# Patient Record
Sex: Female | Born: 1940 | Race: White | Hispanic: No | Marital: Married | Smoking: Never smoker
Health system: Southern US, Community
[De-identification: ages and names within clinical notes are randomized; demographics above are authoritative.]

---

## 2012-05-06 ENCOUNTER — Other Ambulatory Visit: Payer: Self-pay | Admitting: Family Medicine

## 2012-05-06 DIAGNOSIS — Z1231 Encounter for screening mammogram for malignant neoplasm of breast: Secondary | ICD-10-CM

## 2012-05-27 ENCOUNTER — Ambulatory Visit
Admission: RE | Admit: 2012-05-27 | Discharge: 2012-05-27 | Disposition: A | Discharge status: Home / Self Care | Payer: Medicare Other | Source: Ambulatory Visit | Attending: Family Medicine | Admitting: Family Medicine

## 2012-05-27 DIAGNOSIS — Z1231 Encounter for screening mammogram for malignant neoplasm of breast: Secondary | ICD-10-CM

## 2013-05-29 ENCOUNTER — Other Ambulatory Visit: Payer: Self-pay

## 2013-05-29 DIAGNOSIS — Z1231 Encounter for screening mammogram for malignant neoplasm of breast: Secondary | ICD-10-CM

## 2013-06-04 ENCOUNTER — Ambulatory Visit
Admission: RE | Admit: 2013-06-04 | Discharge: 2013-06-04 | Disposition: A | Discharge status: Home / Self Care | Payer: Medicare Other | Source: Ambulatory Visit

## 2013-06-04 DIAGNOSIS — Z1231 Encounter for screening mammogram for malignant neoplasm of breast: Secondary | ICD-10-CM

## 2015-12-19 ENCOUNTER — Other Ambulatory Visit: Payer: Self-pay | Admitting: Family Medicine

## 2015-12-19 ENCOUNTER — Other Ambulatory Visit: Payer: Self-pay

## 2015-12-19 DIAGNOSIS — Z1231 Encounter for screening mammogram for malignant neoplasm of breast: Secondary | ICD-10-CM

## 2015-12-28 ENCOUNTER — Ambulatory Visit
Admission: RE | Admit: 2015-12-28 | Discharge: 2015-12-28 | Disposition: A | Discharge status: Home / Self Care | Payer: Medicare Other | Source: Ambulatory Visit | Attending: Family Medicine | Admitting: Family Medicine

## 2015-12-28 DIAGNOSIS — Z1231 Encounter for screening mammogram for malignant neoplasm of breast: Secondary | ICD-10-CM

## 2016-11-12 ENCOUNTER — Other Ambulatory Visit: Payer: Self-pay | Admitting: Family Medicine

## 2016-11-12 DIAGNOSIS — M818 Other osteoporosis without current pathological fracture: Secondary | ICD-10-CM

## 2016-11-22 ENCOUNTER — Ambulatory Visit
Admission: RE | Admit: 2016-11-22 | Discharge: 2016-11-22 | Disposition: A | Discharge status: Home / Self Care | Payer: Medicare Other | Source: Ambulatory Visit | Attending: Family Medicine | Admitting: Family Medicine

## 2016-11-22 DIAGNOSIS — M818 Other osteoporosis without current pathological fracture: Secondary | ICD-10-CM

## 2019-07-10 ENCOUNTER — Other Ambulatory Visit: Payer: Self-pay | Admitting: Family Medicine

## 2019-07-10 DIAGNOSIS — Z1231 Encounter for screening mammogram for malignant neoplasm of breast: Secondary | ICD-10-CM

## 2019-07-21 ENCOUNTER — Other Ambulatory Visit: Payer: Self-pay | Admitting: Family Medicine

## 2019-07-21 DIAGNOSIS — M8580 Other specified disorders of bone density and structure, unspecified site: Secondary | ICD-10-CM

## 2019-08-24 ENCOUNTER — Other Ambulatory Visit: Payer: Self-pay | Admitting: Family Medicine

## 2019-08-24 DIAGNOSIS — M8000XA Age-related osteoporosis with current pathological fracture, unspecified site, initial encounter for fracture: Secondary | ICD-10-CM

## 2019-08-26 ENCOUNTER — Ambulatory Visit
Admission: RE | Admit: 2019-08-26 | Discharge: 2019-08-26 | Disposition: A | Discharge status: Home / Self Care | Payer: Medicare Other | Source: Ambulatory Visit | Attending: Family Medicine | Admitting: Family Medicine

## 2019-08-26 ENCOUNTER — Other Ambulatory Visit: Payer: Self-pay

## 2019-08-26 DIAGNOSIS — Z1231 Encounter for screening mammogram for malignant neoplasm of breast: Secondary | ICD-10-CM

## 2019-08-26 DIAGNOSIS — M8000XA Age-related osteoporosis with current pathological fracture, unspecified site, initial encounter for fracture: Secondary | ICD-10-CM

## 2020-01-22 ENCOUNTER — Ambulatory Visit: Payer: Self-pay | Attending: Internal Medicine

## 2020-01-22 ENCOUNTER — Other Ambulatory Visit: Payer: Self-pay

## 2020-01-22 DIAGNOSIS — Z23 Encounter for immunization: Secondary | ICD-10-CM | POA: Insufficient documentation

## 2020-01-22 NOTE — Progress Notes (Signed)
   Covid-19 Vaccination Clinic  Name:  Pamela Johnston    MRN: [id number] DOB: 02-09-1941  01/22/2020  [name] was observed post Covid-19 immunization for 15 minutes without incidence. She was provided with Vaccine Information Sheet and instruction to access the V-Safe system.   [name] was instructed to call 911 with any severe reactions post vaccine: Marland Kitchen Difficulty breathing  . Swelling of your face and throat  . A fast heartbeat  . A bad rash all over your body  . Dizziness and weakness    Immunizations Administered    Name Date Dose VIS Date Route   Pfizer COVID-19 Vaccine 01/22/2020  2:34 PM 0.3 mL 11/20/2019 Intramuscular   Manufacturer: [employer], [employer]   Lot: [id number]   NDC: [address]-0

## 2020-02-13 ENCOUNTER — Ambulatory Visit: Payer: Medicare PPO | Attending: Internal Medicine

## 2020-02-13 DIAGNOSIS — Z23 Encounter for immunization: Secondary | ICD-10-CM | POA: Insufficient documentation

## 2020-02-13 NOTE — Progress Notes (Signed)
   Covid-19 Vaccination Clinic  Name:  Pamela Johnston    MRN: [id number] DOB: 1941/05/16  02/13/2020  Pamela Johnston was observed post Covid-19 immunization for 15 minutes without incident. She was provided with Vaccine Information Sheet and instruction to access the V-Safe system.   Pamela Johnston was instructed to call 911 with any severe reactions post vaccine: Marland Kitchen Difficulty breathing  . Swelling of face and throat  . A fast heartbeat  . A bad rash all over body  . Dizziness and weakness   Immunizations Administered    Name Date Dose VIS Date Route   Pfizer COVID-19 Vaccine 02/13/2020  3:19 PM 0.3 mL 11/20/2019 Intramuscular   Manufacturer: [employer], [employer]   Lot: [id number]   NDC: [address]-3

## 2020-02-14 ENCOUNTER — Ambulatory Visit: Payer: Self-pay

## 2020-07-08 IMAGING — MG MM DIGITAL SCREENING BILAT W/ TOMO W/ CAD
8 series · 8 of 24 positions shown · non-contrast
Comparison: Previous exam(s).

CLINICAL DATA: Screening.

EXAM:
DIGITAL SCREENING BILATERAL MAMMOGRAM WITH TOMO AND CAD

[R MLO synth-2D]
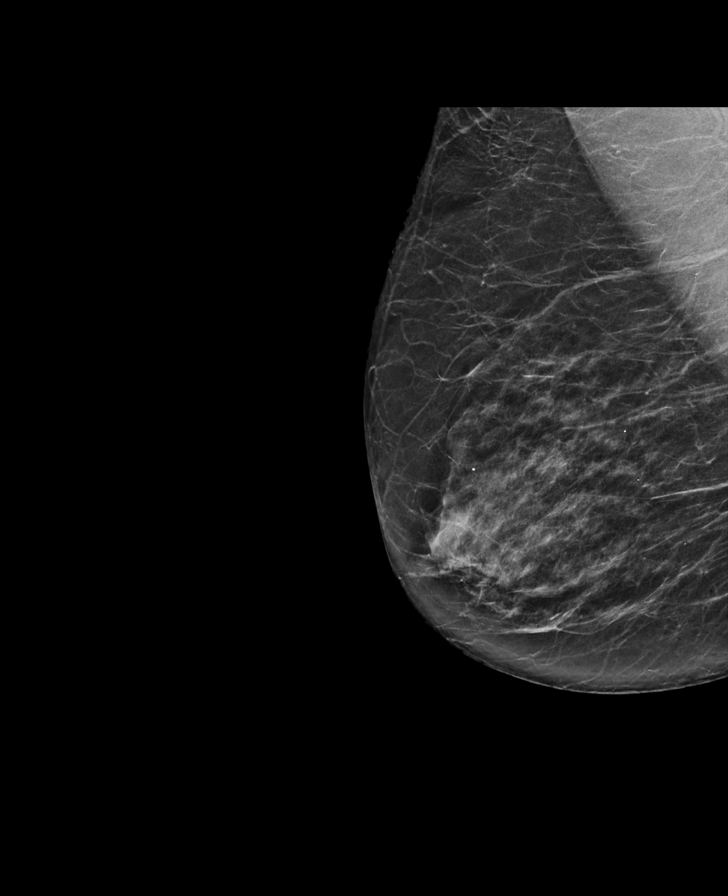

[L MLO synth-2D]
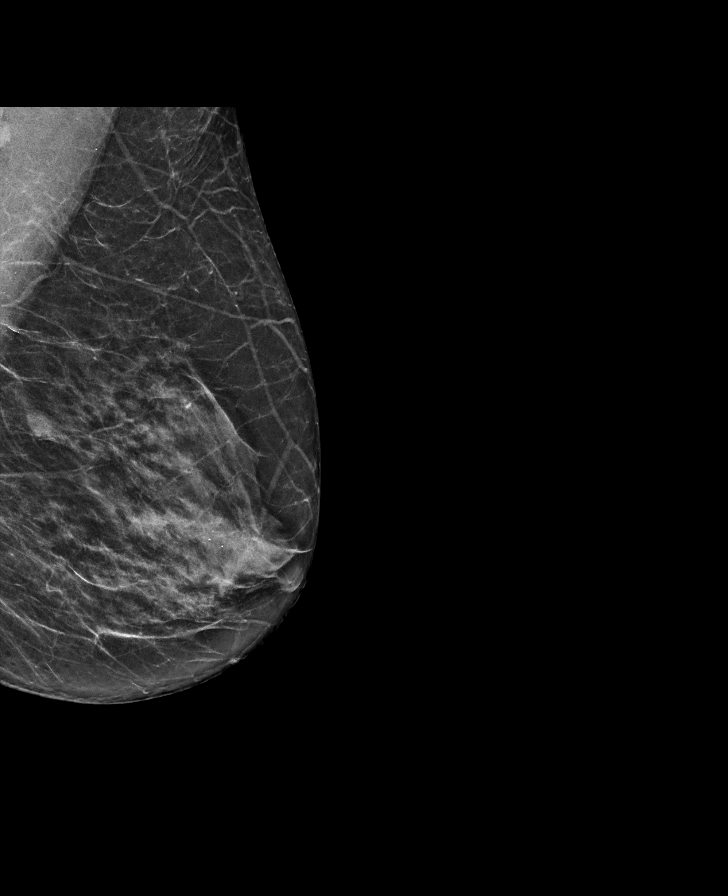

[L CC synth-2D]
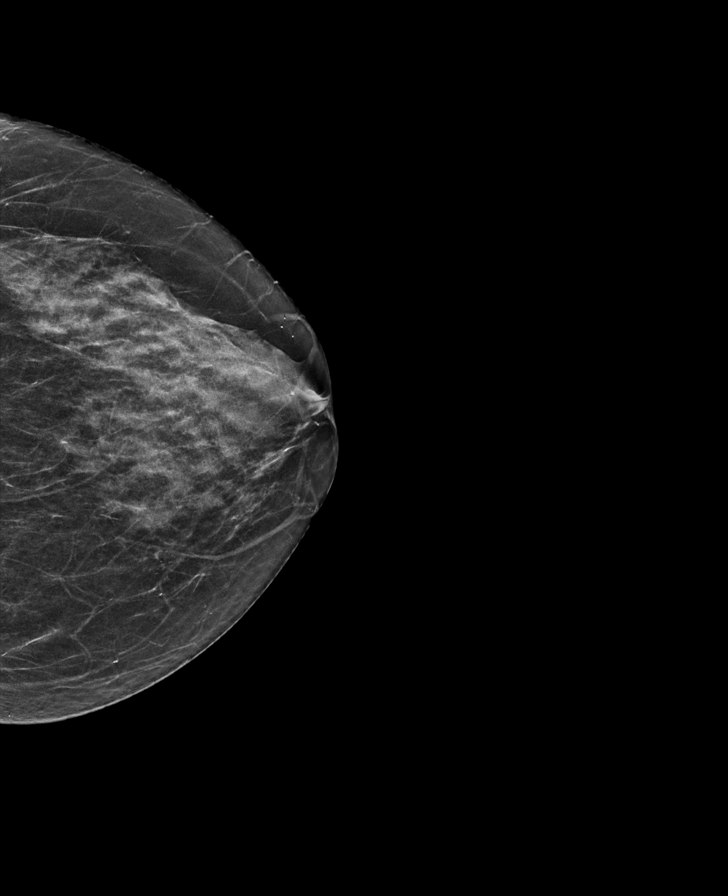

[R CC synth-2D]
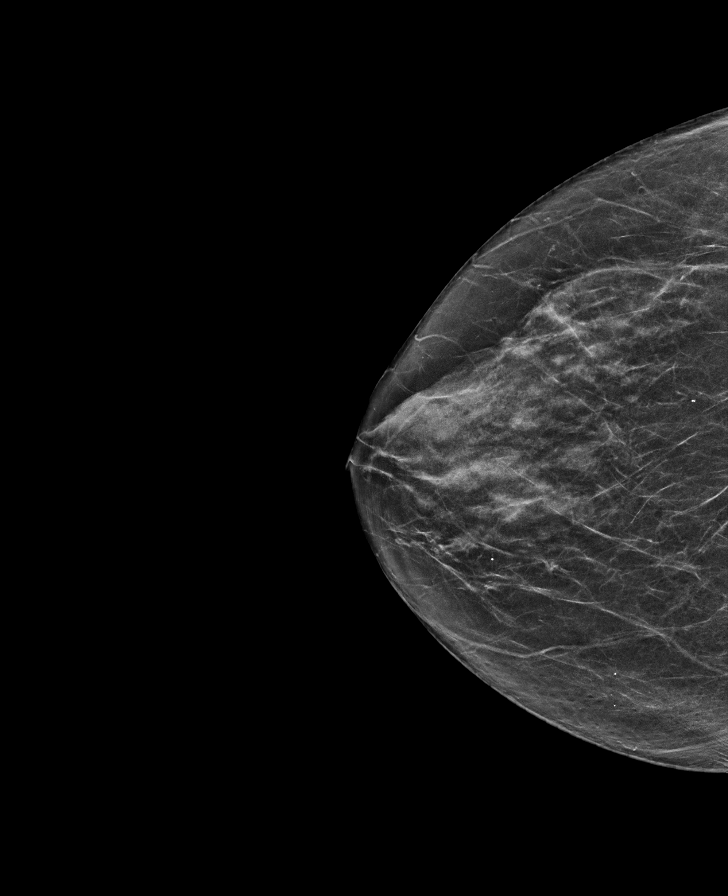

[L MLO tomo · tomo slice 30/59.0]
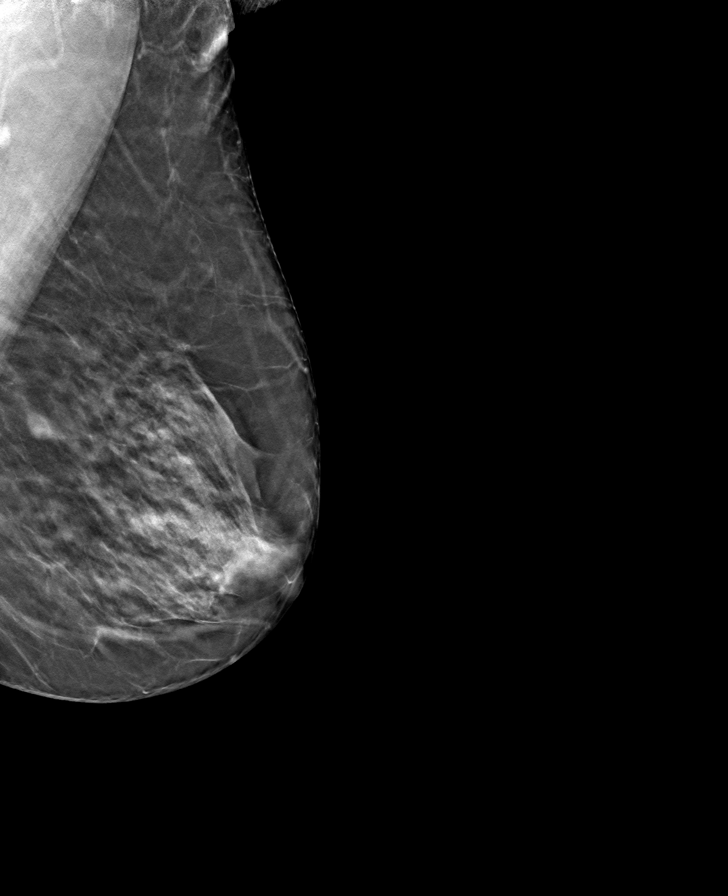

[L CC tomo · tomo slice 25/50.0]
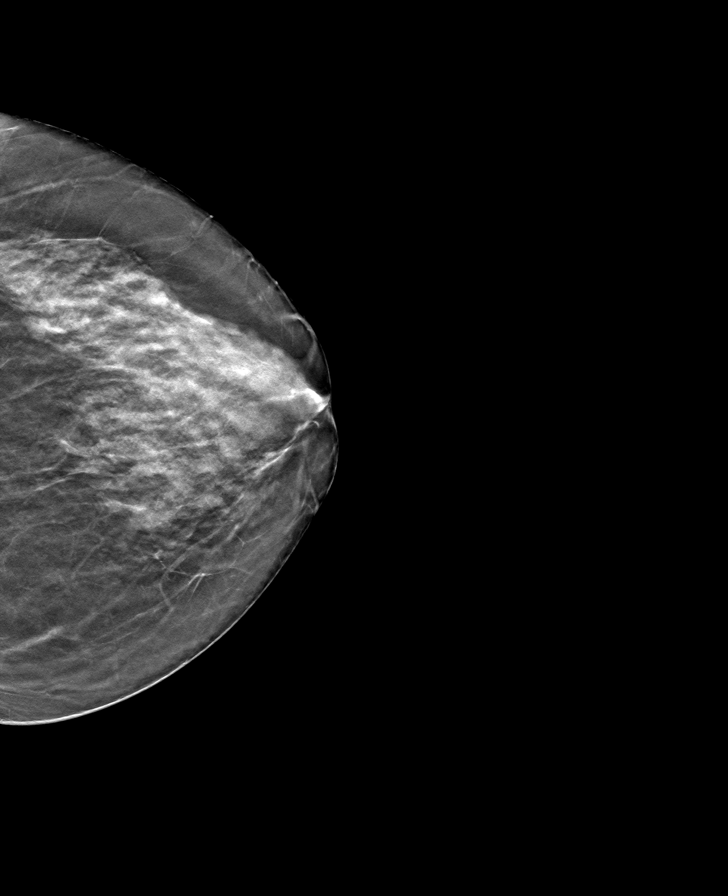

[R CC tomo · tomo slice 31/61.0]
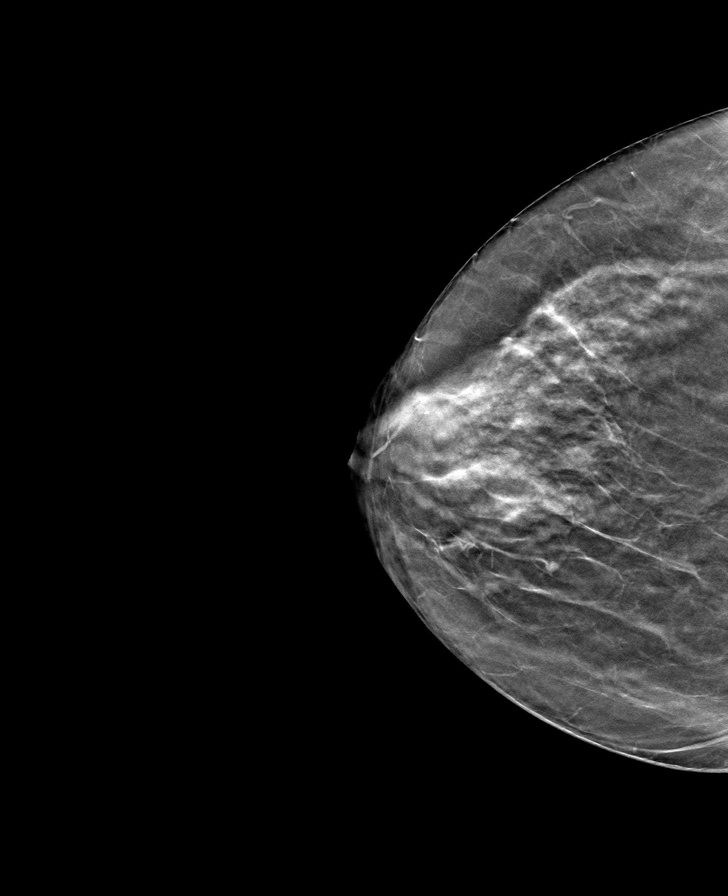

[R MLO tomo · tomo slice 31/62.0]
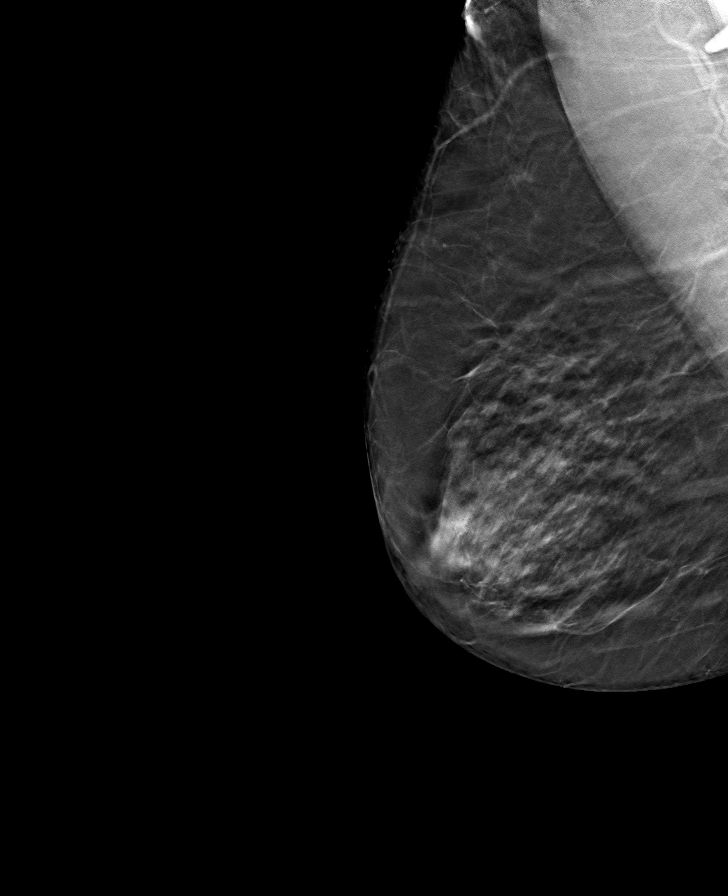

[8 of 24 positions shown; findings below may reference images not displayed]

ACR Breast Density Category c: The breast tissue is heterogeneously
dense, which may obscure small masses.
FINDINGS: There are no findings suspicious for malignancy. Images were
processed with CAD.
IMPRESSION: No mammographic evidence of malignancy. A result letter of this
screening mammogram will be mailed directly to the patient.

RECOMMENDATION:
Screening mammogram in one year. (Code:[id number])

BI-RADS CATEGORY  1: Negative.

## 2022-10-24 ENCOUNTER — Other Ambulatory Visit: Payer: Self-pay | Admitting: Family Medicine

## 2022-10-24 DIAGNOSIS — Z1231 Encounter for screening mammogram for malignant neoplasm of breast: Secondary | ICD-10-CM

## 2022-10-24 DIAGNOSIS — M8580 Other specified disorders of bone density and structure, unspecified site: Secondary | ICD-10-CM

## 2022-11-29 ENCOUNTER — Inpatient Hospital Stay (HOSPITAL_COMMUNITY)
Admission: EM | Admit: 2022-11-29 | Discharge: 2023-02-27 | DRG: 004 | Disposition: A | Discharge status: Skilled Nursing Facility | Payer: Medicare PPO | Attending: Internal Medicine | Admitting: Internal Medicine

## 2022-11-29 ENCOUNTER — Emergency Department (HOSPITAL_COMMUNITY): Payer: Medicare PPO

## 2022-11-29 ENCOUNTER — Encounter (HOSPITAL_COMMUNITY): Payer: Self-pay | Admitting: Neurology

## 2022-11-29 ENCOUNTER — Inpatient Hospital Stay (HOSPITAL_COMMUNITY): Payer: Medicare PPO

## 2022-11-29 DIAGNOSIS — Y848 Other medical procedures as the cause of abnormal reaction of the patient, or of later complication, without mention of misadventure at the time of the procedure: Secondary | ICD-10-CM | POA: Diagnosis not present

## 2022-11-29 DIAGNOSIS — R54 Age-related physical debility: Secondary | ICD-10-CM | POA: Diagnosis present

## 2022-11-29 DIAGNOSIS — Z1613 Resistance to carbapenem: Secondary | ICD-10-CM | POA: Diagnosis not present

## 2022-11-29 DIAGNOSIS — R509 Fever, unspecified: Secondary | ICD-10-CM | POA: Diagnosis not present

## 2022-11-29 DIAGNOSIS — I615 Nontraumatic intracerebral hemorrhage, intraventricular: Secondary | ICD-10-CM | POA: Diagnosis present

## 2022-11-29 DIAGNOSIS — I611 Nontraumatic intracerebral hemorrhage in hemisphere, cortical: Secondary | ICD-10-CM | POA: Diagnosis not present

## 2022-11-29 DIAGNOSIS — R29722 NIHSS score 22: Secondary | ICD-10-CM | POA: Diagnosis present

## 2022-11-29 DIAGNOSIS — M6250 Muscle wasting and atrophy, not elsewhere classified, unspecified site: Secondary | ICD-10-CM | POA: Diagnosis not present

## 2022-11-29 DIAGNOSIS — J150 Pneumonia due to Klebsiella pneumoniae: Secondary | ICD-10-CM | POA: Diagnosis not present

## 2022-11-29 DIAGNOSIS — Y95 Nosocomial condition: Secondary | ICD-10-CM | POA: Diagnosis not present

## 2022-11-29 DIAGNOSIS — I618 Other nontraumatic intracerebral hemorrhage: Secondary | ICD-10-CM | POA: Diagnosis not present

## 2022-11-29 DIAGNOSIS — E8770 Fluid overload, unspecified: Secondary | ICD-10-CM | POA: Diagnosis not present

## 2022-11-29 DIAGNOSIS — B9561 Methicillin susceptible Staphylococcus aureus infection as the cause of diseases classified elsewhere: Secondary | ICD-10-CM | POA: Diagnosis present

## 2022-11-29 DIAGNOSIS — I612 Nontraumatic intracerebral hemorrhage in hemisphere, unspecified: Secondary | ICD-10-CM | POA: Diagnosis not present

## 2022-11-29 DIAGNOSIS — R739 Hyperglycemia, unspecified: Secondary | ICD-10-CM | POA: Diagnosis not present

## 2022-11-29 DIAGNOSIS — G935 Compression of brain: Secondary | ICD-10-CM | POA: Diagnosis present

## 2022-11-29 DIAGNOSIS — J9611 Chronic respiratory failure with hypoxia: Secondary | ICD-10-CM | POA: Diagnosis not present

## 2022-11-29 DIAGNOSIS — G936 Cerebral edema: Secondary | ICD-10-CM | POA: Diagnosis present

## 2022-11-29 DIAGNOSIS — D6489 Other specified anemias: Secondary | ICD-10-CM | POA: Diagnosis not present

## 2022-11-29 DIAGNOSIS — Z9911 Dependence on respirator [ventilator] status: Secondary | ICD-10-CM

## 2022-11-29 DIAGNOSIS — I6389 Other cerebral infarction: Secondary | ICD-10-CM | POA: Diagnosis not present

## 2022-11-29 DIAGNOSIS — M8580 Other specified disorders of bone density and structure, unspecified site: Secondary | ICD-10-CM | POA: Insufficient documentation

## 2022-11-29 DIAGNOSIS — M461 Sacroiliitis, not elsewhere classified: Secondary | ICD-10-CM

## 2022-11-29 DIAGNOSIS — L89512 Pressure ulcer of right ankle, stage 2: Secondary | ICD-10-CM | POA: Diagnosis not present

## 2022-11-29 DIAGNOSIS — Z751 Person awaiting admission to adequate facility elsewhere: Secondary | ICD-10-CM

## 2022-11-29 DIAGNOSIS — J9811 Atelectasis: Secondary | ICD-10-CM | POA: Diagnosis not present

## 2022-11-29 DIAGNOSIS — Z931 Gastrostomy status: Secondary | ICD-10-CM

## 2022-11-29 DIAGNOSIS — J90 Pleural effusion, not elsewhere classified: Secondary | ICD-10-CM | POA: Diagnosis not present

## 2022-11-29 DIAGNOSIS — T17908A Unspecified foreign body in respiratory tract, part unspecified causing other injury, initial encounter: Secondary | ICD-10-CM

## 2022-11-29 DIAGNOSIS — I619 Nontraumatic intracerebral hemorrhage, unspecified: Principal | ICD-10-CM

## 2022-11-29 DIAGNOSIS — R0902 Hypoxemia: Secondary | ICD-10-CM

## 2022-11-29 DIAGNOSIS — R579 Shock, unspecified: Secondary | ICD-10-CM | POA: Diagnosis not present

## 2022-11-29 DIAGNOSIS — R1310 Dysphagia, unspecified: Secondary | ICD-10-CM | POA: Diagnosis not present

## 2022-11-29 DIAGNOSIS — G9349 Other encephalopathy: Secondary | ICD-10-CM | POA: Diagnosis not present

## 2022-11-29 DIAGNOSIS — R403 Persistent vegetative state: Secondary | ICD-10-CM | POA: Diagnosis not present

## 2022-11-29 DIAGNOSIS — Z888 Allergy status to other drugs, medicaments and biological substances status: Secondary | ICD-10-CM

## 2022-11-29 DIAGNOSIS — I10 Essential (primary) hypertension: Secondary | ICD-10-CM | POA: Insufficient documentation

## 2022-11-29 DIAGNOSIS — Z930 Tracheostomy status: Secondary | ICD-10-CM

## 2022-11-29 DIAGNOSIS — Z515 Encounter for palliative care: Secondary | ICD-10-CM | POA: Diagnosis not present

## 2022-11-29 DIAGNOSIS — R251 Tremor, unspecified: Secondary | ICD-10-CM | POA: Diagnosis not present

## 2022-11-29 DIAGNOSIS — G2581 Restless legs syndrome: Secondary | ICD-10-CM | POA: Diagnosis present

## 2022-11-29 DIAGNOSIS — I471 Supraventricular tachycardia, unspecified: Secondary | ICD-10-CM | POA: Diagnosis not present

## 2022-11-29 DIAGNOSIS — Z2239 Carrier of other specified bacterial diseases: Secondary | ICD-10-CM

## 2022-11-29 DIAGNOSIS — L8990 Pressure ulcer of unspecified site, unspecified stage: Secondary | ICD-10-CM | POA: Insufficient documentation

## 2022-11-29 DIAGNOSIS — Z1624 Resistance to multiple antibiotics: Secondary | ICD-10-CM | POA: Diagnosis not present

## 2022-11-29 DIAGNOSIS — E871 Hypo-osmolality and hyponatremia: Secondary | ICD-10-CM | POA: Diagnosis not present

## 2022-11-29 DIAGNOSIS — I616 Nontraumatic intracerebral hemorrhage, multiple localized: Secondary | ICD-10-CM | POA: Diagnosis not present

## 2022-11-29 DIAGNOSIS — L89891 Pressure ulcer of other site, stage 1: Secondary | ICD-10-CM | POA: Diagnosis not present

## 2022-11-29 DIAGNOSIS — J189 Pneumonia, unspecified organism: Secondary | ICD-10-CM | POA: Diagnosis not present

## 2022-11-29 DIAGNOSIS — Z880 Allergy status to penicillin: Secondary | ICD-10-CM

## 2022-11-29 DIAGNOSIS — L89150 Pressure ulcer of sacral region, unstageable: Secondary | ICD-10-CM | POA: Diagnosis not present

## 2022-11-29 DIAGNOSIS — J95851 Ventilator associated pneumonia: Secondary | ICD-10-CM | POA: Diagnosis not present

## 2022-11-29 DIAGNOSIS — R4701 Aphasia: Secondary | ICD-10-CM | POA: Diagnosis present

## 2022-11-29 DIAGNOSIS — G919 Hydrocephalus, unspecified: Secondary | ICD-10-CM | POA: Diagnosis present

## 2022-11-29 DIAGNOSIS — G40909 Epilepsy, unspecified, not intractable, without status epilepticus: Secondary | ICD-10-CM | POA: Diagnosis not present

## 2022-11-29 DIAGNOSIS — E785 Hyperlipidemia, unspecified: Secondary | ICD-10-CM | POA: Diagnosis present

## 2022-11-29 DIAGNOSIS — E876 Hypokalemia: Secondary | ICD-10-CM | POA: Diagnosis not present

## 2022-11-29 DIAGNOSIS — G8191 Hemiplegia, unspecified affecting right dominant side: Secondary | ICD-10-CM | POA: Diagnosis present

## 2022-11-29 DIAGNOSIS — J690 Pneumonitis due to inhalation of food and vomit: Secondary | ICD-10-CM | POA: Diagnosis present

## 2022-11-29 DIAGNOSIS — Z7189 Other specified counseling: Secondary | ICD-10-CM | POA: Diagnosis not present

## 2022-11-29 DIAGNOSIS — N390 Urinary tract infection, site not specified: Secondary | ICD-10-CM | POA: Diagnosis not present

## 2022-11-29 DIAGNOSIS — R609 Edema, unspecified: Secondary | ICD-10-CM | POA: Diagnosis not present

## 2022-11-29 DIAGNOSIS — J1569 Pneumonia due to other gram-negative bacteria: Secondary | ICD-10-CM | POA: Diagnosis not present

## 2022-11-29 DIAGNOSIS — L89812 Pressure ulcer of head, stage 2: Secondary | ICD-10-CM | POA: Diagnosis not present

## 2022-11-29 DIAGNOSIS — J9601 Acute respiratory failure with hypoxia: Secondary | ICD-10-CM | POA: Diagnosis not present

## 2022-11-29 DIAGNOSIS — R258 Other abnormal involuntary movements: Secondary | ICD-10-CM | POA: Diagnosis not present

## 2022-11-29 DIAGNOSIS — Z79899 Other long term (current) drug therapy: Secondary | ICD-10-CM

## 2022-11-29 DIAGNOSIS — R569 Unspecified convulsions: Secondary | ICD-10-CM | POA: Diagnosis not present

## 2022-11-29 HISTORY — DX: Sacroiliitis, not elsewhere classified: M46.1

## 2022-11-29 HISTORY — DX: Other specified disorders of bone density and structure, unspecified site: M85.80

## 2022-11-29 HISTORY — DX: Essential (primary) hypertension: I10

## 2022-11-29 HISTORY — DX: Restless legs syndrome: G25.81

## 2022-11-29 LAB — CBC
HCT: 40.4 % (ref 36.0–46.0)
Hemoglobin: 13.9 g/dL (ref 12.0–15.0)
MCH: 32 pg (ref 26.0–34.0)
MCHC: 34.4 g/dL (ref 30.0–36.0)
MCV: 92.9 fL (ref 80.0–100.0)
Platelets: 216 10*3/uL (ref 150–400)
RBC: 4.35 MIL/uL (ref 3.87–5.11)
RDW: 12.1 % (ref 11.5–15.5)
WBC: 12 10*3/uL — ABNORMAL HIGH (ref 4.0–10.5)
nRBC: 0 % (ref 0.0–0.2)

## 2022-11-29 LAB — COMPREHENSIVE METABOLIC PANEL
ALT: 16 U/L (ref 0–44)
AST: 40 U/L (ref 15–41)
Albumin: 4.3 g/dL (ref 3.5–5.0)
Alkaline Phosphatase: 59 U/L (ref 38–126)
Anion gap: 14 (ref 5–15)
BUN: 12 mg/dL (ref 8–23)
CO2: 21 mmol/L — ABNORMAL LOW (ref 22–32)
Calcium: 9.8 mg/dL (ref 8.9–10.3)
Chloride: 100 mmol/L (ref 98–111)
Creatinine, Ser: 0.96 mg/dL (ref 0.44–1.00)
GFR, Estimated: >60 mL/min (ref >60)
Glucose, Bld: 154 mg/dL — ABNORMAL HIGH (ref 70–99)
Potassium: 3.5 mmol/L (ref 3.5–5.1)
Sodium: 135 mmol/L (ref 135–145)
Total Bilirubin: 0.3 mg/dL (ref 0.3–1.2)
Total Protein: 7.4 g/dL (ref 6.5–8.1)

## 2022-11-29 LAB — I-STAT CHEM 8, ED
BUN: 13 mg/dL (ref 8–23)
Calcium, Ion: 1.12 mmol/L — ABNORMAL LOW (ref 1.15–1.40)
Chloride: 98 mmol/L (ref 98–111)
Creatinine, Ser: 0.8 mg/dL (ref 0.44–1.00)
Glucose, Bld: 153 mg/dL — ABNORMAL HIGH (ref 70–99)
HCT: 43 % (ref 36.0–46.0)
Hemoglobin: 14.6 g/dL (ref 12.0–15.0)
Potassium: 3.7 mmol/L (ref 3.5–5.1)
Sodium: 134 mmol/L — ABNORMAL LOW (ref 135–145)
TCO2: 22 mmol/L (ref 22–32)

## 2022-11-29 LAB — I-STAT ARTERIAL BLOOD GAS, ED
Acid-Base Excess: 1 mmol/L (ref 0.0–2.0)
Bicarbonate: 25.8 mmol/L (ref 20.0–28.0)
Calcium, Ion: 1.19 mmol/L (ref 1.15–1.40)
HCT: 41 % (ref 36.0–46.0)
Hemoglobin: 13.9 g/dL (ref 12.0–15.0)
O2 Saturation: 99 %
Patient temperature: 97.7
Potassium: 3.4 mmol/L — ABNORMAL LOW (ref 3.5–5.1)
Sodium: 134 mmol/L — ABNORMAL LOW (ref 135–145)
TCO2: 27 mmol/L (ref 22–32)
pCO2 arterial: 41.6 mmHg (ref 32–48)
pH, Arterial: 7.398 (ref 7.35–7.45)
pO2, Arterial: 152 mmHg — ABNORMAL HIGH (ref 83–108)

## 2022-11-29 LAB — DIFFERENTIAL
Abs Immature Granulocytes: 0.04 10*3/uL (ref 0.00–0.07)
Basophils Absolute: 0.1 10*3/uL (ref 0.0–0.1)
Basophils Relative: 1 %
Eosinophils Absolute: 0.3 10*3/uL (ref 0.0–0.5)
Eosinophils Relative: 2 %
Immature Granulocytes: 0 %
Lymphocytes Relative: 49 %
Lymphs Abs: 5.9 10*3/uL — ABNORMAL HIGH (ref 0.7–4.0)
Monocytes Absolute: 1 10*3/uL (ref 0.1–1.0)
Monocytes Relative: 8 %
Neutro Abs: 4.8 10*3/uL (ref 1.7–7.7)
Neutrophils Relative %: 40 %

## 2022-11-29 LAB — CBG MONITORING, ED: Glucose-Capillary: 169 mg/dL — ABNORMAL HIGH (ref 70–99)

## 2022-11-29 LAB — APTT: aPTT: 23 seconds — ABNORMAL LOW (ref 24–36)

## 2022-11-29 LAB — PROTIME-INR
INR: 1 (ref 0.8–1.2)
Prothrombin Time: 12.7 seconds (ref 11.4–15.2)

## 2022-11-29 LAB — ETHANOL: Alcohol, Ethyl (B): <10 mg/dL (ref <10)

## 2022-11-29 MED ORDER — ETOMIDATE 2 MG/ML IV SOLN
INTRAVENOUS | Status: AC
  Filled 2022-11-29: qty 20

## 2022-11-29 MED ORDER — STROKE: EARLY STAGES OF RECOVERY BOOK: Freq: Once | Status: AC

## 2022-11-29 MED ORDER — ROCURONIUM BROMIDE 50 MG/5ML IV SOLN
INTRAVENOUS | Status: DC | PRN
  Administered 2022-11-29: 100 mg via INTRAVENOUS

## 2022-11-29 MED ORDER — SENNOSIDES-DOCUSATE SODIUM 8.6-50 MG PO TABS: 1.0000 | ORAL_TABLET | Freq: Two times a day (BID) | ORAL | Status: DC

## 2022-11-29 MED ORDER — ETOMIDATE 2 MG/ML IV SOLN
INTRAVENOUS | Status: DC | PRN
  Administered 2022-11-29: 20 mg via INTRAVENOUS

## 2022-11-29 MED ORDER — ACETAMINOPHEN 325 MG PO TABS
650.0000 mg | ORAL_TABLET | ORAL | Status: DC | PRN
  Administered 2023-01-22 – 2023-02-18 (×7): 650 mg via ORAL
  Filled 2022-11-29 (×12): qty 2

## 2022-11-29 MED ORDER — ROCURONIUM BROMIDE 10 MG/ML (PF) SYRINGE
PREFILLED_SYRINGE | INTRAVENOUS | Status: AC
  Filled 2022-11-29: qty 10

## 2022-11-29 MED ORDER — SODIUM CHLORIDE 0.9 % IV SOLN
3.0000 g | Freq: Four times a day (QID) | INTRAVENOUS | Status: DC
  Administered 2022-11-30 – 2022-12-04 (×18): 3 g via INTRAVENOUS
  Filled 2022-11-29 (×18): qty 8

## 2022-11-29 MED ORDER — FENTANYL 2500MCG IN NS 250ML (10MCG/ML) PREMIX INFUSION
25.0000 ug/h | INTRAVENOUS | Status: DC
  Administered 2022-11-29 – 2022-11-30 (×2): 50 ug/h via INTRAVENOUS
  Administered 2022-12-04 – 2022-12-08 (×4): 25 ug/h via INTRAVENOUS
  Administered 2022-12-10 – 2022-12-12 (×2): 50 ug/h via INTRAVENOUS
  Filled 2022-11-29 (×6): qty 250

## 2022-11-29 MED ORDER — ACETAMINOPHEN 650 MG RE SUPP: 650.0000 mg | RECTAL | Status: DC | PRN

## 2022-11-29 MED ORDER — SODIUM CHLORIDE 0.9% FLUSH
3.0000 mL | Freq: Once | INTRAVENOUS | Status: AC
  Administered 2022-11-29: 3 mL via INTRAVENOUS

## 2022-11-29 MED ORDER — SODIUM CHLORIDE 0.9 % IV BOLUS
500.0000 mL | Freq: Once | INTRAVENOUS | Status: AC
  Administered 2022-11-29: 500 mL via INTRAVENOUS

## 2022-11-29 MED ORDER — CLEVIDIPINE BUTYRATE 0.5 MG/ML IV EMUL
0.0000 mg/h | INTRAVENOUS | Status: DC
  Administered 2022-11-29: 2 mg/h via INTRAVENOUS
  Filled 2022-11-29: qty 100

## 2022-11-29 MED ORDER — ONDANSETRON HCL 4 MG/2ML IJ SOLN
4.0000 mg | Freq: Once | INTRAMUSCULAR | Status: AC
  Administered 2022-11-29: 4 mg via INTRAVENOUS

## 2022-11-29 MED ORDER — PROPOFOL 1000 MG/100ML IV EMUL
0.0000 ug/kg/min | INTRAVENOUS | Status: DC
  Administered 2022-11-29: 15 ug/kg/min via INTRAVENOUS
  Filled 2022-11-29: qty 100

## 2022-11-29 MED ORDER — SODIUM CHLORIDE 3 % IV SOLN
INTRAVENOUS | Status: DC
  Filled 2022-11-29 (×8): qty 500

## 2022-11-29 MED ORDER — FENTANYL BOLUS VIA INFUSION
25.0000 ug | INTRAVENOUS | Status: DC | PRN
  Administered 2022-11-30: 100 ug via INTRAVENOUS
  Administered 2022-11-30: 50 ug via INTRAVENOUS
  Administered 2022-12-01 – 2022-12-06 (×2): 100 ug via INTRAVENOUS

## 2022-11-29 MED ORDER — IOHEXOL 350 MG/ML SOLN
75.0000 mL | Freq: Once | INTRAVENOUS | Status: AC | PRN
  Administered 2022-11-29: 75 mL via INTRAVENOUS

## 2022-11-29 MED ORDER — PANTOPRAZOLE SODIUM 40 MG IV SOLR
40.0000 mg | Freq: Every day | INTRAVENOUS | Status: DC
  Administered 2022-11-29 – 2022-12-03 (×5): 40 mg via INTRAVENOUS
  Filled 2022-11-29 (×5): qty 10

## 2022-11-29 MED ORDER — ACETAMINOPHEN 160 MG/5ML PO SOLN
650.0000 mg | ORAL | Status: DC | PRN
  Administered 2022-11-30 – 2023-02-13 (×72): 650 mg
  Filled 2022-11-29 (×71): qty 20.3

## 2022-11-29 MED ORDER — FENTANYL CITRATE PF 50 MCG/ML IJ SOSY
25.0000 ug | PREFILLED_SYRINGE | Freq: Once | INTRAMUSCULAR | Status: AC
  Administered 2022-11-29: 25 ug via INTRAVENOUS

## 2022-11-29 NOTE — H&P (Signed)
Neurology H&P  CC: Aphasia  History is obtained from:  HPI: Pamela Johnston is a 81 y.o. female who presents with acute onset right sided weakness and aphasia.  She was last seen well at 8 PM by her husband, and then subsequently found in bed with aphasia.  EMS was called who activated code stroke en route.  She was taken emergently to head CT which demonstrates a large intraparenchymal hematoma centered in the left parietal region. While in the ED, she had several episodes of emesis and began having labored breathing and therefore was intubated.    LKW: 8 PM tpa given?: No, ICH IR Thrombectomy? No, ICH Modified Rankin Scale: 0-Completely asymptomatic and back to baseline post- stroke NIHSS: 22 ICH score: 3   PMH:  RLS   FHX: Unable to assess secondary to patient's altered mental status.     Social History: Lives at home   Prior to Admission medications   Not on File     Exam: Current vital signs: BP (!) 170/110   Pulse (!) 108   Resp 14   Wt 57.8 kg   SpO2 100%    Physical Exam   Physical Exam  Constitutional: Appears well-developed and well-nourished.  Psych: Patient appears anxious prior to intubation Eyes: No scleral injection HENT: No OP obstrucion MSK: no joint deformities.  Cardiovascular: Mildly tachycardic with regular rhythm.  Respiratory: Effort normal, non-labored breathing GI: Soft.  No distension. There is no tenderness.  Skin: WDI  GCS: 10 E(4) V(1) M(5) Neuro: Mental Status: Patient is drowsy but easily arousable, she is densely aphasic does not follow commands or answer questions Cranial Nerves: II: No blink to threat from the right. Pupils are equal, round, and reactive to light.   III,IV, VI: Left gaze deviation VII: Facial movement with left facial weakness Motor: She has extensor posturing in the right upper extremity, flexor posturing of the right lower extremity, moves both the left arm and leg well spontaneously Sensory: She  seems to respond less on the right than left Cerebellar: Does not perform  I have reviewed labs in epic and the pertinent results are: CMP - mildly elevated glucose,    I have reviewed the images obtained:CT head - large Left IPH   Primary Diagnosis:  Nontraumatic intracerebral hemorrhage in hemisphere, cortical  Secondary Diagnosis: Brain compression   Impression: 81 yo F with massive ICH in the left paarietal region(180 mL).  This is a devastating stroke, and if she for arrives would likely be debilitated.  This was discussed with the family, at the current time she is full code and neurosurgery has been consulted.  Unfortunately, given the extent of her bleeding, neurosurgery does not feel that she would benefit from any type of intervention at this time.  Plan: 1) Admit to ICU 2) no antiplatelets or anticoagulants 3) blood pressure control with goal systolic 130 - 150 4) Frequent neuro checks 5) If symptoms worsen or there is decreased mental status, repeat stat head CT 6) PT,OT,ST 7) appreciate neurosurgery consultation 8) CCM consult for ventilator management 9) repeat CT in a.m.    This patient is critically ill and at significant risk of neurological worsening, death and care requires constant monitoring of vital signs, hemodynamics,respiratory and cardiac monitoring, neurological assessment, discussion with family, other specialists and medical decision making of high complexity. I spent 65 minutes of neurocritical care time  in the care of  this patient. This was time spent independent of any time provided by  nurse practitioner or PA.  Ritta Slot, MD [facility] [phone]  If 7pm- 7am, please page neurology on call as listed in AMION.

## 2022-11-29 NOTE — ED Notes (Signed)
Spoke to Dr. Amada Jupiter concerning pt not being within BP range despite not having any cleviprex or propofol for past 30 min. Orders given for 500cc bolus.

## 2022-11-29 NOTE — Consult Note (Signed)
NAME:  Pamela Johnston, MRN:  [id number], DOB:  Dec 11, 1940, LOS: 1 ADMISSION DATE:  11/29/2022 CONSULTATION DATE:  11/29/2022 REFERRING MD:  Amada Jupiter - Neuro CHIEF COMPLAINT:  IPH, vent management   History of Present Illness:  81-yer-old woman who presented to [facility] ED 12/21 as a Code Stroke. LKW 2000 when patient reportedly was in bed and became less responsive with +aphasia and R-sided weakness. One episode of nausea/vomiting en route with EMS. PMHx significant for HTN, chronic sacroiliitis (managed with Percocet), RLS, osteopenia.  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 Labs were grossly unremarkable with WBC mildly elevated to 12 and mild hyperglycemia to 154. Decision was made to intubate patient in ED after several episodes of emesis with concern for aspiration. HTS 3% was initiated.  Stroke service to admit. NSGY consulted with no plan for intervention at this time.  PCCM consulted for ventilator/hemodynamic management.   Pertinent Medical History:   Past Medical History:  Diagnosis Date   Benign essential HTN 11/29/2022   Osteopenia 11/29/2022   RLS (restless legs syndrome) 11/29/2022   Sacroiliitis (HCC) 11/29/2022   Significant Hospital Events: Including procedures, antibiotic start and stop dates in addition to other pertinent events   12/21 - Presented to [facility] ED via EMS as Code Stroke. LKW 2000. Abrupt onset of decreased responsiveness, aphasia and R hemiplegia. CT Head with large L hemispheric parietal/temporal lobe IPH with extension into basal ganglia and IVH. HTS 3% started. Stroke admitting, NSGY consulted. PCCM consult for vent management.  Interim History / Subjective:  PCCM consulted for ventilator management  Objective:  Blood pressure 126/67, pulse  97, temperature 98.3 F (36.8 C), resp. rate 17, height 5\' 1"  (1.549 m), weight 57.8 kg, SpO2 94 %.    Vent Mode: PRVC FiO2 (%):  [70 %-100 %] 70 % Set Rate:  [16 bmp] 16 bmp Vt Set:  [330 mL] 330 mL PEEP:  [5 cmH20] 5 cmH20 Plateau Pressure:  [12 cmH20] 12 cmH20   Intake/Output Summary (Last 24 hours) at 11/30/2022 0208 Last data filed at 11/30/2022 0037 Gross per 24 hour  Intake 500 ml  Output 150 ml  Net 350 ml   Filed Weights   11/29/22 2100  Weight: 57.8 kg   Physical Examination: General: Acutely ill-appearing elderly woman in NAD. Intubated, sedated. HEENT: [place]/AT, anicteric sclera, PERRL 21mm, moist mucous membranes. ETT/OGT in place. Neuro: Sedated. Does not respond to verbal, tactile or noxious stimuli. Does not withdraw to pain. Not following commands. No spontaneous movement of extremities noted. No cough/gag. CV: RRR, no m/g/r. PULM: Breathing even and unlabored on vent (PEEP 5, FiO2 70%). Lung fields diminished at bilateral bases. GI: Soft, nontender, nondistended. Normoactive bowel sounds. Extremities: No LE edema noted. Skin: Warm/dry, no rashes.  Resolved Hospital Problem List:    Assessment & Plan:  Large left hemispheric parietal/temporal IPH with vasogenic edema/mass effect Brain compression CT Head 12/21 with large IPH of L parietal/L temporal regions with extension into basal ganglia, secondary dissection with IVH and surrounding vasogenic edema with regional mass effect.  - Management per Stroke team (primary) - Goal SBP 130-150 - Cleviprex titrated to goal SBP - HTS 3% titrated to Na 150-155 - Additional brain imaging per Neuro - Neuroprotective measures: HOB > 30 degrees, normoglycemia, normothermia, electrolytes WNL   Acute hypoxemic  respiratory failure in the setting of IPH/IVH Concern for aspiration PNA - Continue full vent support (4-8cc/kg IBW) - Wean FiO2 for O2 sat > 90% - Daily WUA/SBT when appropriate from a mental status standpoint -  VAP bundle - Pulmonary hygiene - PAD protocol for sedation: Propofol and Fentanyl for goal RASS 0 to -1 - Unasyn for ?aspiration coverage, given emesis peri-intubation - Follow CXR - F/u post-vent ABG  RLS Chronic sacroiliitis Osteopenia - Hold home Mirapex, Percocet  Best Practice: (right click and "Reselect all SmartList Selections" daily)   Diet/type: NPO w/ meds via tube DVT prophylaxis: SCDs, pharmacologic ppx contraindicated in the setting of IPH GI prophylaxis: PPI Lines: N/A, may need central access for ongoing HTS Foley:  Yes, and it is still needed Code Status:  full code Last date of multidisciplinary goals of care discussion [facility] DNR per family 12/22 early AM]  Labs:  CBC: Recent Labs  Lab 11/29/22 2147 11/29/22 2150 11/29/22 2324  WBC 12.0*  --   --   NEUTROABS 4.8  --   --   HGB 13.9 14.6 13.9  HCT 40.4 43.0 41.0  MCV 92.9  --   --   PLT 216  --   --    Basic Metabolic Panel: Recent Labs  Lab 11/29/22 2147 11/29/22 2150 11/29/22 2320 11/29/22 2324  NA 135 134* 134* 134*  K 3.5 3.7  --  3.4*  CL 100 98  --   --   CO2 21*  --   --   --   GLUCOSE 154* 153*  --   --   BUN 12 13  --   --   CREATININE 0.96 0.80  --   --   CALCIUM 9.8  --   --   --    GFR: Estimated Creatinine Clearance: 46.6 mL/min (by C-G formula based on SCr of 0.8 mg/dL). Recent Labs  Lab 11/29/22 2147  WBC 12.0*   Liver Function Tests: Recent Labs  Lab 11/29/22 2147  AST 40  ALT 16  ALKPHOS 59  BILITOT 0.3  PROT 7.4  ALBUMIN 4.3   No results for input(s): "LIPASE", "AMYLASE" in the last 168 hours. No results for input(s): "AMMONIA" in the last 168 hours.  ABG:    Component Value Date/Time   PHART 7.398 11/29/2022 2324   PCO2ART 41.6 11/29/2022 2324   PO2ART 152 (H) 11/29/2022 2324   HCO3 25.8 11/29/2022 2324   TCO2 27 11/29/2022 2324   O2SAT 99 11/29/2022 2324    Coagulation Profile: Recent Labs  Lab 11/29/22 2147  INR 1.0   Cardiac Enzymes: No  results for input(s): "CKTOTAL", "CKMB", "CKMBINDEX", "TROPONINI" in the last 168 hours.  HbA1C: No results found for: "HGBA1C"  CBG: Recent Labs  Lab 11/29/22 2147  GLUCAP 169*   Review of Systems:   Patient is encephalopathic and/or intubated. Therefore history has been obtained from chart review.   Past Medical History:  She,  has a past medical history of Benign essential HTN (11/29/2022), Osteopenia (11/29/2022), RLS (restless legs syndrome) (11/29/2022), and Sacroiliitis (HCC) (11/29/2022).   Surgical History:  D&C 1975, L5 discectomy, laser surgery 1988, polyp removal 1985, urethral dilation  Social History:      Family History:  Her family history is not on file.   Allergies: No Known Allergies   Home Medications: Prior to Admission medications   Not on File    Critical care time: 41 minutes   The patient is critically ill with multiple  organ system failure and requires high complexity decision making for assessment and support, frequent evaluation and titration of therapies, advanced monitoring, review of radiographic studies and interpretation of complex data.   Critical Care Time devoted to patient care services, exclusive of separately billable procedures, described in this note is 41 minutes.  Tim Lair, PA-C [facility] 11/30/22 2:08 AM  Please see [email or web address] for pager details.  From 7A-7P if no response, please call [phone] After hours, please call ELink [phone]

## 2022-11-29 NOTE — Code Documentation (Signed)
Responded to Code Stroke called at 2132 for aphasia, LSN-2000. Pt arrived at 2144, CBG-169, NIH-22, CT head-Large intraparenchymal hemorrhage, surrounding vasogenic edema with regional mass effect, associated intraventricular extension with blood throughout the ventricular system. TNK not given-bleed. Pt taken back to ED room where she had an episode of projectile vomiting with probable aspiration. After MD spoke to family, decision made to intubate pt. Pt given 20mg  etomidate and 100mg  rocuronium. Pt intubated at 2220 by EDP with 7.5 ETT, 21 at the lip. Pt started on propofol, fentanyl, and cleviprex gtts and OGT inserted. Plan to keep SBP 130-150, neurosurgery consult, and ICU admission.

## 2022-11-29 NOTE — Consult Note (Signed)
Reason for Consult: Intracerebral hemorrhage Referring Physician: Emergency department  Pamela Johnston is an 81 y.o. female.  HPI: 81 year old female in good general health who is independent at home presents with sudden onset aphasia and right hemiplegia.  During stroke team evaluation the patient declined further and lost consciousness with episodes of emesis.  No history of trauma.  No history of anticoagulation.  Head CT scan obtained in the emergency department demonstrates a large left hemispheric hemorrhage involving most of her left temporal lobe and left parietal lobe with extension into her internal capsule and basal ganglia.  There is been secondary dissection into her ventricular system with intraventricular hemorrhage.  No current evidence of obstructive hydrocephalus.  There is some subfalcine and transtentorial herniation.  No past medical history on file.    No family history on file.  Social History:  has no history on file for tobacco use, alcohol use, and drug use.  Allergies: No Known Allergies  Medications: Not currently available for review.  Results for orders placed or performed during the hospital encounter of 11/29/22 (from the past 48 hour(s))  Protime-INR     Status: None   Collection Time: 11/29/22  9:47 PM  Result Value Ref Range   Prothrombin Time 12.7 11.4 - 15.2 seconds   INR 1.0 0.8 - 1.2    Comment: (NOTE) INR goal varies based on device and disease states. Performed at [facility], [address], [place], [place] [address]   APTT     Status: Abnormal   Collection Time: 11/29/22  9:47 PM  Result Value Ref Range   aPTT 23 (L) 24 - 36 seconds    Comment: Performed at [facility], [address] [place], [place] [address]  CBC     Status: Abnormal   Collection Time: 11/29/22  9:47 PM  Result Value Ref Range   WBC 12.0 (H) 4.0 - 10.5 K/uL   RBC 4.35 3.87 - 5.11 MIL/uL   Hemoglobin 13.9 12.0 - 15.0 g/dL   HCT 56.3 14.9 - 70.2 %    MCV 92.9 80.0 - 100.0 fL   MCH 32.0 26.0 - 34.0 pg   MCHC 34.4 30.0 - 36.0 g/dL   RDW 63.7 85.8 - 85.0 %   Platelets 216 150 - 400 K/uL   nRBC 0.0 0.0 - 0.2 %    Comment: Performed at [facility], [address], [place], [place] [address]  Differential     Status: Abnormal   Collection Time: 11/29/22  9:47 PM  Result Value Ref Range   Neutrophils Relative % 40 %   Neutro Abs 4.8 1.7 - 7.7 K/uL   Lymphocytes Relative 49 %   Lymphs Abs 5.9 (H) 0.7 - 4.0 K/uL   Monocytes Relative 8 %   Monocytes Absolute 1.0 0.1 - 1.0 K/uL   Eosinophils Relative 2 %   Eosinophils Absolute 0.3 0.0 - 0.5 K/uL   Basophils Relative 1 %   Basophils Absolute 0.1 0.0 - 0.1 K/uL   WBC Morphology WHITE COUNT CONFIRMED ON SMEAR    RBC Morphology MORPHOLOGY UNREMARKABLE    Smear Review PLATELET COUNT CONFIRMED BY SMEAR     Comment: FEW PLATELET CLUMPS NOTED   Immature Granulocytes 0 %   Abs Immature Granulocytes 0.04 0.00 - 0.07 K/uL    Comment: Performed at [facility], [address], [place], [place] [address]  Comprehensive metabolic panel     Status: Abnormal  Collection Time: 11/29/22  9:47 PM  Result Value Ref Range   Sodium 135 135 - 145 mmol/L   Potassium 3.5 3.5 - 5.1 mmol/L   Chloride 100 98 - 111 mmol/L   CO2 21 (L) 22 - 32 mmol/L   Glucose, Bld 154 (H) 70 - 99 mg/dL    Comment: Glucose reference range applies only to samples taken after fasting for at least 8 hours.   BUN 12 8 - 23 mg/dL   Creatinine, Ser 5.46 0.44 - 1.00 mg/dL   Calcium 9.8 8.9 - 27.0 mg/dL   Total Protein 7.4 6.5 - 8.1 g/dL   Albumin 4.3 3.5 - 5.0 g/dL   AST 40 15 - 41 U/L   ALT 16 0 - 44 U/L   Alkaline Phosphatase 59 38 - 126 U/L   Total Bilirubin 0.3 0.3 - 1.2 mg/dL   GFR, Estimated >35 >00 mL/min    Comment: (NOTE) Calculated using the CKD-EPI Creatinine Equation (2021)    Anion gap 14 5 - 15    Comment: Performed at [facility], [address] [place], [place] [address]  Ethanol      Status: None   Collection Time: 11/29/22  9:47 PM  Result Value Ref Range   Alcohol, Ethyl (B) <10 <10 mg/dL    Comment: (NOTE) Lowest detectable limit for serum alcohol is 10 mg/dL.  For medical purposes only. Performed at [facility], [address] [place], [place] [address]   CBG monitoring, ED     Status: Abnormal   Collection Time: 11/29/22  9:47 PM  Result Value Ref Range   Glucose-Capillary 169 (H) 70 - 99 mg/dL    Comment: Glucose reference range applies only to samples taken after fasting for at least 8 hours.  I-stat chem 8, ED     Status: Abnormal   Collection Time: 11/29/22  9:50 PM  Result Value Ref Range   Sodium 134 (L) 135 - 145 mmol/L   Potassium 3.7 3.5 - 5.1 mmol/L   Chloride 98 98 - 111 mmol/L   BUN 13 8 - 23 mg/dL   Creatinine, Ser 1.69 0.44 - 1.00 mg/dL   Glucose, Bld 678 (H) 70 - 99 mg/dL    Comment: Glucose reference range applies only to samples taken after fasting for at least 8 hours.   Calcium, Ion 1.12 (L) 1.15 - 1.40 mmol/L   TCO2 22 22 - 32 mmol/L   Hemoglobin 14.6 12.0 - 15.0 g/dL   HCT 93.8 10.1 - 75.1 %    No results found.  Review of systems not obtained due to patient factors. Blood pressure 110/72, pulse (!) 109, resp. rate 19, weight 57.8 kg, SpO2 100 %. Patient is intubated.  She still has pharmacologic paralytics on board.  Her pupils are small and symmetric.  They are sluggish bilaterally.  No further cranial nerve function can be tested.  Motor response cannot be tested by me independently at this time however prior to intubation she had a dense right hemiplegia.  Her head shows no signs of external trauma.  Oropharynx, nasopharynx and external auditory canals are clear.  Chest and abdomen are atraumatic and benign otherwise.  Extremities are free from injury or deformity.  Assessment/Plan: Very large acute dominant hemisphere hemorrhage.  Given the size and location of this hemorrhage surgical evacuation would not improve her  overall prognosis and I could not recommend to proceed.  Recommend admission to ICU with continue efforts at medical  management although I would expect her situation to further decline.  I have discussed situation with her family.  Kathaleen Maser Mia Milan 11/29/2022, 10:52 PM

## 2022-11-29 NOTE — ED Provider Notes (Addendum)
Spoke with son-in-law Dr Les Pou who is in [place] after the patient's husband deferred to his judgment on CODE STATUS.  He can be reached at [phone].  Discussed the situation with him and he verified full code.  Is also with his daughter who can be reached at [phone].    Rondel Baton, MD 11/29/22 [id number]

## 2022-11-29 NOTE — ED Provider Notes (Addendum)
[facility] EMERGENCY DEPARTMENT Provider Note   CSN: [id number] Arrival date & time: 11/29/22  2144  An emergency department physician performed an initial assessment on this suspected stroke patient at 2144.  History  No chief complaint on file.   Pamela Johnston is a 82 y.o. female.  81 year old female with a history of hypertension and restless leg syndrome presents to the emergency department with concern for stroke.  Patient was last known well at 8 PM when she started becoming less responsive and had difficulty speaking.  Was brought into the emergency department for evaluation.  Not on blood thinners.  Fingerstick was 116.  Had an episode of nausea and vomiting in route.        Home Medications Prior to Admission medications   Not on File      Allergies    Patient has no known allergies.    Review of Systems   Review of Systems  Physical Exam Updated Vital Signs BP 114/71   Pulse 96   Temp (!) 97.5 F (36.4 C)   Resp 16   Ht _0  (1.549 m)   Wt 57.8 kg   SpO2 100%   BMI 24.08 kg/m  Physical Exam Vitals and nursing note reviewed.  Constitutional:      General: She is in acute distress.     Appearance: She is well-developed. She is ill-appearing.     Comments: Not following commands.  Left gaze preference.  Shirt covered in emesis.  HENT:     Head: Normocephalic and atraumatic.     Right Ear: External ear normal.     Left Ear: External ear normal.     Nose: Nose normal.  Eyes:     Extraocular Movements: Extraocular movements intact.     Conjunctiva/sclera: Conjunctivae normal.     Pupils: Pupils are equal, round, and reactive to light.  Cardiovascular:     Rate and Rhythm: Normal rate and regular rhythm.     Heart sounds: No murmur heard. Pulmonary:     Effort: Pulmonary effort is normal. No respiratory distress.     Breath sounds: Rhonchi (Bilaterally) present.     Comments: On 4 L nasal cannula Musculoskeletal:     Cervical  back: Normal range of motion and neck supple.  Skin:    General: Skin is warm and dry.  Neurological:     Comments: Patient opening eyes spontaneously but not tracking with left gaze preference.  Groaning occasionally but unable to speak.  Withdraws left upper extremity to pain.  Right upper extremity held in extension.  Pupils 3 mm and reactive bilaterally.  Withdraws bilateral lower extremities to pain.  Psychiatric:        Mood and Affect: Mood normal.     ED Results / Procedures / Treatments   Labs (all labs ordered are listed, but only abnormal results are displayed) Labs Reviewed  APTT - Abnormal; Notable for the following components:      Result Value   aPTT 23 (*)    All other components within normal limits  CBC - Abnormal; Notable for the following components:   WBC 12.0 (*)    All other components within normal limits  DIFFERENTIAL - Abnormal; Notable for the following components:   Lymphs Abs 5.9 (*)    All other components within normal limits  COMPREHENSIVE METABOLIC PANEL - Abnormal; Notable for the following components:   CO2 21 (*)    Glucose, Bld 154 (*)  All other components within normal limits  I-STAT CHEM 8, ED - Abnormal; Notable for the following components:   Sodium 134 (*)    Glucose, Bld 153 (*)    Calcium, Ion 1.12 (*)    All other components within normal limits  CBG MONITORING, ED - Abnormal; Notable for the following components:   Glucose-Capillary 169 (*)    All other components within normal limits  I-STAT ARTERIAL BLOOD GAS, ED - Abnormal; Notable for the following components:   pO2, Arterial 152 (*)    Sodium 134 (*)    Potassium 3.4 (*)    All other components within normal limits  PROTIME-INR  ETHANOL  TRIGLYCERIDES  SODIUM  SODIUM  BASIC METABOLIC PANEL  CBC  MAGNESIUM  PHOSPHORUS  PROTIME-INR    EKG EKG Interpretation  Date/Time:  Thursday November 29 2022 22:08:02 EST Ventricular Rate:  107 PR Interval:  134 QRS  Duration: 93 QT Interval:  361 QTC Calculation: 482 R Axis:   65 Text Interpretation: Sinus tachycardia Nonspecific T abnormalities, lateral leads Confirmed by Margaretmary Eddy [phone]) on 11/29/2022 10:33:59 PM  Radiology No results found.  Procedures Procedure Name: Intubation Date/Time: 11/29/2022 11:24 PM  Performed by: Fransico Meadow, MDPre-anesthesia Checklist: Patient identified, Emergency Drugs available, Suction available and Patient being monitored Oxygen Delivery Method: Non-rebreather mask Preoxygenation: Pre-oxygenation with 100% oxygen Induction Type: IV induction and Rapid sequence Laryngoscope Size: Mac and 3 Grade View: Grade II Tube size: 7.5 mm Number of attempts: 1 Airway Equipment and Method: Rigid stylet and Video-laryngoscopy Placement Confirmation: ETT inserted through vocal cords under direct vision, CO2 detector and Breath sounds checked- equal and bilateral Secured at: 21 cm Tube secured with: ETT holder Dental Injury: Teeth and Oropharynx as per pre-operative assessment        Medications Ordered in ED Medications  clevidipine (CLEVIPREX) infusion 0.5 mg/mL (0 mg/hr Intravenous Stopped 11/29/22 2240)  fentaNYL 2536mg in NS 2566m(1055mml) infusion-PREMIX (150 mcg/hr Intravenous Rate/Dose Change 11/29/22 2302)  fentaNYL (SUBLIMAZE) bolus via infusion 25-100 mcg (has no administration in time range)  propofol (DIPRIVAN) 1000 MG/100ML infusion (0 mcg/kg/min  57.8 kg Intravenous Rate/Dose Change 11/29/22 2302)   stroke: early stages of recovery book (has no administration in time range)  acetaminophen (TYLENOL) tablet 650 mg (has no administration in time range)    Or  acetaminophen (TYLENOL) 160 MG/5ML solution 650 mg (has no administration in time range)    Or  acetaminophen (TYLENOL) suppository 650 mg (has no administration in time range)  senna-docusate (Senokot-S) tablet 1 tablet (1 tablet Oral Not Given 11/29/22 2302)  pantoprazole  (PROTONIX) injection 40 mg (40 mg Intravenous Given 11/29/22 2315)  etomidate (AMIDATE) injection ( Intravenous Canceled Entry 11/29/22 2230)  rocuronium (ZEMURON) injection ( Intravenous Canceled Entry 11/29/22 2230)  sodium chloride (hypertonic) 3 % solution ( Intravenous New Bag/Given 11/29/22 2259)  Ampicillin-Sulbactam (UNASYN) 3 g in sodium chloride 0.9 % 100 mL IVPB (has no administration in time range)  sodium chloride flush (NS) 0.9 % injection 3 mL (3 mLs Intravenous Given 11/29/22 2238)  iohexol (OMNIPAQUE) 350 MG/ML injection 75 mL (75 mLs Intravenous Contrast Given 11/29/22 2200)  fentaNYL (SUBLIMAZE) injection 25 mcg (25 mcg Intravenous Given 11/29/22 2237)  ondansetron (ZOFRAN) injection 4 mg (4 mg Intravenous Given 11/29/22 2148)    ED Course/ Medical Decision Making/ A&P Clinical Course as of 11/29/22 2327  Thu Nov 29, 2022  2205 Dr KirLeonel Ramsay bedside. Called husband to verify code status. Is deferring to his  son in law Tyson Dense MD. He is looking for his contact information.  [RP]  2206 Spoke with Dr Royal Piedra who is in [place]. [phone] Verified full code. Daughter [phone].  [RP]  2234 Discussed with Dr Trenton Gammon from [facility] who will evaluate the patient shortly.  [RP]    Clinical Course User Index [RP] Fransico Meadow, MD                           Medical Decision Making Amount and/or Complexity of Data Reviewed Labs: ordered. Radiology: ordered.  Risk Prescription drug management. Decision regarding hospitalization.   Pamela Johnston is a 81 y.o. female with comorbidities that complicate the patient evaluation including hypertension and restless leg syndrome who presents with altered mental status and difficulty speaking  Initial Ddx:  ICH, ischemic stroke, hypoglycemia  MDM:  Feel the patient likely has ICH given her altered mental status, vomiting, and current exam.  Could potentially have LVO so code stroke was activated.  Fingerstick  within normal limits so feel this is less likely.  Plan:  CT head Labs Neurology consult  ED Summary/Re-evaluation:  Patient underwent CT head as well as CTA which showed large left-sided intraparenchymal hemorrhage with midline shift and intraventricular extension.  Due to poor mental status did confirm full CODE STATUS with the patient's son-in-law (has been deferred to her son since he is a physician).  Patient vomited again aspirated and required nonrebreather.  She was intubated for airway protection.  Neurosurgery was consulted who came to the bedside to evaluate the patient and feels that she is a poor surgical candidate so we will discuss with family and will likely be comfort measures.  Patient was started on fentanyl and propofol after intubation and did require clevidipine as well for blood pressure control.  This patient presents to the ED for concern of complaints listed in HPI, this involves an extensive number of treatment options, and is a complaint that carries with it a high risk of complications and morbidity. Disposition including potential need for admission considered.   Dispo: ICU  Additional history obtained from spouse Records reviewed Outpatient Clinic Notes The following labs were independently interpreted: Chemistry and show no acute abnormality I independently reviewed the following imaging with scope of interpretation limited to determining acute life threatening conditions related to emergency care: CT Head, which revealed  [place]   I personally reviewed and interpreted cardiac monitoring: normal sinus rhythm  I personally reviewed and interpreted the pt's EKG: see above for interpretation  I have reviewed the patients home medications and made adjustments as needed Consults: Neurology and Neurosurgery Social Determinants of health:  Elderly  Final Clinical Impression(s) / ED Diagnoses Final diagnoses:  Intraparenchymal hemorrhage of brain ([place])  Hypoxia   Aspiration into airway, initial encounter    Rx / DC Orders ED Discharge Orders     None       CRITICAL CARE Performed by: Fransico Meadow   Total critical care time: 50 minutes  Critical care time was exclusive of separately billable procedures and treating other patients.  Critical care was necessary to treat or prevent imminent or life-threatening deterioration.  Critical care was time spent personally by me on the following activities: development of treatment plan with patient and/or surrogate as well as nursing, discussions with consultants, evaluation of patient's response to treatment, examination of patient, obtaining history from patient or surrogate, ordering and performing treatments and interventions, ordering and  review of laboratory studies, ordering and review of radiographic studies, pulse oximetry and re-evaluation of patient's condition.     Fransico Meadow, MD 11/29/22 [phone]

## 2022-11-29 NOTE — Progress Notes (Signed)
Pharmacy Antibiotic Note  Pamela Johnston is a 81 y.o. female admitted on 11/29/2022 with ICH, concern for  aspiration pneumonia .  Pharmacy has been consulted for Unasyn dosing.  Plan: Unasyn 3G Q6H.  Follow culture data for de-escalation.  Monitor renal function for dose adjustments as indicated.    Height: 5\' 1"  (154.9 cm) Weight: 57.8 kg (127 lb 6.8 oz) IBW/kg (Calculated) : 47.8  Temp (24hrs), Avg:96.9 F (36.1 C), Min:96.4 F (35.8 C), Max:97.5 F (36.4 C)  Recent Labs  Lab 11/29/22 2147 11/29/22 2150  WBC 12.0*  --   CREATININE 0.96 0.80    Estimated Creatinine Clearance: 46.6 mL/min (by C-G formula based on SCr of 0.8 mg/dL).    No Known Allergies  Thank you for allowing pharmacy to be a part of this patient's care.  12/01/22, PharmD, BCCCP  11/29/2022 11:16 PM

## 2022-11-30 ENCOUNTER — Inpatient Hospital Stay (HOSPITAL_COMMUNITY): Payer: Medicare PPO

## 2022-11-30 DIAGNOSIS — I6389 Other cerebral infarction: Secondary | ICD-10-CM

## 2022-11-30 DIAGNOSIS — T17908A Unspecified foreign body in respiratory tract, part unspecified causing other injury, initial encounter: Secondary | ICD-10-CM

## 2022-11-30 DIAGNOSIS — J9601 Acute respiratory failure with hypoxia: Secondary | ICD-10-CM | POA: Diagnosis not present

## 2022-11-30 DIAGNOSIS — I611 Nontraumatic intracerebral hemorrhage in hemisphere, cortical: Secondary | ICD-10-CM | POA: Diagnosis not present

## 2022-11-30 DIAGNOSIS — I619 Nontraumatic intracerebral hemorrhage, unspecified: Principal | ICD-10-CM

## 2022-11-30 DIAGNOSIS — R0902 Hypoxemia: Secondary | ICD-10-CM

## 2022-11-30 LAB — CBC
HCT: 39.4 % (ref 36.0–46.0)
Hemoglobin: 13.2 g/dL (ref 12.0–15.0)
MCH: 32.4 pg (ref 26.0–34.0)
MCHC: 33.5 g/dL (ref 30.0–36.0)
MCV: 96.6 fL (ref 80.0–100.0)
Platelets: 159 10*3/uL (ref 150–400)
RBC: 4.08 MIL/uL (ref 3.87–5.11)
RDW: 12.5 % (ref 11.5–15.5)
WBC: 9.6 10*3/uL (ref 4.0–10.5)
nRBC: 0 % (ref 0.0–0.2)

## 2022-11-30 LAB — ECHOCARDIOGRAM COMPLETE
AR max vel: 2.85 cm2
AV Area VTI: 3.24 cm2
AV Area mean vel: 2.7 cm2
AV Mean grad: 2 mmHg
AV Peak grad: 2.9 mmHg
Ao pk vel: 0.85 m/s
Area-P 1/2: 2.46 cm2
Height: 61 in
S' Lateral: 2.8 cm
Weight: 2038.81 oz

## 2022-11-30 LAB — MAGNESIUM: Magnesium: 1.8 mg/dL (ref 1.7–2.4)

## 2022-11-30 LAB — PROTIME-INR
INR: 1.1 (ref 0.8–1.2)
Prothrombin Time: 14 seconds (ref 11.4–15.2)

## 2022-11-30 LAB — BASIC METABOLIC PANEL
Anion gap: 13 (ref 5–15)
BUN: 11 mg/dL (ref 8–23)
CO2: 18 mmol/L — ABNORMAL LOW (ref 22–32)
Calcium: 8.1 mg/dL — ABNORMAL LOW (ref 8.9–10.3)
Chloride: 116 mmol/L — ABNORMAL HIGH (ref 98–111)
Creatinine, Ser: 0.86 mg/dL (ref 0.44–1.00)
GFR, Estimated: >60 mL/min (ref >60)
Glucose, Bld: 108 mg/dL — ABNORMAL HIGH (ref 70–99)
Potassium: 3.2 mmol/L — ABNORMAL LOW (ref 3.5–5.1)
Sodium: 147 mmol/L — ABNORMAL HIGH (ref 135–145)

## 2022-11-30 LAB — PHOSPHORUS: Phosphorus: 2.6 mg/dL (ref 2.5–4.6)

## 2022-11-30 LAB — SODIUM
Sodium: 134 mmol/L — ABNORMAL LOW (ref 135–145)
Sodium: 146 mmol/L — ABNORMAL HIGH (ref 135–145)
Sodium: 155 mmol/L — ABNORMAL HIGH (ref 135–145)
Sodium: 158 mmol/L — ABNORMAL HIGH (ref 135–145)

## 2022-11-30 LAB — MRSA NEXT GEN BY PCR, NASAL: MRSA by PCR Next Gen: NOT DETECTED

## 2022-11-30 LAB — TRIGLYCERIDES: Triglycerides: 74 mg/dL (ref <150)

## 2022-11-30 MED ORDER — SENNOSIDES-DOCUSATE SODIUM 8.6-50 MG PO TABS
1.0000 | ORAL_TABLET | Freq: Two times a day (BID) | ORAL | Status: DC
  Administered 2022-11-30 – 2022-12-02 (×4): 1
  Filled 2022-11-30 (×4): qty 1

## 2022-11-30 MED ORDER — SODIUM CHLORIDE 0.9 % IV BOLUS
1000.0000 mL | Freq: Once | INTRAVENOUS | Status: AC
  Administered 2022-11-30: 1000 mL via INTRAVENOUS

## 2022-11-30 MED ORDER — SODIUM CHLORIDE 0.9 % IV SOLN: 250.0000 mL | INTRAVENOUS | Status: DC

## 2022-11-30 MED ORDER — ORAL CARE MOUTH RINSE: 15.0000 mL | OROMUCOSAL | Status: DC | PRN

## 2022-11-30 MED ORDER — ORAL CARE MOUTH RINSE
15.0000 mL | OROMUCOSAL | Status: DC
  Administered 2022-11-30 – 2023-02-27 (×1049): 15 mL via OROMUCOSAL

## 2022-11-30 MED ORDER — PHENYLEPHRINE HCL-NACL 20-0.9 MG/250ML-% IV SOLN
25.0000 ug/min | INTRAVENOUS | Status: DC
  Administered 2022-11-30: 45 ug/min via INTRAVENOUS
  Administered 2022-11-30: 25 ug/min via INTRAVENOUS
  Administered 2022-12-01: 45 ug/min via INTRAVENOUS
  Filled 2022-11-30 (×3): qty 250

## 2022-11-30 MED ORDER — CHLORHEXIDINE GLUCONATE CLOTH 2 % EX PADS
6.0000 | MEDICATED_PAD | Freq: Every day | CUTANEOUS | Status: DC
  Administered 2022-11-30 – 2023-02-26 (×95): 6 via TOPICAL

## 2022-11-30 NOTE — Progress Notes (Signed)
81yF with sudden onset aphasia, right hemiplegia found to have large left hemispheric IPH, IVH, mass effect. Intubated for airway protection.  She is passive on vent, triggering some breaths, low vent settings Mech breath sounds bl, equal chest rise  ABG post intubation 7.39/42  CXR clear lungs  A/P: # large left hemispheric IPH, IVH, brain compression # acute hypoxic respiratory failure, intubated for airway protection - full vent support for now - fentanyl and propofol for rass -1  - SBP goal per neuro - Neuroprotective measures: HOB > 30 degrees, normoglycemia, normothermia, eucapnia, correct electrolytes - husband awaiting arrival of daughter and son-in-law coming from [place] with possible transition to comfort measures once family arrives. Code status updated to DNR after discussing with husband.     Cc time 35 minutes  Lar[facility] Pulmonary/Critical Care

## 2022-11-30 NOTE — Progress Notes (Signed)
NAME:  Pamela Johnston, MRN:  [id number], DOB:  08-10-1941, LOS: 1 ADMISSION DATE:  11/29/2022 CONSULTATION DATE:  11/29/2022 REFERRING MD:  Amada Jupiter - Neuro CHIEF COMPLAINT:  IPH, vent management   History of Present Illness:  81-yer-old woman who presented to [facility] ED 12/21 as a Code Stroke. LKW 2000 when patient reportedly was in bed and became less responsive with +aphasia and R-sided weakness. One episode of nausea/vomiting en route with EMS. PMHx significant for HTN, chronic sacroiliitis (managed with Percocet), RLS, osteopenia.  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 Labs were grossly unremarkable with WBC mildly elevated to 12 and mild hyperglycemia to 154. Decision was made to intubate patient in ED after several episodes of emesis with concern for aspiration. HTS 3% was initiated.  Stroke service to admit. NSGY consulted with no plan for intervention at this time.  PCCM consulted for ventilator/hemodynamic management.   Pertinent Medical History:   Past Medical History:  Diagnosis Date   Benign essential HTN 11/29/2022   Osteopenia 11/29/2022   RLS (restless legs syndrome) 11/29/2022   Sacroiliitis (HCC) 11/29/2022   Significant Hospital Events: Including procedures, antibiotic start and stop dates in addition to other pertinent events   12/21 - Presented to [facility] ED via EMS as Code Stroke. LKW 2000. Abrupt onset of decreased responsiveness, aphasia and R hemiplegia. CT Head with large L hemispheric parietal/temporal lobe IPH with extension into basal ganglia and IVH. HTS 3% started. Stroke admitting, NSGY consulted. PCCM consult for vent management.  Interim History / Subjective:  No acute events overnight Remains intubated   Objective:  Blood pressure (!)  98/55, pulse 96, temperature 100.2 F (37.9 C), resp. rate 13, height 5\' 1"  (1.549 m), weight 57.8 kg, SpO2 98 %.    Vent Mode: PRVC FiO2 (%):  [40 %-100 %] 40 % Set Rate:  [16 bmp] 16 bmp Vt Set:  [330 mL] 330 mL PEEP:  [5 cmH20] 5 cmH20 Plateau Pressure:  [12 cmH20] 12 cmH20   Intake/Output Summary (Last 24 hours) at 11/30/2022 0740 Last data filed at 11/30/2022 0700 Gross per 24 hour  Intake 2444.61 ml  Output 1800 ml  Net 644.61 ml   Filed Weights   11/29/22 2100  Weight: 57.8 kg   Physical Examination: General: Acutely ill-appearing elderly woman in NAD. Intubated, sedated. HEENT: [place]/AT, anicteric sclera, PERRL 74mm, moist mucous membranes. ETT/OGT in place. Neuro: Sedated. Does not respond to verbal, tactile or noxious stimuli. Does not withdraw to pain. Not following commands. No spontaneous movement of extremities noted. No cough/gag. CV: RRR, no m/g/r. PULM: Breathing even and unlabored on vent. Lung fields. No wheezing. GI: Soft, nontender, nondistended. Normoactive bowel sounds. Extremities: No LE edema noted. Skin: Warm/dry, no rashes.  Resolved Hospital Problem List:    Assessment & Plan:  Large left hemispheric parietal/temporal IPH with vasogenic edema/mass effect Brain compression CT Head 12/21 with large IPH of L parietal/L temporal regions with extension into basal ganglia, secondary dissection with IVH and surrounding vasogenic edema with regional mass effect.  - Management per Stroke team (primary), Goal SBP 130-150 - HTS 3% titrated to Na 150-155 - Additional brain imaging per Neuro - Neuroprotective measures: HOB > 30 degrees, normoglycemia, normothermia, electrolytes WNL   Acute hypoxemic respiratory failure in the setting of IPH/IVH Concern for aspiration  PNA - Continue full vent support (4-8cc/kg IBW) - Wean FiO2 for O2 sat > 90% - Daily WUA/SBT when appropriate from a mental status standpoint - VAP bundle - Pulmonary hygiene - PAD protocol  for sedation: Propofol and Fentanyl for goal RASS 0 to -1 - Unasyn for ?aspiration coverage, given emesis peri-intubation  Hypotension - neosynephrine for MAP goal 65 or greater  RLS Chronic sacroiliitis Osteopenia - Hold home Mirapex, Percocet  Best Practice: (right click and "Reselect all SmartList Selections" daily)   Diet/type: NPO w/ meds via tube DVT prophylaxis: SCDs, pharmacologic ppx contraindicated in the setting of IPH GI prophylaxis: PPI Lines: N/A, may need central access for ongoing HTS Foley:  Yes, and it is still needed Code Status:  full code Last date of multidisciplinary goals of care discussion [facility] DNR per family 12/22. Awaiting family arrival to transition to comfort.]  Labs:  CBC: Recent Labs  Lab 11/29/22 2147 11/29/22 2150 11/29/22 2324 11/30/22 0606  WBC 12.0*  --   --  9.6  NEUTROABS 4.8  --   --   --   HGB 13.9 14.6 13.9 13.2  HCT 40.4 43.0 41.0 39.4  MCV 92.9  --   --  96.6  PLT 216  --   --  159   Basic Metabolic Panel: Recent Labs  Lab 11/29/22 2147 11/29/22 2150 11/29/22 2320 11/29/22 2324  NA 135 134* 134* 134*  K 3.5 3.7  --  3.4*  CL 100 98  --   --   CO2 21*  --   --   --   GLUCOSE 154* 153*  --   --   BUN 12 13  --   --   CREATININE 0.96 0.80  --   --   CALCIUM 9.8  --   --   --    GFR: Estimated Creatinine Clearance: 46.6 mL/min (by C-G formula based on SCr of 0.8 mg/dL). Recent Labs  Lab 11/29/22 2147 11/30/22 0606  WBC 12.0* 9.6   Liver Function Tests: Recent Labs  Lab 11/29/22 2147  AST 40  ALT 16  ALKPHOS 59  BILITOT 0.3  PROT 7.4  ALBUMIN 4.3   No results for input(s): "LIPASE", "AMYLASE" in the last 168 hours. No results for input(s): "AMMONIA" in the last 168 hours.  ABG:    Component Value Date/Time   PHART 7.398 11/29/2022 2324   PCO2ART 41.6 11/29/2022 2324   PO2ART 152 (H) 11/29/2022 2324   HCO3 25.8 11/29/2022 2324   TCO2 27 11/29/2022 2324   O2SAT 99 11/29/2022 2324    Coagulation  Profile: Recent Labs  Lab 11/29/22 2147 11/30/22 0606  INR 1.0 1.1   Cardiac Enzymes: No results for input(s): "CKTOTAL", "CKMB", "CKMBINDEX", "TROPONINI" in the last 168 hours.  HbA1C: No results found for: "HGBA1C"  CBG: Recent Labs  Lab 11/29/22 2147  GLUCAP 169*     Critical care time: 35 minutes   Melody Comas, MD [place] Office: [phone]   See Amion for personal pager PCCM on call pager [phone] until 7pm. Please call Elink 7p-7a. [phone]

## 2022-11-30 NOTE — Progress Notes (Signed)
eLink Physician-Brief Progress Note Patient Name: Pamela Johnston DOB: 06/06/1941 MRN: [id number]   Date of Service  11/30/2022  HPI/Events of Note  81/F with hx of HTN, brought in due to aphasia, R sided weakness, with CT showing large intraparenchymal hemorrhage with extension into basal ganglia, secondary dissection with IVH and surrounding vasogenic edema with regional mass effect.   BP 102/60, HR 107, RR 18, O2 sats 98% Pt is intubated and sedated.    eICU Interventions  Keep SBP 130-150 on cleviprex.  Pt is sedated on fentanyl and propofol. Hypertonic saline to keep Na >150.  Empiric antibiotics.  SCDs for DVT prophylaxis.        Intervention Category Evaluation Type: New Patient Evaluation  Larinda Buttery 11/30/2022, 5:46 AM

## 2022-11-30 NOTE — Progress Notes (Signed)
Initial Nutrition Assessment  DOCUMENTATION CODES:   Not applicable  INTERVENTION: Plan is to hold off on initiation of enteral nutrition at this time.  If plan is to start tube feeds via OGT recommend: -Initiate VAF 1.2 at 15 mL/hour and advance by 10 mL/hour every 8 hours to goal rate of 55 mL/hour (1320 mL goal daily volume) -Provides: 1584 kcal, 99 grams of protein, 1069 mL H2O daily  NUTRITION DIAGNOSIS:   Inadequate oral intake related to inability to eat as evidenced by NPO status.  GOAL:   Provide needs based on ASPEN/SCCM guidelines  MONITOR:   Vent status, Labs, Weight trends, I & O's  REASON FOR ASSESSMENT:   Ventilator    ASSESSMENT:   81 year old female with PMHx of HTN, osteopenia, RLS, sacroiliitis admitted with large left hemispheric parietal/temporal IPH with vasogenic edema/mass effect, also with acute respiratory failure and concern for aspiration PNA given emesis per-intubation.  Per review of chart Neurosurgery was consulted, but due to size of IPH, prognosis is poor and neurosurgical intervention would not improve prognosis.  12/22: family initially made pt DNR, later decided to change to full code at this time as they discuss GOC  No family members present at time of RD assessment. Pt documented to be 57.8 kg on admission. Reviewed notes in Care Everywhere for >1 year and weights were not measured at office visits available in chart, so no weight history available to trend.  Patient is currently intubated on ventilator support MV: 5.2 L/min Temp (24hrs), Avg:98.9 F (37.2 C), Min:96.4 F (35.8 C), Max:100.6 F (38.1 C)  Medications reviewed and include: pantoprazole, senna-docusate, Unasyn, fentanyl gtt, phenylephrine gtt at 65 mcg/min, 3% NaCl at 75 mL/hr  Labs reviewed: Sodium 146, Potassium 3.2, Chloride 116, CO2 18  Enteral Access: 16 Fr. OGT placed 12/21; terminates in body of stomach per abdominal x-ray 12/21  UOP: 1800 mL UOP  yesterday (adm <24 hrs)  I/O: +2333.7 mL since admission  Discussed with RN. Discussed with MD via secure chat. Plan is to hold off on initiation of tube feeds at this time pending further GOC discussions.  NUTRITION - FOCUSED PHYSICAL EXAM:  Flowsheet Row Most Recent Value  Orbital Region Mild depletion  Upper Arm Region No depletion  Thoracic and Lumbar Region No depletion  Buccal Region Unable to assess  Temple Region Moderate depletion  Clavicle Bone Region Mild depletion  Clavicle and Acromion Bone Region Mild depletion  Scapular Bone Region Unable to assess  Dorsal Hand No depletion  Patellar Region Moderate depletion  Anterior Thigh Region Moderate depletion  Posterior Calf Region Severe depletion  Edema (RD Assessment) None  Hair Reviewed  Eyes Unable to assess  Mouth Unable to assess  Skin Reviewed  Nails Reviewed       Diet Order:   Diet Order             Diet NPO time specified  Diet effective now                  EDUCATION NEEDS:   Not appropriate for education at this time  Skin:  Skin Assessment: Reviewed RN Assessment  Last BM:  PTA  Height:   Ht Readings from Last 1 Encounters:  11/29/22 5\' 1"  (1.549 m)   Weight:   Wt Readings from Last 1 Encounters:  11/29/22 57.8 kg   Ideal Body Weight:  47.7 kg  BMI:  Body mass index is 24.08 kg/m.  Estimated Nutritional Needs:  Kcal:  1500-1700  Protein:  80-100 grams  Fluid:  1.5-1.7 L/day  Letta Median, MS, RD, LDN, CNSC Pager number available on Amion

## 2022-11-30 NOTE — IPAL (Signed)
  Interdisciplinary Goals of Care Family Meeting   Date carried out: 11/30/2022  Location of the meeting: Bedside  Member's involved: Physician and Family Member or next of kin  Durable Power of Attorney or acting medical decision maker: Nyra Jabs    Discussion: We discussed goals of care for Pamela Johnston .  Discussion held with neurology stroke team and Critical Care team. After reviewing head imaging and discussion of poor prognosis, the family wishes for her to be full code at this time until they can talk further amongst the family.   Code status:   Code Status: Full Code   Disposition: Continue current acute care  Time spent for the meeting: 20 minutes    Martina Sinner, MD  11/30/2022, 2:50 PM

## 2022-11-30 NOTE — Progress Notes (Signed)
SLP Cancellation Note  Patient Details Name: Pamela Johnston MRN: [id number] DOB: Nov 12, 1941   Cancelled treatment:       Reason Eval/Treat Not Completed: Patient not medically ready (on vent). Will f/u as able.     Mahala Menghini., M.A. CCC-SLP Acute Rehabilitation Services Office [phone]  Secure chat preferred  11/30/2022, 7:41 AM

## 2022-11-30 NOTE — Progress Notes (Signed)
  Transition of Care [facility]) Screening Note   Patient Details  Name: Pamela Johnston Date of Birth: December 29, 1940   Transition of Care [facility]) CM/SW Contact:    Mearl Latin, LCSW Phone Number: 11/30/2022, 9:58 AM    Transition of Care Department [facility]) has reviewed patient. We will continue to monitor patient advancement through interdisciplinary progression rounds. If new patient transition needs arise, please place a TOC consult.

## 2022-11-30 NOTE — Progress Notes (Addendum)
STROKE TEAM PROGRESS NOTE   INTERVAL HISTORY Patient is seen in her room with no family at the bedside.  Yesterday, she presented with acute onset right-sided weakness and aphasia.  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  ICH score was 3.  Neurosurgery was consulted, but due to size of IPH, prognosis is poor and neurosurgical intervention would not improve prognosis. Neurological exam remains quite poor with sluggishly reactive pupils and doll's eye reflexes.  Trace withdrawal in both upper and lower extremities left more than right..  Blood pressure controlled on Cleviprex drip Vitals:   11/30/22 1250 11/30/22 1300 11/30/22 1330 11/30/22 1400  BP: (!) 87/46 (!) 95/49 (!) 107/57 114/60  Pulse:  75  76  Resp:  16  16  Temp:  99.9 F (37.7 C) 99.7 F (37.6 C) 99.7 F (37.6 C)  TempSrc:      SpO2:  96%  96%  Weight:      Height:       CBC:  Recent Labs  Lab 11/29/22 2147 11/29/22 2150 11/29/22 2324 11/30/22 0606  WBC 12.0*  --   --  9.6  NEUTROABS 4.8  --   --   --   HGB 13.9   < > 13.9 13.2  HCT 40.4   < > 41.0 39.4  MCV 92.9  --   --  96.6  PLT 216  --   --  159   < > = values in this interval not displayed.   Basic Metabolic Panel:  Recent Labs  Lab 11/29/22 2147 11/29/22 2150 11/29/22 2320 11/29/22 2324 11/30/22 0924 11/30/22 1039  NA 135 134*   < > 134* 147* 146*  K 3.5 3.7  --  3.4* 3.2*  --   CL 100 98  --   --  116*  --   CO2 21*  --   --   --  18*  --   GLUCOSE 154* 153*  --   --  108*  --   BUN 12 13  --   --  11  --   CREATININE 0.96 0.80  --   --  0.86  --   CALCIUM 9.8  --   --   --  8.1*  --   MG  --   --   --   --  1.8  --   PHOS  --   --   --   --  2.6  --    < > = values in this interval not displayed.   Lipid Panel:  Recent Labs  Lab 11/30/22 0606  TRIG 74   HgbA1c: No results for input(s): "HGBA1C" in the last 168 hours. Urine Drug Screen: No results for input(s): "LABOPIA", "COCAINSCRNUR", "LABBENZ", "AMPHETMU",  "THCU", "LABBARB" in the last 168 hours.  Alcohol Level  Recent Labs  Lab 11/29/22 2147  ETH <10    IMAGING past 24 hours ECHOCARDIOGRAM COMPLETE  Result Date: 11/30/2022    ECHOCARDIOGRAM REPORT   Patient Name:   Pamela Johnston Date of Exam: 11/30/2022 Medical Rec #:  [id number]     Height:       61.0 in Accession #:    [id number]    Weight:       127.4 lb Date of Birth:  02/01/1941      BSA:          1.559 m Patient Age:    81 years      BP:  107/57 mmHg Patient Gender: F             HR:           84 bpm. Exam Location:  Inpatient Procedure: 2D Echo, Cardiac Doppler and Color Doppler Indications:    Stroke  History:        Patient has no prior history of Echocardiogram examinations.                 Risk Factors:Hypertension.  Sonographer:    Ross Ludwig RDCS (AE) Referring Phys: [phone] MCNEILL P KIRKPATRICK  Sonographer Comments: Echo performed with patient supine and on artificial respirator. IMPRESSIONS  1. Left ventricular ejection fraction, by estimation, is 60 to 65%. The left ventricle has normal function. The left ventricle has no regional wall motion abnormalities. Left ventricular diastolic parameters are consistent with Grade I diastolic dysfunction (impaired relaxation).  2. Right ventricular systolic function is normal. The right ventricular size is normal. There is normal pulmonary artery systolic pressure.  3. No evidence of mitral valve regurgitation.  4. The aortic valve was not well visualized. Aortic valve regurgitation is not visualized.  5. The inferior vena cava is dilated in size with >50% respiratory variability, suggesting right atrial pressure of 8 mmHg. Comparison(s): No prior Echocardiogram. FINDINGS  Left Ventricle: Left ventricular ejection fraction, by estimation, is 60 to 65%. The left ventricle has normal function. The left ventricle has no regional wall motion abnormalities. The left ventricular internal cavity size was normal in size. There is  no left ventricular  hypertrophy. Left ventricular diastolic parameters are consistent with Grade I diastolic dysfunction (impaired relaxation). Right Ventricle: The right ventricular size is normal. Right ventricular systolic function is normal. There is normal pulmonary artery systolic pressure. The tricuspid regurgitant velocity is 2.39 m/s, and with an assumed right atrial pressure of 8 mmHg,  the estimated right ventricular systolic pressure is 30.8 mmHg. Left Atrium: Left atrial size was normal in size. Right Atrium: Right atrial size was normal in size. Pericardium: There is no evidence of pericardial effusion. Mitral Valve: No evidence of mitral valve regurgitation. Tricuspid Valve: Tricuspid valve regurgitation is mild. Aortic Valve: The aortic valve was not well visualized. Aortic valve regurgitation is not visualized. Aortic valve mean gradient measures 2.0 mmHg. Aortic valve peak gradient measures 2.9 mmHg. Aortic valve area, by VTI measures 3.24 cm. Pulmonic Valve: The pulmonic valve was not well visualized. Aorta: The aortic root is normal in size and structure. Venous: The inferior vena cava is dilated in size with greater than 50% respiratory variability, suggesting right atrial pressure of 8 mmHg. IAS/Shunts: No atrial level shunt detected by color flow Doppler.  LEFT VENTRICLE PLAX 2D LVIDd:         4.10 cm   Diastology LVIDs:         2.80 cm   LV e' medial:    5.33 cm/s LV PW:         0.90 cm   LV E/e' medial:  13.2 LV IVS:        1.00 cm   LV e' lateral:   7.83 cm/s LVOT diam:     2.00 cm   LV E/e' lateral: 9.0 LV SV:         52 LV SV Index:   33 LVOT Area:     3.14 cm  RIGHT VENTRICLE            IVC RV Basal diam:  2.30 cm    IVC diam:  2.10 cm RV S prime:     8.92 cm/s TAPSE (M-mode): 1.7 cm LEFT ATRIUM           Index        RIGHT ATRIUM          Index LA diam:      1.60 cm 1.03 cm/m   RA Area:     7.16 cm LA Vol (A2C): 14.2 ml 9.11 ml/m   RA Volume:   12.80 ml 8.21 ml/m LA Vol (A4C): 20.1 ml 12.89 ml/m   AORTIC VALVE AV Area (Vmax):    2.85 cm AV Area (Vmean):   2.70 cm AV Area (VTI):     3.24 cm AV Vmax:           84.50 cm/s AV Vmean:          56.700 cm/s AV VTI:            0.159 m AV Peak Grad:      2.9 mmHg AV Mean Grad:      2.0 mmHg LVOT Vmax:         76.70 cm/s LVOT Vmean:        48.700 cm/s LVOT VTI:          0.164 m LVOT/AV VTI ratio: 1.03  AORTA Ao Root diam: 3.20 cm MITRAL VALVE               TRICUSPID VALVE MV Area (PHT): 2.46 cm    TR Peak grad:   22.8 mmHg MV Decel Time: 308 msec    TR Vmax:        239.00 cm/s MV E velocity: 70.60 cm/s MV A velocity: 76.00 cm/s  SHUNTS MV E/A ratio:  0.93        Systemic VTI:  0.16 m                            Systemic Diam: 2.00 cm Carolan Clines Electronically signed by Carolan Clines Signature Date/Time: 11/30/2022/2:08:17 PM    Final    DG Chest Port 1 View  Result Date: 11/30/2022 CLINICAL DATA:  Aspiration pneumonia EXAM: PORTABLE CHEST 1 VIEW COMPARISON:  Yesterday FINDINGS: Endotracheal tube with tip at the clavicular heads. The enteric tube reaches the stomach. Hazy density at the bases, greater on the left. Normal heart size. No visible pneumothorax or significant effusion IMPRESSION: Stable hardware positioning and lower chest opacity. Electronically Signed   By: Tiburcio Pea M.D.   On: 11/30/2022 05:51   DG Abd Portable 1 View  Result Date: 11/29/2022 CLINICAL DATA:  Verify orogastric tube placement EXAM: PORTABLE ABDOMEN - 1 VIEW COMPARISON:  None Available. FINDINGS: Orogastric tube tip is in the body of the stomach. No dilated bowel loops are seen. IMPRESSION: Orogastric tube tip is in the body of the stomach. Electronically Signed   By: Darliss Cheney M.D.   On: 11/29/2022 23:25   DG Chest Portable 1 View  Result Date: 11/29/2022 CLINICAL DATA:  verify intubation EXAM: PORTABLE CHEST 1 VIEW COMPARISON:  None Available. FINDINGS: Endotracheal tube tip 2 cm above the carina. Enteric tube courses below the hemidiaphragm with tip and side port  collimated off view. The heart and mediastinal contours are within normal limits. No focal consolidation. No pulmonary edema. No pleural effusion. No pneumothorax. No acute osseous abnormality. IMPRESSION: 1. No active disease. 2. Endotracheal tube in good position. 3. Enteric tube courses below the hemidiaphragm with tip and  side port collimated off view. Electronically Signed   By: Tish Frederickson M.D.   On: 11/29/2022 23:22   CT HEAD CODE STROKE WO CONTRAST  Result Date: 11/29/2022 CLINICAL DATA:  Initial evaluation for acute aphasia, left gaze preference. EXAM: CT ANGIOGRAPHY HEAD AND NECK TECHNIQUE: Multidetector CT imaging of the head and neck was performed using the standard protocol during bolus administration of intravenous contrast. Multiplanar CT image reconstructions and MIPs were obtained to evaluate the vascular anatomy. Carotid stenosis measurements (when applicable) are obtained utilizing NASCET criteria, using the distal internal carotid diameter as the denominator. RADIATION DOSE REDUCTION: This exam was performed according to the departmental dose-optimization program which includes automated exposure control, adjustment of the mA and/or kV according to patient size and/or use of iterative reconstruction technique. CONTRAST:  27mL OMNIPAQUE IOHEXOL 350 MG/ML SOLN COMPARISON:  None Available. FINDINGS: CT HEAD FINDINGS Brain: Large intraparenchymal hemorrhage centered at the posterior left cerebral hemisphere measures approximately 7.0 x 7.6 x 6.6 cm in greatest dimensions (estimated volume 180 mL). Surrounding vasogenic edema with regional mass effect. Associated intraventricular extension with blood throughout the ventricular system. Up to 1.5 cm of left-to-right shift. Mild asymmetric dilatation of the right lateral ventricle without overt hydrocephalus or trapping. No other acute intracranial hemorrhage. No other acute large vessel territory infarct. No visible mass lesion or extra-axial  fluid collection. Vascular: No abnormal hyperdense vessel. Skull: Scalp soft tissues demonstrate no acute finding. Calvarium intact. Sinuses/Orbits: Left gaze noted. Paranasal sinuses are largely clear. Mastoid air cells largely clear as well. Other: None. ASPECTS [facility] Stroke Program Early CT Score) Does not apply, acute intracranial hemorrhage. CTA NECK FINDINGS Aortic arch: Examination degraded by motion artifact. Visualized aortic arch normal caliber with standard 3 vessel morphology. Mild atheromatous change about the arch itself. No stenosis about the origin the great vessels. Right carotid system: Right common and internal carotid arteries patent. No visible dissection or hemodynamically significant stenosis. Left carotid system: Left common and internal carotid arteries are patent. No visible dissection or hemodynamically significant stenosis. Vertebral arteries: Both vertebral arteries arise from the subclavian arteries. No proximal subclavian artery stenosis. Vertebral arteries patent without stenosis or dissection. Skeleton: No discrete or worrisome osseous lesions. Moderate spondylosis present at C5-6. Other neck: No other acute soft tissue abnormality within the neck. Upper chest: Visualized upper chest demonstrates no acute finding. Review of the MIP images confirms the above findings CTA HEAD FINDINGS Anterior circulation: Evaluation of the intracranial circulation limited by motion artifact. Both internal carotid arteries patent to the termini without stenosis or other abnormality. A1 segments patent. Grossly normal anterior communicating artery complex. Anterior cerebral arteries grossly patent to their distal aspects without visible stenosis. No visible M1 stenosis or occlusion. Normal MCA bifurcations. No proximal MCA branch occlusion or high-grade stenosis. Distal MCA branches grossly perfused and symmetric. Posterior circulation: Both vertebral arteries patent without stenosis. Right  vertebral artery slightly dominant. Left PICA patent. Right PICA not seen. Basilar patent without stenosis. Superior cerebellar arteries patent bilaterally. Both PCAs primarily supplied via the basilar well perfused or distal aspects. Venous sinuses: Not well assessed due to motion and timing the contrast bolus. Anatomic variants: No vascular abnormality seen underlying the left cerebral hemorrhage. No aneurysm. Review of the MIP images confirms the above findings IMPRESSION: 1. Large intraparenchymal hemorrhage centered at the posterior left cerebral hemisphere measuring approximately 7.0 x 7.6 x 6.6 cm in greatest dimensions (estimated volume 180 mL). Surrounding vasogenic edema with regional mass effect. Up to 1.5 cm  of left-to-right shift. 2. Associated intraventricular extension with blood throughout the ventricular system. Mild asymmetric dilatation of the right lateral ventricle without overt hydrocephalus or trapping. 3. Negative CTA of the head and neck. No large vessel occlusion or other emergent finding. No vascular abnormality seen underlying the left cerebral hemorrhage. These results were communicated to Dr. Amada Jupiter at 10:16 pm on 11/29/2022 by text page via the [facility] messaging system. Electronically Signed   By: Rise Mu M.D.   On: 11/29/2022 22:33   CT ANGIO HEAD NECK W WO CM  Result Date: 11/29/2022 CLINICAL DATA:  Initial evaluation for acute aphasia, left gaze preference. EXAM: CT ANGIOGRAPHY HEAD AND NECK TECHNIQUE: Multidetector CT imaging of the head and neck was performed using the standard protocol during bolus administration of intravenous contrast. Multiplanar CT image reconstructions and MIPs were obtained to evaluate the vascular anatomy. Carotid stenosis measurements (when applicable) are obtained utilizing NASCET criteria, using the distal internal carotid diameter as the denominator. RADIATION DOSE REDUCTION: This exam was performed according to the departmental  dose-optimization program which includes automated exposure control, adjustment of the mA and/or kV according to patient size and/or use of iterative reconstruction technique. CONTRAST:  75mL OMNIPAQUE IOHEXOL 350 MG/ML SOLN COMPARISON:  None Available. FINDINGS: CT HEAD FINDINGS Brain: Large intraparenchymal hemorrhage centered at the posterior left cerebral hemisphere measures approximately 7.0 x 7.6 x 6.6 cm in greatest dimensions (estimated volume 180 mL). Surrounding vasogenic edema with regional mass effect. Associated intraventricular extension with blood throughout the ventricular system. Up to 1.5 cm of left-to-right shift. Mild asymmetric dilatation of the right lateral ventricle without overt hydrocephalus or trapping. No other acute intracranial hemorrhage. No other acute large vessel territory infarct. No visible mass lesion or extra-axial fluid collection. Vascular: No abnormal hyperdense vessel. Skull: Scalp soft tissues demonstrate no acute finding. Calvarium intact. Sinuses/Orbits: Left gaze noted. Paranasal sinuses are largely clear. Mastoid air cells largely clear as well. Other: None. ASPECTS [facility] Stroke Program Early CT Score) Does not apply, acute intracranial hemorrhage. CTA NECK FINDINGS Aortic arch: Examination degraded by motion artifact. Visualized aortic arch normal caliber with standard 3 vessel morphology. Mild atheromatous change about the arch itself. No stenosis about the origin the great vessels. Right carotid system: Right common and internal carotid arteries patent. No visible dissection or hemodynamically significant stenosis. Left carotid system: Left common and internal carotid arteries are patent. No visible dissection or hemodynamically significant stenosis. Vertebral arteries: Both vertebral arteries arise from the subclavian arteries. No proximal subclavian artery stenosis. Vertebral arteries patent without stenosis or dissection. Skeleton: No discrete or worrisome  osseous lesions. Moderate spondylosis present at C5-6. Other neck: No other acute soft tissue abnormality within the neck. Upper chest: Visualized upper chest demonstrates no acute finding. Review of the MIP images confirms the above findings CTA HEAD FINDINGS Anterior circulation: Evaluation of the intracranial circulation limited by motion artifact. Both internal carotid arteries patent to the termini without stenosis or other abnormality. A1 segments patent. Grossly normal anterior communicating artery complex. Anterior cerebral arteries grossly patent to their distal aspects without visible stenosis. No visible M1 stenosis or occlusion. Normal MCA bifurcations. No proximal MCA branch occlusion or high-grade stenosis. Distal MCA branches grossly perfused and symmetric. Posterior circulation: Both vertebral arteries patent without stenosis. Right vertebral artery slightly dominant. Left PICA patent. Right PICA not seen. Basilar patent without stenosis. Superior cerebellar arteries patent bilaterally. Both PCAs primarily supplied via the basilar well perfused or distal aspects. Venous sinuses: Not well assessed due  to motion and timing the contrast bolus. Anatomic variants: No vascular abnormality seen underlying the left cerebral hemorrhage. No aneurysm. Review of the MIP images confirms the above findings IMPRESSION: 1. Large intraparenchymal hemorrhage centered at the posterior left cerebral hemisphere measuring approximately 7.0 x 7.6 x 6.6 cm in greatest dimensions (estimated volume 180 mL). Surrounding vasogenic edema with regional mass effect. Up to 1.5 cm of left-to-right shift. 2. Associated intraventricular extension with blood throughout the ventricular system. Mild asymmetric dilatation of the right lateral ventricle without overt hydrocephalus or trapping. 3. Negative CTA of the head and neck. No large vessel occlusion or other emergent finding. No vascular abnormality seen underlying the left cerebral  hemorrhage. These results were communicated to Dr. Amada JupiterKirkpatrick at 10:16 pm on 11/29/2022 by text page via the [facility] messaging system. Electronically Signed   By: Rise MuBenjamin  McClintock M.D.   On: 11/29/2022 22:33    PHYSICAL EXAM General: Intubated elderly patient in no acute distress Respiratory: Respirations synchronous with ventilator Neurological: Pupils equal round and sluggishly reactive to light, oculocephalic reflex present.  Will withdraw left lower extremity to noxious stimulation, trace movement of right lower extremity and bilateral upper extremities to noxious stimuli  ASSESSMENT/PLAN Pamela Johnston is a 81 y.o. female with history of hypertension and restless leg syndrome presenting with acute onset aphasia and right-sided weakness.  CT head on admission revealed very large left parietal ICH.  Neurosurgery was consulted, but it was not felt that neurosurgical intervention would be helpful to patient.  Prognosis is poor, and patient may transition to comfort care today.  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 HgbA1c No results found for requested labs within last 1095 days. VTE prophylaxis -SCDs    Diet   Diet NPO time specified   No antithrombotic prior to admission, now on No antithrombotic secondary to ICH Therapy recommendations: Pending Disposition: Pending  Hypertension Home meds: None Stable but on low end of normal Systolic  blood pressure goal 1 30-1 50 Long-term BP goal normotensive  Hyperlipidemia Home meds: None LDL No results found for requested labs within last 1095 days., goal < 70 High intensity statin not indicated secondary to ICH Continue statin at discharge  Respiratory failure Patient was intubated for airway protection Ventilator management per CCM  Other Stroke Risk Factors Advanced Age >/= 6465   Other Active Problems None  Hospital day # 1  Cortney E Ernestina Columbiae La Torre , MSN, AGACNP-BC [facility] See Amion for schedule and pager information 11/30/2022 2:44 PM   STROKE MD NOTE :  I have personally obtained history,examined this patient, reviewed notes, independently viewed imaging studies, participated in medical decision making and plan of care.ROS completed by me personally and pertinent positives fully documented  I have made any additions or clarifications directly to the above note. Agree with note above.  Patient unfortunately presented with a catastrophic left intracerebral hemorrhage with volume of 180 cubic cc with significant cytotoxic edema, transferred And  downward brain herniation with hydrocephalus the very poor neurological exam which is not compatible with survival we will.  She is likely going to herniate further and become brain-dead or if life support is continued she will end up in a vegetative state.  I had extensive discussion with patient's husband at the bedside as well as was woke to her son in [place] over the phone and daughter and son-in-law at the bedside when they arrived after rounds about her poor prognosis and recommended DNR and withdrawal of ventilatory support and compassionate extubation.  Family needs more time to process this and make a decision request for continue present care.  I explained to them that she may die over the next 24 hours due to progressive cerebral edema brainstem compression and they understand.  Patient is not a candidate of any  neurosurgical intervention at the present time and he understands this.  Discussed with Dr. Francine Graven critical care medicine. This patient is critically ill and at significant risk of neurological worsening, death and care requires constant monitoring of vital signs, hemodynamics,respiratory and cardiac monitoring, extensive review of multiple databases, frequent neurological assessment, discussion with family, other specialists and medical decision making of high complexity.I have made any additions or clarifications directly to the above note.This critical care time does not reflect procedure time, or teaching time or supervisory time of PA/NP/Med Resident etc but could involve care discussion time.  I spent 60 minutes of neurocritical care time  in the care of  this patient.     Delia Heady, MD Medical Director [facility] Pager: [phone] 11/30/2022 5:28 PM  To contact Stroke Continuity provider, please refer to [email or web address]. After hours, contact General Neurology

## 2022-11-30 NOTE — Progress Notes (Signed)
PT Cancellation Note  Patient Details Name: Pamela Johnston MRN: [id number] DOB: 05-Oct-1941   Cancelled Treatment:    Reason Eval/Treat Not Completed: Patient not medically ready.  Pt is vented and sedated.  Will try back 12/23. 11/30/2022  Jacinto Halim., PT Acute Rehabilitation Services [phone]  (office)   [name] 11/30/2022, 11:46 AM

## 2022-11-30 NOTE — ED Notes (Signed)
Pt's husband has arrived and is at bedside. Communicated this to Dr. Amada Jupiter

## 2022-11-30 NOTE — Progress Notes (Signed)
OT Cancellation Note  Patient Details Name: Pamela Johnston MRN: [id number] DOB: [birth year]   Cancelled Treatment:    Reason Eval/Treat Not Completed: Other (comment) Order received, but not to start until after 22:15 tonight. We will attempt eval tomorrow as appropriate.  Ignacia Palma, OTR/L [facility] [phone] Office [phone]    Evette Georges 11/30/2022, 5:50 AM

## 2022-11-30 NOTE — Progress Notes (Signed)
  Echocardiogram 2D Echocardiogram has been performed.  Gerda Diss 11/30/2022, 1:45 PM

## 2022-12-01 ENCOUNTER — Inpatient Hospital Stay (HOSPITAL_COMMUNITY): Payer: Medicare PPO

## 2022-12-01 DIAGNOSIS — T17908A Unspecified foreign body in respiratory tract, part unspecified causing other injury, initial encounter: Secondary | ICD-10-CM | POA: Diagnosis not present

## 2022-12-01 DIAGNOSIS — R0902 Hypoxemia: Secondary | ICD-10-CM | POA: Diagnosis not present

## 2022-12-01 DIAGNOSIS — G936 Cerebral edema: Secondary | ICD-10-CM | POA: Diagnosis not present

## 2022-12-01 DIAGNOSIS — I611 Nontraumatic intracerebral hemorrhage in hemisphere, cortical: Secondary | ICD-10-CM | POA: Diagnosis not present

## 2022-12-01 LAB — BASIC METABOLIC PANEL
BUN: 15 mg/dL (ref 8–23)
CO2: 13 mmol/L — ABNORMAL LOW (ref 22–32)
Calcium: 8.4 mg/dL — ABNORMAL LOW (ref 8.9–10.3)
Chloride: >130 mmol/L (ref 98–111)
Creatinine, Ser: 1.16 mg/dL — ABNORMAL HIGH (ref 0.44–1.00)
GFR, Estimated: 48 mL/min — ABNORMAL LOW (ref >60)
Glucose, Bld: 129 mg/dL — ABNORMAL HIGH (ref 70–99)
Potassium: 4.1 mmol/L (ref 3.5–5.1)
Sodium: 166 mmol/L (ref 135–145)

## 2022-12-01 LAB — LIPID PANEL
Cholesterol: 178 mg/dL (ref 0–200)
HDL: 58 mg/dL (ref >40)
LDL Cholesterol: 107 mg/dL — ABNORMAL HIGH (ref 0–99)
Total CHOL/HDL Ratio: 3.1 RATIO
Triglycerides: 64 mg/dL (ref <150)
VLDL: 13 mg/dL (ref 0–40)

## 2022-12-01 LAB — CBC
HCT: 41.9 % (ref 36.0–46.0)
Hemoglobin: 12.9 g/dL (ref 12.0–15.0)
MCH: 32.4 pg (ref 26.0–34.0)
MCHC: 30.8 g/dL (ref 30.0–36.0)
MCV: 105.3 fL — ABNORMAL HIGH (ref 80.0–100.0)
Platelets: 102 10*3/uL — ABNORMAL LOW (ref 150–400)
RBC: 3.98 MIL/uL (ref 3.87–5.11)
RDW: 13.5 % (ref 11.5–15.5)
WBC: 15.4 10*3/uL — ABNORMAL HIGH (ref 4.0–10.5)
nRBC: 0 % (ref 0.0–0.2)

## 2022-12-01 LAB — SODIUM
Sodium: 166 mmol/L (ref 135–145)
Sodium: 159 mmol/L — ABNORMAL HIGH (ref 135–145)

## 2022-12-01 LAB — GLUCOSE, CAPILLARY
Glucose-Capillary: 138 mg/dL — ABNORMAL HIGH (ref 70–99)
Glucose-Capillary: 131 mg/dL — ABNORMAL HIGH (ref 70–99)

## 2022-12-01 LAB — PHOSPHORUS: Phosphorus: 1.4 mg/dL — ABNORMAL LOW (ref 2.5–4.6)

## 2022-12-01 LAB — MAGNESIUM: Magnesium: 1.8 mg/dL (ref 1.7–2.4)

## 2022-12-01 MED ORDER — PROSOURCE TF20 ENFIT COMPATIBL EN LIQD
60.0000 mL | Freq: Every day | ENTERAL | Status: DC
  Administered 2022-12-01 – 2022-12-12 (×11): 60 mL
  Filled 2022-12-01 (×11): qty 60

## 2022-12-01 MED ORDER — VITAL HIGH PROTEIN PO LIQD
1000.0000 mL | ORAL | Status: AC
  Administered 2022-12-01: 1000 mL

## 2022-12-01 MED ORDER — FREE WATER
200.0000 mL | Status: DC
  Administered 2022-12-01 – 2022-12-02 (×7): 200 mL

## 2022-12-01 MED ORDER — SODIUM CHLORIDE 0.45 % IV SOLN: INTRAVENOUS | Status: DC

## 2022-12-01 NOTE — Progress Notes (Signed)
PT Cancellation Note  Patient Details Name: Pamela Johnston MRN: [id number] DOB: 1941/01/06   Cancelled Treatment:    Reason Eval/Treat Not Completed: Patient not medically ready. Pt with a left ICH with volume of 180 cubic cc and downward brain herniation. Coordinated with RN for PT to sign off at this time and will await re-consult if medically ready.   Raymond Gurney, PT, DPT Acute Rehabilitation Services  Office: [phone]    Jewel Baize 12/01/2022, 1:41 PM

## 2022-12-01 NOTE — Progress Notes (Addendum)
STROKE TEAM PROGRESS NOTE   INTERVAL HISTORY Patient is seen in her room with her husband at the bed side.  Large left-sided IPH appears stable on imaging.  Hypertonic saline was discontinued due to sodium of 166, will give free water flushes and infusion of half-normal saline and closely monitor.  Her vital signs remained stable, and her neurological exam is unchanged.  Discussions with family regarding goals of care are ongoing. Vitals:   12/01/22 0800 12/01/22 0900 12/01/22 1000 12/01/22 1100  BP: (!) 147/84 (!) 154/92 (!) 155/98 (!) 141/82  Pulse: 86 88 (!) 104 89  Resp: (!) 24 20 (!) 23 14  Temp: 98.6 F (37 C) 98.6 F (37 C) 98.6 F (37 C) 98.6 F (37 C)  TempSrc: Bladder     SpO2: 97% 97% 98% 98%  Weight:      Height:       CBC:  Recent Labs  Lab 11/29/22 2147 11/29/22 2150 11/30/22 0606 12/01/22 0645  WBC 12.0*  --  9.6 15.4*  NEUTROABS 4.8  --   --   --   HGB 13.9   < > 13.2 12.9  HCT 40.4   < > 39.4 41.9  MCV 92.9  --  96.6 105.3*  PLT 216  --  159 102*   < > = values in this interval not displayed.    Basic Metabolic Panel:  Recent Labs  Lab 11/30/22 0924 11/30/22 1039 12/01/22 0645 12/01/22 1034  NA 147*   < > 166* 166*  K 3.2*  --  4.1  --   CL 116*  --  >130*  --   CO2 18*  --  13*  --   GLUCOSE 108*  --  129*  --   BUN 11  --  15  --   CREATININE 0.86  --  1.16*  --   CALCIUM 8.1*  --  8.4*  --   MG 1.8  --   --   --   PHOS 2.6  --   --   --    < > = values in this interval not displayed.    Lipid Panel:  Recent Labs  Lab 12/01/22 0645  CHOL 178  TRIG 64  HDL 58  CHOLHDL 3.1  VLDL 13  LDLCALC 161107*    HgbA1c: No results for input(s): "HGBA1C" in the last 168 hours. Urine Drug Screen: No results for input(s): "LABOPIA", "COCAINSCRNUR", "LABBENZ", "AMPHETMU", "THCU", "LABBARB" in the last 168 hours.  Alcohol Level  Recent Labs  Lab 11/29/22 2147  ETH <10     IMAGING past 24 hours CT HEAD WO CONTRAST (5MM)  Result Date:  12/01/2022 CLINICAL DATA:  Hemorrhagic stroke EXAM: CT HEAD WITHOUT CONTRAST TECHNIQUE: Contiguous axial images were obtained from the base of the skull through the vertex without intravenous contrast. RADIATION DOSE REDUCTION: This exam was performed according to the departmental dose-optimization program which includes automated exposure control, adjustment of the mA and/or kV according to patient size and/or use of iterative reconstruction technique. COMPARISON:  Two days prior FINDINGS: Brain: Widespread hemorrhage in the left cerebral hemisphere, quantification of clot limited by dissecting pattern with intervening edematous parenchyma. Intraventricular extension is present to the level of the fourth ventricle. Midline shift measures up to 12 mm. Increasing ventricular volume although not over dilated. Subarachnoid blood scattered along the cerebellar and cerebral convexities, likely redistribution of the intraventricular clot. Vascular: No hyperdense vessel or unexpected calcification. Skull: Negative Sinuses/Orbits: No acute finding IMPRESSION:  No progression of the extensive left cerebral hemorrhage with intraventricular extension. Intraventricular blood clot has diffused into the subarachnoid spaces. Hematoma and swelling causes 12 mm of midline shift. Electronically Signed   By: Tiburcio Pea M.D.   On: 12/01/2022 11:19   ECHOCARDIOGRAM COMPLETE  Result Date: 11/30/2022    ECHOCARDIOGRAM REPORT   Patient Name:   Pamela Johnston Date of Exam: 11/30/2022 Medical Rec #:  [id number]     Height:       61.0 in Accession #:    [id number]    Weight:       127.4 lb Date of Birth:  [birth year]      BSA:          1.559 m Patient Age:    81 years      BP:           107/57 mmHg Patient Gender: F             HR:           84 bpm. Exam Location:  Inpatient Procedure: 2D Echo, Cardiac Doppler and Color Doppler Indications:    Stroke  History:        Patient has no prior history of Echocardiogram examinations.                  Risk Factors:Hypertension.  Sonographer:    Ross Ludwig RDCS (AE) Referring Phys: [phone] MCNEILL P KIRKPATRICK  Sonographer Comments: Echo performed with patient supine and on artificial respirator. IMPRESSIONS  1. Left ventricular ejection fraction, by estimation, is 60 to 65%. The left ventricle has normal function. The left ventricle has no regional wall motion abnormalities. Left ventricular diastolic parameters are consistent with Grade I diastolic dysfunction (impaired relaxation).  2. Right ventricular systolic function is normal. The right ventricular size is normal. There is normal pulmonary artery systolic pressure.  3. No evidence of mitral valve regurgitation.  4. The aortic valve was not well visualized. Aortic valve regurgitation is not visualized.  5. The inferior vena cava is dilated in size with >50% respiratory variability, suggesting right atrial pressure of 8 mmHg. Comparison(s): No prior Echocardiogram. FINDINGS  Left Ventricle: Left ventricular ejection fraction, by estimation, is 60 to 65%. The left ventricle has normal function. The left ventricle has no regional wall motion abnormalities. The left ventricular internal cavity size was normal in size. There is  no left ventricular hypertrophy. Left ventricular diastolic parameters are consistent with Grade I diastolic dysfunction (impaired relaxation). Right Ventricle: The right ventricular size is normal. Right ventricular systolic function is normal. There is normal pulmonary artery systolic pressure. The tricuspid regurgitant velocity is 2.39 m/s, and with an assumed right atrial pressure of 8 mmHg,  the estimated right ventricular systolic pressure is 30.8 mmHg. Left Atrium: Left atrial size was normal in size. Right Atrium: Right atrial size was normal in size. Pericardium: There is no evidence of pericardial effusion. Mitral Valve: No evidence of mitral valve regurgitation. Tricuspid Valve: Tricuspid valve regurgitation is mild.  Aortic Valve: The aortic valve was not well visualized. Aortic valve regurgitation is not visualized. Aortic valve mean gradient measures 2.0 mmHg. Aortic valve peak gradient measures 2.9 mmHg. Aortic valve area, by VTI measures 3.24 cm. Pulmonic Valve: The pulmonic valve was not well visualized. Aorta: The aortic root is normal in size and structure. Venous: The inferior vena cava is dilated in size with greater than 50% respiratory variability, suggesting right atrial pressure of 8 mmHg. IAS/Shunts: No atrial  level shunt detected by color flow Doppler.  LEFT VENTRICLE PLAX 2D LVIDd:         4.10 cm   Diastology LVIDs:         2.80 cm   LV e' medial:    5.33 cm/s LV PW:         0.90 cm   LV E/e' medial:  13.2 LV IVS:        1.00 cm   LV e' lateral:   7.83 cm/s LVOT diam:     2.00 cm   LV E/e' lateral: 9.0 LV SV:         52 LV SV Index:   33 LVOT Area:     3.14 cm  RIGHT VENTRICLE            IVC RV Basal diam:  2.30 cm    IVC diam: 2.10 cm RV S prime:     8.92 cm/s TAPSE (M-mode): 1.7 cm LEFT ATRIUM           Index        RIGHT ATRIUM          Index LA diam:      1.60 cm 1.03 cm/m   RA Area:     7.16 cm LA Vol (A2C): 14.2 ml 9.11 ml/m   RA Volume:   12.80 ml 8.21 ml/m LA Vol (A4C): 20.1 ml 12.89 ml/m  AORTIC VALVE AV Area (Vmax):    2.85 cm AV Area (Vmean):   2.70 cm AV Area (VTI):     3.24 cm AV Vmax:           84.50 cm/s AV Vmean:          56.700 cm/s AV VTI:            0.159 m AV Peak Grad:      2.9 mmHg AV Mean Grad:      2.0 mmHg LVOT Vmax:         76.70 cm/s LVOT Vmean:        48.700 cm/s LVOT VTI:          0.164 m LVOT/AV VTI ratio: 1.03  AORTA Ao Root diam: 3.20 cm MITRAL VALVE               TRICUSPID VALVE MV Area (PHT): 2.46 cm    TR Peak grad:   22.8 mmHg MV Decel Time: 308 msec    TR Vmax:        239.00 cm/s MV E velocity: 70.60 cm/s MV A velocity: 76.00 cm/s  SHUNTS MV E/A ratio:  0.93        Systemic VTI:  0.16 m                            Systemic Diam: 2.00 cm Carolan Clines Electronically  signed by Carolan Clines Signature Date/Time: 11/30/2022/2:08:17 PM    Final     PHYSICAL EXAM General: Intubated elderly patient in no acute distress Respiratory: Respirations synchronous with ventilator Neurological: Pupils equal round and sluggishly reactive to light, gaze deviation, will not blink to threat on the right, cough and gag present.  Will withdraw left lower extremity to noxious stimulation, trace movement of right lower extremity and bilateral upper extremities to noxious stimuli  ASSESSMENT/PLAN Pamela Johnston is a 81 y.o. female with history of hypertension and restless leg syndrome presenting with acute onset aphasia and right-sided weakness.  CT  head on admission revealed very large left parietal ICH.  Neurosurgery was consulted, but it was not felt that neurosurgical intervention would be helpful to patient.  Prognosis is poor, and goals of care discussions with family are ongoing.  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  Had long discussion with daughter, son-in-law and other families, family need more time regarding GOC decisions.  Will involve palliative care  Cerebral edema and brain compression Severe cytotoxic edema, trans falcine and downward brain herniation seen on CT Developing hydrocephalus seen on CT, neurosurgery declines to intervene as this will not change  her prognosis Patient was on hypertonic saline, but this is discontinued due to sodium of 166 Will continue to monitor sodiums every 6 hours Will cautiously use free water flushes and half-normal saline infusion to return sodium to goal of 150-155  Hypertension Home meds: None Stable but on low end of normal Systolic blood pressure goal 130-150 Long-term BP goal normotensive  Hyperlipidemia Home meds: None LDL 107., goal < 70 High intensity statin not indicated secondary to ICH Continue statin at discharge  Respiratory failure Patient was intubated for airway protection Ventilator management per CCM  Other Stroke Risk Factors Advanced Age >/= 8   Other Active Problems None  Hospital day # 2  Pamela Johnston , MSN, AGACNP-BC [facility] See Amion for schedule and pager information 12/01/2022 12:08 PM   ATTENDING NOTE: I reviewed above note and agree with the assessment and plan. Pt was seen and examined.   81 year old female with history of hypertension, RLS admitted for aphasia and right-sided weakness.  CT showed large left parietal ICH and IVH with midline shift 1.5 cm.  Patient was intubated for poor mental status.  CTA head and neck no AVM or aneurysm.  EF 60 to 65%.  Repeat CT showed stable hematoma with midline shift 1.2 cm.  Sodium 134-147-155-166.  Creatinine 0.86.  Tmax 100.6  On exam, patient intubated not on sedation, eyes closed but halfway open on voice, not following commands. With forced eye opening, eyes in left gaze position, not cross midline, not blinking to visual threat on the right but blinking on the left, not tracking to the right, PERRL. Corneal reflex present, gag and cough present. Breathing over the vent.  Facial symmetry not able to test due to ET tube.  Tongue protrusion not cooperative. On pain stimulation, no movement on the RUE, slight withdraw at RLE, spontaneous movement of LUE and LLE. DTR 1+ and bilateral positive  babinski. Sensation, coordination and gait not tested.  Etiology for patient large ICH and IVH not quite clear, may related to CAA versus hypertension.  However patient prognosis is quite poor, discussed with family in length, yet to make decision for GOC.  Will involve palliative care services.  Currently continue full code, discontinue 3% saline given sodium 166, on free water flush and half-normal saline.  BP under control, off neo-,  continue on fentanyl.  Vent management per CCM.  Tmax 100.6, on Unasyn.  Discussed with CCM Dr. Francine Graven.  For detailed assessment and plan, please refer to above/below as I have made changes wherever appropriate.   Marvel Plan, MD PhD Stroke Neurology 12/01/2022 7:19 PM  This patient is critically ill due to large ICH and IVH, cerebral edema, hyponatremia and at significant risk of neurological worsening, death form brain herniation, brain death, hydrocephalus, seizure. This patient's care requires constant monitoring of vital signs, hemodynamics, respiratory and cardiac monitoring, review of multiple databases, neurological assessment, discussion with family, other specialists and medical decision making of high complexity. I spent 55 minutes of neurocritical care time in the care of this patient. I had long discussion with son-in-law, daughter, sister and other family members at bedside, updated pt current condition, treatment plan and potential prognosis, and answered all the questions.  They expressed understanding and appreciation.      To contact Stroke Continuity provider, please refer to [email or web address]. After hours, contact General Neurology

## 2022-12-01 NOTE — Progress Notes (Signed)
OT Cancellation Note  Patient Details Name: Pamela Johnston MRN: [id number] DOB: [birth year]   Cancelled Treatment:    Reason Eval/Treat Not Completed: Patient not medically ready Bedrest orders for large L ICH. OT to hold at this time  [name] 12/01/2022, 6:50 AM

## 2022-12-01 NOTE — Progress Notes (Signed)
NAME:  Pamela Johnston, MRN:  [id number], DOB:  1941-02-24, LOS: 2 ADMISSION DATE:  11/29/2022 CONSULTATION DATE:  11/29/2022 REFERRING MD:  Leonel Ramsay - Neuro CHIEF COMPLAINT:  IPH, vent management   History of Present Illness:  72-yer-old woman who presented to [facility] ED 12/21 as a Code Stroke. LKW 2000 when patient reportedly was in bed and became less responsive with +aphasia and R-sided weakness. One episode of nausea/vomiting en route with EMS. PMHx significant for HTN, chronic sacroiliitis (managed with Percocet), RLS, osteopenia.  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 Labs were grossly unremarkable with WBC mildly elevated to 12 and mild hyperglycemia to 154. Decision was made to intubate patient in ED after several episodes of emesis with concern for aspiration. HTS 3% was initiated.  Stroke service to admit. NSGY consulted with no plan for intervention at this time.  PCCM consulted for ventilator/hemodynamic management.   Pertinent Medical History:   Past Medical History:  Diagnosis Date   Benign essential HTN 11/29/2022   Osteopenia 11/29/2022   RLS (restless legs syndrome) 11/29/2022   Sacroiliitis ([place]) 11/29/2022   Significant Hospital Events: Including procedures, antibiotic start and stop dates in addition to other pertinent events   12/21 - Presented to [facility] ED via EMS as Code Stroke. LKW 2000. Abrupt onset of decreased responsiveness, aphasia and R hemiplegia. CT Head with large L hemispheric parietal/temporal lobe IPH with extension into basal ganglia and IVH. HTS 3% started. Stroke admitting, NSGY consulted. PCCM consult for vent management.  Interim History / Subjective:  No acute events overnight Remains intubated   Objective:  Blood pressure (!)  153/86, pulse 79, temperature 98.6 F (37 C), resp. rate (!) 31, height 5\' 1"  (1.549 m), weight 57.8 kg, SpO2 100 %.    Vent Mode: PRVC FiO2 (%):  [30 %] 30 % Set Rate:  [16 bmp] 16 bmp Vt Set:  [330 mL] 330 mL PEEP:  [5 cmH20] 5 cmH20 Plateau Pressure:  [12 cmH20-14 cmH20] 13 cmH20   Intake/Output Summary (Last 24 hours) at 12/01/2022 0740 Last data filed at 12/01/2022 0630 Gross per 24 hour  Intake 3909.11 ml  Output 1685 ml  Net 2224.11 ml   Filed Weights   11/29/22 2100  Weight: 57.8 kg   Physical Examination: General: elderly woman, Intubated, sedated. HEENT: [place]/AT, anicteric sclera, PERRL 87mm, moist mucous membranes. ETT/OGT in place. Neuro: Sedated. Does not respond to verbal, tactile or noxious stimuli. Does not withdraw to pain. Not following commands. No spontaneous movement of extremities noted. No cough/gag. CV: RRR, no m/g/r. PULM: clear to auscultation. No wheezing. GI: Soft, nontender, nondistended. Normoactive bowel sounds. Extremities: No LE edema noted. Skin: Warm/dry, no rashes.  Resolved Hospital Problem List:    Assessment & Plan:  Large left hemispheric parietal/temporal IPH with vasogenic edema/mass effect Brain compression CT Head 12/21 with large IPH of L parietal/L temporal regions with extension into basal ganglia, secondary dissection with IVH and surrounding vasogenic edema with regional mass effect.  - Management per Stroke team (primary), Goal SBP 130-150 - HTS 3% titrated to Na 150-155 - Additional brain imaging per Neuro - Neuroprotective measures: HOB > 30 degrees, normoglycemia, normothermia, electrolytes WNL   Acute hypoxemic respiratory failure in the setting of IPH/IVH Concern for aspiration PNA - Continue full vent support (4-8cc/kg IBW) -  Wean FiO2 for O2 sat > 90% - Daily WUA/SBT when appropriate from a mental status standpoint - VAP bundle - Pulmonary hygiene - PAD protocol for sedation: Propofol and Fentanyl for goal RASS 0  to -1 - Unasyn for ?aspiration coverage, given emesis peri-intubation  Hypotension - neosynephrine for MAP goal 65 or greater  RLS Chronic sacroiliitis Osteopenia - Hold home Mirapex, Percocet  Best Practice: (right click and "Reselect all SmartList Selections" daily)   Diet/type: tubefeeds DVT prophylaxis: SCDs, pharmacologic ppx contraindicated in the setting of IPH GI prophylaxis: PPI Lines: N/A, may need central access for ongoing HTS Foley:  Yes, and it is still needed Code Status:  full code Last date of multidisciplinary goals of care discussion [12/22 Full code per daughter]  Labs:  CBC: Recent Labs  Lab 11/29/22 2147 11/29/22 2150 11/29/22 2324 11/30/22 0606  WBC 12.0*  --   --  9.6  NEUTROABS 4.8  --   --   --   HGB 13.9 14.6 13.9 13.2  HCT 40.4 43.0 41.0 39.4  MCV 92.9  --   --  96.6  PLT 216  --   --  159   Basic Metabolic Panel: Recent Labs  Lab 11/29/22 2147 11/29/22 2150 11/29/22 2320 11/29/22 2324 11/30/22 0924 11/30/22 1039 11/30/22 1651 11/30/22 2230  NA 135 134*   < > 134* 147* 146* 155* 158*  K 3.5 3.7  --  3.4* 3.2*  --   --   --   CL 100 98  --   --  116*  --   --   --   CO2 21*  --   --   --  18*  --   --   --   GLUCOSE 154* 153*  --   --  108*  --   --   --   BUN 12 13  --   --  11  --   --   --   CREATININE 0.96 0.80  --   --  0.86  --   --   --   CALCIUM 9.8  --   --   --  8.1*  --   --   --   MG  --   --   --   --  1.8  --   --   --   PHOS  --   --   --   --  2.6  --   --   --    < > = values in this interval not displayed.   GFR: Estimated Creatinine Clearance: 43.4 mL/min (by C-G formula based on SCr of 0.86 mg/dL). Recent Labs  Lab 11/29/22 2147 11/30/22 0606  WBC 12.0* 9.6   Liver Function Tests: Recent Labs  Lab 11/29/22 2147  AST 40  ALT 16  ALKPHOS 59  BILITOT 0.3  PROT 7.4  ALBUMIN 4.3   No results for input(s): "LIPASE", "AMYLASE" in the last 168 hours. No results for input(s): "AMMONIA" in the last 168  hours.  ABG:    Component Value Date/Time   PHART 7.398 11/29/2022 2324   PCO2ART 41.6 11/29/2022 2324   PO2ART 152 (H) 11/29/2022 2324   HCO3 25.8 11/29/2022 2324   TCO2 27 11/29/2022 2324   O2SAT 99 11/29/2022 2324    Coagulation Profile: Recent Labs  Lab 11/29/22 2147 11/30/22 0606  INR 1.0 1.1   Cardiac Enzymes: No results for input(s): "CKTOTAL", "CKMB", "CKMBINDEX", "TROPONINI" in the last 168 hours.  HbA1C: No results found for: "HGBA1C"  CBG: Recent Labs  Lab 11/29/22 2147  GLUCAP 169*     Critical care time: 35 minutes   Freda Jackson, MD [facility] Office: [phone]   See Amion for personal pager PCCM on call pager [phone] until 7pm. Please call Elink 7p-7a. [phone]

## 2022-12-02 DIAGNOSIS — I619 Nontraumatic intracerebral hemorrhage, unspecified: Secondary | ICD-10-CM

## 2022-12-02 DIAGNOSIS — R0902 Hypoxemia: Secondary | ICD-10-CM | POA: Diagnosis not present

## 2022-12-02 DIAGNOSIS — G936 Cerebral edema: Secondary | ICD-10-CM | POA: Diagnosis not present

## 2022-12-02 DIAGNOSIS — I611 Nontraumatic intracerebral hemorrhage in hemisphere, cortical: Secondary | ICD-10-CM | POA: Diagnosis not present

## 2022-12-02 LAB — BASIC METABOLIC PANEL
Anion gap: 10 (ref 5–15)
BUN: 21 mg/dL (ref 8–23)
CO2: 20 mmol/L — ABNORMAL LOW (ref 22–32)
Calcium: 8.6 mg/dL — ABNORMAL LOW (ref 8.9–10.3)
Chloride: 124 mmol/L — ABNORMAL HIGH (ref 98–111)
Creatinine, Ser: 1.06 mg/dL — ABNORMAL HIGH (ref 0.44–1.00)
GFR, Estimated: 53 mL/min — ABNORMAL LOW (ref >60)
Glucose, Bld: 145 mg/dL — ABNORMAL HIGH (ref 70–99)
Potassium: 2.6 mmol/L — CL (ref 3.5–5.1)
Sodium: 154 mmol/L — ABNORMAL HIGH (ref 135–145)

## 2022-12-02 LAB — GLUCOSE, CAPILLARY
Glucose-Capillary: 134 mg/dL — ABNORMAL HIGH (ref 70–99)
Glucose-Capillary: 126 mg/dL — ABNORMAL HIGH (ref 70–99)
Glucose-Capillary: 108 mg/dL — ABNORMAL HIGH (ref 70–99)
Glucose-Capillary: 141 mg/dL — ABNORMAL HIGH (ref 70–99)
Glucose-Capillary: 151 mg/dL — ABNORMAL HIGH (ref 70–99)
Glucose-Capillary: 140 mg/dL — ABNORMAL HIGH (ref 70–99)

## 2022-12-02 LAB — CBC
HCT: 33.5 % — ABNORMAL LOW (ref 36.0–46.0)
Hemoglobin: 11.1 g/dL — ABNORMAL LOW (ref 12.0–15.0)
MCH: 33 pg (ref 26.0–34.0)
MCHC: 33.1 g/dL (ref 30.0–36.0)
MCV: 99.7 fL (ref 80.0–100.0)
Platelets: 102 10*3/uL — ABNORMAL LOW (ref 150–400)
RBC: 3.36 MIL/uL — ABNORMAL LOW (ref 3.87–5.11)
RDW: 14 % (ref 11.5–15.5)
WBC: 13.5 10*3/uL — ABNORMAL HIGH (ref 4.0–10.5)
nRBC: 0 % (ref 0.0–0.2)

## 2022-12-02 LAB — SODIUM
Sodium: 158 mmol/L — ABNORMAL HIGH (ref 135–145)
Sodium: 157 mmol/L — ABNORMAL HIGH (ref 135–145)
Sodium: 155 mmol/L — ABNORMAL HIGH (ref 135–145)
Sodium: 155 mmol/L — ABNORMAL HIGH (ref 135–145)

## 2022-12-02 LAB — PHOSPHORUS
Phosphorus: 1.4 mg/dL — ABNORMAL LOW (ref 2.5–4.6)
Phosphorus: 2.7 mg/dL (ref 2.5–4.6)

## 2022-12-02 LAB — MAGNESIUM
Magnesium: 1.9 mg/dL (ref 1.7–2.4)
Magnesium: 1.8 mg/dL (ref 1.7–2.4)

## 2022-12-02 MED ORDER — SODIUM CHLORIDE 0.9 % IV SOLN: INTRAVENOUS | Status: DC

## 2022-12-02 MED ORDER — VITAL AF 1.2 CAL PO LIQD: 1000.0000 mL | ORAL | Status: DC

## 2022-12-02 MED ORDER — POTASSIUM CHLORIDE 20 MEQ PO PACK
40.0000 meq | PACK | ORAL | Status: AC
  Administered 2022-12-02 (×2): 40 meq
  Filled 2022-12-02 (×2): qty 2

## 2022-12-02 MED ORDER — VITAL AF 1.2 CAL PO LIQD
1000.0000 mL | ORAL | Status: AC
  Administered 2022-12-02 – 2022-12-27 (×15): 1000 mL
  Filled 2022-12-02: qty 1000

## 2022-12-02 MED ORDER — POTASSIUM PHOSPHATES 15 MMOLE/5ML IV SOLN
30.0000 mmol | Freq: Once | INTRAVENOUS | Status: AC
  Administered 2022-12-02: 30 mmol via INTRAVENOUS
  Filled 2022-12-02: qty 10

## 2022-12-02 NOTE — Progress Notes (Signed)
Occupational Therapy Discharge Patient Details Name: Pamela Johnston MRN: [id number] DOB: 12/28/1940 Today's Date: 12/02/2022 Time:  -     Patient discharged from OT services secondary to medical decline - will need to re-order OT to resume therapy services.  Please see latest therapy progress note for current level of functioning and progress toward goals.    Progress and discharge plan discussed with patient and/or caregiver:  Care providers aware and OT to dc at this time pending prognosis.   GO     [name] 12/02/2022, 6:17 AM

## 2022-12-02 NOTE — Progress Notes (Signed)
STROKE TEAM PROGRESS NOTE   INTERVAL HISTORY Patient is seen in her room with her son at the bedside.  Her vital signs have been stable, and her neurological exam is stable.  BM is at 154 this morning, discontinued half-normal half-normal saline and started normal saline infusion to maintain sodium at goal.  Palliative care consulted for goals of care conversation with family. Vitals:   12/02/22 0900 12/02/22 1000 12/02/22 1100 12/02/22 1200  BP: 118/62 125/62 120/62 (!) 111/57  Pulse: 67 95 71 71  Resp: 17 (!) 27 16 18   Temp: 99.3 F (37.4 C) 99 F (37.2 C) 98.8 F (37.1 C) 99.3 F (37.4 C)  TempSrc:      SpO2: 99% 97% 98% 99%  Weight:      Height:       CBC:  Recent Labs  Lab 11/29/22 2147 11/29/22 2150 12/01/22 0645 12/02/22 0444  WBC 12.0*   < > 15.4* 13.5*  NEUTROABS 4.8  --   --   --   HGB 13.9   < > 12.9 11.1*  HCT 40.4   < > 41.9 33.5*  MCV 92.9   < > 105.3* 99.7  PLT 216   < > 102* 102*   < > = values in this interval not displayed.    Basic Metabolic Panel:  Recent Labs  Lab 12/01/22 0645 12/01/22 1034 12/01/22 1627 12/01/22 2326 12/02/22 0444  NA 166*   < > 159* 158* 154*  K 4.1  --   --   --  2.6*  CL >130*  --   --   --  124*  CO2 13*  --   --   --  20*  GLUCOSE 129*  --   --   --  145*  BUN 15  --   --   --  21  CREATININE 1.16*  --   --   --  1.06*  CALCIUM 8.4*  --   --   --  8.6*  MG  --   --  1.8  --  1.9  PHOS  --   --  1.4*  --  1.4*   < > = values in this interval not displayed.    Lipid Panel:  Recent Labs  Lab 12/01/22 0645  CHOL 178  TRIG 64  HDL 58  CHOLHDL 3.1  VLDL 13  LDLCALC 12/03/22*    HgbA1c: No results for input(s): "HGBA1C" in the last 168 hours. Urine Drug Screen: No results for input(s): "LABOPIA", "COCAINSCRNUR", "LABBENZ", "AMPHETMU", "THCU", "LABBARB" in the last 168 hours.  Alcohol Level  Recent Labs  Lab 11/29/22 2147  ETH <10     IMAGING past 24 hours No results found.  PHYSICAL EXAM General:  Intubated elderly patient in no acute distress Respiratory: Respirations synchronous with ventilator Neurological: Pupils equal round and sluggishly reactive to light, left gaze deviation, will not blink to threat on the right, cough and gag present.  Will withdraw left lower extremity to noxious stimulation, trace movement of right lower extremity and bilateral upper extremities to noxious stimuli.  Will occasionally spontaneously move left upper extremity nonpurposefully.  ASSESSMENT/PLAN Pamela Johnston is a 81 y.o. female with history of hypertension and restless leg syndrome presenting with acute onset aphasia and right-sided weakness.  CT head on admission revealed very large left parietal ICH.  Neurosurgery was consulted, but it was not felt that neurosurgical intervention would be helpful to patient.  Prognosis is poor, and goals  of care discussions with family are ongoing.  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  Had long discussion with daughter, son-in-law and other families, family need more time regarding GOC decisions.  Will involve palliative care  Cerebral edema Severe cytotoxic edema, trans falcine and downward brain herniation seen on CT Developing hydrocephalus seen on CT, neurosurgery declines to intervene as this will not change her prognosis Patient was  on hypertonic saline, but this was discontinued due to sodium of 166, now Sodium returned to goal at 154, will maintain sodium at goal using normal saline infusion. Will continue to monitor sodiums every 6 hours   Hypertension Home meds: None Stable but on low end of normal Systolic blood pressure goal 130-150 Long-term BP goal normotensive  Hyperlipidemia Home meds: None LDL 107., goal < 70 High intensity statin not indicated secondary to ICH Continue statin at discharge  Respiratory failure Patient was intubated for airway protection Ventilator management per CCM  Other Stroke Risk Factors Advanced Age >/= 22   Other Active Problems None  Hospital day # 3   E Ernestina Columbia , MSN, AGACNP-BC [facility] See Amion for schedule and pager information 12/02/2022 12:27 PM   ATTENDING NOTE: I reviewed above note and agree with the assessment and plan. Pt was seen and examined.   Husband at bedside. Pt still intubated and neuro unchanged from yesterday, still has left gaze, not following commands, right hemiplegia. Na 154 now and put on NS and d/c FW. Pending family decision on GOC, palliative care consulted. CT repeat in am.   For detailed assessment and plan, please refer to above/below as I have made changes wherever appropriate.   Marvel Plan, MD PhD Stroke Neurology 12/03/2022 12:33 AM  This patient is critically ill due to large ICH and IVH, cerebral edema, hyponatremia and at significant risk of neurological worsening, death form brain herniation, brain death, hydrocephalus, seizure. This patient's care requires constant monitoring of vital signs, hemodynamics, respiratory and cardiac monitoring, review of multiple databases, neurological assessment, discussion with family, other specialists and medical decision making of high complexity. I spent 35 minutes of neurocritical care time in the care of this patient.    To contact Stroke Continuity provider,  please refer to [email or web address]. After hours, contact General Neurology

## 2022-12-02 NOTE — Progress Notes (Addendum)
Nutrition Follow-up RD working remotely.   DOCUMENTATION CODES:   Not applicable  INTERVENTION:  - ordered Vital AF 1.2 @ 15 ml/hr to advance by 10 ml every 12 hours to reach goal rate of 55 ml/hr.  - at goal rate, this regimen will provide 1584 kcal, 99 grams protein, and 1070 ml water.  - continue order for Prosource TF20 until Vital AF 1.2 reaches goal rate.   NUTRITION DIAGNOSIS:   Inadequate oral intake related to inability to eat as evidenced by NPO status. -ongoing  GOAL:   Provide needs based on ASPEN/SCCM guidelines -to be met with TF regimen  MONITOR:   Vent status, TF tolerance, Labs, Weight trends  REASON FOR ASSESSMENT:   Consult Enteral/tube feeding initiation and management  ASSESSMENT:   81 year old female with PMHx of HTN, osteopenia, RLS, sacroiliitis admitted with large left hemispheric parietal/temporal IPH with vasogenic edema/mass effect, also with acute respiratory failure and concern for aspiration PNA given emesis per-intubation.  Patient assessed in person by a RD on 12/22. Patient remains intubated with OGT in place; no abdominal x-ray since 12/21.   Order placed 12/23 evening for Vital High Protein @ 40 ml/hr with 60 ml Prosource TF20 once/day. This regimen is providing 1040 kcal, 104 grams protein, and 802 ml water. No current order for water flush; adjustments being made to IV fluid to aid with Na goal.   Weight on 12/21 was 127 lb and weight today Is 128.5 lb. Non-pitting edema to BUE documented in the edema section of flow sheet.   Neurology following. Palliative Care consulted 12/23 for ongoing Zeb discussions.    Patient is currently intubated on ventilator support MV: 6.7 L/min Temp (24hrs), Avg:98.9 F (37.2 C), Min:98.1 F (36.7 C), Max:100 F (37.8 C) Propofol: now off, Cleveprex: now off  Labs reviewed; CBGs: 134, 126, 108 mg/dl, Na: 154 mmol/l, K: 2.6 mmol/l, Cl: 124 mmol/l, creatinine: 1.06 mg/dl, Ca: 8.6 mg/dl, Phos:1.4  mg/dl, Mg: WDL, GFR: 53 ml/min.  Medications reviewed; 40 mg IV protonix/day, 40 mEq Klor-Con x2 doses 12/24, 30 mmol IV KPhos x1 run 12/24, 1 tablet senokot BID.  IVF; NS @ 75 ml/hr.   Diet Order:   Diet Order             Diet NPO time specified  Diet effective now                   EDUCATION NEEDS:   Not appropriate for education at this time  Skin:  Skin Assessment: Reviewed RN Assessment  Last BM:  PTA/unknown  Height:   Ht Readings from Last 1 Encounters:  11/29/22 _0  (1.549 m)    Weight:   Wt Readings from Last 1 Encounters:  12/02/22 58.3 kg     BMI:  Body mass index is 24.29 kg/m.  Estimated Nutritional Needs:  Kcal:  1500-1700 Protein:  80-100 grams Fluid:  1.5-1.7 L/day     Jarome Matin, MS, RD, LDN, CNSC Clinical Dietitian PRN/Relief staff On-call/weekend pager # available in [facility]

## 2022-12-02 NOTE — Progress Notes (Signed)
   12/02/22 1031  Provider Notification  Provider Name/Title Pola Corn RN and MD  Date Provider Notified 12/02/22  Time Provider Notified [phone]  Method of Notification Call  Notification Reason Critical Result  Test performed and critical result Potassium 2.6  Date Critical Result Received 12/02/22  Time Critical Result Received 0623  Provider response Other (Comment);See new orders (Awaiting new orders)  Date of Provider Response 12/02/22  Time of Provider Response [phone]

## 2022-12-02 NOTE — Progress Notes (Signed)
     Referral received for Pamela Johnston :goals of care discussion. Chart reviewed and updates received from RN. Patient assessed and is unable to engage appropriately in discussions. Currently intubated.    I was able to speak with patient's husband at the bedside and daughter/son-in-law via phone. Family would like to meet and further discuss goals of care in person. Daughter and son-in-law will call once they arrive to the hospital later today. They are aware if today does not work out with timing we will can plan to meet tomorrow prior to 1230pm. Contact information provided and they are aware RN may secure chat if needed. Family is aware we will meet at patient's bedside.   Detailed note and recommendations to follow once GOC has been completed.   Thank you for your referral and allowing PMT to assist in Mrs. Pamela Johnston's care.   Willette Alma, AGPCNP-BC Palliative Medicine Team  Phone: [phone] Pager: [phone] Amion: N. Cousar   NO CHARGE

## 2022-12-02 NOTE — Progress Notes (Signed)
Patient's daughter and son in law called asking if patient could be given nutrition. I informed them that patient has an OG tube and has been getting tube feed since 1700 12/23 and they verbalized understanding. All questions answered.  Sherral Hammers RN

## 2022-12-02 NOTE — Progress Notes (Signed)
eLink Physician-Brief Progress Note Patient Name: Pamela Johnston DOB: 1941/10/07 MRN: [id number]   Date of Service  12/02/2022  HPI/Events of Note  Hypokalemia - K+ = 2.6 and Creatinine = 1.06.  eICU Interventions  Will replace K+.     Intervention Category Major Interventions: Electrolyte abnormality - evaluation and management  Lenell Antu 12/02/2022, 6:35 AM

## 2022-12-02 NOTE — Progress Notes (Signed)
NAME:  Pamela Johnston, MRN:  [id number], DOB:  [birth year], LOS: 3 ADMISSION DATE:  11/29/2022 CONSULTATION DATE:  11/29/2022 REFERRING MD:  Amada Jupiter - Neuro CHIEF COMPLAINT:  IPH, vent management   History of Present Illness:  81-yer-old woman who presented to [facility] ED 12/21 as a Code Stroke. LKW 2000 when patient reportedly was in bed and became less responsive with +aphasia and R-sided weakness. One episode of nausea/vomiting en route with EMS. PMHx significant for HTN, chronic sacroiliitis (managed with Percocet), RLS, osteopenia.  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 Labs were grossly unremarkable with WBC mildly elevated to 12 and mild hyperglycemia to 154. Decision was made to intubate patient in ED after several episodes of emesis with concern for aspiration. HTS 3% was initiated.  Stroke service to admit. NSGY consulted with no plan for intervention at this time.  PCCM consulted for ventilator/hemodynamic management.   Pertinent Medical History:   Past Medical History:  Diagnosis Date   Benign essential HTN 11/29/2022   Osteopenia 11/29/2022   RLS (restless legs syndrome) 11/29/2022   Sacroiliitis (HCC) 11/29/2022   Significant Hospital Events: Including procedures, antibiotic start and stop dates in addition to other pertinent events   12/21 - Presented to [facility] ED via EMS as Code Stroke. LKW 2000. Abrupt onset of decreased responsiveness, aphasia and R hemiplegia. CT Head with large L hemispheric parietal/temporal lobe IPH with extension into basal ganglia and IVH. HTS 3% started. Stroke admitting, NSGY consulted. PCCM consult for vent management.  Interim History / Subjective:   No acute events overnight Remains intubated   Objective:  Blood pressure 131/77,  pulse 77, temperature 98.8 F (37.1 C), resp. rate 17, height 5\' 1"  (1.549 m), weight 58.3 kg, SpO2 98 %.    Vent Mode: PRVC FiO2 (%):  [30 %] 30 % Set Rate:  [16 bmp] 16 bmp Vt Set:  [330 mL] 330 mL PEEP:  [5 cmH20] 5 cmH20 Plateau Pressure:  [13 cmH20] 13 cmH20   Intake/Output Summary (Last 24 hours) at 12/02/2022 0744 Last data filed at 12/02/2022 0741 Gross per 24 hour  Intake 3267.55 ml  Output 1865 ml  Net 1402.55 ml   Filed Weights   11/29/22 2100 12/02/22 0458  Weight: 57.8 kg 58.3 kg   Physical Examination: General: elderly woman, Intubated HEENT: [place]/AT, anicteric sclera, PERRL 26mm, moist mucous membranes. ETT/OGT in place. Neuro:  Does not respond to verbal, tactile or noxious stimuli. Does not withdraw to pain. Not following commands. No spontaneous movement of extremities noted. No cough/gag. CV: RRR, no m/g/r. PULM: clear to auscultation. No wheezing. GI: Soft, nontender, nondistended. Normoactive bowel sounds. Extremities: No LE edema noted. Skin: Warm/dry, no rashes.  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  - Management per Stroke team (primary), Goal SBP 130-150 - HTS 3% titrated to Na 150-155 - Additional brain imaging per Neuro - Neuroprotective measures: HOB > 30 degrees, normoglycemia, normothermia, electrolytes WNL   Acute hypoxemic respiratory failure in the setting of IPH/IVH Concern for aspiration PNA - Continue full vent support (  4-8cc/kg IBW) - Wean FiO2 for O2 sat > 90% - Daily WUA/SBT when appropriate from a mental status standpoint - VAP bundle - Pulmonary hygiene - Unasyn for aspiration coverage (5 day course), given emesis  peri-intubation  RLS Chronic sacroiliitis Osteopenia - Hold home Mirapex, Percocet  Best Practice: (right click and "Reselect all SmartList Selections" daily)   Diet/type: tubefeeds DVT prophylaxis: SCDs, pharmacologic ppx contraindicated in the setting of IPH GI prophylaxis: PPI Lines: N/A, may need central access for ongoing HTS Foley:  Yes, and it is still needed Code Status:  full code Last date of multidisciplinary goals of care discussion [12/22 Full code per daughter]  Labs:  CBC: Recent Labs  Lab 11/29/22 2147 11/29/22 2150 11/29/22 2324 11/30/22 0606 12/01/22 0645 12/02/22 0444  WBC 12.0*  --   --  9.6 15.4* 13.5*  NEUTROABS 4.8  --   --   --   --   --   HGB 13.9 14.6 13.9 13.2 12.9 11.1*  HCT 40.4 43.0 41.0 39.4 41.9 33.5*  MCV 92.9  --   --  96.6 105.3* 99.7  PLT 216  --   --  159 102* 102*   Basic Metabolic Panel: Recent Labs  Lab 11/29/22 2147 11/29/22 2150 11/29/22 2320 11/29/22 2324 11/30/22 0924 11/30/22 1039 12/01/22 0645 12/01/22 1034 12/01/22 1627 12/01/22 2326 12/02/22 0444  NA 135 134*   < > 134* 147*   < > 166* 166* 159* 158* 154*  K 3.5 3.7  --  3.4* 3.2*  --  4.1  --   --   --  2.6*  CL 100 98  --   --  116*  --  >130*  --   --   --  124*  CO2 21*  --   --   --  18*  --  13*  --   --   --  20*  GLUCOSE 154* 153*  --   --  108*  --  129*  --   --   --  145*  BUN 12 13  --   --  11  --  15  --   --   --  21  CREATININE 0.96 0.80  --   --  0.86  --  1.16*  --   --   --  1.06*  CALCIUM 9.8  --   --   --  8.1*  --  8.4*  --   --   --  8.6*  MG  --   --   --   --  1.8  --   --   --  1.8  --  1.9  PHOS  --   --   --   --  2.6  --   --   --  1.4*  --  1.4*   < > = values in this interval not displayed.   GFR: Estimated Creatinine Clearance: 35.3 mL/min (A) (by C-G formula based on SCr of 1.06 mg/dL (H)). Recent Labs  Lab 11/29/22 2147 11/30/22 0606 12/01/22 0645 12/02/22 0444  WBC 12.0* 9.6 15.4* 13.5*   Liver Function  Tests: Recent Labs  Lab 11/29/22 2147  AST 40  ALT 16  ALKPHOS 59  BILITOT 0.3  PROT 7.4  ALBUMIN 4.3   No results for input(s): "LIPASE", "AMYLASE" in the last 168 hours. No results for input(s): "AMMONIA" in the last 168 hours.  ABG:    Component Value Date/Time   PHART 7.398  11/29/2022 2324   PCO2ART 41.6 11/29/2022 2324   PO2ART 152 (H) 11/29/2022 2324   HCO3 25.8 11/29/2022 2324   TCO2 27 11/29/2022 2324   O2SAT 99 11/29/2022 2324    Coagulation Profile: Recent Labs  Lab 11/29/22 2147 11/30/22 0606  INR 1.0 1.1   Cardiac Enzymes: No results for input(s): "CKTOTAL", "CKMB", "CKMBINDEX", "TROPONINI" in the last 168 hours.  HbA1C: No results found for: "HGBA1C"  CBG: Recent Labs  Lab 11/29/22 2147 12/01/22 2012 12/01/22 2308 12/02/22 0324  GLUCAP 169* 138* 131* 134*     Critical care time: 32 minutes   Melody Comas, MD [facility] Office: [phone]   See Amion for personal pager PCCM on call pager [phone] until 7pm. Please call Elink 7p-7a. [phone]

## 2022-12-03 ENCOUNTER — Inpatient Hospital Stay (HOSPITAL_COMMUNITY): Payer: Medicare PPO

## 2022-12-03 DIAGNOSIS — J9601 Acute respiratory failure with hypoxia: Secondary | ICD-10-CM | POA: Diagnosis not present

## 2022-12-03 DIAGNOSIS — T17908A Unspecified foreign body in respiratory tract, part unspecified causing other injury, initial encounter: Secondary | ICD-10-CM | POA: Diagnosis not present

## 2022-12-03 DIAGNOSIS — I611 Nontraumatic intracerebral hemorrhage in hemisphere, cortical: Secondary | ICD-10-CM | POA: Diagnosis not present

## 2022-12-03 DIAGNOSIS — G936 Cerebral edema: Secondary | ICD-10-CM | POA: Diagnosis not present

## 2022-12-03 LAB — GLUCOSE, CAPILLARY
Glucose-Capillary: 108 mg/dL — ABNORMAL HIGH (ref 70–99)
Glucose-Capillary: 114 mg/dL — ABNORMAL HIGH (ref 70–99)
Glucose-Capillary: 130 mg/dL — ABNORMAL HIGH (ref 70–99)
Glucose-Capillary: 134 mg/dL — ABNORMAL HIGH (ref 70–99)
Glucose-Capillary: 141 mg/dL — ABNORMAL HIGH (ref 70–99)
Glucose-Capillary: 154 mg/dL — ABNORMAL HIGH (ref 70–99)

## 2022-12-03 LAB — BASIC METABOLIC PANEL
Anion gap: 8 (ref 5–15)
Anion gap: 9 (ref 5–15)
BUN: 28 mg/dL — ABNORMAL HIGH (ref 8–23)
BUN: 26 mg/dL — ABNORMAL HIGH (ref 8–23)
CO2: 22 mmol/L (ref 22–32)
CO2: 23 mmol/L (ref 22–32)
Calcium: 8.1 mg/dL — ABNORMAL LOW (ref 8.9–10.3)
Calcium: 8.1 mg/dL — ABNORMAL LOW (ref 8.9–10.3)
Chloride: 125 mmol/L — ABNORMAL HIGH (ref 98–111)
Chloride: 122 mmol/L — ABNORMAL HIGH (ref 98–111)
Creatinine, Ser: 0.8 mg/dL (ref 0.44–1.00)
Creatinine, Ser: 0.67 mg/dL (ref 0.44–1.00)
GFR, Estimated: >60 mL/min (ref >60)
GFR, Estimated: >60 mL/min (ref >60)
Glucose, Bld: 147 mg/dL — ABNORMAL HIGH (ref 70–99)
Glucose, Bld: 141 mg/dL — ABNORMAL HIGH (ref 70–99)
Potassium: 2.9 mmol/L — ABNORMAL LOW (ref 3.5–5.1)
Potassium: 3 mmol/L — ABNORMAL LOW (ref 3.5–5.1)
Sodium: 155 mmol/L — ABNORMAL HIGH (ref 135–145)
Sodium: 154 mmol/L — ABNORMAL HIGH (ref 135–145)

## 2022-12-03 LAB — CBC
HCT: 29.9 % — ABNORMAL LOW (ref 36.0–46.0)
Hemoglobin: 10.1 g/dL — ABNORMAL LOW (ref 12.0–15.0)
MCH: 32.6 pg (ref 26.0–34.0)
MCHC: 33.8 g/dL (ref 30.0–36.0)
MCV: 96.5 fL (ref 80.0–100.0)
Platelets: 86 10*3/uL — ABNORMAL LOW (ref 150–400)
RBC: 3.1 MIL/uL — ABNORMAL LOW (ref 3.87–5.11)
RDW: 14.2 % (ref 11.5–15.5)
WBC: 12.3 10*3/uL — ABNORMAL HIGH (ref 4.0–10.5)
nRBC: 0.2 % (ref 0.0–0.2)

## 2022-12-03 LAB — MAGNESIUM: Magnesium: 1.8 mg/dL (ref 1.7–2.4)

## 2022-12-03 LAB — TRIGLYCERIDES: Triglycerides: 103 mg/dL (ref <150)

## 2022-12-03 LAB — SODIUM
Sodium: 156 mmol/L — ABNORMAL HIGH (ref 135–145)
Sodium: 153 mmol/L — ABNORMAL HIGH (ref 135–145)
Sodium: 152 mmol/L — ABNORMAL HIGH (ref 135–145)

## 2022-12-03 LAB — PHOSPHORUS: Phosphorus: 2 mg/dL — ABNORMAL LOW (ref 2.5–4.6)

## 2022-12-03 MED ORDER — MAGNESIUM SULFATE 2 GM/50ML IV SOLN
2.0000 g | Freq: Once | INTRAVENOUS | Status: AC
  Administered 2022-12-03: 2 g via INTRAVENOUS
  Filled 2022-12-03: qty 50

## 2022-12-03 MED ORDER — POTASSIUM CHLORIDE 20 MEQ PO PACK
60.0000 meq | PACK | Freq: Two times a day (BID) | ORAL | Status: DC
  Administered 2022-12-03: 60 meq
  Filled 2022-12-03: qty 3

## 2022-12-03 MED ORDER — ADENOSINE 6 MG/2ML IV SOLN
INTRAVENOUS | Status: AC
  Filled 2022-12-03: qty 2

## 2022-12-03 MED ORDER — AMIODARONE HCL IN DEXTROSE 360-4.14 MG/200ML-% IV SOLN
INTRAVENOUS | Status: AC
  Filled 2022-12-03: qty 200

## 2022-12-03 MED ORDER — POTASSIUM PHOSPHATES 15 MMOLE/5ML IV SOLN
30.0000 mmol | Freq: Once | INTRAVENOUS | Status: AC
  Administered 2022-12-03: 30 mmol via INTRAVENOUS
  Filled 2022-12-03: qty 10

## 2022-12-03 NOTE — Progress Notes (Signed)
STROKE TEAM PROGRESS NOTE   INTERVAL HISTORY No family is at the bedside. Pt still intubated, open eyes on voice, not following commands, not tracking. Palliative care on board, family meeting has been arranged.   Vitals:   12/03/22 1000 12/03/22 1100 12/03/22 1200 12/03/22 1300  BP: 122/68 129/64 132/64 (!) 147/81  Pulse: 72 72 72 92  Resp: 16 17 18 18   Temp: 99.9 F (37.7 C) 99.5 F (37.5 C) 99.7 F (37.6 C) 99.5 F (37.5 C)  TempSrc:      SpO2: 98% 98% 98% 98%  Weight:      Height:       CBC:  Recent Labs  Lab 11/29/22 2147 11/29/22 2150 12/02/22 0444 12/03/22 0601  WBC 12.0*   < > 13.5* 12.3*  NEUTROABS 4.8  --   --   --   HGB 13.9   < > 11.1* 10.1*  HCT 40.4   < > 33.5* 29.9*  MCV 92.9   < > 99.7 96.5  PLT 216   < > 102* 86*   < > = values in this interval not displayed.   Basic Metabolic Panel:  Recent Labs  Lab 12/02/22 0444 12/02/22 1228 12/02/22 1832 12/02/22 2227 12/03/22 0601 12/03/22 1056  NA 154*   < > 155*   < > 155* 156*  K 2.6*  --   --   --  2.9*  --   CL 124*  --   --   --  125*  --   CO2 20*  --   --   --  22  --   GLUCOSE 145*  --   --   --  147*  --   BUN 21  --   --   --  28*  --   CREATININE 1.06*  --   --   --  0.80  --   CALCIUM 8.6*  --   --   --  8.1*  --   MG 1.9  --  1.8  --  1.8  --   PHOS 1.4*  --  2.7  --  2.0*  --    < > = values in this interval not displayed.   Lipid Panel:  Recent Labs  Lab 12/01/22 0645 12/03/22 0601  CHOL 178  --   TRIG 64 103  HDL 58  --   CHOLHDL 3.1  --   VLDL 13  --   LDLCALC 107*  --    Alcohol Level  Recent Labs  Lab 11/29/22 2147  ETH <10    IMAGING past 24 hours No results found.  PHYSICAL EXAM General: Intubated elderly patient in no acute distress Respiratory: intubated on vent Neurological: intubated not on sedation, eyes open on voice, not following commands. With forced eye opening, eyes midline, not tracking, not blinking to visual threat on the right but blinking on  the left, PERRL. Corneal reflex present, gag and cough present. Breathing over the vent.  Facial symmetry not able to test due to ET tube.  Tongue protrusion not cooperative. On pain stimulation, no movement on the RUE, slight withdraw at RLE, spontaneous movement of LUE and LLE. DTR 1+ and bilateral positive babinski. Sensation, coordination and gait not tested.   ASSESSMENT/PLAN Pamela Johnston is a 81 y.o. female with history of hypertension and restless leg syndrome presenting with acute onset aphasia and right-sided weakness.  CT head on admission revealed very large left parietal ICH.  Neurosurgery was  consulted, but it was not felt that neurosurgical intervention would be helpful to patient.  Prognosis is poor, and goals of care discussions with family are ongoing.  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  Palliative care on board for GOC discussion  Cerebral edema Severe cytotoxic edema, trans falcine and downward brain herniation seen on CT Developing hydrocephalus seen on CT, neurosurgery declines to intervene as this will not change her prognosis Na 166->154->156 3% saline off -> NS @ 35 with TF Na Q6h CT repeat pending  Hypertension Home meds: None Stable but on low end  Systolic blood pressure goal < 160 Long-term BP goal  normotensive  Hyperlipidemia Home meds: None LDL 107., goal < 70 High intensity statin not indicated secondary to ICH Continue statin at discharge  Respiratory failure Fever  Patient was intubated for airway protection Ventilator management per CCM Tmax 100.4 On unasyn   Other Stroke Risk Factors Advanced Age >/= 57   Other Active Problems Leukocytosis WBC 12.3  Hospital day # 4  Marvel Plan, MD PhD Stroke Neurology 12/03/2022 2:02 PM  This patient is critically ill due to large ICH and IVH, cerebral edema, hyponatremia and at significant risk of neurological worsening, death form brain herniation, brain death, hydrocephalus, seizure. This patient's care requires constant monitoring of vital signs, hemodynamics, respiratory and cardiac monitoring, review of multiple databases, neurological assessment, discussion with family, other specialists and medical decision making of high complexity. I spent 30 minutes of neurocritical care time in the care of this patient.    To contact Stroke Continuity provider, please refer to [email or web address]. After hours, contact General Neurology

## 2022-12-03 NOTE — Progress Notes (Signed)
NAME:  Pamela Johnston, MRN:  [id number], DOB:  04-22-1941, LOS: 4 ADMISSION DATE:  11/29/2022 CONSULTATION DATE:  11/29/2022 REFERRING MD:  Amada Jupiter - Neuro CHIEF COMPLAINT:  IPH, vent management   History of Present Illness:  81-yer-old woman who presented to [facility] ED 12/21 as a Code Stroke. LKW 2000 when patient reportedly was in bed and became less responsive with +aphasia and R-sided weakness. One episode of nausea/vomiting en route with EMS. PMHx significant for HTN, chronic sacroiliitis (managed with Percocet), RLS, osteopenia.  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 Labs were grossly unremarkable with WBC mildly elevated to 12 and mild hyperglycemia to 154. Decision was made to intubate patient in ED after several episodes of emesis with concern for aspiration. HTS 3% was initiated.  Stroke service to admit. NSGY consulted with no plan for intervention at this time.  PCCM consulted for ventilator/hemodynamic management.   Pertinent Medical History:   Past Medical History:  Diagnosis Date   Benign essential HTN 11/29/2022   Osteopenia 11/29/2022   RLS (restless legs syndrome) 11/29/2022   Sacroiliitis (HCC) 11/29/2022   Significant Hospital Events: Including procedures, antibiotic start and stop dates in addition to other pertinent events   12/21 - Presented to [facility] ED via EMS as Code Stroke. LKW 2000. Abrupt onset of decreased responsiveness, aphasia and R hemiplegia. CT Head with large L hemispheric parietal/temporal lobe IPH with extension into basal ganglia and IVH. HTS 3% started. Stroke admitting, NSGY consulted. PCCM consult for vent management.  Interim History / Subjective:   No acute events overnight Remains intubated   Objective:  Blood pressure 136/70,  pulse 67, temperature 99.7 F (37.6 C), resp. rate 18, height 5\' 1"  (1.549 m), weight 58.3 kg, SpO2 99 %.    Vent Mode: PRVC FiO2 (%):  [30 %] 30 % Set Rate:  [16 bmp] 16 bmp Vt Set:  [330 mL] 330 mL PEEP:  [5 cmH20] 5 cmH20 Pressure Support:  [5 cmH20] 5 cmH20 Plateau Pressure:  [11 cmH20] 11 cmH20   Intake/Output Summary (Last 24 hours) at 12/03/2022 0749 Last data filed at 12/03/2022 0200 Gross per 24 hour  Intake 3463.35 ml  Output 535 ml  Net 2928.35 ml   Filed Weights   11/29/22 2100 12/02/22 0458  Weight: 57.8 kg 58.3 kg   Physical Examination: General: elderly woman, Intubated HEENT: [place]/AT, anicteric sclera, PERRL 46mm, moist mucous membranes. ETT/OGT in place. Neuro:  Not following commands. No spontaneous movement of extremities noted. +cough with deep suction CV: RRR, no m/g/r. PULM: clear to auscultation. No wheezing. GI: Soft, nontender, nondistended. Normoactive bowel sounds. Extremities: No LE edema noted. Skin: Warm/dry, no rashes.  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  - Management per Stroke team (primary), Goal SBP 130-150 - HTS 3% titrated to Na 150-155 - Additional brain imaging per Neuro - Neuroprotective measures: HOB > 30 degrees, normoglycemia, normothermia, electrolytes WNL  - Plant for repeat CT Head today  Acute hypoxemic respiratory failure in the setting of IPH/IVH Concern for aspiration PNA - Continue full  vent support (4-8cc/kg IBW) - Wean FiO2 for O2 sat > 90% - Daily WUA/SBT when appropriate from a mental status standpoint - VAP bundle - Pulmonary hygiene - Unasyn for aspiration coverage (5 day course), given emesis  peri-intubation  RLS Chronic sacroiliitis Osteopenia - Hold home Mirapex, Percocet  Best Practice: (right click and "Reselect all SmartList Selections" daily)   Diet/type: tubefeeds DVT prophylaxis: SCDs, pharmacologic ppx contraindicated in the setting of IPH GI prophylaxis: PPI Lines: N/A, may need central access for ongoing HTS Foley:  Yes, and it is still needed Code Status:  full code Last date of multidisciplinary goals of care discussion [12/22 Full code per daughter]  Labs:  CBC: Recent Labs  Lab 11/29/22 2147 11/29/22 2150 11/29/22 2324 11/30/22 0606 12/01/22 0645 12/02/22 0444 12/03/22 0601  WBC 12.0*  --   --  9.6 15.4* 13.5* 12.3*  NEUTROABS 4.8  --   --   --   --   --   --   HGB 13.9   < > 13.9 13.2 12.9 11.1* 10.1*  HCT 40.4   < > 41.0 39.4 41.9 33.5* 29.9*  MCV 92.9  --   --  96.6 105.3* 99.7 96.5  PLT 216  --   --  159 102* 102* 86*   < > = values in this interval not displayed.   Basic Metabolic Panel: Recent Labs  Lab 11/29/22 2147 11/29/22 2150 11/29/22 2320 11/29/22 2324 11/30/22 0924 11/30/22 1039 12/01/22 0645 12/01/22 1034 12/01/22 1627 12/01/22 2326 12/02/22 0444 12/02/22 1228 12/02/22 1832 12/02/22 2227 12/03/22 0601  NA 135 134*   < > 134* 147*   < > 166*   < > 159*   < > 154* 157* 155* 155* 155*  K 3.5 3.7  --  3.4* 3.2*  --  4.1  --   --   --  2.6*  --   --   --  2.9*  CL 100 98  --   --  116*  --  >130*  --   --   --  124*  --   --   --  125*  CO2 21*  --   --   --  18*  --  13*  --   --   --  20*  --   --   --  22  GLUCOSE 154* 153*  --   --  108*  --  129*  --   --   --  145*  --   --   --  147*  BUN 12 13  --   --  11  --  15  --   --   --  21  --   --   --  28*  CREATININE 0.96 0.80  --   --  0.86  --  1.16*  --   --   --  1.06*  --   --   --  0.80  CALCIUM 9.8  --   --   --  8.1*  --  8.4*  --   --   --  8.6*  --   --   --  8.1*  MG  --   --   --   --  1.8  --   --   --  1.8  --  1.9  --  1.8  --  1.8  PHOS  --   --   --    --  2.6  --   --   --  1.4*  --  1.4*  --  2.7  --  2.0*   < > = values in this interval not displayed.   GFR: Estimated Creatinine Clearance: 46.8 mL/min (by C-G formula based on SCr of 0.8 mg/dL). Recent Labs  Lab 11/30/22 0606 12/01/22 0645 12/02/22 0444 12/03/22 0601  WBC 9.6 15.4* 13.5* 12.3*   Liver Function Tests: Recent Labs  Lab 11/29/22 2147  AST 40  ALT 16  ALKPHOS 59  BILITOT 0.3  PROT 7.4  ALBUMIN 4.3   No results for input(s): "LIPASE", "AMYLASE" in the last 168 hours. No results for input(s): "AMMONIA" in the last 168 hours.  ABG:    Component Value Date/Time   PHART 7.398 11/29/2022 2324   PCO2ART 41.6 11/29/2022 2324   PO2ART 152 (H) 11/29/2022 2324   HCO3 25.8 11/29/2022 2324   TCO2 27 11/29/2022 2324   O2SAT 99 11/29/2022 2324    Coagulation Profile: Recent Labs  Lab 11/29/22 2147 11/30/22 0606  INR 1.0 1.1   Cardiac Enzymes: No results for input(s): "CKTOTAL", "CKMB", "CKMBINDEX", "TROPONINI" in the last 168 hours.  HbA1C: No results found for: "HGBA1C"  CBG: Recent Labs  Lab 12/02/22 1147 12/02/22 1558 12/02/22 1930 12/02/22 2327 12/03/22 0328  GLUCAP 108* 141* 151* 140* 114*     Critical care time: 32 minutes   Melody Comas, MD [facility] Office: [phone]   See Amion for personal pager PCCM on call pager [phone] until 7pm. Please call Elink 7p-7a. [phone]

## 2022-12-03 NOTE — Progress Notes (Signed)
RN to room to administer tylenol for Pt's recent temperature. As RN was in the room Pt HR increased and sustained in the 180's. CCM paged and at bedside.

## 2022-12-03 NOTE — Progress Notes (Signed)
Pt transported to and from 4N20 to CT2 on vent. Pt stable throughout with no complications. VS WNL.

## 2022-12-03 NOTE — Progress Notes (Signed)
Called to bedside with concern for SVT with HR in 180s. EKG confirmed. Planned to give 6mg  adenosine but in getting ready with placing pads, she returned to sinus tachycardia in 110s.   Will check electrolytes.  , MD [facility] Office: [phone]   See Amion for personal pager PCCM on call pager [phone] until 7pm. Please call Elink 7p-7a. [phone]

## 2022-12-03 NOTE — Progress Notes (Signed)
     Referral received for CLAUDETTA SALLIE :goals of care discussion. Chart reviewed and updates received from RN. Patient remains intubated and unresponsive. Poor prognosis.   I was able to speak with husband at the bedside. He is appreciative of knowing we will be involved in discussions and complex decision making. He would like his daughter and son-in-law involved in discussions. Unfortunately we were unable to meet. We discussed making contact on yesterday to arrange for a time today however this did not happen. He expressed he thought his daughter had called. Attempted to reach out today however no answer. Mr. Weitman feels that maybe tomorrow would be a better day for his daughter allowing discussions to be after Christmas. Acknowledge his request. Contact information was provided to daughter and son-in-law on yesterday. I have also written information on whiteboard in room.   PMT will re-attempt to contact family at a later time/date. Detailed note and recommendations to follow once GOC discussions have been completed.   Thank you for your referral and allowing PMT to assist in Mrs. Adelei Scobey Lonon's care.   Willette Alma, AGPCNP-BC Palliative Medicine Team  Phone: [phone] Amion: N. Cousar   NO CHARGE

## 2022-12-04 ENCOUNTER — Inpatient Hospital Stay (HOSPITAL_COMMUNITY): Payer: Medicare PPO

## 2022-12-04 DIAGNOSIS — I611 Nontraumatic intracerebral hemorrhage in hemisphere, cortical: Secondary | ICD-10-CM | POA: Diagnosis not present

## 2022-12-04 DIAGNOSIS — R509 Fever, unspecified: Secondary | ICD-10-CM

## 2022-12-04 DIAGNOSIS — T17908A Unspecified foreign body in respiratory tract, part unspecified causing other injury, initial encounter: Secondary | ICD-10-CM | POA: Diagnosis not present

## 2022-12-04 DIAGNOSIS — I619 Nontraumatic intracerebral hemorrhage, unspecified: Secondary | ICD-10-CM | POA: Diagnosis not present

## 2022-12-04 LAB — CBC
HCT: 32 % — ABNORMAL LOW (ref 36.0–46.0)
Hemoglobin: 10.6 g/dL — ABNORMAL LOW (ref 12.0–15.0)
MCH: 31.6 pg (ref 26.0–34.0)
MCHC: 33.1 g/dL (ref 30.0–36.0)
MCV: 95.5 fL (ref 80.0–100.0)
Platelets: 76 10*3/uL — ABNORMAL LOW (ref 150–400)
RBC: 3.35 MIL/uL — ABNORMAL LOW (ref 3.87–5.11)
RDW: 13.8 % (ref 11.5–15.5)
WBC: 12.2 10*3/uL — ABNORMAL HIGH (ref 4.0–10.5)
nRBC: 0.4 % — ABNORMAL HIGH (ref 0.0–0.2)

## 2022-12-04 LAB — GLUCOSE, CAPILLARY
Glucose-Capillary: 150 mg/dL — ABNORMAL HIGH (ref 70–99)
Glucose-Capillary: 151 mg/dL — ABNORMAL HIGH (ref 70–99)
Glucose-Capillary: 121 mg/dL — ABNORMAL HIGH (ref 70–99)
Glucose-Capillary: 132 mg/dL — ABNORMAL HIGH (ref 70–99)
Glucose-Capillary: 107 mg/dL — ABNORMAL HIGH (ref 70–99)
Glucose-Capillary: 105 mg/dL — ABNORMAL HIGH (ref 70–99)

## 2022-12-04 LAB — BASIC METABOLIC PANEL
Anion gap: 7 (ref 5–15)
BUN: 27 mg/dL — ABNORMAL HIGH (ref 8–23)
CO2: 23 mmol/L (ref 22–32)
Calcium: 7.5 mg/dL — ABNORMAL LOW (ref 8.9–10.3)
Chloride: 119 mmol/L — ABNORMAL HIGH (ref 98–111)
Creatinine, Ser: 0.85 mg/dL (ref 0.44–1.00)
GFR, Estimated: >60 mL/min (ref >60)
Glucose, Bld: 166 mg/dL — ABNORMAL HIGH (ref 70–99)
Potassium: 3.8 mmol/L (ref 3.5–5.1)
Sodium: 149 mmol/L — ABNORMAL HIGH (ref 135–145)

## 2022-12-04 LAB — PHOSPHORUS: Phosphorus: 2.3 mg/dL — ABNORMAL LOW (ref 2.5–4.6)

## 2022-12-04 LAB — URINALYSIS, COMPLETE (UACMP) WITH MICROSCOPIC
Bilirubin Urine: NEGATIVE
Glucose, UA: NEGATIVE mg/dL
Hgb urine dipstick: NEGATIVE
Ketones, ur: NEGATIVE mg/dL
Leukocytes,Ua: NEGATIVE
Nitrite: NEGATIVE
Protein, ur: 30 mg/dL — AB
Specific Gravity, Urine: 1.024 (ref 1.005–1.030)
pH: 6 (ref 5.0–8.0)

## 2022-12-04 LAB — MAGNESIUM: Magnesium: 2.2 mg/dL (ref 1.7–2.4)

## 2022-12-04 LAB — SODIUM
Sodium: 152 mmol/L — ABNORMAL HIGH (ref 135–145)
Sodium: 152 mmol/L — ABNORMAL HIGH (ref 135–145)

## 2022-12-04 MED ORDER — POTASSIUM CHLORIDE 10 MEQ/100ML IV SOLN
10.0000 meq | INTRAVENOUS | Status: AC
  Administered 2022-12-04 (×4): 10 meq via INTRAVENOUS
  Filled 2022-12-04 (×4): qty 100

## 2022-12-04 MED ORDER — LATANOPROST 0.005 % OP SOLN
1.0000 [drp] | Freq: Every day | OPHTHALMIC | Status: DC
  Administered 2022-12-05 – 2023-02-26 (×84): 1 [drp] via OPHTHALMIC
  Filled 2022-12-04 (×3): qty 2.5

## 2022-12-04 MED ORDER — LABETALOL HCL 5 MG/ML IV SOLN
10.0000 mg | INTRAVENOUS | Status: DC | PRN
  Administered 2022-12-04: 10 mg via INTRAVENOUS
  Filled 2022-12-04: qty 4

## 2022-12-04 MED ORDER — FAMOTIDINE 40 MG/5ML PO SUSR
20.0000 mg | Freq: Every day | ORAL | Status: DC
  Administered 2022-12-04 – 2023-02-26 (×85): 20 mg
  Filled 2022-12-04 (×89): qty 2.5

## 2022-12-04 MED ORDER — POTASSIUM & SODIUM PHOSPHATES 280-160-250 MG PO PACK
2.0000 | PACK | Freq: Three times a day (TID) | ORAL | Status: AC
  Administered 2022-12-04 (×2): 2
  Filled 2022-12-04 (×2): qty 2

## 2022-12-04 MED ORDER — LISINOPRIL 20 MG PO TABS
20.0000 mg | ORAL_TABLET | Freq: Every day | ORAL | Status: DC
  Administered 2022-12-04 – 2022-12-14 (×7): 20 mg
  Filled 2022-12-04 (×10): qty 1

## 2022-12-04 MED ORDER — AMLODIPINE BESYLATE 10 MG PO TABS
10.0000 mg | ORAL_TABLET | Freq: Every day | ORAL | Status: DC
  Administered 2022-12-04 – 2022-12-14 (×7): 10 mg
  Filled 2022-12-04 (×10): qty 1

## 2022-12-04 NOTE — Progress Notes (Signed)
SLP Cancellation Note  Patient Details Name: SHERRIA RIEMANN MRN: [id number] DOB: 1941-07-26   Cancelled treatment:       Reason Eval/Treat Not Completed: Medical issues which prohibited therapy;Other (comment) (patient remains intubated, SLP will follow up after Palliative meeting with family)   Angela Nevin, MA, CCC-SLP Speech Therapy

## 2022-12-04 NOTE — Progress Notes (Signed)
eLink Physician-Brief Progress Note Patient Name: Pamela Johnston DOB: 1941/08/28 MRN: [id number]   Date of Service  12/04/2022  HPI/Events of Note  Notified now of K of 3.0 that was checked around 7 pm . Not replaced  eICU Interventions  40 meq iv kcl ordered      Intervention Category Major Interventions: Electrolyte abnormality - evaluation and management  Oretha Milch 12/04/2022, 2:28 AM

## 2022-12-04 NOTE — IPAL (Signed)
  Interdisciplinary Goals of Care Family Meeting   Date carried out:: 12/04/2022  Location of the meeting: Unit  Member's involved: Physician and Family Member or next of kin  Durable Power of Attorney or acting medical decision maker: Husband, daughter, son in law    Discussion: We discussed goals of care for Pamela Johnston .  I explained that she has a devastating brain bleed with significant midline shift and has made no neurologic recovery.  Further I explained that there is no medical or surgical treatment that we can offer which will provide a reasonable opportunity for neurologic recovery.  I recommended withdrawal of care.  The patient's daughter and son in law will consider the situation and get back to [place].  They expressed a strong faith that a miraculous recovery can happen and are hoping for that.   Code status: Full Code  Disposition: Continue current acute care   Time spent for the meeting: 14 minutes  Heber Heuvelton 12/04/2022, 3:45 PM

## 2022-12-04 NOTE — Progress Notes (Signed)
NAME:  Pamela Johnston, MRN:  [id number], DOB:  21-Feb-1941, LOS: 5 ADMISSION DATE:  11/29/2022, CONSULTATION DATE:  12/21 REFERRING MD:  Amada Jupiter, REASON FOR CONSULT:   ventilator management in setting of large intraparenchymal hemorrage  Brief summary:  81 y/o female admitted on 12/21 with large L parietal, temporal acute intracerebral hemorrhage with extension in to basal ganglia and intrventricular hemorrhage with significant vasogenic edema and mass effect.  Intubated, given hypertonic saline.  NSGY felt no intervention would be helpful.  Prognosis poor, goals of care discussions ongoing.  Pertinent  Medical History  Hypertension Sacroilititis Restless legs syndrome osteopenia  Significant Hospital Events: Including procedures, antibiotic start and stop dates in addition to other pertinent events   12/21 - Presented to [facility] ED via EMS as Code Stroke. LKW 2000. Abrupt onset of decreased responsiveness, aphasia and R hemiplegia. CT Head with large L hemispheric parietal/temporal lobe IPH with extension into basal ganglia and IVH. HTS 3% started. Stroke admitting, NSGY consulted. PCCM consult for vent management. Treated with unasyn for aspiration pneumonia  12/24 palliative care consulted, family requested meeting to be delayed 12/25 Family requested goals of care meeting to be delayed 12/26 stopped unasyn, replete phos  Interim History / Subjective:  SVT last night, broke spontaneously Low grade temp overnight Vomited, tube feeding held  Objective   Blood pressure 129/63, pulse 74, temperature (!) 100.4 F (38 C), temperature source Bladder, resp. rate 20, height 5\' 1"  (1.549 m), weight 58.3 kg, SpO2 94 %.    Vent Mode: PRVC FiO2 (%):  [30 %] 30 % Set Rate:  [16 bmp] 16 bmp Vt Set:  [330 mL] 330 mL PEEP:  [5 cmH20] 5 cmH20 Plateau Pressure:  [15 cmH20] 15 cmH20   Intake/Output Summary (Last 24 hours) at 12/04/2022 0908 Last data filed at 12/04/2022 0800 Gross per 24 hour   Intake 3398.29 ml  Output 2150 ml  Net 1248.29 ml   Filed Weights   11/29/22 2100 12/02/22 0458  Weight: 57.8 kg 58.3 kg    Examination:  General:  In bed on vent HENT: NCAT ETT in place PULM: CTA B, vent supported breathing CV: RRR, no mgr GI: BS+, soft, nontender MSK: normal bulk and tone Neuro: minimally interactive on vent, sedated with fentanyl   Resolved Hospital Problem list   Hypotension  Assessment & Plan:  Large L parietal, 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  Agree with recommendations for comfort measures.  Plan: Full mechanical vent support VAP prevention Daily WUA/SBT as mental status allows Pulm hygiene Stop antibiotics Continue full vent support Hypertonic saline per neuro Secondary stroke prevention per primary Goal SBP 130-150 Glucose goal 140-180  Continue to engage family re goals of care conversations   Best Practice (right click and "Reselect all SmartList Selections" daily)   Diet/type: tubefeeds DVT prophylaxis: SCD GI prophylaxis: H2B Lines: N/A Foley:  Yes, and it is no longer needed Code Status:  full code Last date of multidisciplinary goals of care discussion [today, I updated her husband bedside and he stated that they are waiting to have a conversation with the patient's son in law who is a neurologist]  Critical care time: 32 minutes    08-18-1973, MD  [place] [facility] Pager: [phone] Cell: [phone] After 7:00 pm call Elink  [phone]

## 2022-12-04 NOTE — Progress Notes (Signed)
STROKE TEAM PROGRESS NOTE   INTERVAL HISTORY Husband is at the bedside. Pt still intubated, not open eyes on voice today likely due to fever and encephalopathy, not following commands, not tracking. Family so far asking for full code. Palliative care will have [place] discussion tomorrow.  Vitals:   12/04/22 1600 12/04/22 1700 12/04/22 1800 12/04/22 1940  BP: (!) 158/82 (!) 177/94 136/60   Pulse: 99 (!) 108 67   Resp: (!) 24 (!) 32 20   Temp: (!) 100.8 F (38.2 C) 100.2 F (37.9 C) (!) 100.4 F (38 C)   TempSrc:      SpO2: 94% 93% 91% 92%  Weight:      Height:       CBC:  Recent Labs  Lab 11/29/22 2147 11/29/22 2150 12/03/22 0601 12/04/22 1323  WBC 12.0*   < > 12.3* 12.2*  NEUTROABS 4.8  --   --   --   HGB 13.9   < > 10.1* 10.6*  HCT 40.4   < > 29.9* 32.0*  MCV 92.9   < > 96.5 95.5  PLT 216   < > 86* 76*   < > = values in this interval not displayed.   Basic Metabolic Panel:  Recent Labs  Lab 12/03/22 0601 12/03/22 1056 12/03/22 1848 12/03/22 2248 12/04/22 0539 12/04/22 1323  NA 155*   < > 154*   < > 149* 152*  K 2.9*  --  3.0*  --  3.8  --   CL 125*  --  122*  --  119*  --   CO2 22  --  23  --  23  --   GLUCOSE 147*  --  141*  --  166*  --   BUN 28*  --  26*  --  27*  --   CREATININE 0.80  --  0.67  --  0.85  --   CALCIUM 8.1*  --  8.1*  --  7.5*  --   MG 1.8  --   --   --  2.2  --   PHOS 2.0*  --   --   --  2.3*  --    < > = values in this interval not displayed.   Lipid Panel:  Recent Labs  Lab 12/01/22 0645 12/03/22 0601  CHOL 178  --   TRIG 64 103  HDL 58  --   CHOLHDL 3.1  --   VLDL 13  --   LDLCALC 107*  --    Alcohol Level  Recent Labs  Lab 11/29/22 2147  ETH <10    IMAGING past 24 hours No results found.  PHYSICAL EXAM General: Intubated elderly patient in no acute distress Respiratory: intubated on vent Neurological: intubated not on sedation, eyes open on voice, not following commands. With forced eye opening, eyes midline, not  tracking, not blinking to visual threat on the right but blinking on the left, PERRL. Corneal reflex present, gag and cough present. Breathing over the vent.  Facial symmetry not able to test due to ET tube.  Tongue protrusion not cooperative. On pain stimulation, no movement on the RUE, slight withdraw at RLE, spontaneous movement of LUE and LLE. DTR 1+ and bilateral positive babinski. Sensation, coordination and gait not tested.   ASSESSMENT/PLAN [name] is a 81 y.o. female with history of hypertension and restless leg syndrome presenting with acute onset aphasia and right-sided weakness.  CT head on admission revealed very large left parietal  ICH.  Neurosurgery was consulted, but it was not felt that neurosurgical intervention would be helpful to patient.  Prognosis is poor, and goals of care discussions with family are ongoing.  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  No hydrocephalus 2D Echo EF 60 to 65% LDL 107 HgbA1c pending VTE prophylaxis -SCDs No antithrombotic prior to admission, now on No antithrombotic secondary to ICH Therapy recommendations: Pending Disposition: Pending.  Palliative care on board pending for GOC discussion  Cerebral edema Severe cytotoxic edema, trans falcine and downward brain herniation seen on CT Developing hydrocephalus seen on CT, neurosurgery declines to intervene as this will not change her prognosis Na 166->154->156-149-152 3% saline off -> NS @ 35 with TF Na Q6h CT repeat 12/25  stable hematoma, midline shift 1.2 cm no hydrocephalus.  Hypertension Home meds: None Stable but on low end  Systolic blood pressure goal < 160 Long-term BP goal normotensive  Hyperlipidemia Home meds: None LDL 107, goal < 70 May consider statin at discharge  Respiratory failure Fever  Patient was intubated for airway protection Ventilator management per CCM Still on fentanyl Tmax 100.4->101.3 Leukocytosis WBC 12.3-> 12.2 On unasyn x 5 days -> off UA pending CXR pending  Other Stroke Risk Factors Advanced Age >/= 87   Other Active Problems Dysphagia, OG tube on [facility] day # 5  Marvel Plan, MD PhD Stroke Neurology 12/04/2022 8:08 PM  This patient is critically ill due to large ICH and IVH, cerebral edema, hyponatremia and at significant risk of neurological worsening, death form brain herniation, brain death, hydrocephalus, seizure. This patient's care requires constant monitoring of vital signs, hemodynamics, respiratory and cardiac monitoring, review of multiple databases, neurological assessment, discussion with family, other specialists and medical decision making of high complexity. I spent 30 minutes of neurocritical care time in the care of this patient.    To contact Stroke Continuity provider, please refer to [email or web address]. After hours, contact General Neurology

## 2022-12-05 ENCOUNTER — Inpatient Hospital Stay (HOSPITAL_COMMUNITY): Payer: Medicare PPO

## 2022-12-05 DIAGNOSIS — I611 Nontraumatic intracerebral hemorrhage in hemisphere, cortical: Secondary | ICD-10-CM | POA: Diagnosis not present

## 2022-12-05 DIAGNOSIS — I619 Nontraumatic intracerebral hemorrhage, unspecified: Secondary | ICD-10-CM | POA: Diagnosis not present

## 2022-12-05 DIAGNOSIS — Z515 Encounter for palliative care: Secondary | ICD-10-CM

## 2022-12-05 DIAGNOSIS — R509 Fever, unspecified: Secondary | ICD-10-CM | POA: Diagnosis not present

## 2022-12-05 DIAGNOSIS — Z7189 Other specified counseling: Secondary | ICD-10-CM | POA: Diagnosis not present

## 2022-12-05 DIAGNOSIS — G2581 Restless legs syndrome: Secondary | ICD-10-CM

## 2022-12-05 DIAGNOSIS — R0902 Hypoxemia: Secondary | ICD-10-CM

## 2022-12-05 DIAGNOSIS — I10 Essential (primary) hypertension: Secondary | ICD-10-CM

## 2022-12-05 LAB — CBC
HCT: 29.6 % — ABNORMAL LOW (ref 36.0–46.0)
Hemoglobin: 10.2 g/dL — ABNORMAL LOW (ref 12.0–15.0)
MCH: 32.3 pg (ref 26.0–34.0)
MCHC: 34.5 g/dL (ref 30.0–36.0)
MCV: 93.7 fL (ref 80.0–100.0)
Platelets: 82 10*3/uL — ABNORMAL LOW (ref 150–400)
RBC: 3.16 MIL/uL — ABNORMAL LOW (ref 3.87–5.11)
RDW: 13.7 % (ref 11.5–15.5)
WBC: 10.3 10*3/uL (ref 4.0–10.5)
nRBC: 0.6 % — ABNORMAL HIGH (ref 0.0–0.2)

## 2022-12-05 LAB — BASIC METABOLIC PANEL
Anion gap: 7 (ref 5–15)
BUN: 22 mg/dL (ref 8–23)
CO2: 26 mmol/L (ref 22–32)
Calcium: 8 mg/dL — ABNORMAL LOW (ref 8.9–10.3)
Chloride: 116 mmol/L — ABNORMAL HIGH (ref 98–111)
Creatinine, Ser: 0.77 mg/dL (ref 0.44–1.00)
GFR, Estimated: >60 mL/min (ref >60)
Glucose, Bld: 122 mg/dL — ABNORMAL HIGH (ref 70–99)
Potassium: 3.4 mmol/L — ABNORMAL LOW (ref 3.5–5.1)
Sodium: 149 mmol/L — ABNORMAL HIGH (ref 135–145)

## 2022-12-05 LAB — SODIUM
Sodium: 150 mmol/L — ABNORMAL HIGH (ref 135–145)
Sodium: 149 mmol/L — ABNORMAL HIGH (ref 135–145)

## 2022-12-05 LAB — GLUCOSE, CAPILLARY
Glucose-Capillary: 120 mg/dL — ABNORMAL HIGH (ref 70–99)
Glucose-Capillary: 115 mg/dL — ABNORMAL HIGH (ref 70–99)
Glucose-Capillary: 106 mg/dL — ABNORMAL HIGH (ref 70–99)
Glucose-Capillary: 116 mg/dL — ABNORMAL HIGH (ref 70–99)
Glucose-Capillary: 89 mg/dL (ref 70–99)
Glucose-Capillary: 104 mg/dL — ABNORMAL HIGH (ref 70–99)

## 2022-12-05 MED ORDER — SODIUM CHLORIDE 0.9 % IV SOLN
3.0000 g | Freq: Four times a day (QID) | INTRAVENOUS | Status: DC
  Administered 2022-12-05 – 2022-12-07 (×9): 3 g via INTRAVENOUS
  Filled 2022-12-05 (×9): qty 8

## 2022-12-05 MED ORDER — POTASSIUM CHLORIDE 20 MEQ PO PACK
40.0000 meq | PACK | Freq: Once | ORAL | Status: AC
  Administered 2022-12-05: 40 meq
  Filled 2022-12-05: qty 2

## 2022-12-05 NOTE — Progress Notes (Signed)
NAME:  Pamela Johnston, MRN:  [id number], DOB:  1941/04/24, LOS: 6 ADMISSION DATE:  11/29/2022, CONSULTATION DATE:  12/21 REFERRING MD:  Amada Jupiter, REASON FOR CONSULT:   ventilator management in setting of large intraparenchymal hemorrage  Brief summary:  81 y/o female admitted on 12/21 with large L parietal, temporal acute intracerebral hemorrhage with extension in to basal ganglia and intrventricular hemorrhage with significant vasogenic edema and mass effect.  Intubated, given hypertonic saline.  NSGY felt no intervention would be helpful.  Prognosis poor, goals of care discussions ongoing.  Pertinent  Medical History  Hypertension Sacroilititis Restless legs syndrome osteopenia  Significant Hospital Events: Including procedures, antibiotic start and stop dates in addition to other pertinent events   12/21 - Presented to [facility] ED via EMS as Code Stroke. LKW 2000. Abrupt onset of decreased responsiveness, aphasia and R hemiplegia. CT Head with large L hemispheric parietal/temporal lobe IPH with extension into basal ganglia and IVH. HTS 3% started. Stroke admitting, NSGY consulted. PCCM consult for vent management. Treated with unasyn for aspiration pneumonia  12/24 palliative care consulted, family requested meeting to be delayed 12/25 Family requested goals of care meeting to be delayed 12/26 stopped unasyn, replete phos; Diamonte Stavely GOC conversatoin: family says full code  Interim History / Subjective:   Fever overnight Weaned sedation, had tachypnea  Objective   Blood pressure (!) 142/66, pulse 70, temperature 99.5 F (37.5 C), resp. rate 19, height 5\' 1"  (1.549 m), weight 58.3 kg, SpO2 92 %.    Vent Mode: PRVC FiO2 (%):  [30 %-40 %] 40 % Set Rate:  [16 bmp] 16 bmp Vt Set:  [330 mL] 330 mL PEEP:  [5 cmH20] 5 cmH20   Intake/Output Summary (Last 24 hours) at 12/05/2022 0951 Last data filed at 12/05/2022 0900 Gross per 24 hour  Intake 859.63 ml  Output 1725 ml  Net -865.37 ml    Filed Weights   11/29/22 2100 12/02/22 0458  Weight: 57.8 kg 58.3 kg    Examination:  General:  In bed on vent HENT: NCAT ETT in place PULM: CTA B, vent supported breathing CV: RRR, no mgr GI: BS+, soft, nontender MSK: normal bulk and tone Neuro: stirs spontaneously with minimal fentanyl  12/26 CXR > RLL infiltrate, bilateral effusions  Resolved Hospital Problem list   Hypotension  Assessment & Plan:  Large L parietal, 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  Agree with recommendations for comfort measures.  Plan: Full mechanical vent support VAP prevention Daily WUA/SBT Pulm hygiene Restart unasyn Secondary stroke prevention per primary Hypertonic saline per neuro Goal SBP 130-150 Glucose goal 140-180  Continue to engage family re: goals of care conversations   Best Practice (right click and "Reselect all SmartList Selections" daily)   Diet/type: tubefeeds DVT prophylaxis: SCD GI prophylaxis: H2B Lines: N/A Foley:  Yes, and it is no longer needed Code Status:  full code Last date of multidisciplinary goals of care discussion [updated husband bedside on 12/27.  He indicates that the family prefers to wait "a while" and that the family is resolute in their decision to not "pull the plug."  Support offered, let him know palliative care has been consulted.]  Critical care time: 32 minutes  Roselie Awkward, MD [place] [facility] Pager: [phone] Cell: [phone] After 7:00 pm call Elink  [phone]

## 2022-12-05 NOTE — Progress Notes (Signed)
STROKE TEAM PROGRESS NOTE   INTERVAL HISTORY No family is at the bedside. Pt still intubated, with fever, on Abx. More drowsy sleepy and worsened mental status than yesterday. Palliative care on board, pt family would like full code now.   Vitals:   12/05/22 1800 12/05/22 1900 12/05/22 1935 12/05/22 2000  BP: 119/61 128/63  (!) 123/58  Pulse: 73 65  65  Resp: 18 17  15   Temp:    (!) 100.5 F (38.1 C)  TempSrc:    Axillary  SpO2: 94% 95% 95% 94%  Weight:      Height:       CBC:  Recent Labs  Lab 11/29/22 2147 11/29/22 2150 12/04/22 1323 12/05/22 0402  WBC 12.0*   < > 12.2* 10.3  NEUTROABS 4.8  --   --   --   HGB 13.9   < > 10.6* 10.2*  HCT 40.4   < > 32.0* 29.6*  MCV 92.9   < > 95.5 93.7  PLT 216   < > 76* 82*   < > = values in this interval not displayed.   Basic Metabolic Panel:  Recent Labs  Lab 12/03/22 0601 12/03/22 1056 12/04/22 0539 12/04/22 1323 12/05/22 0402 12/05/22 1108 12/05/22 1803  NA 155*   < > 149*   < > 149* 150* 149*  K 2.9*   < > 3.8  --  3.4*  --   --   CL 125*   < > 119*  --  116*  --   --   CO2 22   < > 23  --  26  --   --   GLUCOSE 147*   < > 166*  --  122*  --   --   BUN 28*   < > 27*  --  22  --   --   CREATININE 0.80   < > 0.85  --  0.77  --   --   CALCIUM 8.1*   < > 7.5*  --  8.0*  --   --   MG 1.8  --  2.2  --   --   --   --   PHOS 2.0*  --  2.3*  --   --   --   --    < > = values in this interval not displayed.   Lipid Panel:  Recent Labs  Lab 12/01/22 0645 12/03/22 0601  CHOL 178  --   TRIG 64 103  HDL 58  --   CHOLHDL 3.1  --   VLDL 13  --   LDLCALC 107*  --    Alcohol Level  Recent Labs  Lab 11/29/22 2147  ETH <10    IMAGING past 24 hours DG CHEST PORT 1 VIEW  Result Date: 12/05/2022 CLINICAL DATA:  Acute respiratory failure with hypoxemia EXAM: PORTABLE CHEST 1 VIEW COMPARISON:  Chest radiograph December 04, 2022 FINDINGS: Endotracheal tube with tip in the midthoracic trachea at the level of the aortic arch.  Enteric tube courses below the diaphragm looped with tip and side port projecting over the gastric fundus. Multiple cardiac leads coiled over the upper abdomen and chest. The heart size and mediastinal contours are unchanged. Stable bilateral pleural effusions. Increased size of the right perihilar infiltrate. No acute osseous abnormality. IMPRESSION: 1. Increased size of the right perihilar infiltrate. 2. Stable bilateral pleural effusions. Electronically Signed   By: December 06, 2022 M.D.   On: 12/05/2022 10:39  PHYSICAL EXAM General: Intubated elderly patient in no acute distress Respiratory: intubated on vent Neurological: intubated not on sedation, eyes open on voice, not following commands. With forced eye opening, eyes midline, not tracking, not blinking to visual threat on the right but blinking on the left, PERRL. Corneal reflex present, gag and cough present. Breathing over the vent.  Facial symmetry not able to test due to ET tube.  Tongue protrusion not cooperative. On pain stimulation, no movement on the RUE, slight withdraw at RLE, spontaneous movement of LUE and LLE. DTR 1+ and bilateral positive babinski. Sensation, coordination and gait not tested.   ASSESSMENT/PLAN [name] is a 81 y.o. female with history of hypertension and restless leg syndrome presenting with acute onset aphasia and right-sided weakness.  CT head on admission revealed very large left parietal ICH.  Neurosurgery was consulted, but it was not felt that neurosurgical intervention would be helpful to patient.  Prognosis is poor, and goals of care discussions with family are ongoing.  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  No hydrocephalus 2D Echo EF 60 to 65% LDL 107 HgbA1c pending VTE prophylaxis -SCDs No antithrombotic prior to admission, now on No antithrombotic secondary to ICH Therapy recommendations: Pending Disposition: palliative care on board but family wants full code for now  Cerebral edema Severe cytotoxic edema, trans falcine and downward brain herniation seen on CT Developing hydrocephalus seen on CT, neurosurgery declines to intervene as this will not change her prognosis Na 166->154->156-149-152-149 3% saline off -> NS @ 35 with TF Na Q6h CT repeat 12/25 stable hematoma, midline shift 1.2 cm no hydrocephalus. CT repeat pending  Hypertension Home meds: None Stable but on low end  Systolic blood pressure goal < 160 Long-term BP goal normotensive  Hyperlipidemia Home meds: None LDL 107, goal < 70 May consider statin at discharge  Respiratory failure Fever  Patient was intubated for airway protection Ventilator management per CCM Still on fentanyl Tmax 100.4->101.3->100.9 Leukocytosis WBC 12.3-> 12.2->10.3 On unasyn now UA WBC 6-10 CXR Bilateral effusions and right perihilar infiltrate.   Other Stroke Risk Factors Advanced Age >/= 31   Other Active Problems Dysphagia, OG tube on TF  Hospital day # 6  Marvel Plan, MD PhD Stroke Neurology 12/05/2022 10:33 PM  This patient is critically ill due to large ICH and IVH, cerebral edema, hyponatremia and at significant risk of neurological worsening, death form brain herniation, brain death, hydrocephalus, seizure. This patient's care requires constant monitoring of vital signs, hemodynamics, respiratory and cardiac monitoring, review of multiple databases, neurological assessment, discussion with family, other specialists and medical decision making  of high complexity. I spent 30 minutes of neurocritical care time in the care of this patient.    To contact Stroke Continuity provider, please refer to [email or web address]. After hours, contact General Neurology

## 2022-12-05 NOTE — Progress Notes (Addendum)
Patient began to have episodes of tachy cardia and tachypnea.  Bladder scanner was unable to find bladder.  RN had suspicion that patient was being affected by a full bladder post foley removal.  RN went ahead and did and I&O catheter.  Patient put out 375 mLs of urine.  Patient's returned to a more normal range post catheterization.

## 2022-12-05 NOTE — Progress Notes (Signed)
[facility] ADULT ICU REPLACEMENT PROTOCOL   The patient does apply for the [facility] Adult ICU Electrolyte Replacment Protocol based on the criteria listed below:   1.Exclusion criteria: TCTS, ECMO, Dialysis, and Myasthenia Gravis patients 2. Is GFR >/= 30 ml/min? Yes.    Patient's GFR today is >60 3. Is SCr </= 2? Yes.   Patient's SCr is 0.77 mg/dL 4. Did SCr increase >/= 0.5 in 24 hours? No. 5.Pt's weight >40kg  Yes.   6. Abnormal electrolyte(s): K+ 3.4  7. Electrolytes replaced per protocol 8.  Call MD STAT for K+ </= 2.5, Phos </= 1, or Mag </= 1 Physician:  [facility] 12/05/2022 5:02 AM

## 2022-12-05 NOTE — Consult Note (Signed)
Palliative Care Consult Note                                  Date: 12/05/2022   Patient Name: Pamela Johnston  DOB: February 04, 1941  MRN: [id number]  Age / Sex: 81 y.o., female  PCP: [name], MD Referring Physician: Stroke, [place], MD  Reason for Consultation: Establishing goals of care  HPI/Patient Profile: 81 y.o. 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  Upon admission she was intubated and treated.  Neurosurgery felt there is no helpful intervention offer.  Progress to team poor and palliative care was consulted for [place] conversations.  Past Medical History:  Diagnosis Date   Benign essential HTN 11/29/2022   Osteopenia 11/29/2022   RLS (restless legs syndrome) 11/29/2022   Sacroiliitis ([place]) 11/29/2022    Subjective:   This NP Walden Field reviewed medical records, received report from team, assessed the patient and then meet at the patient's bedside to discuss diagnosis, prognosis, GOC, EOL wishes disposition and options.  I met with today I met with the patient's husband [name], daughter Amedeo Plenty.  There is another son Eddie Dibbles who lives in [place].   Concept of Palliative Care was introduced as specialized medical care for people and their families living with serious illness.  If focuses on providing relief from the symptoms and stress of a serious illness.  The goal is to improve quality of life for both the patient and the family. Values and goals of care important to patient and family were attempted to be elicited.  Created space and opportunity for patient  and family to explore thoughts and feelings regarding current medical situation   Natural trajectory  and current clinical status were discussed. Questions and concerns addressed. Patient  encouraged to call with questions or concerns.    Patient/Family Understanding of Illness: They understand she has had a large intracerebral hemorrhage and there is questionable ability to return to functioning.  At best they have been told that she will be a full care/assist indefinitely.  They have gotten various prognosis anywhere from 3 days to 7 to 10 days.  We have substantial discussion about her clinical status.  The varying time frames provided and the rationale behind those.  Life Review: The patient is married to her husband for the past 32 to 66 years.  She has a daughter Ebony Hail and his son Eddie Dibbles.  She is very spiritual and faith based dividual.  Goals: To take it a day at a time, continued hope for miracle.  Today's Discussion: In addition to discussions described above we had extensive discussion on various topics.  We explained that there are certain milestones that were needed because for decisions will need to be made including trach (which was fully explained) as well as PEG for nutrition if she does not improve.  We reviewed based on imaging and assessments that it does not look like she has not had any substantial improvement.  However, the daughter noticed that when they came down on the fentanyl that she did try to open her eyes and seem to be aware of her surroundings but then due to agitation and fentanyl have to be increased again.  She is finding positivity in  this.  We discussed that they are very faith-based and they are confident they will receive a miracle.  He had somewhat of a discussion on miracles and that they are miraculous because they are rare.  We also discussed that sometimes when her prayers are answered that the answer is "no".  However, I supported them to continue to hope and pray.  They expressed appreciation of this.  The patient's husband noted that hearing that  should have a miracle she will likely not have a quality of life.  He is fearful that she would not have a quality of life and that long-term support would not bring her back.  Daughter noted that this is a discussion that family would need to have with each other to decide how to approach these thoughts.  We did pose a hypothetical Tyrone Schimke to the family that what is it that they would be looking for they would see that would indicate that they have either received a miracle or that they would not receive a miracle.  Daughter expressed that this would not be something that they would expect to happen within a certain number of days.  In general, timeframe not considered, she has an idea but I encouraged them to discuss this among some cells and to answer his questions today.  Expressed that palliative medicine will continue to support.  I provided contact information for our team.  I provided emotional and general support through therapeutic listening, empathy, sharing of stories, and other techniques. I answered all questions and addressed all concerns to the best of my ability.  Review of Systems  Unable to perform ROS: Acuity of condition    Objective:   Primary Diagnoses: Present on Admission:  ICH (intracerebral hemorrhage) ([place])   Physical Exam Vitals and nursing note reviewed.  Constitutional:      General: She is not in acute distress.    Appearance: She is ill-appearing.  HENT:     Head: Normocephalic and atraumatic.  Cardiovascular:     Rate and Rhythm: Normal rate.  Pulmonary:     Effort: Pulmonary effort is normal. No respiratory distress.  Abdominal:     General: Abdomen is flat.  Skin:    General: Skin is warm and dry.  Neurological:     Mental Status: She is unresponsive.     Vital Signs:  BP (!) 127/93   Pulse (!) 101   Temp 100 F (37.8 C) (Axillary)   Resp (!) 23   Ht _0  (1.549 m)   Wt 58.3 kg   SpO2 99%   BMI 24.29 kg/m   Palliative  Assessment/Data: 10%    Advanced Care Planning:   Existing Vynca/ACP Documentation: None  Primary Decision Maker: NEXT OF KIN  Code Status/Advance Care Planning: Full code  A discussion was had today regarding advanced directives. Concepts specific to code status, artifical feeding and hydration, continued IV antibiotics and rehospitalization was had.  The difference between a aggressive medical intervention path and a palliative comfort care path for this patient at this time was had.   Decisions/Changes to ACP: None today  Assessment & Plan:   Impression: 81 year old female with chronic comorbidities and acute presentation as described above.  We had an extensive discussion with family today.  They continue to remain hopeful and are confident that they will receive a miracle.  We discussed the medical and scientific aspects.  I supported their desire to continue to hope for a miracle.  However, we are  realistic with time frames when decisions would be needed about trach and PEG.  This seems to bring some reality to the patient's husband.  They mentioned that they will continue to hope and pray, they will have discussions amongst family to think about some of these deeper questions.  Overall long-term prognosis poor.  SUMMARY OF RECOMMENDATIONS   Continue full code Continue full scope of care Ongoing emotional and spiritual support of patient and family Continue goals of care discussions as clinical picture evolves PMT will continue to follow  Symptom Management:  Per primary team PMT is available to assist as needed  Prognosis:  Unable to determine  Discharge Planning:  To Be Determined   Discussed with: Patient's family, medical team, nursing team    Thank you for allowing [place] to participate in the care of MADIE CAHN PMT will continue to support holistically.  Time Total: 90 min  Greater than 50%  of this time was spent counseling and coordinating care related  to the above assessment and plan.  Signed by: Walden Field, NP Palliative Medicine Team  Team Phone # [phone] (Nights/Weekends)  12/05/2022, 4:38 PM

## 2022-12-06 ENCOUNTER — Inpatient Hospital Stay (HOSPITAL_COMMUNITY): Payer: Medicare PPO

## 2022-12-06 DIAGNOSIS — I619 Nontraumatic intracerebral hemorrhage, unspecified: Secondary | ICD-10-CM | POA: Diagnosis not present

## 2022-12-06 DIAGNOSIS — I611 Nontraumatic intracerebral hemorrhage in hemisphere, cortical: Secondary | ICD-10-CM | POA: Diagnosis not present

## 2022-12-06 DIAGNOSIS — G936 Cerebral edema: Secondary | ICD-10-CM | POA: Diagnosis not present

## 2022-12-06 DIAGNOSIS — T17908A Unspecified foreign body in respiratory tract, part unspecified causing other injury, initial encounter: Secondary | ICD-10-CM | POA: Diagnosis not present

## 2022-12-06 LAB — MAGNESIUM: Magnesium: 2.2 mg/dL (ref 1.7–2.4)

## 2022-12-06 LAB — GLUCOSE, CAPILLARY
Glucose-Capillary: 109 mg/dL — ABNORMAL HIGH (ref 70–99)
Glucose-Capillary: 123 mg/dL — ABNORMAL HIGH (ref 70–99)
Glucose-Capillary: 125 mg/dL — ABNORMAL HIGH (ref 70–99)
Glucose-Capillary: 112 mg/dL — ABNORMAL HIGH (ref 70–99)
Glucose-Capillary: 117 mg/dL — ABNORMAL HIGH (ref 70–99)
Glucose-Capillary: 117 mg/dL — ABNORMAL HIGH (ref 70–99)

## 2022-12-06 LAB — PHOSPHORUS: Phosphorus: 2.6 mg/dL (ref 2.5–4.6)

## 2022-12-06 LAB — BASIC METABOLIC PANEL
Anion gap: 8 (ref 5–15)
BUN: 22 mg/dL (ref 8–23)
CO2: 27 mmol/L (ref 22–32)
Calcium: 8.4 mg/dL — ABNORMAL LOW (ref 8.9–10.3)
Chloride: 112 mmol/L — ABNORMAL HIGH (ref 98–111)
Creatinine, Ser: 0.71 mg/dL (ref 0.44–1.00)
GFR, Estimated: >60 mL/min (ref >60)
Glucose, Bld: 113 mg/dL — ABNORMAL HIGH (ref 70–99)
Potassium: 2.7 mmol/L — CL (ref 3.5–5.1)
Sodium: 147 mmol/L — ABNORMAL HIGH (ref 135–145)

## 2022-12-06 LAB — SODIUM: Sodium: 148 mmol/L — ABNORMAL HIGH (ref 135–145)

## 2022-12-06 MED ORDER — POTASSIUM CHLORIDE 10 MEQ/100ML IV SOLN
10.0000 meq | INTRAVENOUS | Status: AC
  Administered 2022-12-06 (×4): 10 meq via INTRAVENOUS
  Filled 2022-12-06 (×4): qty 100

## 2022-12-06 MED ORDER — POTASSIUM CHLORIDE 20 MEQ PO PACK
40.0000 meq | PACK | Freq: Two times a day (BID) | ORAL | Status: AC
  Administered 2022-12-06 (×2): 40 meq
  Filled 2022-12-06 (×2): qty 2

## 2022-12-06 MED ORDER — POTASSIUM & SODIUM PHOSPHATES 280-160-250 MG PO PACK
2.0000 | PACK | Freq: Once | ORAL | Status: AC
  Administered 2022-12-06: 2
  Filled 2022-12-06: qty 2

## 2022-12-06 MED ORDER — MIDAZOLAM HCL 2 MG/2ML IJ SOLN
2.0000 mg | Freq: Once | INTRAMUSCULAR | Status: AC
  Administered 2022-12-06: 2 mg via INTRAVENOUS
  Filled 2022-12-06: qty 2

## 2022-12-06 MED ORDER — ROSUVASTATIN CALCIUM 20 MG PO TABS
20.0000 mg | ORAL_TABLET | Freq: Every day | ORAL | Status: DC
  Administered 2022-12-06 – 2023-02-27 (×83): 20 mg
  Filled 2022-12-06 (×83): qty 1

## 2022-12-06 MED ORDER — MIDAZOLAM HCL 2 MG/2ML IJ SOLN
1.0000 mg | INTRAMUSCULAR | Status: DC | PRN
  Administered 2022-12-06 – 2022-12-29 (×15): 1 mg via INTRAVENOUS
  Filled 2022-12-06 (×16): qty 2

## 2022-12-06 NOTE — Progress Notes (Signed)
Alerted MD [name] about tachycardia for the past hour (HR 120-130).  Was given orders to admit 1 MG versed and to enter versed prn order.

## 2022-12-06 NOTE — Progress Notes (Signed)
STROKE TEAM PROGRESS NOTE   INTERVAL HISTORY Husband is at the bedside. Pt still intubated, fever improved. Vital stable. However, still not open eyes on voice, not responsive, right hemiplegia. CT repeat this am stable hematoma with MLS 1.3-1.4 cm  Vitals:   12/06/22 0600 12/06/22 0700 12/06/22 0800 12/06/22 0845  BP: (!) 108/52 (!) 105/50 (!) 141/64 (!) 141/64  Pulse: 87 88 (!) 118 (!) 120  Resp: 16 15 19  (!) 24  Temp:   99.3 F (37.4 C)   TempSrc:   Axillary   SpO2: 93% 94% 94% 95%  Weight:      Height:       CBC:  Recent Labs  Lab 11/29/22 2147 11/29/22 2150 12/04/22 1323 12/05/22 0402  WBC 12.0*   < > 12.2* 10.3  NEUTROABS 4.8  --   --   --   HGB 13.9   < > 10.6* 10.2*  HCT 40.4   < > 32.0* 29.6*  MCV 92.9   < > 95.5 93.7  PLT 216   < > 76* 82*   < > = values in this interval not displayed.   Basic Metabolic Panel:  Recent Labs  Lab 12/04/22 0539 12/04/22 1323 12/05/22 0402 12/05/22 1108 12/05/22 2356 12/06/22 0604  NA 149*   < > 149*   < > 148* 147*  K 3.8  --  3.4*  --   --  2.7*  CL 119*  --  116*  --   --  112*  CO2 23  --  26  --   --  27  GLUCOSE 166*  --  122*  --   --  113*  BUN 27*  --  22  --   --  22  CREATININE 0.85  --  0.77  --   --  0.71  CALCIUM 7.5*  --  8.0*  --   --  8.4*  MG 2.2  --   --   --   --  2.2  PHOS 2.3*  --   --   --   --  2.6   < > = values in this interval not displayed.   Lipid Panel:  Recent Labs  Lab 12/01/22 0645 12/03/22 0601  CHOL 178  --   TRIG 64 103  HDL 58  --   CHOLHDL 3.1  --   VLDL 13  --   LDLCALC 107*  --    Alcohol Level  Recent Labs  Lab 11/29/22 2147  ETH <10    IMAGING past 24 hours CT HEAD WO CONTRAST (2148)  Result Date: 12/06/2022 CLINICAL DATA:  81 year old female code stroke presentation on 11/29/2022 with large left hemisphere hemorrhage or hemorrhagic infarct. Subsequent encounter. EXAM: CT HEAD WITHOUT CONTRAST TECHNIQUE: Contiguous axial images were obtained from the base of the  skull through the vertex without intravenous contrast. RADIATION DOSE REDUCTION: This exam was performed according to the departmental dose-optimization program which includes automated exposure control, adjustment of the mA and/or kV according to patient size and/or use of iterative reconstruction technique. COMPARISON:  12/03/2022 and earlier. FINDINGS: Brain: Large left hemisphere area of combined confluent edema and hemorrhage redemonstrated. Flame like hyperdense blood products encompass an area of roughly 73 by 62 x 71 mm (AP by transverse by CC) for an estimated intra-axial blood volume of 161 mL. Size and configuration not significantly changed from 12/25. Continued intraventricular extension of blood. A moderate volume of IVH primarily in the lateral ventricles and 4th  ventricle is stable. Trace superimposed subarachnoid hemorrhage primarily in the cerebellar folia is stable. Basilar cisterns remain patent otherwise. Associated intracranial mass effect with rightward midline shift of 13-14 mm is stable (series 3, image 21 and coronal image 31). Mass effect on the ventricular system with stable mildly trapped appearance of the right occipital and temporal horns. Mild if any transependymal edema. No new cerebral edema or infarction identified. Vascular: No suspicious intracranial vascular hyperdensity. Skull: Stable and intact.  Hyperostosis of the calvarium. Sinuses/Orbits: Patient remains intubated. Visualized paranasal sinuses and mastoids are stable and well aerated. Other: No acute orbit or scalp soft tissue finding. Partially visible endotracheal and oral enteric tube. IMPRESSION: 1. Stable since 12/03/2022: - Large left hemisphere hemorrhage (estimated 161 mL) with confluent regional edema. - small to moderate volume IVH.  Trace posterior fossa SAH. - intracranial mass effect with rightward midline shift of 13-14 mm, mild-to-moderate trapping of the right temporal and occipital horns. 2. No new edema  or other acute intracranial abnormality identified. Electronically Signed   By: Odessa Fleming M.D.   On: 12/06/2022 05:20    PHYSICAL EXAM General: Intubated elderly patient in no acute distress Respiratory: intubated on vent Neurological: intubated not on sedation, eyes closed, not following commands. With forced eye opening, eyes midline, not tracking, not blinking to visual threat on the right but mildly blinking on the left, PERRL. Corneal reflex present, gag and cough present. Breathing over the vent.  Facial symmetry not able to test due to ET tube.  Tongue protrusion not cooperative. On pain stimulation, no movement on the RUE, slight withdraw at RLE, spontaneous movement of LUE and mild withdraw of LLE. DTR 1+ and bilateral positive babinski. Sensation, coordination and gait not tested.   ASSESSMENT/PLAN [name] is a 81 y.o. female with history of hypertension and restless leg syndrome presenting with acute onset aphasia and right-sided weakness.  CT head on admission revealed very large left parietal ICH.  Neurosurgery was consulted, but it was not felt that neurosurgical intervention would be helpful to patient.  Prognosis is poor, and goals of care discussions with family are ongoing.  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  No hydrocephalus CT repeat 12/28 stable hematoma, midline shift 13-51mm. No hydro 2D Echo EF 60 to 65% LDL 107 HgbA1c pending VTE prophylaxis -SCDs No antithrombotic prior to admission, now on No  antithrombotic secondary to ICH Therapy recommendations: Pending Disposition: palliative care on board but family wants full code for now  Cerebral edema Severe cytotoxic edema, trans falcine and downward brain herniation seen on CT Developing hydrocephalus seen on CT, neurosurgery declines to intervene as this will not change her prognosis Na 166->154->156-149-152-149-147 3% saline off -> NS @ 35 with TF Will allow Na gradually trending down as already one week out of onset CT repeat 12/25 stable hematoma, midline shift 1.2 cm no hydrocephalus. CT repeat 12/28 stable hematoma, midline shift 13-71mm. No hydro  Hypertension Home meds: None Stable but on low end  Systolic blood pressure goal < 160 Long-term  BP goal normotensive  Hyperlipidemia Home meds: None LDL 107, goal < 70 May consider statin at discharge  Respiratory failure Fever  Leukocytosis Patient was intubated for airway protection Ventilator management per CCM Still on fentanyl Tmax 100.4->101.3->100.9->100.5 Leukocytosis WBC 12.3-> 12.2->10.3 On unasyn now UA WBC 6-10 CXR Bilateral effusions and right perihilar infiltrate.   Other Stroke Risk Factors Advanced Age >/= 15   Other Active Problems Dysphagia, OG tube on TF Hypokalemia - K 2.7, supplement  Hospital day # 7  Marvel Plan, MD PhD Stroke Neurology 12/06/2022 10:45 AM  This patient is critically ill due to large ICH and IVH, cerebral edema, hyponatremia and at significant risk of neurological worsening, death form brain herniation, brain death, hydrocephalus, seizure. This patient's care requires constant monitoring of vital signs, hemodynamics, respiratory and cardiac monitoring, review of multiple databases, neurological assessment, discussion with family, other specialists and medical decision making of high complexity. I spent 30 minutes of neurocritical care time in the care of this patient.  Discussed with husband.   To contact Stroke  Continuity provider, please refer to [email or web address]. After hours, contact General Neurology

## 2022-12-06 NOTE — Progress Notes (Signed)
NAME:  Pamela Johnston, MRN:  [id number], DOB:  [birth year], LOS: 7 ADMISSION DATE:  11/29/2022, CONSULTATION DATE:  12/21 REFERRING MD:  Amada Jupiter, REASON FOR CONSULT:   ventilator management in setting of large intraparenchymal hemorrage  Brief summary:  81 y/o female admitted on 12/21 with large L parietal, temporal acute intracerebral hemorrhage with extension in to basal ganglia and intrventricular hemorrhage with significant vasogenic edema and mass effect.  Intubated, given hypertonic saline.  NSGY felt no intervention would be helpful.  Prognosis poor, goals of care discussions ongoing.  Pertinent  Medical History  Hypertension Sacroilititis Restless legs syndrome osteopenia  Significant Hospital Events: Including procedures, antibiotic start and stop dates in addition to other pertinent events   12/21 - Presented to [facility] ED via EMS as Code Stroke. LKW 2000. Abrupt onset of decreased responsiveness, aphasia and R hemiplegia. CT Head with large L hemispheric parietal/temporal lobe IPH with extension into basal ganglia and IVH. HTS 3% started. Stroke admitting, NSGY consulted. PCCM consult for vent management. Treated with unasyn for aspiration pneumonia  12/24 palliative care consulted, family requested meeting to be delayed 12/25 Family requested goals of care meeting to be delayed 12/26 stopped unasyn, replete phos; Wanza Szumski GOC conversation: family says full code 12/27 restarted unasyn for fever, RLL infiltrate 12/28 CT head unchanged  Interim History / Subjective:   Restarted tube feeding  Objective   Blood pressure (!) 141/64, pulse (!) 118, temperature 99.7 F (37.6 C), temperature source Axillary, resp. rate 19, height 5\' 1"  (1.549 m), weight 60.1 kg, SpO2 94 %.    Vent Mode: PRVC FiO2 (%):  [30 %] 30 % Set Rate:  [16 bmp] 16 bmp Vt Set:  [330 mL] 330 mL PEEP:  [5 cmH20] 5 cmH20 Plateau Pressure:  [13 cmH20-18 cmH20] 13 cmH20   Intake/Output Summary (Last 24 hours) at  12/06/2022 0847 Last data filed at 12/06/2022 0800 Gross per 24 hour  Intake 995.33 ml  Output 2325 ml  Net -1329.67 ml   Filed Weights   11/29/22 2100 12/02/22 0458 12/06/22 0500  Weight: 57.8 kg 58.3 kg 60.1 kg    Examination:  General:  In bed on vent HENT: NCAT ETT in place PULM: CTA B, vent supported breathing CV: RRR, no mgr GI: BS+, soft, nontender MSK: normal bulk and tone Neuro: moving left arm/leg spontaneously, no eye opening, doesn't follow commands  12/26 CXR > RLL infiltrate, bilateral effusions  Resolved Hospital Problem list   Hypotension  Assessment & Plan:  Large L parietal, 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  Agree with recommendations for comfort measures.  Plan: Full mechanical vent support VAP prevention Daily WUA/SBT Unasyn to continue  Pulm toilette Secondary stroke prevention per neuro Replace K Hypertonic saline per neuro Ongoing GOC conversation   Best Practice (right click and "Reselect all SmartList Selections" daily)   Diet/type: tubefeeds DVT prophylaxis: SCD GI prophylaxis: H2B Lines: N/A Foley:  Yes, and it is no longer needed Code Status:  full code Last date of multidisciplinary goals of care discussion [updated husband bedside on 12/27.  He indicates that the family prefers to wait "a while" and that the family is resolute in their decision to not "pull the plug."  Support offered, let him know palliative care has been consulted.]  Critical care time: 32 minutes    [place] Alfred, MD [place] [facility] Pager:  [phone] Cell: [phone] After 7:00 pm call Elink  [phone]

## 2022-12-06 NOTE — Progress Notes (Signed)
SLP Cancellation Note  Patient Details Name: Pamela Johnston MRN: [id number] DOB: 13-Apr-1941   Cancelled treatment:       Reason Eval/Treat Not Completed: Patient not medically ready. Pt remains on vent. SLP to sign off - please reorder if needed.     Mahala Menghini., M.A. CCC-SLP Acute Rehabilitation Services Office [phone]  Secure chat preferred  12/06/2022, 7:47 AM

## 2022-12-06 NOTE — Progress Notes (Signed)
RT and RN X 1 transported patient to CT and back to 4N20 without event.  RT sx patient prior to and after transport per VAP protocol.

## 2022-12-06 NOTE — Progress Notes (Signed)
RN placed a foley catheter per Acute urinary retention post foley catheter removal.  MD [name] made aware.  Post foley placement patient became tachypnic (rr 40s) and tachycardic (HR 160s).  Rn spoke with MD [name] and was given orders to increase fentanyl gtt and administer one time dose of 2mg  IV versed.

## 2022-12-07 ENCOUNTER — Inpatient Hospital Stay (HOSPITAL_COMMUNITY): Payer: Medicare PPO

## 2022-12-07 DIAGNOSIS — I611 Nontraumatic intracerebral hemorrhage in hemisphere, cortical: Secondary | ICD-10-CM | POA: Diagnosis not present

## 2022-12-07 DIAGNOSIS — T17908A Unspecified foreign body in respiratory tract, part unspecified causing other injury, initial encounter: Secondary | ICD-10-CM | POA: Diagnosis not present

## 2022-12-07 LAB — CBC
HCT: 31.3 % — ABNORMAL LOW (ref 36.0–46.0)
Hemoglobin: 10 g/dL — ABNORMAL LOW (ref 12.0–15.0)
MCH: 31.2 pg (ref 26.0–34.0)
MCHC: 31.9 g/dL (ref 30.0–36.0)
MCV: 97.5 fL (ref 80.0–100.0)
Platelets: 114 10*3/uL — ABNORMAL LOW (ref 150–400)
RBC: 3.21 MIL/uL — ABNORMAL LOW (ref 3.87–5.11)
RDW: 13.6 % (ref 11.5–15.5)
WBC: 10.4 10*3/uL (ref 4.0–10.5)
nRBC: 0 % (ref 0.0–0.2)

## 2022-12-07 LAB — GLUCOSE, CAPILLARY
Glucose-Capillary: 105 mg/dL — ABNORMAL HIGH (ref 70–99)
Glucose-Capillary: 112 mg/dL — ABNORMAL HIGH (ref 70–99)
Glucose-Capillary: 114 mg/dL — ABNORMAL HIGH (ref 70–99)
Glucose-Capillary: 121 mg/dL — ABNORMAL HIGH (ref 70–99)
Glucose-Capillary: 130 mg/dL — ABNORMAL HIGH (ref 70–99)
Glucose-Capillary: 148 mg/dL — ABNORMAL HIGH (ref 70–99)

## 2022-12-07 LAB — BASIC METABOLIC PANEL
Anion gap: 5 (ref 5–15)
BUN: 34 mg/dL — ABNORMAL HIGH (ref 8–23)
CO2: 25 mmol/L (ref 22–32)
Calcium: 7.9 mg/dL — ABNORMAL LOW (ref 8.9–10.3)
Chloride: 119 mmol/L — ABNORMAL HIGH (ref 98–111)
Creatinine, Ser: 0.66 mg/dL (ref 0.44–1.00)
GFR, Estimated: >60 mL/min (ref >60)
Glucose, Bld: 102 mg/dL — ABNORMAL HIGH (ref 70–99)
Potassium: 3.8 mmol/L (ref 3.5–5.1)
Sodium: 149 mmol/L — ABNORMAL HIGH (ref 135–145)

## 2022-12-07 LAB — CULTURE, RESPIRATORY W GRAM STAIN: Gram Stain: NONE SEEN

## 2022-12-07 LAB — HEMOGLOBIN A1C
Hgb A1c MFr Bld: 5.5 % (ref 4.8–5.6)
Mean Plasma Glucose: 111 mg/dL

## 2022-12-07 MED ORDER — SULFAMETHOXAZOLE-TRIMETHOPRIM 200-40 MG/5ML PO SUSP
40.0000 mL | Freq: Two times a day (BID) | ORAL | Status: AC
  Administered 2022-12-07 – 2022-12-16 (×20): 40 mL
  Filled 2022-12-07 (×22): qty 40

## 2022-12-07 MED ORDER — FUROSEMIDE 10 MG/ML IJ SOLN
20.0000 mg | Freq: Once | INTRAMUSCULAR | Status: AC
  Administered 2022-12-07: 20 mg via INTRAVENOUS
  Filled 2022-12-07: qty 2

## 2022-12-07 NOTE — Procedures (Deleted)
Central Venous Catheter Insertion Procedure Note  Pamela Johnston  [id number]  08/13/1941  Date:12/07/22  Time:12:32 PM   Provider Performing:[name]   Procedure: Insertion of Non-tunneled Central Venous Catheter(36556) without US guidance  Indication(s) Medication administration and Difficult access  Consent Unable to obtain consent due to emergent nature of procedure.  Anesthesia Topical only with 1% lidocaine   Timeout Verified patient identification, verified procedure, site/side was marked, verified correct patient position, special equipment/implants available, medications/allergies/relevant history reviewed, required imaging and test results available.  Sterile Technique Maximal sterile technique including full sterile barrier drape, hand hygiene, sterile gown, sterile gloves, mask, hair covering, sterile ultrasound probe cover (if used).  Procedure Description Area of catheter insertion was cleaned with chlorhexidine and draped in sterile fashion.  Without real-time ultrasound guidance a central venous catheter was placed into the right subclavian vein. Nonpulsatile blood flow and easy flushing noted in all ports.  The catheter was sutured in place and sterile dressing applied.  Complications/Tolerance None; patient tolerated the procedure well. Chest X-ray is ordered to verify placement for internal jugular or subclavian cannulation.   Chest x-ray is not ordered for femoral cannulation.  EBL Minimal  Specimen(s) None    .jh

## 2022-12-07 NOTE — Progress Notes (Signed)
STROKE TEAM PROGRESS NOTE   INTERVAL HISTORY Husband is at the bedside. Pt still intubated, intermittent low-grade fever. Vital stable.  Neurologically unchanged.  Vitals:   12/07/22 1505 12/07/22 1600 12/07/22 1700 12/07/22 1800  BP:  (!) 126/57 (!) 120/53 (!) 111/48  Pulse: 84 81 88 77  Resp: 18 15 (!) 22 12  Temp:  99.3 F (37.4 C)    TempSrc:  Axillary    SpO2:  94% 95% 94%  Weight:      Height:       CBC:  Recent Labs  Lab 12/05/22 0402 12/07/22 0145  WBC 10.3 10.4  HGB 10.2* 10.0*  HCT 29.6* 31.3*  MCV 93.7 97.5  PLT 82* 114*   Basic Metabolic Panel:  Recent Labs  Lab 12/04/22 0539 12/04/22 1323 12/06/22 0604 12/07/22 0145  NA 149*   < > 147* 149*  K 3.8   < > 2.7* 3.8  CL 119*   < > 112* 119*  CO2 23   < > 27 25  GLUCOSE 166*   < > 113* 102*  BUN 27*   < > 22 34*  CREATININE 0.85   < > 0.71 0.66  CALCIUM 7.5*   < > 8.4* 7.9*  MG 2.2  --  2.2  --   PHOS 2.3*  --  2.6  --    < > = values in this interval not displayed.   Lipid Panel:  Recent Labs  Lab 12/01/22 0645 12/03/22 0601  CHOL 178  --   TRIG 64 103  HDL 58  --   CHOLHDL 3.1  --   VLDL 13  --   LDLCALC 107*  --    Alcohol Level  No results for input(s): "ETH" in the last 168 hours.   IMAGING past 24 hours DG Chest Port 1 View  Result Date: 12/07/2022 CLINICAL DATA:  Encounter for central line placement EXAM: PORTABLE CHEST 1 VIEW COMPARISON:  Portable exam 1242 hours compared to 12/05/2022 FINDINGS: Tip of endotracheal tube projects 4.7 cm above carina. Nasogastric tube extends into stomach. No central line identified. Numerous EKG leads project over chest. Stable upper normal heart size and normal mediastinal contours. Bibasilar infiltrates greater on LEFT. No pleural effusion or pneumothorax. Bones demineralized. IMPRESSION: Persistent bibasilar pulmonary infiltrates greater on LEFT. No central line identified. Electronically Signed   By: Ulyses Southward M.D.   On: 12/07/2022 13:05     PHYSICAL EXAM General: Intubated elderly patient in no acute distress Respiratory: intubated on vent Neurological: intubated on fentanyl, eyes closed, not following commands. With forced eye opening, eyes midline, not tracking, not blinking to visual threat on the right but mildly blinking on the left, PERRL. Corneal reflex present, gag and cough present. Breathing over the vent.  Facial symmetry not able to test due to ET tube.  Tongue protrusion not cooperative. On pain stimulation, no movement on the RUE, slight withdraw at RLE, spontaneous movement of LUE and mild withdraw of LLE. DTR 1+ and bilateral positive babinski. Sensation, coordination and gait not tested.   ASSESSMENT/PLAN Pamela Johnston is a 81 y.o. female with history of hypertension and restless leg syndrome presenting with acute onset aphasia and right-sided weakness.  CT head on admission revealed very large left parietal ICH.  Neurosurgery was consulted, but it was not felt that neurosurgical intervention would be helpful to patient.  Prognosis is poor, and goals of care discussions with family are ongoing.  ICH score 3  ICH:  Large left parietal ICH with volume 180 cubic cc with severe cytotoxic edema, transfalcine and downward brain herniation and hydrocephalus Etiology: Unclear Code Stroke CT head large posterior left parietal ICH with IVH and vasogenic edema and mass effect, 1.5 cm left to right midline shift CTA head & neck no LVO or other emergent finding, no vascular abnormality underlying ICH CT repeat 12/23 stable hematoma, midline shift 12 mm CT repeat 12/25 stable hematoma, midline shift 1.2 cm.  No hydrocephalus CT repeat 12/28 stable hematoma, midline shift 13-33mm. No hydro 2D Echo EF 60 to 65% LDL 107 HgbA1c 5.5 VTE prophylaxis -SCDs No antithrombotic prior to admission, now on No antithrombotic secondary to ICH Therapy recommendations: Pending Disposition: palliative care on board but family wants full  code for now  Cerebral edema Severe cytotoxic edema, trans falcine and downward brain herniation seen on CT Developing hydrocephalus seen on CT, neurosurgery declines to intervene as this will not change her prognosis Na 166->154->156-149-152-149-147-149 3% saline off -> NS @ 35->off On TF Will allow Na gradually trending down as already one week out of onset CT repeat 12/25 stable hematoma, midline shift 1.2 cm no hydrocephalus. CT repeat 12/28 stable hematoma, midline shift 13-13mm. No hydro  Hypertension Home meds: None Stable but on low end  Systolic blood pressure goal < 160 Long-term BP goal normotensive  Hyperlipidemia Home meds: None LDL 107, goal < 70 May consider statin at discharge  Respiratory failure Fever  Leukocytosis Patient was intubated for airway protection Ventilator management per CCM Still on fentanyl Tmax 100.4->101.3->100.9->100.5->100.9 Leukocytosis WBC 12.3-> 12.2->10.3->10.4 Off unasyn now UA WBC 6-10 CXR Bilateral effusions and right perihilar infiltrate.   Other Stroke Risk Factors Advanced Age >/= 28   Other Active Problems Dysphagia, OG tube on TF Hypokalemia - K 2.7->3.8   Hospital day # 8  Marvel Plan, MD PhD Stroke Neurology 12/07/2022 7:43 PM  This patient is critically ill due to large ICH and IVH, cerebral edema, hyponatremia and at significant risk of neurological worsening, death form brain herniation, brain death, hydrocephalus, seizure. This patient's care requires constant monitoring of vital signs, hemodynamics, respiratory and cardiac monitoring, review of multiple databases, neurological assessment, discussion with family, other specialists and medical decision making of high complexity. I spent 30 minutes of neurocritical care time in the care of this patient.  Discussed with husband.   To contact Stroke Continuity provider, please refer to [email or web address]. After hours, contact General Neurology

## 2022-12-07 NOTE — Progress Notes (Signed)
NAME:  Pamela Johnston, MRN:  [id number], DOB:  11/10/1941, LOS: 8 ADMISSION DATE:  11/29/2022, CONSULTATION DATE:  12/21 REFERRING MD:  Pamela Johnston, REASON FOR CONSULT:   ventilator management in setting of large intraparenchymal hemorrage  Brief summary:  81 y/o female admitted on 12/21 with large L parietal, temporal acute intracerebral hemorrhage with extension in to basal ganglia and intrventricular hemorrhage with significant vasogenic edema and mass effect.  Intubated, given hypertonic saline.  NSGY felt no intervention would be helpful.  Prognosis poor, goals of care discussions ongoing.  Pertinent  Medical History  Hypertension Sacroilititis Restless legs syndrome osteopenia  Significant Hospital Events: Including procedures, antibiotic start and stop dates in addition to other pertinent events   12/21 - Presented to [facility] ED via EMS as Code Stroke. LKW 2000. Abrupt onset of decreased responsiveness, aphasia and R hemiplegia. CT Head with large L hemispheric parietal/temporal lobe IPH with extension into basal ganglia and IVH. HTS 3% started. Stroke admitting, NSGY consulted. PCCM consult for vent management. Treated with unasyn for aspiration pneumonia  12/24 palliative care consulted, family requested meeting to be delayed 12/25 Family requested goals of care meeting to be delayed 12/26 stopped unasyn, replete phos; Pamela Johnston GOC conversation: family says full code 12/27 restarted unasyn for fever, RLL infiltrate 12/28 CT head unchanged  Interim History / Subjective:   Some agitation yesterday around foley placement Moving left side, nothing purposeful  Objective   Blood pressure (!) 119/51, pulse 89, temperature 98.5 F (36.9 C), temperature source Oral, resp. rate 20, height 5\' 1"  (1.549 m), weight 60 kg, SpO2 96 %.    Vent Mode: PRVC FiO2 (%):  [30 %] 30 % Set Rate:  [16 bmp] 16 bmp Vt Set:  [330 mL] 330 mL PEEP:  [5 cmH20] 5 cmH20 Plateau Pressure:  [12 cmH20-13 cmH20]  12 cmH20   Intake/Output Summary (Last 24 hours) at 12/07/2022 0756 Last data filed at 12/07/2022 0700 Gross per 24 hour  Intake 2465.21 ml  Output 1650 ml  Net 815.21 ml   Filed Weights   12/02/22 0458 12/06/22 0500 12/07/22 0500  Weight: 58.3 kg 60.1 kg 60 kg    Examination:  General:  In bed on vent HENT: NCAT ETT in place PULM: CTA B, vent supported breathing CV: RRR, no mgr GI: BS+, soft, nontender MSK: normal bulk and tone Neuro: sedated on vent   12/27 CXR > RLL infiltrate, bilateral effusions  Resolved Hospital Problem list   Hypotension  Assessment & Plan:  Large L parietal, temporal acute intracerebral hemorrhage with extension in to basal ganglia and intrventricular hemorrhage with significant vasogenic edema and mass effect Brain compression Acute hypoxemic respiratory failure in setting of acute intraparenchymal hemorrhage Concern for aspiration pneumonia Restless legs syndrome Chronic sacroiliitis Osteopenia Anasarca  Discussion: Devastatingly large intraparenchymal brain hemorrhage with no meaningful neurologic improvement.  Agree with recommendations for comfort measures.  Plan: Ongoing GOC conversations per primary/palliative Full mechanical vent support VAP prevention Daily WUA/SBT Unasyn: plan 10 days Pulm toilette Secondary stroke prevention per neuro Lasix x 1 dose Fentanyl infusion for vent synchrony/comfort Prn midazolam Antihypertensives per primary  Best Practice (right click and "Reselect all SmartList Selections" daily)   Diet/type: tubefeeds DVT prophylaxis: SCD GI prophylaxis: H2B Lines: N/A Foley:  Yes, and it is no longer needed Code Status:  full code Last date of multidisciplinary goals of care discussion [Husband updated 12/28]  Critical care time: 31 minutes    1/28, MD [place] [facility] Pager: [phone] Cell: (  [phone] After 7:00 pm call Elink  [phone]

## 2022-12-08 DIAGNOSIS — I619 Nontraumatic intracerebral hemorrhage, unspecified: Secondary | ICD-10-CM | POA: Diagnosis not present

## 2022-12-08 DIAGNOSIS — T17908A Unspecified foreign body in respiratory tract, part unspecified causing other injury, initial encounter: Secondary | ICD-10-CM | POA: Diagnosis not present

## 2022-12-08 DIAGNOSIS — I611 Nontraumatic intracerebral hemorrhage in hemisphere, cortical: Secondary | ICD-10-CM | POA: Diagnosis not present

## 2022-12-08 DIAGNOSIS — Z7189 Other specified counseling: Secondary | ICD-10-CM

## 2022-12-08 LAB — CBC
HCT: 31.8 % — ABNORMAL LOW (ref 36.0–46.0)
Hemoglobin: 10.7 g/dL — ABNORMAL LOW (ref 12.0–15.0)
MCH: 32.3 pg (ref 26.0–34.0)
MCHC: 33.6 g/dL (ref 30.0–36.0)
MCV: 96.1 fL (ref 80.0–100.0)
Platelets: 156 10*3/uL (ref 150–400)
RBC: 3.31 MIL/uL — ABNORMAL LOW (ref 3.87–5.11)
RDW: 13.6 % (ref 11.5–15.5)
WBC: 11.8 10*3/uL — ABNORMAL HIGH (ref 4.0–10.5)
nRBC: 0 % (ref 0.0–0.2)

## 2022-12-08 LAB — BASIC METABOLIC PANEL
Anion gap: 8 (ref 5–15)
BUN: 32 mg/dL — ABNORMAL HIGH (ref 8–23)
CO2: 24 mmol/L (ref 22–32)
Calcium: 8 mg/dL — ABNORMAL LOW (ref 8.9–10.3)
Chloride: 115 mmol/L — ABNORMAL HIGH (ref 98–111)
Creatinine, Ser: 0.7 mg/dL (ref 0.44–1.00)
GFR, Estimated: >60 mL/min (ref >60)
Glucose, Bld: 147 mg/dL — ABNORMAL HIGH (ref 70–99)
Potassium: 3.1 mmol/L — ABNORMAL LOW (ref 3.5–5.1)
Sodium: 147 mmol/L — ABNORMAL HIGH (ref 135–145)

## 2022-12-08 LAB — GLUCOSE, CAPILLARY
Glucose-Capillary: 146 mg/dL — ABNORMAL HIGH (ref 70–99)
Glucose-Capillary: 128 mg/dL — ABNORMAL HIGH (ref 70–99)
Glucose-Capillary: 120 mg/dL — ABNORMAL HIGH (ref 70–99)
Glucose-Capillary: 107 mg/dL — ABNORMAL HIGH (ref 70–99)
Glucose-Capillary: 137 mg/dL — ABNORMAL HIGH (ref 70–99)
Glucose-Capillary: 152 mg/dL — ABNORMAL HIGH (ref 70–99)

## 2022-12-08 LAB — PHOSPHORUS: Phosphorus: 2.9 mg/dL (ref 2.5–4.6)

## 2022-12-08 LAB — MAGNESIUM: Magnesium: 2.2 mg/dL (ref 1.7–2.4)

## 2022-12-08 MED ORDER — POTASSIUM CHLORIDE 20 MEQ PO PACK
20.0000 meq | PACK | ORAL | Status: AC
  Administered 2022-12-08 (×2): 20 meq
  Filled 2022-12-08 (×2): qty 1

## 2022-12-08 MED ORDER — POTASSIUM CHLORIDE 10 MEQ/100ML IV SOLN
10.0000 meq | INTRAVENOUS | Status: AC
  Administered 2022-12-08 (×4): 10 meq via INTRAVENOUS
  Filled 2022-12-08: qty 100

## 2022-12-08 NOTE — Progress Notes (Signed)
NAME:  Pamela Johnston, MRN:  [id number], DOB:  1941/05/14, LOS: 9 ADMISSION DATE:  11/29/2022, CONSULTATION DATE:  12/21 REFERRING MD:  Amada Jupiter, REASON FOR CONSULT:   ventilator management in setting of large intraparenchymal hemorrage  Brief summary:  81 y/o female admitted on 12/21 with large L parietal, temporal acute intracerebral hemorrhage with extension in to basal ganglia and intrventricular hemorrhage with significant vasogenic edema and mass effect.  Intubated, given hypertonic saline.  NSGY felt no intervention would be helpful.  Prognosis poor, goals of care discussions ongoing.  Pertinent  Medical History  Hypertension Sacroilititis Restless legs syndrome osteopenia  Significant Hospital Events: Including procedures, antibiotic start and stop dates in addition to other pertinent events   12/21 - Presented to [facility] ED via EMS as Code Stroke. LKW 2000. Abrupt onset of decreased responsiveness, aphasia and R hemiplegia. CT Head with large L hemispheric parietal/temporal lobe IPH with extension into basal ganglia and IVH. HTS 3% started. Stroke admitting, NSGY consulted. PCCM consult for vent management. Treated with unasyn for aspiration pneumonia  12/24 palliative care consulted, family requested meeting to be delayed 12/25 Family requested goals of care meeting to be delayed 12/26 stopped unasyn, replete phos; Brennan Litzinger GOC conversation: family says full code 12/27 restarted unasyn for fever, RLL infiltrate; resp culture: enterobacter, MDR 12/28 CT head unchanged 12/29 unasyn changed to bactrim  Interim History / Subjective:   12/27 culture: MDR enterobacter, changed from unasyn to bactrim Lasix x1 > diuresed well  Objective   Blood pressure (!) 117/56, pulse 78, temperature 99 F (37.2 C), temperature source Oral, resp. rate 19, height 5\' 1"  (1.549 m), weight 60 kg, SpO2 95 %.    Vent Mode: PRVC FiO2 (%):  [30 %] 30 % Set Rate:  [16 bmp] 16 bmp Vt Set:  [330 mL] 330  mL PEEP:  [5 cmH20] 5 cmH20 Pressure Support:  [10 cmH20] 10 cmH20 Plateau Pressure:  [15 cmH20] 15 cmH20   Intake/Output Summary (Last 24 hours) at 12/08/2022 0737 Last data filed at 12/08/2022 0600 Gross per 24 hour  Intake 759.99 ml  Output 3025 ml  Net -2265.01 ml   Filed Weights   12/02/22 0458 12/06/22 0500 12/07/22 0500  Weight: 58.3 kg 60.1 kg 60 kg    Examination:  General:  In bed on vent HENT: NCAT ETT in place PULM: CTA B, vent supported breathing CV: RRR, no mgr GI: BS+, soft, nontender MSK: normal bulk and tone Neuro: doesn't follow commands, doesn't open eyes, reaches for ETT with left hand   12/27 CXR > RLL infiltrate, bilateral effusions  Resolved Hospital Problem list   Hypotension  Assessment & Plan:  Large L parietal, temporal acute intracerebral hemorrhage with 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  Anticipate severe long term disability without chance of independent function.   Plan: Ongoing GOC conversations per primary/palliative Full mechanical vent support VAP prevention Daily WUA/SBT Bactrim, plan 7 days Secondary stroke prevention per neuro Fentanyl infusion for comfort/vent synchrony Prn versed Goal SBP < 160  Best Practice (right click and "Reselect all SmartList Selections" daily)   Diet/type: tubefeeds DVT prophylaxis: SCD GI prophylaxis: H2B Lines: N/A Foley:  Yes, and it is no longer needed Code Status:  full code  Last date of multidisciplinary goals of care discussion [Husband updated 12/28]  Critical care time: 31 minutes     Roselie Awkward, MD [place] [facility] Pager: [phone] Cell: [phone] After 7:00 pm call Elink  [phone]

## 2022-12-08 NOTE — Progress Notes (Signed)
[facility] ADULT ICU REPLACEMENT PROTOCOL   The patient does apply for the [facility] Adult ICU Electrolyte Replacment Protocol based on the criteria listed below:   1.Exclusion criteria: TCTS, ECMO, Dialysis, and Myasthenia Gravis patients 2. Is GFR >/= 30 ml/min? Yes.    Patient's GFR today is >60 3. Is SCr </= 2? Yes.   Patient's SCr is 0.70 mg/dL 4. Did SCr increase >/= 0.5 in 24 hours? No. 5.Pt's weight >40kg  Yes.   6. Abnormal electrolyte(s): K+ 3.1 7. Electrolytes replaced per protocol 8.  Call MD STAT for K+ </= 2.5, Phos </= 1, or Mag </= 1 Physician:  Re[facility] Dam [facility] 12/08/2022 4:38 AM

## 2022-12-08 NOTE — Progress Notes (Addendum)
STROKE TEAM PROGRESS NOTE   INTERVAL HISTORY No family at bedside. ICU nurse at the bedside. Temp 100.9 overnight. Low potassium replaced.  Palliative continue to follow, appreciate their help.  Pt still intubated, intermittent low-grade fever. Vital stable.  Neurologically unchanged.  Vitals:   12/08/22 0800 12/08/22 0900 12/08/22 0930 12/08/22 1100  BP: (!) 116/59 (!) 129/50 (!) 121/57 (!) 130/55  Pulse: 78 90 87 79  Resp: 20 18 (!) 21 16  Temp:      TempSrc:      SpO2: 94% 95% 96% 93%  Weight:      Height:       CBC:  Recent Labs  Lab 12/07/22 0145 12/08/22 0331  WBC 10.4 11.8*  HGB 10.0* 10.7*  HCT 31.3* 31.8*  MCV 97.5 96.1  PLT 114* 156    Basic Metabolic Panel:  Recent Labs  Lab 12/04/22 0539 12/04/22 1323 12/06/22 0604 12/07/22 0145 12/08/22 0331  NA 149*   < > 147* 149* 147*  K 3.8   < > 2.7* 3.8 3.1*  CL 119*   < > 112* 119* 115*  CO2 23   < > 27 25 24   GLUCOSE 166*   < > 113* 102* 147*  BUN 27*   < > 22 34* 32*  CREATININE 0.85   < > 0.71 0.66 0.70  CALCIUM 7.5*   < > 8.4* 7.9* 8.0*  MG 2.2  --  2.2  --   --   PHOS 2.3*  --  2.6  --   --    < > = values in this interval not displayed.    Lipid Panel:  Recent Labs  Lab 12/03/22 0601  TRIG 103    Alcohol Level  No results for input(s): "ETH" in the last 168 hours.   IMAGING past 24 hours No results found.  PHYSICAL EXAM General: Intubated elderly patient in no acute distress.  Pamela Johnston is on 25 of fentanyl for sedation. Respiratory: intubated on vent Neurological: intubated, on fentanyl, eyes closed, not following commands. With forced eye opening, eyes midline, not tracking, not blinking to visual threat on the right but mildly blinking on the left, PERRL.  Breathing over the vent.  Facial symmetry not able to test due to ET tube. Tongue protrusion not able to test due to ET tube.   On pain stimulation, no movement on the RUE, flicker RLE, localization to pain in LUE and flicker of LLE.  Bilateral positive babinski.   Sensation, coordination and gait not tested.   ASSESSMENT/PLAN Pamela Johnston is a 81 y.o. female with history of hypertension and restless leg syndrome presenting with acute onset aphasia and right-sided weakness.  CT head on admission revealed very large left parietal ICH.  Neurosurgery was consulted, but it was not felt that neurosurgical intervention would be helpful to patient.  Prognosis is poor, and goals of care discussions with family are ongoing.  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  No hydrocephalus CT repeat 12/28 stable hematoma, midline shift 13-70mm. No hydro  2D Echo EF 60 to 65% LDL 107 HgbA1c 5.5 VTE prophylaxis -SCDs No antithrombotic prior to admission, now on No antithrombotic secondary to [place] Therapy recommendations: Pending Disposition: palliative care on board but family wants full code for now. Happy to assist with goals of care/family meetings as needed.   Cerebral edema Severe cytotoxic edema, trans falcine and downward brain herniation seen on CT Developing hydrocephalus seen on CT, neurosurgery declines to intervene as this will not change her prognosis Na 166->154->156-149-152-149-147-149 - 147 3% saline off -> NS @ 35->off On TF Will allow Na gradually trending down as already one week out of onset CT repeat 12/25 stable hematoma, midline shift 1.2 cm no hydrocephalus. CT repeat 12/28 stable hematoma, midline shift 13-30mm. No  hydrocephalus.  Hypertension Home meds: None Stable but on low end  Systolic blood pressure goal < 160 Long-term BP goal normotensive  Hyperlipidemia Home meds: None LDL 107, goal < 70 May consider statin at discharge  Respiratory failure Fever  Leukocytosis Patient was intubated for airway protection Ventilator management per CCM Still on fentanyl Tmax 100.4->101.3->100.9->100.5->100.9->99 Leukocytosis WBC 12.3-> 12.2->10.3->10.4->11.8 Off unasyn now UA WBC 6-10 CXR Bilateral effusions and right perihilar infiltrate.   Other Stroke Risk Factors Advanced Age >/= 46   Other Active Problems Dysphagia, OG tube on TF Hypokalemia - K 2.7->3.8 --> 3.1 Replaced, will recheck in AM  Hospital day # 9  Pt seen by Neuro NP/APP and later by MD. Note/plan to be edited by MD as needed.    Pamela Santee, DNP, AGACNP-BC [facility] Please use AMION for pager and EPIC for messaging  ATTENDING ATTESTATION:  81 year old with large left intraparenchymal hemorrhage intubated and sedated on fentanyl only.  CT has been stable with midline shift.  Prognosis appears to be poor.  Palliative care is following.  Will continue to trend WBCs at is slightly elevated.  Temperature overnight 100.9.  Pamela Johnston is on Bactrim.  Potassium replaced this morning.  Appreciate ICU assistance with management.  Discussed with nurse that I will be available for consultation family and prognosis if needed.  Dr. Reeves Forth evaluated pt independently, reviewed imaging, chart, labs. Discussed and formulated plan with the Resident/APP. Changes were made to the note where appropriate. Please see APP/resident note above for details.      This patient is critically ill due to respiratory distress, hemorrhagic stroke and at significant risk of neurological worsening, death form heart failure, respiratory failure, recurrent stroke, bleeding from [facility], seizure, sepsis. This patient's care requires constant monitoring of  vital signs, hemodynamics, respiratory and cardiac monitoring, review of multiple databases, neurological assessment, discussion with family, other specialists and medical decision making of high complexity. I spent 35 minutes of neurocritical care time in the care of this patient.   Pamela Guthridge,MD    To contact Stroke Continuity provider, please refer to [email or web address] After hours, contact General Neurology

## 2022-12-08 NOTE — Progress Notes (Signed)
Daily Progress Note   Patient Name: Pamela Johnston       Date: 12/08/2022 DOB: Dec 09, 1941  Age: 81 y.o. MRN#: [id number] Attending Physician: Stroke, Md, MD Primary Care Physician: Cecil Cobbs, MD Admit Date: 11/29/2022  Reason for Consultation/Follow-up: Establishing goals of care  Patient Profile/HPI: 81 y.o. 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  Upon admission she was intubated for airway support.  No recommendations for interventions per neurosurgery. Prognosis for meaningful neurologic recovery is poor and palliative care was consulted for GOC conversations.   Subjective: Chart reviewed including labs, progress notes, imaging from this and previous encounters.  Patient continues with poor neuro status. Does not open eyes to voice or touch.  Spouse at bedside.  He and family are having continued discussions regarding goals of care and consideration of comfort measures vs ongoing aggressive care. One daughter who has a husband who is an MD is planning to visit today.  I answered spouse's questions related to extubation process and comfort measures.   Review of Systems  Unable to perform ROS: Intubated     Physical Exam Vitals and nursing note reviewed.  Constitutional:      Appearance: She is ill-appearing.  Cardiovascular:     Rate and Rhythm: Normal rate.  Pulmonary:     Comments: intubated Skin:    General: Skin is warm and dry.     Coloration: Skin is pale.  Neurological:     Comments: unresponsive              Vital Signs: BP (!) 120/49   Pulse 78   Temp 99 F (37.2 C) (Oral)   Resp 17   Ht 5\' 1"  (1.549 m)   Wt 60 kg   SpO2 93%   BMI 24.99 kg/m  SpO2: SpO2: 93 % O2 Device: O2 Device: Ventilator O2 Flow Rate:    Intake/output summary:  Intake/Output Summary (Last 24 hours) at 12/08/2022 1333 Last data filed at 12/08/2022 1200 Gross per 24 hour  Intake 1318.84 ml  Output 1750 ml  Net -431.16 ml   LBM: Last BM Date : 12/08/22 Baseline Weight: Weight:  57.8 kg Most recent weight: Weight: 60 kg       Palliative Assessment/Data: PPS: 10%      Patient Active Problem List   Diagnosis Date Noted   Aspiration into airway 11/30/2022   Hypoxia 11/30/2022   Intraparenchymal hemorrhage of brain (HCC) 11/30/2022   ICH (intracerebral hemorrhage) (HCC) 11/29/2022   RLS (restless legs syndrome) 11/29/2022   Osteopenia 11/29/2022   Sacroiliitis (HCC) 11/29/2022   Benign essential HTN 11/29/2022    Palliative Care Assessment & Plan    Assessment/Recommendations/Plan  Continue Full code, full scope measures Family continuing to discuss GOC among themselves PMT will continue to follow   Code Status: Full code  Prognosis:  Unable to determine  Discharge Planning: To Be Determined  Care plan was discussed with patient's spouse.   Thank you for allowing the Palliative Medicine Team to assist in the care of this patient.   Greater than 50%  of this time was spent counseling and coordinating care related to the above assessment and plan.  Ocie Bob, AGNP-C Palliative Medicine   Please contact Palliative Medicine Team phone at [phone] for questions and concerns.

## 2022-12-09 ENCOUNTER — Inpatient Hospital Stay (HOSPITAL_COMMUNITY): Payer: Medicare PPO

## 2022-12-09 DIAGNOSIS — I611 Nontraumatic intracerebral hemorrhage in hemisphere, cortical: Secondary | ICD-10-CM | POA: Diagnosis not present

## 2022-12-09 LAB — CBC
HCT: 32.4 % — ABNORMAL LOW (ref 36.0–46.0)
Hemoglobin: 10.4 g/dL — ABNORMAL LOW (ref 12.0–15.0)
MCH: 31.1 pg (ref 26.0–34.0)
MCHC: 32.1 g/dL (ref 30.0–36.0)
MCV: 97 fL (ref 80.0–100.0)
Platelets: 185 10*3/uL (ref 150–400)
RBC: 3.34 MIL/uL — ABNORMAL LOW (ref 3.87–5.11)
RDW: 13.7 % (ref 11.5–15.5)
WBC: 13 10*3/uL — ABNORMAL HIGH (ref 4.0–10.5)
nRBC: 0 % (ref 0.0–0.2)

## 2022-12-09 LAB — BASIC METABOLIC PANEL
Anion gap: 11 (ref 5–15)
BUN: 38 mg/dL — ABNORMAL HIGH (ref 8–23)
CO2: 23 mmol/L (ref 22–32)
Calcium: 8.2 mg/dL — ABNORMAL LOW (ref 8.9–10.3)
Chloride: 112 mmol/L — ABNORMAL HIGH (ref 98–111)
Creatinine, Ser: 0.87 mg/dL (ref 0.44–1.00)
GFR, Estimated: >60 mL/min (ref >60)
Glucose, Bld: 127 mg/dL — ABNORMAL HIGH (ref 70–99)
Potassium: 3.9 mmol/L (ref 3.5–5.1)
Sodium: 146 mmol/L — ABNORMAL HIGH (ref 135–145)

## 2022-12-09 LAB — GLUCOSE, CAPILLARY
Glucose-Capillary: 123 mg/dL — ABNORMAL HIGH (ref 70–99)
Glucose-Capillary: 153 mg/dL — ABNORMAL HIGH (ref 70–99)
Glucose-Capillary: 134 mg/dL — ABNORMAL HIGH (ref 70–99)
Glucose-Capillary: 132 mg/dL — ABNORMAL HIGH (ref 70–99)
Glucose-Capillary: 135 mg/dL — ABNORMAL HIGH (ref 70–99)
Glucose-Capillary: 147 mg/dL — ABNORMAL HIGH (ref 70–99)

## 2022-12-09 MED ORDER — LACTATED RINGERS IV BOLUS
1000.0000 mL | Freq: Once | INTRAVENOUS | Status: AC
  Administered 2022-12-09: 1000 mL via INTRAVENOUS

## 2022-12-09 NOTE — Progress Notes (Addendum)
Spoke to the patient's son-in-law via telephone and updated him on the patient's new scan that is worse.  It appears that she has a new small bleed in the right parietal lobe.  There is progressive intracranial mass effect right midline shift and subfalcine herniation up to 2 cm trapping the right lateral ventricle.  This is ordered after her exam this morning.  Be worse with right-sided continuous posturing in bilateral toes.  Discussed her poor prognosis especially in light of this worsening scan.  I answered all his questions as well and the patient's daughter was with him as well.  They will regroup and discuss her prognosis and plan and get back to [place].  In the afternoon, they are able to come into the ICU and we spoke in person.  Neurosurgery was also made aware of the new CT scan.  No intervention planned.  Family will still continue to deliberate the next steps.

## 2022-12-09 NOTE — Progress Notes (Addendum)
STROKE TEAM PROGRESS NOTE   INTERVAL HISTORY Husband at bedside and updated.  She is unresponsive and not following commands.  Off sedation this morning but appears to be breathing heavily and require fentanyl to be restarted Patient having abnormal R sided posturing today. Fentanyl drip off since 0740 this a.m, nurse at bedside.  Vitals:   12/09/22 0737 12/09/22 0800 12/09/22 0900 12/09/22 1000  BP: (!) 131/56 (!) 123/58 (!) 155/74 (!) 142/60  Pulse: 81 90 (!) 118 (!) 119  Resp: 20 17 (!) 26 (!) 30  Temp:  99.9 F (37.7 C)    TempSrc:  Oral    SpO2: 94% 94% 94% 97%  Weight:      Height:       CBC:  Recent Labs  Lab 12/08/22 0331 12/09/22 0251  WBC 11.8* 13.0*  HGB 10.7* 10.4*  HCT 31.8* 32.4*  MCV 96.1 97.0  PLT 156 185   Basic Metabolic Panel:  Recent Labs  Lab 12/06/22 0604 12/07/22 0145 12/08/22 0331 12/08/22 0830 12/09/22 0251  NA 147*   < > 147*  --  146*  K 2.7*   < > 3.1*  --  3.9  CL 112*   < > 115*  --  112*  CO2 27   < > 24  --  23  GLUCOSE 113*   < > 147*  --  127*  BUN 22   < > 32*  --  38*  CREATININE 0.71   < > 0.70  --  0.87  CALCIUM 8.4*   < > 8.0*  --  8.2*  MG 2.2  --   --  2.2  --   PHOS 2.6  --   --  2.9  --    < > = values in this interval not displayed.   Lipid Panel:  Recent Labs  Lab 12/03/22 0601  TRIG 103   Alcohol Level  No results for input(s): "ETH" in the last 168 hours.   IMAGING past 24 hours CT HEAD WO CONTRAST ( )  Addendum Date: 12/09/2022   ADDENDUM REPORT: 12/09/2022 11:03 ADDENDUM: Study discussed by telephone with Neurology Dr. Viviann Spare on 12/09/2022 at 1047 hours. Electronically Signed   By: Odessa Fleming M.D.   On: 12/09/2022 11:03   Result Date: 12/09/2022 CLINICAL DATA:  81 year old female code stroke presentation on 11/29/2022 with large left hemisphere hemorrhage or hemorrhagic infarct. Subsequent encounter.  Head CT 12/06/2022 and earlier. EXAM: CT HEAD WITHOUT CONTRAST TECHNIQUE: Contiguous axial images were  obtained from the base of the skull through the vertex without intravenous contrast. RADIATION DOSE REDUCTION: This exam was performed according to the departmental dose-optimization program which includes automated exposure control, adjustment of the mA and/or kV according to patient size and/or use of iterative reconstruction technique. COMPARISON:  None Available. FINDINGS: Brain: Extensive hemorrhage and edema in the left hemisphere persist. Overall size and configuration of amorphous intra-axial hemorrhage is not significantly changed when comparing sagittal images. Ongoing confluent surrounding edema. However, subsequent intracranial mass effect and rightward midline shift have progressed since 12/06/2022. Subfalcine herniation and rightward midline shift now up to 20 mm (previously 14 mm). And there is a small new area of amorphous intra-axial hemorrhage in the right parietal lobe measuring 18 by 22 mm on series 3, image 21. Cytotoxic edema suspected also in that region. This is posterior to the tentorial inside sure in does not appear related to sub fall seen herniation. Intraventricular hemorrhage volume does not appear significantly changed, but  trapping of the right lateral ventricle appears increased with larger right temporal horn now. Subtotally effaced left lateral and 3rd ventricles. Partial effacement of the suprasellar cistern is new (sagittal image 28) however, other basilar cisterns remain patent and the 4th ventricle remains normal. Trace cerebellar folia subarachnoid blood is less apparent. No brainstem hemorrhage. Maintained brainstem and cerebellar gray-white differentiation. Vascular: Calcified atherosclerosis at the skull base. Skull: Intact. Hyperostosis of the calvarium. No acute osseous abnormality identified. Sinuses/Orbits: Patient remains intubated but Visualized paranasal sinuses and mastoids are stable and well aerated. Other: No acute orbit or scalp soft tissue finding. IMPRESSION:  1. Progressive intracranial mass effect from large left hemisphere hemorrhage and edema. Rightward midline shift/subfalcine herniation now up to 2 cm. Increased trapping of the right lateral ventricle. New partial effacement of the suprasellar cistern. 2. And new smaller (2.2 cm) focus of hemorrhage and edema in the right parietal lobe since 12/06/2022. 3. Intraventricular hemorrhage volume not appear significantly changed. Trace posterior fossa SAH appears regressed. Electronically Signed: By: Genevie Ann M.D. On: 12/09/2022 10:17   DG CHEST PORT 1 VIEW  Result Date: 12/09/2022 CLINICAL DATA:  81 year old female with severe intracranial hemorrhage. Leukocytosis. EXAM: PORTABLE CHEST 1 VIEW COMPARISON:  Portable chest 12/07/2022 and earlier. FINDINGS: Portable AP semi upright view at 0921 hours. Endotracheal tube tip in good position between the clavicles and carina. Enteric tube loops in the epigastrium, stable. Larger lung volumes. Decreased but not resolved bibasilar veiling opacity. Normal cardiac size and mediastinal contours. No pneumothorax or pulmonary edema. No air bronchograms. Paucity of bowel gas in the visible upper abdomen. No acute osseous abnormality identified. IMPRESSION: 1. Stable lines and tubes. 2. Larger lung volumes with decreased but not resolved bibasilar opacity, favor atelectasis. No new cardiopulmonary abnormality. Electronically Signed   By: Genevie Ann M.D.   On: 12/09/2022 10:20    PHYSICAL EXAM General: non sedated patient who appears in acute distress. Cardiovascular:  Tachycardic, regular. Respiratory: Remains intubated on ventilator. Neurological: No spontaneous eye opening.  Intubated off sedation this morning.  With forced eye opening PERRL, VOR intact.  Does not track.   Motor- with painful stimuli patient localizes in LUE/ flicker LLE.  RUE with continuous extensor posturing without stimuli.  Bilateral upgoing toes.  Sensation, coordination and gait not tested.    ASSESSMENT/PLAN Ms. BROOKES STALOCH is a 81 y.o. female with history of hypertension and restless leg syndrome presenting with acute onset aphasia and right-sided weakness.  CT head on admission revealed very large left parietal ICH.  Neurosurgery was consulted, but it was not felt that neurosurgical intervention would be helpful to patient.  Prognosis is poor, and goals of care discussions with family are ongoing.  They are currently awaiting physician son-in-law to return to town for further discussions.   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  Also with new small focus of hemorrhage and edema R parietal lobe.  MD reviewed with patient's family at bedside. CTA head & neck no LVO or other emergent finding, no vascular abnormality underlying ICH CT repeat 12/23 stable hematoma, midline shift 12 mm  CT repeat 12/25 stable hematoma, midline shift 1.2 cm.  No hydrocephalus CT repeat 12/28 stable hematoma, midline shift 13-73mm. No hydro 2D Echo EF 60 to 65% LDL 107 HgbA1c 5.5 VTE prophylaxis -SCDs No antithrombotic prior to admission, now on No antithrombotic secondary to [place] Therapy recommendations: Pending Disposition: palliative care on board but family wants full code for now.  Full team meeting may be beneficial when family ready.  Per husband, not ready today.  Cerebral edema Severe cytotoxic edema, trans falcine and downward brain herniation seen on CT; worse on CT today. Developing hydrocephalus seen on CT, neurosurgery declines to intervene as this will not change her prognosis Na  166->154->156-149-152-149-147-149 - 147 3% saline off -> NS @ 35->off On TF Will allow Na gradually trending down as already one week out of onset CT repeat 12/25 stable hematoma, midline shift 1.2 cm no hydrocephalus. CT repeat 12/28 stable hematoma, midline shift 13-52mm. No hydrocephalus.  Hypertension Home meds: None Stable but on low end  Systolic blood pressure goal < 160 Long-term BP goal normotensive  Hyperlipidemia Home meds: None LDL 107, goal < 70 May consider statin at discharge  Respiratory failure Fever  Leukocytosis Patient was intubated for airway protection Ventilator management per CCM CXR repeated today due to leukocytosis- atelectasis per exam b/l. Fentanyl on hold but RN to restart given current tachycardia. Tmax 100.4->101.3->100.9->100.5->100.9->99 Leukocytosis WBC 12.3-> 12.2->10.3->10.4->11.8 Off unasyn now UA WBC 6-10 CXR 12/07/22 with bibasilar pulm infiltrates/ L greater  Other Stroke Risk Factors Advanced Age >/= 63   Other Active Problems Dysphagia, OG tube on TF Hypokalemia - K 2.7->3.8 --> 3.1--> 3.9 Replaced yesterday and normal today.  Hospital day # [place], NP   ATTENDING ATTESTATION: 81 year old with large left intraparenchymal hemorrhage intubated and sedated on fentanyl only.  This morning found to be posturing in the right upper extremity continues without stimuli.  Bilateral upgoing toes.  She is still unresponsive to commands but will localize with left upper extremity on sternal rub.  Pupils equal round and sluggish.  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  The findings were discussed with radiology.  This points to a poor prognosis and prognosis was discussed with patient's son-in-law.  They will discuss further and get back to the  team.  Appreciate ICU assistance with management.     Dr. Reeves Forth evaluated pt independently, reviewed imaging, chart, labs. Discussed and formulated plan with the Resident/APP. Changes were made to the note where appropriate. Please see APP/resident note above for details.      This patient is critically ill due to respiratory distress, hemorrhagic stroke and at significant risk of neurological worsening, death form heart failure, respiratory failure, recurrent stroke, bleeding from [facility], seizure, sepsis. This patient's care requires constant monitoring of vital signs, hemodynamics, respiratory and cardiac monitoring, review of multiple databases, neurological assessment, discussion with family, other specialists and medical decision making of high complexity. I spent 45 minutes of neurocritical care time in the care of this patient.     Brihana Quickel,MD   To contact Stroke Continuity provider, please refer to [email or web address] After hours, contact General Neurology

## 2022-12-09 NOTE — Progress Notes (Signed)
NAME:  Pamela Johnston, MRN:  [id number], DOB:  [birth year], LOS: 12 ADMISSION DATE:  11/29/2022, CONSULTATION DATE:  12/21 REFERRING MD:  Leonel Ramsay, REASON FOR CONSULT:   ventilator management in setting of large intraparenchymal hemorrage  Brief summary:  81 y/o female admitted on 12/21 with large L parietal, temporal acute intracerebral hemorrhage with extension in to basal ganglia and intrventricular hemorrhage with significant vasogenic edema and mass effect.  Intubated, given hypertonic saline.  NSGY felt no intervention would be helpful.  Prognosis poor, goals of care discussions ongoing.  Pertinent  Medical History  Hypertension Sacroilititis Restless legs syndrome osteopenia  Significant Hospital Events: Including procedures, antibiotic start and stop dates in addition to other pertinent events   12/21 - Presented to [facility] ED via EMS as Code Stroke. LKW 2000. Abrupt onset of decreased responsiveness, aphasia and R hemiplegia. CT Head with large L hemispheric parietal/temporal lobe IPH with extension into basal ganglia and IVH. HTS 3% started. Stroke admitting, NSGY consulted. PCCM consult for vent management. Treated with unasyn for aspiration pneumonia  12/24 palliative care consulted, family requested meeting to be delayed 12/25 Family requested goals of care meeting to be delayed 12/26 stopped unasyn, replete phos; [place] conversation: family says full code 12/27 restarted unasyn for fever, RLL infiltrate; resp culture: enterobacter, MDR 12/28 CT head unchanged 12/29 unasyn changed to bactrim 12/31 posturing, CT head with increased shift, increased bleeding  Interim History / Subjective:   More posturing, CT head worse, remains mechanically ventilated  Objective   Blood pressure (!) 123/58, pulse 90, temperature 99.9 F (37.7 C), temperature source Oral, resp. rate 17, height 5\' 1"  (1.549 m), weight 60 kg, SpO2 94 %.    Vent Mode: PRVC FiO2 (%):  [30 %] 30 % Set Rate:   [16 bmp] 16 bmp Vt Set:  [330 mL] 330 mL PEEP:  [5 cmH20] 5 cmH20 Pressure Support:  [10 cmH20] 10 cmH20   Intake/Output Summary (Last 24 hours) at 12/09/2022 0935 Last data filed at 12/09/2022 0800 Gross per 24 hour  Intake 974.41 ml  Output 1700 ml  Net -725.59 ml   Filed Weights   12/02/22 0458 12/06/22 0500 12/07/22 0500  Weight: 58.3 kg 60.1 kg 60 kg    Examination:  General:  In bed on vent HENT: NCAT ETT in place PULM: CTA B, vent supported breathing CV: RRR, no mgr GI: BS+, soft, nontender MSK: normal bulk and tone Neuro: sedated on vent  12/27 CXR > RLL infiltrate, bilateral effusions  Resolved Hospital Problem list   Hypotension  Assessment & Plan:  Large L parietal, temporal acute intracerebral hemorrhage with extension in to basal ganglia and intrventricular hemorrhage with significant vasogenic edema and mass effect Brain compression > worse 12/31 Acute hypoxemic respiratory failure in setting of acute intraparenchymal hemorrhage Concern for aspiration pneumonia Restless legs syndrome Chronic sacroiliitis Osteopenia Anasarca Enterobacter pneumonia  Discussion: 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  Anticipate severe long term disability without chance of independent function.  12/31 overall condition worsening. Recommending comfort measures.  Plan: Ongoing GOC conversations per primary/palliative Full mechanical vent support VAP prevention Daily WUA/SBT Bactrim 7 days Secondary stroke prevention per neuro Fentanyl for comfort/vent synchrony Prn versed SBP goal < 160   Best Practice (right click and "Reselect all SmartList Selections" daily)   Diet/type: tubefeeds DVT prophylaxis: SCD GI prophylaxis: H2B Lines: N/A Foley:  Yes, and it is no longer needed Code Status:  full code Last date of multidisciplinary goals  of care discussion [Husband updated 12/31]  Critical care time: 31 minutes     Heber [place], MD [place] P[facility] Pager: [phone] Cell: [phone] After 7:00 pm call Elink  [phone]

## 2022-12-09 NOTE — Progress Notes (Signed)
Pt transported to and from 4N20 to CT2 on vent. Pt stable throughout with no complications. VS WNL.  

## 2022-12-10 DIAGNOSIS — I612 Nontraumatic intracerebral hemorrhage in hemisphere, unspecified: Secondary | ICD-10-CM | POA: Diagnosis not present

## 2022-12-10 DIAGNOSIS — Z7189 Other specified counseling: Secondary | ICD-10-CM | POA: Diagnosis not present

## 2022-12-10 DIAGNOSIS — I619 Nontraumatic intracerebral hemorrhage, unspecified: Secondary | ICD-10-CM | POA: Diagnosis not present

## 2022-12-10 DIAGNOSIS — I611 Nontraumatic intracerebral hemorrhage in hemisphere, cortical: Secondary | ICD-10-CM | POA: Diagnosis not present

## 2022-12-10 DIAGNOSIS — J9601 Acute respiratory failure with hypoxia: Secondary | ICD-10-CM | POA: Diagnosis not present

## 2022-12-10 LAB — BASIC METABOLIC PANEL WITH GFR
Anion gap: 13 (ref 5–15)
BUN: 39 mg/dL — ABNORMAL HIGH (ref 8–23)
CO2: 21 mmol/L — ABNORMAL LOW (ref 22–32)
Calcium: 7.9 mg/dL — ABNORMAL LOW (ref 8.9–10.3)
Chloride: 111 mmol/L (ref 98–111)
Creatinine, Ser: 0.82 mg/dL (ref 0.44–1.00)
GFR, Estimated: >60 mL/min
Glucose, Bld: 135 mg/dL — ABNORMAL HIGH (ref 70–99)
Potassium: 3.8 mmol/L (ref 3.5–5.1)
Sodium: 145 mmol/L (ref 135–145)

## 2022-12-10 LAB — CBC
HCT: 30.4 % — ABNORMAL LOW (ref 36.0–46.0)
Hemoglobin: 9.9 g/dL — ABNORMAL LOW (ref 12.0–15.0)
MCH: 31.7 pg (ref 26.0–34.0)
MCHC: 32.6 g/dL (ref 30.0–36.0)
MCV: 97.4 fL (ref 80.0–100.0)
Platelets: 216 10*3/uL (ref 150–400)
RBC: 3.12 MIL/uL — ABNORMAL LOW (ref 3.87–5.11)
RDW: 13.6 % (ref 11.5–15.5)
WBC: 12.1 10*3/uL — ABNORMAL HIGH (ref 4.0–10.5)
nRBC: 0 % (ref 0.0–0.2)

## 2022-12-10 LAB — GLUCOSE, CAPILLARY
Glucose-Capillary: 129 mg/dL — ABNORMAL HIGH (ref 70–99)
Glucose-Capillary: 121 mg/dL — ABNORMAL HIGH (ref 70–99)
Glucose-Capillary: 113 mg/dL — ABNORMAL HIGH (ref 70–99)
Glucose-Capillary: 118 mg/dL — ABNORMAL HIGH (ref 70–99)
Glucose-Capillary: 129 mg/dL — ABNORMAL HIGH (ref 70–99)
Glucose-Capillary: 141 mg/dL — ABNORMAL HIGH (ref 70–99)

## 2022-12-10 NOTE — Progress Notes (Signed)
Daily Progress Note   Patient Name: Pamela Johnston       Date: 12/10/2022 DOB: 01/19/1941  Age: 82 y.o. MRN#: [id number] Attending Physician: Stroke, Md, MD Primary Care Physician: [name], MD Admit Date: 11/29/2022  Reason for Consultation/Follow-up: Establishing goals of care  Patient Profile/HPI: 82 y.o. 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  Upon admission she was intubated for airway support.  No recommendations for interventions per neurosurgery. Prognosis for meaningful neurologic recovery is poor and palliative care was consulted for [place] conversations.   Subjective: Chart reviewed including labs, progress notes, imaging from this and previous encounters.  Patient's spouse at bedside.  Answered his questions regarding patient's current status. He continues to wish for aggressive measures.  I offered to have family meeting this week and he is agreement.   Review of Systems  Unable to perform ROS: Intubated     Physical Exam Vitals and nursing note reviewed.  Constitutional:      Appearance: She is ill-appearing.  Cardiovascular:     Rate and Rhythm: Normal rate.  Pulmonary:     Comments: intubated Skin:    General: Skin is warm and dry.     Coloration: Skin is pale.  Neurological:     Comments: unresponsive             Vital Signs: BP (!) 121/45   Pulse 86   Temp (!) 100.8 F (38.2 C) (Axillary)   Resp 20   Ht 5\' 1"  (1.549 m)   Wt 60 kg   SpO2 94%   BMI 24.99 kg/m  SpO2: SpO2: 94 % O2 Device:  O2 Device: Ventilator O2 Flow Rate:    Intake/output summary:  Intake/Output Summary (Last 24 hours) at 12/10/2022 1253 Last data filed at 12/10/2022 1200 Gross per 24 hour  Intake 1811.52 ml  Output 2425 ml  Net -613.48 ml    LBM: Last BM Date : 12/10/22 Baseline Weight: Weight: 57.8 kg Most recent weight: Weight: 60 kg       Palliative Assessment/Data: PPS: 10%      Patient Active Problem List   Diagnosis Date Noted  Aspiration into airway 11/30/2022   Hypoxia 11/30/2022   Intraparenchymal hemorrhage of brain ([place]) 11/30/2022   ICH (intracerebral hemorrhage) ([place]) 11/29/2022   RLS (restless legs syndrome) 11/29/2022   Osteopenia 11/29/2022   Sacroiliitis ([place]) 11/29/2022   Benign essential HTN 11/29/2022    Palliative Care Assessment & Plan    Assessment/Recommendations/Plan  Continue Full code, full scope measures PMT will arrange f/u family meeting this week   Code Status: Full code  Prognosis:  Unable to determine  Discharge Planning: To Be Determined  Care plan was discussed with patient's spouse and care team.   Thank you for allowing the Palliative Medicine Team to assist in the care of this patient.   Greater than 50%  of this time was spent counseling and coordinating care related to the above assessment and plan.  Mariana Kaufman, AGNP-C Palliative Medicine   Please contact Palliative Medicine Team phone at [phone] for questions and concerns.

## 2022-12-10 NOTE — Progress Notes (Signed)
Patient's family requests that patient's son-in-law be contacted with updates on patient's status  Pamela Johnston (son-in-law)   [phone]  Montez Hageman RN

## 2022-12-10 NOTE — Progress Notes (Signed)
NAME:  Pamela Johnston, MRN:  [id number], DOB:  May 29, 1941, LOS: 60 ADMISSION DATE:  11/29/2022, CONSULTATION DATE:  12/21 REFERRING MD:  Leonel Ramsay, REASON FOR CONSULT:   ventilator management in setting of large intraparenchymal hemorrage  Brief summary:  82 y/o female admitted on 12/21 with large L parietal, temporal acute intracerebral hemorrhage with extension in to basal ganglia and intrventricular hemorrhage with significant vasogenic edema and mass effect.  Intubated, given hypertonic saline.  NSGY felt no intervention would be helpful.  Prognosis poor, goals of care discussions ongoing.  Pertinent  Medical History  Hypertension Sacroilititis Restless legs syndrome osteopenia  Significant Hospital Events: Including procedures, antibiotic start and stop dates in addition to other pertinent events   12/21 - Presented to [facility] ED via EMS as Code Stroke. LKW 2000. Abrupt onset of decreased responsiveness, aphasia and R hemiplegia. CT Head with large L hemispheric parietal/temporal lobe IPH with extension into basal ganglia and IVH. HTS 3% started. Stroke admitting, NSGY consulted. PCCM consult for vent management. Treated with unasyn for aspiration pneumonia  12/24 palliative care consulted, family requested meeting to be delayed 12/25 Family requested goals of care meeting to be delayed 12/26 stopped unasyn, replete phos; Henrique Parekh [place] conversation: family says full code 12/27 restarted unasyn for fever, RLL infiltrate; resp culture: enterobacter, MDR 12/28 CT head unchanged 12/29 unasyn changed to bactrim 12/31 posturing, CT head with increased shift, increased bleeding  Interim History / Subjective:   Worsening findings on CT head yesterday Multiple discussions with family  Objective   Blood pressure (!) 115/50, pulse 85, temperature (!) 100.5 F (38.1 C), temperature source Axillary, resp. rate 20, height 5\' 1"  (1.549 m), weight 60 kg, SpO2 96 %.    Vent Mode: PRVC FiO2 (%):   [40 %] 40 % Set Rate:  [16 bmp] 16 bmp Vt Set:  [330 mL-380 mL] 380 mL PEEP:  [5 cmH20] 5 cmH20 Plateau Pressure:  [9 cmH20-13 cmH20] 12 cmH20   Intake/Output Summary (Last 24 hours) at 12/10/2022 0801 Last data filed at 12/10/2022 0745 Gross per 24 hour  Intake 1630.08 ml  Output 2100 ml  Net -469.92 ml   Filed Weights   12/02/22 0458 12/06/22 0500 12/07/22 0500  Weight: 58.3 kg 60.1 kg 60 kg    Examination:  General:  In bed on vent HENT: NCAT ETT in place PULM: CTA B, vent supported breathing CV: RRR, no mgr GI: BS+, soft, nontender MSK: normal bulk and tone Neuro: sedated on vent   12/27 CXR > RLL infiltrate, bilateral effusions  Resolved Hospital Problem list   Hypotension  Assessment & Plan:  Large L parietal, temporal acute intracerebral hemorrhage with extension in to basal ganglia and intrventricular hemorrhage with significant vasogenic edema and mass effect Brain compression > worse 12/31 Acute hypoxemic respiratory failure in setting of acute intraparenchymal hemorrhage Concern for aspiration pneumonia Restless legs syndrome Chronic sacroiliitis Osteopenia Anasarca Enterobacter pneumonia  Discussion: 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  Anticipate severe long term disability without chance of independent function.  12/31 overall condition worsening, worsening bleeding. Recommending comfort measures.  Plan: Ongoing GOC conversations per primary/palliative Full mechanical ventilator support VAP prevention No plans for SBT given severe neurologic injuyr Bactrim 7 days Secondary stroke prevention per neuro Fentanyl for comfort/vent synchrony Prn versed SBP goal < 160   Best Practice (right click and "Reselect all SmartList Selections" daily)   Diet/type: tubefeeds DVT prophylaxis: SCD GI prophylaxis: H2B Lines: N/A Foley:  Yes, and it  is no longer needed Code Status:  full code Last date of  multidisciplinary goals of care discussion [Husband updated 1/1]  Critical care time: 30 minutes    Roselie Awkward, MD [place] P[facility]M Pager: [phone] Cell: [phone] After 7:00 pm call Elink  [phone]

## 2022-12-10 NOTE — Progress Notes (Addendum)
STROKE TEAM PROGRESS NOTE   INTERVAL HISTORY Husband at bedside and updated.  She is unresponsive and not following commands.   No localizing to pain in LUE (present prior) maybe due to fentanyl.  Repeat CT 12/31 showed increased midline shift and worsened herniation.  Results discussed with family. Available as needed for family meetings.    Vitals:   12/10/22 0925 12/10/22 1000 12/10/22 1100 12/10/22 1200  BP: (!) 116/51 (!) 121/50 (!) 114/47 (!) 121/45  Pulse:  86 90 86  Resp:  18 20 20   Temp:      TempSrc:      SpO2:  93% 93% 94%  Weight:      Height:       CBC:  Recent Labs  Lab 12/09/22 0251 12/10/22 0314  WBC 13.0* 12.1*  HGB 10.4* 9.9*  HCT 32.4* 30.4*  MCV 97.0 97.4  PLT 185 672    Basic Metabolic Panel:  Recent Labs  Lab 12/06/22 0604 12/07/22 0145 12/08/22 0830 12/09/22 0251 12/10/22 0314  NA 147*   < >  --  146* 145  K 2.7*   < >  --  3.9 3.8  CL 112*   < >  --  112* 111  CO2 27   < >  --  23 21*  GLUCOSE 113*   < >  --  127* 135*  BUN 22   < >  --  38* 39*  CREATININE 0.71   < >  --  0.87 0.82  CALCIUM 8.4*   < >  --  8.2* 7.9*  MG 2.2  --  2.2  --   --   PHOS 2.6  --  2.9  --   --    < > = values in this interval not displayed.    Lipid Panel:  No results for input(s): "CHOL", "TRIG", "HDL", "CHOLHDL", "VLDL", "LDLCALC" in the last 168 hours.  Alcohol Level  No results for input(s): "ETH" in the last 168 hours.   IMAGING past 24 hours No results found.  PHYSICAL EXAM General: non sedated patient who appears in acute distress. Cardiovascular:  Tachycardic, regular. Respiratory: Remains intubated on ventilator on CPAP. Neurological:  Sedation paused for exam (Fentanyl 24mcg) No spontaneous eye opening. With forced eye opening, sluggish pupil response. Does not track. Positive corneal/cough/gag.   Motor- with painful stimuli patient flickers LUE/LLE.  RUE with extensor posturing .  Bilateral upgoing toes, stronger in right.    Sensation, coordination and gait not tested.   ASSESSMENT/PLAN Pamela Johnston is a 82 y.o. female with history of hypertension and restless leg syndrome presenting with acute onset aphasia and right-sided weakness.  CT head on admission revealed very large left parietal ICH.  Neurosurgery was consulted, but it was not felt that neurosurgical intervention would be helpful to patient.  Prognosis is poor, and goals of care discussions with family are ongoing.  They are currently awaiting physician son-in-law to return to town for further discussions.   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  Also with new small focus of hemorrhage and  edema R parietal lobe.  MD reviewed with patient's family at bedside. CTA head & neck no LVO or other emergent finding, no vascular abnormality underlying ICH CT repeat 12/23 stable hematoma, midline shift 12 mm CT repeat 12/25 stable hematoma, midline shift 1.2 cm.  No hydrocephalus CT repeat 12/28 stable hematoma, midline shift 13-66mm. No hydro 2D Echo EF 60 to 65% LDL 107 HgbA1c 5.5 VTE prophylaxis -SCDs No antithrombotic prior to admission, now on No antithrombotic secondary to [place] Therapy recommendations: Pending Disposition: palliative care on board but family wants full code for now.    Cerebral edema Severe cytotoxic edema, trans falcine and downward brain herniation seen on CT; worse on repeat CT 12/31. Developing hydrocephalus seen on CT, neurosurgery declines to intervene as this will not change her prognosis Na 166->154->156-149-152-149-147-149 -  147->145 3% saline off -> NS @ 35->off On TF Will allow Na gradually trending down as already one week out of onset CT repeat 12/25 stable hematoma, midline shift 1.2 cm no hydrocephalus. CT repeat 12/28 stable hematoma, midline shift 13-47mm. No hydrocephalus. Ct repeat 12/31: Left to Right midline shift/subfalcine herniation increased to 2cm. Increased trapping of right ventricle. New small hemorrhage R parietal.  Dr. Reeves Forth reviewed results with family 12/31 afternoon  Hypertension Home meds: None Stable but on low end  Systolic blood pressure goal < 160 Long-term BP goal normotensive  Hyperlipidemia Home meds: None LDL 107, goal < 70 May consider statin at discharge  Respiratory failure Fever  Leukocytosis Patient was intubated for airway protection Ventilator management per CCM CXR repeated today due to leukocytosis- atelectasis per exam b/l. Fentanyl on hold but RN to restart given current tachycardia. Tmax 100.4->101.3->100.9->100.5->100.9->99-?100.8 Leukocytosis WBC 12.3-> 12.2->10.3->10.4->11.8-->12.1 Off unasyn now UA WBC 6-10 CXR 12/07/22 with bibasilar pulm infiltrates/ L greater  Other Stroke Risk Factors Advanced Age >/= 52   Other Active Problems Dysphagia, OG tube on TF Hypokalemia - K 2.7->3.8 --> 3.1--> 3.9-->3.8 Monitor and recheck in AM.   Hospital day # 11  ATTENDING ATTESTATION:  82 year old with large left intraparenchymal hemorrhage on repeat CT yesterday had new right parietal hemorrhage worsening edema and subfalcine herniation 2 cm.  Today exam is essentially unchanged.  She is not localizing with the left upper extremity as she was yesterday.  Pupils appear to be equal round and sluggish.  No asymmetry.  No eye deviation.  Bilateral corneals are intact.  Continues to posture on the right.  Bilateral toes upgoing.  Family still in discussion continue current measures and full code.  Dr. Reeves Forth evaluated pt independently, reviewed imaging,  chart, labs. Discussed and formulated plan with the Resident/APP. Changes were made to the note where appropriate. Please see APP/resident note above for details.    This patient is critically ill due to respiratory distress, hemorrhagic stroke and at significant risk of neurological worsening, death form heart failure, respiratory failure, recurrent stroke, bleeding from [facility], seizure, sepsis. This patient's care requires constant monitoring of vital signs, hemodynamics, respiratory and cardiac monitoring, review of multiple databases, neurological assessment, discussion with family, other specialists and medical decision making of high complexity. I spent 35 minutes of neurocritical care time in the care of this patient.    Pamela Licht,MD    To contact Stroke Continuity provider, please refer to [email or web address] After hours, contact General Neurology

## 2022-12-11 DIAGNOSIS — Z515 Encounter for palliative care: Secondary | ICD-10-CM | POA: Diagnosis not present

## 2022-12-11 DIAGNOSIS — G935 Compression of brain: Secondary | ICD-10-CM

## 2022-12-11 DIAGNOSIS — T17908A Unspecified foreign body in respiratory tract, part unspecified causing other injury, initial encounter: Secondary | ICD-10-CM | POA: Diagnosis not present

## 2022-12-11 DIAGNOSIS — I611 Nontraumatic intracerebral hemorrhage in hemisphere, cortical: Secondary | ICD-10-CM | POA: Diagnosis not present

## 2022-12-11 DIAGNOSIS — J9601 Acute respiratory failure with hypoxia: Secondary | ICD-10-CM

## 2022-12-11 DIAGNOSIS — I619 Nontraumatic intracerebral hemorrhage, unspecified: Secondary | ICD-10-CM | POA: Diagnosis not present

## 2022-12-11 DIAGNOSIS — Z7189 Other specified counseling: Secondary | ICD-10-CM | POA: Diagnosis not present

## 2022-12-11 LAB — CBC
HCT: 34.8 % — ABNORMAL LOW (ref 36.0–46.0)
Hemoglobin: 11.8 g/dL — ABNORMAL LOW (ref 12.0–15.0)
MCH: 32.2 pg (ref 26.0–34.0)
MCHC: 33.9 g/dL (ref 30.0–36.0)
MCV: 94.8 fL (ref 80.0–100.0)
Platelets: 293 10*3/uL (ref 150–400)
RBC: 3.67 MIL/uL — ABNORMAL LOW (ref 3.87–5.11)
RDW: 13.2 % (ref 11.5–15.5)
WBC: 12.4 10*3/uL — ABNORMAL HIGH (ref 4.0–10.5)
nRBC: 0 % (ref 0.0–0.2)

## 2022-12-11 LAB — BASIC METABOLIC PANEL
Anion gap: 12 (ref 5–15)
BUN: 36 mg/dL — ABNORMAL HIGH (ref 8–23)
CO2: 22 mmol/L (ref 22–32)
Calcium: 8.4 mg/dL — ABNORMAL LOW (ref 8.9–10.3)
Chloride: 110 mmol/L (ref 98–111)
Creatinine, Ser: 0.69 mg/dL (ref 0.44–1.00)
GFR, Estimated: >60 mL/min (ref >60)
Glucose, Bld: 123 mg/dL — ABNORMAL HIGH (ref 70–99)
Potassium: 4.2 mmol/L (ref 3.5–5.1)
Sodium: 144 mmol/L (ref 135–145)

## 2022-12-11 LAB — GLUCOSE, CAPILLARY
Glucose-Capillary: 147 mg/dL — ABNORMAL HIGH (ref 70–99)
Glucose-Capillary: 132 mg/dL — ABNORMAL HIGH (ref 70–99)
Glucose-Capillary: 132 mg/dL — ABNORMAL HIGH (ref 70–99)
Glucose-Capillary: 130 mg/dL — ABNORMAL HIGH (ref 70–99)
Glucose-Capillary: 132 mg/dL — ABNORMAL HIGH (ref 70–99)
Glucose-Capillary: 130 mg/dL — ABNORMAL HIGH (ref 70–99)

## 2022-12-11 NOTE — Progress Notes (Signed)
FAMILY REQUESTS ALL MEDICAL INFORMATION BE GIVEN TO PATIENT'S SON-IN-LAW, EVEN WHEN OTHER FAMILY IS AT BEDSIDE AND HE IS NOT   [place], [place]

## 2022-12-11 NOTE — Progress Notes (Signed)
NAME:  Pamela Johnston, MRN:  [id number], DOB:  09-Mar-1941, LOS: 12 ADMISSION DATE:  11/29/2022, CONSULTATION DATE:  12/21 REFERRING MD:  Leonel Ramsay, REASON FOR CONSULT:   ventilator management in setting of large intraparenchymal hemorrage  Brief summary:  82 y/o female admitted on 12/21 with large L parietal, temporal acute intracerebral hemorrhage with extension in to basal ganglia and intrventricular hemorrhage with significant vasogenic edema and mass effect.  Intubated, given hypertonic saline.  NSGY felt no intervention would be helpful.  Prognosis poor, goals of care discussions ongoing.  Pertinent  Medical History  Hypertension Sacroilititis Restless legs syndrome osteopenia  Significant Hospital Events: Including procedures, antibiotic start and stop dates in addition to other pertinent events   12/21 - Presented to [facility] ED via EMS as Code Stroke. LKW 2000. Abrupt onset of decreased responsiveness, aphasia and R hemiplegia. CT Head with large L hemispheric parietal/temporal lobe IPH with extension into basal ganglia and IVH. HTS 3% started. Stroke admitting, NSGY consulted. PCCM consult for vent management. Treated with unasyn for aspiration pneumonia  12/24 palliative care consulted, family requested meeting to be delayed 12/25 Family requested goals of care meeting to be delayed 12/26 stopped unasyn, replete phos; [name] conversation: family says full code 12/27 restarted unasyn for fever, RLL infiltrate; resp culture: enterobacter, MDR. Palliative consulted  12/28 CT head unchanged 12/29 unasyn changed to bactrim 12/31 posturing, CT head with increased shift, increased bleeding  Interim History / Subjective:   NAEO   Objective   Blood pressure (!) 130/46, pulse 76, temperature 98.5 F (36.9 C), temperature source Axillary, resp. rate 20, height 5\' 1"  (1.549 m), weight 60 kg, SpO2 100 %.    Vent Mode: PRVC FiO2 (%):  [30 %-40 %] 30 % Set Rate:  [16 bmp] 16 bmp Vt  Set:  [380 mL] 380 mL PEEP:  [5 cmH20] 5 cmH20 Pressure Support:  [10 cmH20] 10 cmH20   Intake/Output Summary (Last 24 hours) at 12/11/2022 0946 Last data filed at 12/11/2022 0800 Gross per 24 hour  Intake 1413.33 ml  Output 2325 ml  Net -911.67 ml   Filed Weights   12/02/22 0458 12/06/22 0500 12/07/22 0500  Weight: 58.3 kg 60.1 kg 60 kg    Examination:  General:  Ill appearing elderly F intubated  HENT: NCAT ETT secure  PULM: CTAb symmetrical chest expansion, mechanically ventilated  CV: rr cap refill < 3 sec  GI: soft ndnt  MSK: anasarca  Neuro: pupils are equal and sluggish. 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. Has spontaneous respirations     Resolved Hospital Problem list   Hypotension  Assessment & Plan:    Devastating L parietal, temporal acute ICH, extending to basal ganglia with IVH + mass effect, vasogenic edema, midline shift, subfalcine herniation ("brain compression") Acute hypoxic respiratory failure Concern for aspiration PNA Pleural effusion  Acute hypoxemic respiratory failure in setting of acute intraparenchymal hemorrhage Concern for aspiration pneumonia Enterobacter PNA HTN HLD Chronic sacroiliitis  Osteopenia RLS Anasarca  Discussion: 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  Anticipate severe long term disability without chance of independent function.  12/31 overall condition worsening. Recommending comfort measures.  Plan: Ongoing [place] conversations per primary/palliative. Rec transition to comfort care  Cont MV support WUA/SBT qD -- mentation precludes any meaningful progress toward extubation VAP, pulm hygiene 7d course tmp/smx Norvasc, lisinopril , PRN labetalol for SBP < 160 Statin No antithrombotic agent 2/2 bleed    Best Practice (right click and "Reselect all SmartList Selections"  daily)   Diet/type: tubefeeds DVT prophylaxis: SCD GI prophylaxis: H2B Lines: N/A Foley:  Yes, and it is no  longer needed Code Status:  full code Last date of multidisciplinary goals of care discussion [Husband updated 12/31]  CRITICAL CARE Performed by: Cristal Generous   Total critical care time: 37 minutes  Critical care time was exclusive of separately billable procedures and treating other patients. Critical care was necessary to treat or prevent imminent or life-threatening deterioration.  Critical care was time spent personally by me on the following activities: development of treatment plan with patient and/or surrogate as well as nursing, discussions with consultants, evaluation of patient's response to treatment, examination of patient, obtaining history from patient or surrogate, ordering and performing treatments and interventions, ordering and review of laboratory studies, ordering and review of radiographic studies, pulse oximetry and re-evaluation of patient's condition.  Eliseo Gum MSN, AGACNP-BC [place] for pager  12/11/2022, 9:46 AM

## 2022-12-11 NOTE — Progress Notes (Signed)
STROKE TEAM PROGRESS NOTE   INTERVAL HISTORY No family at the bedside today during a.m. rounds. Her neurological exam remains poor and she is unresponsive and not following commands.  Remains on fentanyl for sedation to avoid respiratory discomfort. Patient having mild bilateral right greater than left sided posturing today on painful stimuli.. Repeat CT head 12/09/2022 due to increased posturing showed increase left-to-right midline shift and slight increase in bleeding.  Vitals:   12/11/22 1100 12/11/22 1200 12/11/22 1300 12/11/22 1400  BP: (!) 136/55 (!) 127/50 133/64 (!) 120/48  Pulse: 97 85 87 90  Resp: 15 (!) 21 (!) 21 (!) 22  Temp:      TempSrc:      SpO2: 93% 92% 92% 92%  Weight:      Height:       CBC:  Recent Labs  Lab 12/10/22 0314 12/11/22 0511  WBC 12.1* 12.4*  HGB 9.9* 11.8*  HCT 30.4* 34.8*  MCV 97.4 94.8  PLT 216 235   Basic Metabolic Panel:  Recent Labs  Lab 12/06/22 0604 12/07/22 0145 12/08/22 0830 12/09/22 0251 12/10/22 0314 12/11/22 0511  NA 147*   < >  --    < > 145 144  K 2.7*   < >  --    < > 3.8 4.2  CL 112*   < >  --    < > 111 110  CO2 27   < >  --    < > 21* 22  GLUCOSE 113*   < >  --    < > 135* 123*  BUN 22   < >  --    < > 39* 36*  CREATININE 0.71   < >  --    < > 0.82 0.69  CALCIUM 8.4*   < >  --    < > 7.9* 8.4*  MG 2.2  --  2.2  --   --   --   PHOS 2.6  --  2.9  --   --   --    < > = values in this interval not displayed.   Lipid Panel:  No results for input(s): "CHOL", "TRIG", "HDL", "CHOLHDL", "VLDL", "LDLCALC" in the last 168 hours.  Alcohol Level  No results for input(s): "ETH" in the last 168 hours.   IMAGING past 24 hours No results found.  PHYSICAL EXAM General: non sedated patient who appears in acute distress. Cardiovascular:  Tachycardic, regular. Respiratory: Remains intubated on ventilator. Neurological: No spontaneous eye opening.  Intubated off sedation this morning.  With forced eye opening PERRL, VOR  intact.  Does not track.   Motor- with painful stimuli patient  has extensor posturing left upper extremity greater than right.  Bilateral upgoing toes.  Sensation, coordination and gait not tested.   ASSESSMENT/PLAN Pamela Johnston is a 82 y.o. female with history of hypertension and restless leg syndrome presenting with acute onset aphasia and right-sided weakness.  CT head on admission revealed very large left parietal ICH.  Neurosurgery was consulted, but it was not felt that neurosurgical intervention would be helpful to patient.  Prognosis is poor, and goals of care discussions with family are ongoing.  They are currently awaiting physician son-in-law to return to town for further discussions.   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  Also with new small focus of hemorrhage and edema R parietal lobe.  MD reviewed with patient's family at bedside. CTA head & neck no LVO or other emergent finding, no vascular abnormality underlying ICH CT repeat 12/23 stable hematoma, midline shift 12 mm CT repeat 12/25 stable hematoma, midline shift 1.2 cm.  No hydrocephalus CT repeat 12/28 stable hematoma, midline shift 13-69mm. No hydro 2D Echo EF 60 to 65% LDL 107 HgbA1c 5.5 VTE prophylaxis -SCDs No antithrombotic prior to admission, now on No antithrombotic secondary to [place] Therapy recommendations: Pending Disposition: palliative care on board but family wants full code for now.  Full team meeting may be beneficial when family ready.  Per husband, not ready today.  Cerebral edema Severe cytotoxic  edema, trans falcine and downward brain herniation seen on CT; worse on CT today. Developing hydrocephalus seen on CT, neurosurgery declines to intervene as this will not change her prognosis Na 166->154->156-149-152-149-147-149 - 147 3% saline off -> NS @ 35->off On TF Will allow Na gradually trending down as already one week out of onset CT repeat 12/25 stable hematoma, midline shift 1.2 cm no hydrocephalus. CT repeat 12/28 stable hematoma, midline shift 13-42mm. No hydrocephalus.  Hypertension Home meds: None Stable but on low end  Systolic blood pressure goal < 160 Long-term BP goal normotensive  Hyperlipidemia Home meds: None LDL 107, goal < 70 May consider statin at discharge  Respiratory failure Fever  Leukocytosis Patient was intubated for airway protection Ventilator management per CCM CXR repeated today due to leukocytosis- atelectasis per exam b/l. Fentanyl on hold but RN to restart given current tachycardia. Tmax 100.4->101.3->100.9->100.5->100.9->99 Leukocytosis WBC 12.3-> 12.2->10.3->10.4->11.8 Off unasyn now UA WBC 6-10 CXR 12/07/22 with bibasilar pulm infiltrates/ L greater  Other Stroke Risk Factors Advanced Age >/= 4   Other Active Problems Dysphagia, OG tube on TF Hypokalemia - K 2.7->3.8 --> 3.1--> 3.9 Replaced yesterday and normal today.  Hospital day # 12   Patient neurological exam continues to be quite poor and she is still unresponsive to commands but shows extensor posturing left greater than right upper extremity on sternal rub.  Pupils equal round and sluggish.  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  The findings were discussed  with patient's son-in-law by Dr. Epimenio Foot but family still insist on continuing full care.  They will discuss further and get back to the team. 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 Appreciate ICU assistance with management.    This patient is critically ill and at significant risk of neurological worsening, death and care requires constant monitoring of vital signs, hemodynamics,respiratory and cardiac monitoring, extensive review of multiple databases, frequent neurological assessment, discussion with family, other specialists and medical decision making of high complexity.I have made any additions or clarifications directly to the above note.This critical care time does not reflect procedure time,  or teaching time or supervisory time of PA/NP/Med Resident etc but could involve care discussion time.  I spent 30 minutes of neurocritical care time  in the care of  this patient.          Gaurang Palikh,MD   To contact Stroke Continuity provider, please refer to [email or web address] After hours, contact General Neurology

## 2022-12-11 NOTE — Progress Notes (Signed)
Daily Progress Note   Patient Name: Pamela Johnston       Date: 12/11/2022 DOB: [birth year]  Age: 82 y.o. MRN#: [id number] Attending Physician: Stroke, Md, MD Primary Care Physician: [name], MD Admit Date: 11/29/2022 Length of Stay: 12 days  Reason for Consultation/Follow-up: Establishing goals of care  HPI/Patient Profile:  82 y.o. 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  Upon admission she was intubated for airway support.  No recommendations for interventions per neurosurgery. Prognosis for meaningful neurologic recovery is poor and palliative care was consulted for Pamela Johnston.    Subjective:   Subjective: Chart Reviewed. Updates received. Patient Assessed. Created space and opportunity for patient  and family to explore thoughts and feelings regarding current medical situation.  Today's Discussion: Today met with the patient at the bedside.  She is neurologically unresponsive, still on sedation.  The patient's husband was at the bedside reading her scripture.  We checked on him and how he is coping.  No other family was present.  He states he is doing okay and emphasized that he believes that she will eventually be doing okay as well.  He states that they are continuing to be hopeful for miracle.  He states that his daughter and son-in-law have been coming to the hospital frequently.  We discussed that it would be a good idea to have another family meeting to update on current progress and how she has done in the past week.  He is not ready for that at this time but has agreed to a family meeting on Friday at 2:00 PM.  He states that he will notify his daughter and son-in-law.  I told him I would continue to keep him and  his family and my thoughts and prayers.  I provided emotional and general support through therapeutic listening, empathy, sharing of stories, and other techniques. I answered all questions and addressed all concerns to the best of my ability.  Review of Systems  Unable to perform ROS: Acuity of condition    Objective:   Vital Signs:  BP (!) 130/46   Pulse 76   Temp 98.5 F (36.9 C) (Axillary)   Resp 20   Ht _0  (1.549 m)   Wt 60 kg   SpO2 100%   BMI 24.99 kg/m   Physical Exam: Physical Exam Vitals and nursing note reviewed.  Constitutional:      General: She is not in acute distress.    Appearance: She is ill-appearing.     Interventions: She is sedated and intubated.  HENT:     Head: Normocephalic and atraumatic.  Pulmonary:     Effort: Pulmonary effort is normal. She is intubated.  Abdominal:     General: Abdomen is flat.     Palpations: Abdomen is soft.  Skin:    General: Skin is warm and dry.  Neurological:     Mental Status: She is unresponsive.     Palliative Assessment/Data: 10%    Existing Vynca/ACP Documentation: None  Assessment & Plan:   Impression: Present on Admission:  ICH (intracerebral hemorrhage) ([place])  82 year old female with chronic comorbidities and acute presentation as described above. We had an extensive discussion with family today. They continue to remain hopeful and are confident that they will receive a miracle. Last week we  discussed the medical and scientific aspects. I supported their desire to continue to hope for a miracle. However, we are realistic with time frames when decisions would be needed about trach and PEG. This seems to bring some reality to the patient's husband. Family continues to discuss amongst themselves, wishes for full scope of care, hopeful for a miracle. Overall long-term prognosis poor. Agreeable to family meeting Friday 1/5 at 2:00 pm.  SUMMARY OF RECOMMENDATIONS   Continue full code Continue full  scope of care Ongoing emotional and spiritual support of patient and family Family to continue to discuss goals amongst themselves Family meeting scheduled for Friday at 2:00 pm Until then allow time for outcomes PMT will follow peripherally/chart check until family meeting  Symptom Management:  Per primary team PMT is available to assist as needed  Code Status: Full code  Prognosis: Unable to determine  Discharge Planning: To Be Determined  Discussed with: Patient's family, medical team, nursing team  Thank you for allowing [place] to participate in the care of MARBETH SMEDLEY PMT will continue to support holistically.  Billing based on MDM: High  Problems Addressed: One acute or chronic illness or injury that poses a threat to life or bodily function  Amount and/or Complexity of Data: Category 3:Discussion of management or test interpretation with external physician/other qualified health care professional/appropriate source (not separately reported)  Risks: N/A  Pamela Field, Pamela Johnston Palliative Medicine Team  Team Phone # [phone] (Nights/Weekends)  08/08/2021, 8:17 AM

## 2022-12-12 DIAGNOSIS — Z9911 Dependence on respirator [ventilator] status: Secondary | ICD-10-CM

## 2022-12-12 DIAGNOSIS — Z7189 Other specified counseling: Secondary | ICD-10-CM

## 2022-12-12 DIAGNOSIS — G2581 Restless legs syndrome: Secondary | ICD-10-CM

## 2022-12-12 DIAGNOSIS — I611 Nontraumatic intracerebral hemorrhage in hemisphere, cortical: Secondary | ICD-10-CM | POA: Diagnosis not present

## 2022-12-12 DIAGNOSIS — J9601 Acute respiratory failure with hypoxia: Secondary | ICD-10-CM | POA: Diagnosis not present

## 2022-12-12 DIAGNOSIS — I616 Nontraumatic intracerebral hemorrhage, multiple localized: Secondary | ICD-10-CM

## 2022-12-12 DIAGNOSIS — J150 Pneumonia due to Klebsiella pneumoniae: Secondary | ICD-10-CM

## 2022-12-12 LAB — GLUCOSE, CAPILLARY
Glucose-Capillary: 149 mg/dL — ABNORMAL HIGH (ref 70–99)
Glucose-Capillary: 147 mg/dL — ABNORMAL HIGH (ref 70–99)
Glucose-Capillary: 133 mg/dL — ABNORMAL HIGH (ref 70–99)
Glucose-Capillary: 130 mg/dL — ABNORMAL HIGH (ref 70–99)
Glucose-Capillary: 137 mg/dL — ABNORMAL HIGH (ref 70–99)
Glucose-Capillary: 160 mg/dL — ABNORMAL HIGH (ref 70–99)

## 2022-12-12 MED ORDER — FENTANYL 2500MCG IN NS 250ML (10MCG/ML) PREMIX INFUSION
0.0000 ug/h | INTRAVENOUS | Status: DC
  Administered 2022-12-12: 50 ug/h via INTRAVENOUS

## 2022-12-12 NOTE — Progress Notes (Addendum)
STROKE TEAM PROGRESS NOTE   INTERVAL HISTORY Patient is seen in her room with her husband at bedside.  Vital signs have been stable, and neurological exam  remains poor with patient comatose and unresponsive.  Pupils are sluggishly reactive.  Right coronary is absent left sluggish.  She has some spontaneous chewing movements.  She has triple flexion withdrawal in lower extremities and no response in upper extremities.  Vitals:   12/12/22 0800 12/12/22 0900 12/12/22 1000 12/12/22 1116  BP: (!) 121/54 132/60 132/62 (!) 128/54  Pulse: 97 96 97 91  Resp:    (!) 21  Temp:  99 F (37.2 C)    TempSrc:  Oral    SpO2: 97% 97% 94% 98%  Weight:      Height:       CBC:  Recent Labs  Lab 12/10/22 0314 12/11/22 0511  WBC 12.1* 12.4*  HGB 9.9* 11.8*  HCT 30.4* 34.8*  MCV 97.4 94.8  PLT 216 188    Basic Metabolic Panel:  Recent Labs  Lab 12/06/22 0604 12/07/22 0145 12/08/22 0830 12/09/22 0251 12/10/22 0314 12/11/22 0511  NA 147*   < >  --    < > 145 144  K 2.7*   < >  --    < > 3.8 4.2  CL 112*   < >  --    < > 111 110  CO2 27   < >  --    < > 21* 22  GLUCOSE 113*   < >  --    < > 135* 123*  BUN 22   < >  --    < > 39* 36*  CREATININE 0.71   < >  --    < > 0.82 0.69  CALCIUM 8.4*   < >  --    < > 7.9* 8.4*  MG 2.2  --  2.2  --   --   --   PHOS 2.6  --  2.9  --   --   --    < > = values in this interval not displayed.    Lipid Panel:  No results for input(s): "CHOL", "TRIG", "HDL", "CHOLHDL", "VLDL", "LDLCALC" in the last 168 hours.  Alcohol Level  No results for input(s): "ETH" in the last 168 hours.   IMAGING past 24 hours No results found.  PHYSICAL EXAM General: non sedated patient who appears in acute distress. Cardiovascular:  Tachycardic, regular. Respiratory: Remains intubated on ventilator. Neurological: No spontaneous eye opening.  Intubated off sedation this morning.  With forced eye opening pupils equal round and sluggishly reactive, weak corneal reflexes,  does not track.   Motor- with painful stimuli patient has no movement of bilateral upper upper extremities, triple flexion of bilateral lower extremities.    Sensation, coordination and gait not tested.   ASSESSMENT/PLAN Ms. TIZIANA CISLO is a 82 y.o. female with history of hypertension and restless leg syndrome presenting with acute onset aphasia and right-sided weakness.  CT head on admission revealed very large left parietal ICH.  Neurosurgery was consulted, but it was not felt that neurosurgical intervention would be helpful to patient.  Prognosis is poor, and goals of care discussions with family are ongoing.  They are currently awaiting physician son-in-law to return to town for further discussions.   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  Also with new small focus of hemorrhage and edema R parietal lobe.  MD reviewed with patient's family at bedside. CTA head & neck no LVO or other emergent finding, no vascular abnormality underlying ICH CT repeat 12/23 stable hematoma, midline shift 12 mm CT repeat 12/25 stable hematoma, midline shift 1.2 cm.  No hydrocephalus CT repeat 12/28 stable hematoma, midline shift 13-7mm. No hydro 2D Echo EF 60 to 65% LDL 107 HgbA1c 5.5 VTE prophylaxis -SCDs No antithrombotic prior to admission, now on No antithrombotic secondary to [place] Therapy recommendations: Pending Disposition: palliative care on board but family wants full code for now.  Full team meeting may be beneficial when family ready.  Per husband, not ready today.  Cerebral edema Severe cytotoxic  edema, trans falcine and downward brain herniation seen on CT; worse on CT today. Developing hydrocephalus seen on CT, neurosurgery declines to intervene as this will not change her prognosis Na 166->154->156-149-152-149-147-149 - 147 3% saline off -> NS @ 35->off On TF Will allow Na gradually trending down as already one week out of onset CT repeat 12/25 stable hematoma, midline shift 1.2 cm no hydrocephalus. CT repeat 12/28 stable hematoma, midline shift 13-12mm. No hydrocephalus.  Hypertension Home meds: None Stable but on low end  Systolic blood pressure goal < 160 Long-term BP goal normotensive  Hyperlipidemia Home meds: None LDL 107, goal < 70 May consider statin at discharge  Respiratory failure Fever  Leukocytosis Patient was intubated for airway protection Ventilator management per CCM CXR repeated today due to leukocytosis- atelectasis per exam b/l. Tmax 100.4->101.3->100.9->100.5->100.9->99-> 101 Leukocytosis WBC 12.3-> 12.2->10.3->10.4->11.8-> 12.4 Off unasyn now UA WBC 6-10 CXR 12/07/22 with bibasilar pulm infiltrates/ L greater  Other Stroke Risk Factors Advanced Age >/= 24   Other Active Problems Dysphagia, OG tube on TF Hypokalemia - K 2.7->3.8 --> 3.1--> 3.9 Replaced yesterday and normal today.  Hospital day # [place] , MSN, AGACNP-BC [facility] See Amion for schedule and pager information 12/12/2022 1:06 PM   I have personally obtained history,examined this patient, reviewed notes, independently viewed imaging studies, participated in medical decision making and plan of care.ROS completed by me personally and pertinent positives fully documented  I have made any additions or clarifications directly to the above note. Agree with note above.  Patient neurological exam remains poor with mostly elicitable brainstem reflexes no voluntary response.  Chances of surviving such a massive devastating intracerebral hemorrhage with  significant brain herniation and cytotoxic edema nonexistent.  I had a long discussion yesterday with the patient's daughter at the bedside but our conversation led nowhere.  She is strongly believing faith and family is hoping for a miracle and she did not want me to talk about poor prognosis and in fact has requested that I be removed as this patient is attending.  I have spoken to Dr. Ernest Mallick critical care medicine who will take over as attending.  I will be available if needed to consult on this case going forward but will not see her daily as requested by the family.  Family has also requested that all communication be only with patient's son-in-law who is a physician and not with any other family members even  if they are present at the bedside.  Discussed with Dr. Zoe Lan critical care medicine.This patient is critically ill and at significant risk of neurological worsening, death and care requires constant monitoring of vital signs, hemodynamics,respiratory and cardiac monitoring, extensive review of multiple databases, frequent neurological assessment, discussion with family, other specialists and medical decision making of high complexity.I have made any additions or clarifications directly to the above note.This critical care time does not reflect procedure time, or teaching time or supervisory time of PA/NP/Med Resident etc but could involve care discussion time.  I spent 30 minutes of neurocritical care time  in the care of  this patient.      Delia Heady, MD Medical Director [facility] Pager: [phone] 12/12/2022 3:29 PM   To contact Stroke Continuity provider, please refer to [email or web address]. After hours, contact General Neurology

## 2022-12-12 NOTE — IPAL (Addendum)
  Addendum: met with daughter Pamela Johnston. As a family they will discuss what functional QOL [name] would find acceptable, and consideration of what her wishes would be RE prolonged support measures, trach/peg etc  Family is coming to down Friday/this weekend.  Palliative has mtg planned for Friday     Interdisciplinary Goals of Care Family Meeting   Date carried out: 12/12/2022  Location of the meeting: Phone conference  Member's involved: Nurse Practitioner and Family Member or next of kin  Durable Power of Attorney or acting medical decision maker: Daughter Pamela Johnston.   Husband has dementia. Pamela Johnston has asked that Pamela Johnston (pt son in law, Pamela Johnston's husband) be contacted for medical updates.      Discussion: We discussed goals of care for Pamela Johnston .    I spoke with dave re clinical status. It sounds like this hospital course has been complicated by several instances where the family had been given very definitive prognostic information (I.e "she will not survive beyond 3 days) which has not matched the patient's course. This has been difficult as it creates a degree of mistrust as well as more hope. There are feelings of being dismissed which has also made communications difficult.   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 I encouraged him to talk with Pamela Johnston about what functional outcomes and QOL would be acceptable to the patient. If aggressive care were to continue, this would certainly involve a trach, nursing facility, likely PEG, possible prolonged MV support, and is very unlikely to facilitate the amount of change needed to yield a meaningful neurologic outcome. If the patient would not find such a path/QOL/functional outcome acceptable, that would guide decisions moving forward.   He will talk  with Pamela Johnston and asked that I do the same when she arrives.  Continue current care at present   Code status:   Code Status: Full Code    Disposition: Continue current acute care  Time spent for the meeting: 28 min    Cristal Generous, NP  12/12/2022, 11:41 AM      I met with Pamela Johnston's daughter this afternoon with NP Bowser to discuss her ongoing care and hear her concerns. She reports they understand the facts and therefore know it would be a miracle for her to survive, but she wants to wait on that miracle. Her husband is a physician and wants to be the primary point of contact for the patient but she appreciates being involved. Her brother is coming to town this weekend and we discussed the benefit of a family meeting at some point when everyone can be present to ensure everyone has an opportunity to ask questions and participate in discussions regarding her future care. Con't all aggressive care measures.  Julian Hy, DO 12/12/22 5:05 PM [facility]  For contact information, see Amion. If no response to pager, please call PCCM consult pager. After hours, 7PM- 7AM, please call Elink.

## 2022-12-12 NOTE — Progress Notes (Addendum)
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  Intubated, given hypertonic saline.  NSGY felt no intervention would be helpful.  Prognosis poor, goals of care discussions ongoing.  Pertinent  Medical History  Hypertension Sacroilititis Restless legs syndrome osteopenia  Significant Hospital Events: Including procedures, antibiotic start and stop dates in addition to other pertinent events   12/21 - Presented to [facility] ED via EMS as Code Stroke. LKW 2000. Abrupt onset of decreased responsiveness, aphasia and R hemiplegia. CT Head with large L hemispheric parietal/temporal lobe IPH with extension into basal ganglia and IVH. HTS 3% started. Stroke admitting, NSGY consulted. PCCM consult for vent management. Treated with unasyn for aspiration pneumonia  12/24 palliative care consulted, family requested meeting to be delayed 12/25 Family requested goals of care meeting to be delayed 12/26 stopped unasyn, replete phos; [name] conversation: family says full code 12/27 restarted unasyn for fever, RLL infiltrate; resp culture: enterobacter, MDR. Palliative consulted  12/28 CT head unchanged 12/29 unasyn changed to bactrim 12/31 posturing, CT head with increased shift, increased bleeding 1/2 Family fired CVA service after discussion RE poor prognosis   Interim History / Subjective:   NAEO    Objective   Blood pressure (!) 128/54, pulse 91, temperature 99 F (37.2 C), temperature source Oral, resp. rate (!) 21, height 5\' 1"  (1.549 m), weight 60 kg, SpO2 98 %.    Vent Mode:  PSV;CPAP FiO2 (%):  [30 %-40 %] 40 % Set Rate:  [16 bmp] 16 bmp Vt Set:  [380 mL] 380 mL PEEP:  [5 cmH20] 5 cmH20 Pressure Support:  [10 cmH20] 10 cmH20 Plateau Pressure:  [16 cmH20] 16 cmH20   Intake/Output Summary (Last 24 hours) at 12/12/2022 1136 Last data filed at 12/12/2022 1000 Gross per 24 hour  Intake 1431.4 ml  Output 1500 ml  Net -68.6 ml   Filed Weights   12/02/22 0458 12/06/22 0500 12/07/22 0500  Weight: 58.3 kg 60.1 kg 60 kg    Examination:  General:  Critically ill appearing elderly F intubated  HENT: NCAT ETT secure. Protuberant tongue  PULM: Symmetrical chest expansion, mechanically ventilated  CV: rr, s1s2, cap refill < 3 sec  GI: soft ndnt  MSK: no acute joint deformity, no cyanosis   Neuro: Posturing to sternal rub. Spontaneous respirations. 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.    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 GOC are ongoing to discuss  Minimize sedation  No antithrombotic agent 2/2 bleed   Acute respiratory failure with hypoxia Enterobacter PNA Concern for aspiration PNA  Pleural effusion  Cont MV support Pulm hygiene, VAP  TMP/SMX for 7d course   HTN HLD SBP goal < 160 Amlodipine, lisinopril, PRN labetalol Crestor   Inadequate PO intake EN  Hyperglycemia, mild Monitor. 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  Anticipate severe long term disability without chance of independent function.  Palliative care meeting is scheduled for Friday 1/5 when other family members are able to  attend In interim, family has asked that medical updates be relayed to [name] (son in law who is a physician).  See IPAL 1/3 -- I rec family to think about what an acceptable QOL/functional outcome would be for [name] as I think this will help guide decision making moving forward   Best Practice (right click and "Reselect all SmartList Selections" daily)   Diet/type: tubefeeds DVT prophylaxis: SCD GI prophylaxis: H2B Lines: N/A Foley:  Yes, and it is still needed Code Status:  full code Last date of multidisciplinary goals of care discussion [name] (son in law) 1/3]  CRITICAL CARE Performed by: Cristal Generous   Total critical care time: 37 minutes  Critical care time was exclusive of separately billable procedures and treating other patients. Critical care was necessary to treat or prevent imminent or life-threatening deterioration.  Critical care was time spent personally by me on the following activities: development of treatment plan with patient and/or surrogate as well as nursing, discussions with consultants, evaluation of patient's response to treatment, examination of patient, obtaining history from patient or surrogate, ordering and performing treatments and interventions, ordering and review of laboratory studies, ordering and review of radiographic studies, pulse oximetry and re-evaluation of patient's condition.  Eliseo Gum MSN, AGACNP-BC [place] for pager  12/12/2022, 11:36 AM

## 2022-12-12 NOTE — Progress Notes (Signed)
     Referral previously received for Christella Hartigan for goals of care discussion. Noted most recent palliative in-person assessment dated 12/11/2021 at which time it was recommended to follow from a distance/chart check.   Chart reviewed for Recent provider notes, nurse notes, TOC notes, vitals, labs, and imaging and updates received from RN.   At this time patient appears stable overall. Some worsening of head CT, continued posturing. Family has been updated by PCCM, plan to wait for out of town family for family meeting Friday at 2:00 pm. No plan for in person follow-up until Friday but we will continue to chart check given high risk for decline. Please contact the palliative medicine provider on service for any new/urgent needs that require our assistance with this patient.  Thank you for your referral and allowing PMT to assist in [place] care.   Walden Field, NP Palliative Medicine Team Phone: [phone]  NO CHARGE

## 2022-12-12 NOTE — Progress Notes (Signed)
Nutrition Follow-up  DOCUMENTATION CODES:   Not applicable  INTERVENTION:   Tube feeding via OG tube: Vital 1.2 at 55 ml/h (1320 ml per day)  Provides 1584 kcal, 99 gm protein, 1070 ml free water daily  NUTRITION DIAGNOSIS:   Inadequate oral intake related to inability to eat as evidenced by NPO status. Ongoing.   GOAL:   Provide needs based on ASPEN/SCCM guidelines Met with TF at goal   MONITOR:   Vent status, TF tolerance, Labs, Weight trends  REASON FOR ASSESSMENT:   Consult Enteral/tube feeding initiation and management  ASSESSMENT:   82 year old female with PMHx of HTN, osteopenia, RLS, sacroiliitis admitted with large left hemispheric parietal/temporal IPH with vasogenic edema/mass effect, also with acute respiratory failure and concern for aspiration PNA given emesis per-intubation.  Pt discussed during ICU rounds and with RN.  Plan for further [place] discussion with family due to catastrophic bleed.    Medications reviewed and include: pepcid Fentanyl   Labs reviewed CBG's: 103-149  36F OG tube; tip in body of stomach   Diet Order:   Diet Order             Diet NPO time specified  Diet effective now                   EDUCATION NEEDS:   Not appropriate for education at this time  Skin:  Skin Assessment: Reviewed RN Assessment  Last BM:  400 ml via FMS  Height:   Ht Readings from Last 1 Encounters:  11/29/22 _0  (1.549 m)    Weight:   Wt Readings from Last 1 Encounters:  12/07/22 60 kg    Ideal Body Weight:  47.7 kg  BMI:  Body mass index is 24.99 kg/m.  Estimated Nutritional Needs:   Kcal:  1500-1700  Protein:  80-100 grams  Fluid:  1.5-1.7 L/day  Lockie Pares., RD, LDN, CNSC See AMiON for contact information

## 2022-12-12 NOTE — Progress Notes (Signed)
[place] Progress Note Patient Name: Pamela Johnston DOB: 1940-12-20 MRN: [id number]   Date of Service  12/12/2022  HPI/Events of Note  Agitation  Ventilator Asynchrony - Patient over breathing set ventilator rate.   eICU Interventions  Plan: Restart Fentanyl IV infusion (0-200 mcg/hour). Titrate to RASS = 0 to -1.      Intervention Category Major Interventions: Delirium, psychosis, severe agitation - evaluation and management;Respiratory failure - evaluation and management  Lysle Dingwall 12/12/2022, 8:45 PM

## 2022-12-13 DIAGNOSIS — J9601 Acute respiratory failure with hypoxia: Secondary | ICD-10-CM | POA: Diagnosis not present

## 2022-12-13 DIAGNOSIS — J150 Pneumonia due to Klebsiella pneumoniae: Secondary | ICD-10-CM | POA: Diagnosis not present

## 2022-12-13 DIAGNOSIS — Z9911 Dependence on respirator [ventilator] status: Secondary | ICD-10-CM | POA: Diagnosis not present

## 2022-12-13 LAB — GLUCOSE, CAPILLARY
Glucose-Capillary: 127 mg/dL — ABNORMAL HIGH (ref 70–99)
Glucose-Capillary: 131 mg/dL — ABNORMAL HIGH (ref 70–99)
Glucose-Capillary: 135 mg/dL — ABNORMAL HIGH (ref 70–99)
Glucose-Capillary: 136 mg/dL — ABNORMAL HIGH (ref 70–99)
Glucose-Capillary: 138 mg/dL — ABNORMAL HIGH (ref 70–99)
Glucose-Capillary: 156 mg/dL — ABNORMAL HIGH (ref 70–99)

## 2022-12-13 LAB — CBC
HCT: 30.9 % — ABNORMAL LOW (ref 36.0–46.0)
Hemoglobin: 10.1 g/dL — ABNORMAL LOW (ref 12.0–15.0)
MCH: 31.5 pg (ref 26.0–34.0)
MCHC: 32.7 g/dL (ref 30.0–36.0)
MCV: 96.3 fL (ref 80.0–100.0)
Platelets: 378 10*3/uL (ref 150–400)
RBC: 3.21 MIL/uL — ABNORMAL LOW (ref 3.87–5.11)
RDW: 13.2 % (ref 11.5–15.5)
WBC: 10.8 10*3/uL — ABNORMAL HIGH (ref 4.0–10.5)
nRBC: 0 % (ref 0.0–0.2)

## 2022-12-13 MED ORDER — LACTATED RINGERS IV BOLUS
1000.0000 mL | Freq: Once | INTRAVENOUS | Status: AC
  Administered 2022-12-13: 1000 mL via INTRAVENOUS

## 2022-12-13 MED ORDER — SODIUM CHLORIDE 0.9 % IV SOLN
250.0000 mL | INTRAVENOUS | Status: DC
  Administered 2022-12-13: 250 mL via INTRAVENOUS

## 2022-12-13 MED ORDER — BETHANECHOL CHLORIDE 10 MG PO TABS
10.0000 mg | ORAL_TABLET | Freq: Three times a day (TID) | ORAL | Status: DC
  Administered 2022-12-13 – 2022-12-23 (×29): 10 mg
  Filled 2022-12-13 (×29): qty 1

## 2022-12-13 MED ORDER — BROMOCRIPTINE MESYLATE 2.5 MG PO TABS
1.2500 mg | ORAL_TABLET | Freq: Two times a day (BID) | ORAL | Status: DC
  Administered 2022-12-13 – 2022-12-23 (×20): 1.25 mg
  Filled 2022-12-13 (×21): qty 1

## 2022-12-13 MED ORDER — FENTANYL CITRATE PF 50 MCG/ML IJ SOSY
PREFILLED_SYRINGE | INTRAMUSCULAR | Status: AC
  Filled 2022-12-13: qty 1

## 2022-12-13 MED ORDER — PHENYLEPHRINE HCL-NACL 20-0.9 MG/250ML-% IV SOLN: 25.0000 ug/min | INTRAVENOUS | Status: DC

## 2022-12-13 MED ORDER — FENTANYL CITRATE PF 50 MCG/ML IJ SOSY
25.0000 ug | PREFILLED_SYRINGE | INTRAMUSCULAR | Status: DC | PRN
  Administered 2022-12-13 – 2022-12-16 (×14): 25 ug via INTRAVENOUS
  Filled 2022-12-13 (×14): qty 1

## 2022-12-13 NOTE — Progress Notes (Signed)
NAME:  Pamela Johnston, MRN:  [id number], DOB:  07-07-1941, LOS: 77 ADMISSION DATE:  11/29/2022, CONSULTATION DATE:  12/21 REFERRING MD:  Leonel Ramsay, REASON FOR CONSULT:   ventilator management in setting of large intraparenchymal hemorrage  Brief summary:  82 y/o female admitted on 12/21 with large L parietal, temporal acute intracerebral hemorrhage with extension in to basal ganglia and intrventricular hemorrhage with significant vasogenic edema and mass effect.  Intubated, given hypertonic saline.  NSGY felt no intervention would be helpful.  Prognosis poor, goals of care discussions ongoing.  Pertinent  Medical History  Hypertension Sacroilititis Restless legs syndrome osteopenia  Significant Hospital Events: Including procedures, antibiotic start and stop dates in addition to other pertinent events   12/21 - Presented to [facility] ED via EMS as Code Stroke. LKW 2000. Abrupt onset of decreased responsiveness, aphasia and R hemiplegia. CT Head with large L hemispheric parietal/temporal lobe IPH with extension into basal ganglia and IVH. HTS 3% started. Stroke admitting, NSGY consulted. PCCM consult for vent management. Treated with unasyn for aspiration pneumonia  12/24 palliative care consulted, family requested meeting to be delayed 12/25 Family requested goals of care meeting to be delayed 12/26 stopped unasyn, replete phos; [name] conversation: family says full code 12/27 restarted unasyn for fever, RLL infiltrate; resp culture: enterobacter, MDR. Palliative consulted  12/28 CT head unchanged 12/29 unasyn changed to bactrim 12/31 posturing, CT head with increased shift, increased bleeding 1/2 Family fired CVA service after discussion RE poor prognosis  1/4 off sedation   Interim History / Subjective:   Fent gtt was added back overnight for incr RR. Transiently was on neo after getting sedation. Since has weaned off  Temp has normalized  WBC down to 10.8  Objective   Blood  pressure (!) 116/48, pulse 80, temperature 97.6 F (36.4 C), temperature source Oral, resp. rate 18, height 5\' 1"  (1.549 m), weight 60 kg, SpO2 97 %.    Vent Mode: PRVC FiO2 (%):  [30 %-40 %] 30 % Set Rate:  [16 bmp] 16 bmp Vt Set:  [380 mL] 380 mL PEEP:  [5 cmH20] 5 cmH20 Pressure Support:  [8 cmH20-10 cmH20] 8 cmH20   Intake/Output Summary (Last 24 hours) at 12/13/2022 1109 Last data filed at 12/13/2022 0900 Gross per 24 hour  Intake 1781.08 ml  Output 965 ml  Net 816.08 ml   Filed Weights   12/02/22 0458 12/06/22 0500 12/07/22 0500  Weight: 58.3 kg 60.1 kg 60 kg    Examination:  General:  Critically ill older adult F intubated  HENT: NCAT protuberant tongue ETT secure  PULM: Symmetrical chest expansion, mechanically ventilated  CV: tachycardic, regular.  GI: soft ndnt  MSK: no acute joint deformity, no cyanosis   Neuro: Spontaneous respirations. Posturing to sternal rub     Resolved Hospital Problem list   Hypotension  Assessment & Plan:    Devastating ICH -- L parietal and temporal lobe extending to basal ganglia with IVH and mass effect, vasogenic edema, midline shift, subfalcine herniation -- "brain compression"  R parietal lobe hemorrhage with associated edema  Supportive care Unfortunately, do not think a meaningful neurologic outcome is feasible. GOC are ongoing to discuss  Dc continuous sedation No antithrombotic agent 2/2 bleed   Acute respiratory failure with hypoxia Enterobacter PNA Concern for aspiration PNA Pleural effusion  Cont MV support, wean settings as able  Pulm hygiene, VAP  TMP/SMX for 7d course   HTN HLD  Cardiac monitoring SBP goal < 160 Amlodipine, lisinopril,  PRN labetalol Crestor   Inadequate PO intake  EN  Hyperglycemia  Monitor. 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  Anticipate severe long term  disability without chance of independent function.  Palliative care meeting is scheduled for Friday 1/5 when other family members are able to attend In interim, family has asked that medical updates be relayed to [name] (son in law who is a physician).  See IPAL 1/3 -- I rec family to think about what an acceptable QOL/functional outcome would be for [facility] (right click and "Reselect all SmartList Selections" daily)   Diet/type: tubefeeds DVT prophylaxis: SCD GI prophylaxis: H2B Lines: N/A Foley:  Yes, and it is still needed Code Status:  full code Last date of multidisciplinary goals of care discussion [name] (son in law) 1/3]  CRITICAL CARE Performed by: Cristal Generous   Total critical care time: 35 minutes  Critical care time was exclusive of separately billable procedures and treating other patients.  Critical care was necessary to treat or prevent imminent or life-threatening deterioration.  Critical care was time spent personally by me on the following activities: development of treatment plan with patient and/or surrogate as well as nursing, discussions with consultants, evaluation of patient's response to treatment, examination of patient, obtaining history from patient or surrogate, ordering and performing treatments and interventions, ordering and review of laboratory studies, ordering and review of radiographic studies, pulse oximetry and re-evaluation of patient's condition.  Eliseo Gum MSN, AGACNP-BC [place] for pager  12/13/2022, 11:09 AM

## 2022-12-13 NOTE — Progress Notes (Signed)
Called [name] pt son in law and point of contact for medical updates at   [phone]   Discussed dc continuous sedation, daily updates. All questions answered   Eliseo Gum MSN, AGACNP-BC [facility] 12/13/2022, 4:26 PM

## 2022-12-13 NOTE — Progress Notes (Signed)
[place] Progress Note Patient Name: Pamela Johnston DOB: 1941/05/29 MRN: [id number]   Date of Service  12/13/2022  HPI/Events of Note  Hypotension - BP = 95/42 with MAP = 58. LVEF = 60-65%. No WMA.  eICU Interventions  Plan: Bolus with LR 1 liter IV over 1 hour now. Phenylephrine IV infusion via PIV. Titrate to MAP >= 65.     Intervention Category Major Interventions: Hypotension - evaluation and management  Cornisha Zetino [place] 12/13/2022, 4:11 AM

## 2022-12-13 NOTE — Progress Notes (Signed)
An USGPIV (ultrasound guided PIV) has been placed for short-term vasopressor infusion. A correctly placed ivWatch must be used when administering Vasopressors. Should this treatment be needed beyond 72 hours, central line access should be obtained.  It will be the responsibility of the bedside nurse to follow best practice to prevent extravasations.   ?

## 2022-12-13 NOTE — Progress Notes (Signed)
RN spoke with Dr. Carlis Abbott regarding patient's HR in high 130s.  MD gave orders for fentanyl to be given for HR in 140s.  Will administer accordingly.

## 2022-12-13 NOTE — Progress Notes (Signed)
Palliative chart check-   Chart reviewed.  Family meeting scheduled for Friday at 2pm.  Please call if assistance is needed before then.   Mariana Kaufman, AGNP-C Palliative Medicine  No charge

## 2022-12-14 DIAGNOSIS — Z515 Encounter for palliative care: Secondary | ICD-10-CM | POA: Diagnosis not present

## 2022-12-14 DIAGNOSIS — Z7189 Other specified counseling: Secondary | ICD-10-CM | POA: Diagnosis not present

## 2022-12-14 DIAGNOSIS — I611 Nontraumatic intracerebral hemorrhage in hemisphere, cortical: Secondary | ICD-10-CM | POA: Diagnosis not present

## 2022-12-14 DIAGNOSIS — I619 Nontraumatic intracerebral hemorrhage, unspecified: Secondary | ICD-10-CM | POA: Diagnosis not present

## 2022-12-14 LAB — GLUCOSE, CAPILLARY
Glucose-Capillary: 133 mg/dL — ABNORMAL HIGH (ref 70–99)
Glucose-Capillary: 135 mg/dL — ABNORMAL HIGH (ref 70–99)
Glucose-Capillary: 136 mg/dL — ABNORMAL HIGH (ref 70–99)
Glucose-Capillary: 122 mg/dL — ABNORMAL HIGH (ref 70–99)
Glucose-Capillary: 133 mg/dL — ABNORMAL HIGH (ref 70–99)
Glucose-Capillary: 134 mg/dL — ABNORMAL HIGH (ref 70–99)

## 2022-12-14 MED ORDER — SODIUM CHLORIDE 0.9 % IV BOLUS
1000.0000 mL | Freq: Once | INTRAVENOUS | Status: AC
  Administered 2022-12-14: 1000 mL via INTRAVENOUS

## 2022-12-14 MED ORDER — SODIUM CHLORIDE 0.9 % IV BOLUS
500.0000 mL | Freq: Once | INTRAVENOUS | Status: AC
  Administered 2022-12-14: 500 mL via INTRAVENOUS

## 2022-12-14 MED ORDER — LISINOPRIL 5 MG PO TABS: 5.0000 mg | ORAL_TABLET | Freq: Every day | ORAL | Status: DC

## 2022-12-14 MED ORDER — AMLODIPINE BESYLATE 2.5 MG PO TABS: 2.5000 mg | ORAL_TABLET | Freq: Every day | ORAL | Status: DC

## 2022-12-14 NOTE — Progress Notes (Addendum)
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  Intubated, given hypertonic saline.  NSGY felt no intervention would be helpful.  Prognosis poor, goals of care discussions ongoing.  Pertinent  Medical History  Hypertension Sacroilititis Restless legs syndrome osteopenia  Significant Hospital Events: Including procedures, antibiotic start and stop dates in addition to other pertinent events   12/21 - Presented to [facility] ED via EMS as Code Stroke. LKW 2000. Abrupt onset of decreased responsiveness, aphasia and R hemiplegia. Stroke admitting, NSGY consulted. PCCM consult for vent management. Treated with unasyn for aspiration pneumonia CT Head with large L hemispheric parietal/temporal lobe IPH with extension into basal ganglia and IVH. HTS 3% started.   12/24 palliative care consulted, family requested meeting to be delayed 12/25 Family requested goals of care meeting to be delayed 12/26 Stopped unasyn, replete phos; [name] GOC conversation: family says full code 12/27 restarted unasyn for fever, RLL infiltrate; resp culture: enterobacter, MDR. Palliative consulted  12/28 CT head unchanged 12/29 unasyn changed to bactrim 12/31 posturing, CT head with increased shift, increased bleeding 1/2 Family fired CVA service after discussion RE poor prognosis  1/4 off sedation   Interim History / Subjective:  No acute issues overnight  Remains on vent with increased respirations off sedation  Fever curve slightly decreased   Objective   Blood pressure (!) 146/67, pulse (!) 112,  temperature 100 F (37.8 C), temperature source Oral, resp. rate (!) 36, height 5\' 1"  (1.549 m), weight 59.7 kg, SpO2 97 %.    Vent Mode: PSV;CPAP FiO2 (%):  [30 %] 30 % Set Rate:  [16 bmp] 16 bmp Vt Set:  [380 mL] 380 mL PEEP:  [5 cmH20] 5 cmH20 Pressure Support:  [10 cmH20] 10 cmH20 Plateau Pressure:  [22 cmH20] 22 cmH20   Intake/Output Summary (Last 24 hours) at 12/14/2022 0910 Last data filed at 12/14/2022 0800 Gross per 24 hour  Intake 1426.07 ml  Output 2000 ml  Net -573.93 ml    Filed Weights   12/06/22 0500 12/07/22 0500 12/14/22 0500  Weight: 60.1 kg 60 kg 59.7 kg    Examination: General: Acute on chronically ill appearing thin deconditioned elderly  female lying in bed on mechanical ventilation, in NAD HEENT: ETT, MM pink/moist, PERRL,  Neuro: Unresponsive off continuous sedation  CV: s1s2 regular rate and rhythm, no murmur, rubs, or gallops,  PULM:  Rapid shallow respirations on vent, mild increased work of breathing GI: soft, bowel sounds active in all 4 quadrants, non-tender, non-distended, tolerating TF Extremities: warm/dry, no edema  Skin: no rashes or lesions  Resolved Hospital Problem list   Hypotension Hyperglycemia   Assessment & Plan:   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 with IVH and mass effect, vasogenic edema, midline shift, subfalcine herniation -- "brain compression"  R parietal lobe hemorrhage with associated edema  Severe Encephalopathy   P: Continue supportive care Maintain neuro protective measures; goal for eurothermia, euglycemia, eunatermia, normoxia, and PCO2 goal of 35-40 Nutrition and bowel regiment  Seizure precautions  Aspirations precautions  Poor prognosis with unlikely meaningful neurologic recovery Continue GOC discussion  Unable to anticoagulate given bleed  Bromocriptine started 1/4 for management of possible central fever   Acute respiratory failure with hypoxia Enterobacter  PNA -Culture positive 12/19 Pleural effusion  P: Continue ventilator support with lung protective strategies  Wean PEEP and FiO2 for sats greater than 90%. Head of bed elevated 30 degrees. Plateau pressures less than 30 cm H20.  Follow intermittent chest x-ray and ABG.   SAT/SBT as tolerated, mentation preclude extubation  Ensure adequate pulmonary hygiene  Follow cultures  VAP bundle in place  PAD protocol Continue Bactrim with stop date in place  HTN/HLD  P: Continuous telemetry  SBP goal < 160 Continue Amlodipine, Lisinopril, and PRN Labetalol Remains on Crestor   Inadequate PO intake  P: Continue TF  Supplement protein   Chronic sacroiliitis Osteopenia RLS Supportive care   GOC 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  Anticipate severe long term disability without chance of independent function.  Palliative care meeting is scheduled for Friday 1/5 when other family members are able to attend In interim, family has asked that medical updates be relayed to [name] (son in law who is a physician).   Best Practice (right click and "Reselect all SmartList Selections" daily)   Diet/type: tubefeeds DVT prophylaxis: SCD GI prophylaxis: H2B Lines: N/A Foley:  Yes, and it is still needed Code Status:  full code Last date of multidisciplinary goals of care discussion [name] (son in law) 1/3]  CRITICAL CARE Performed by: [name] D. Harris  Total critical care time:  40 minutes  Critical care time was exclusive of separately billable procedures and treating other patients.  Critical care was necessary to treat or prevent imminent or life-threatening deterioration.  Critical care was time spent personally by me on the following activities: development of treatment plan with patient and/or surrogate as well as nursing, discussions with consultants, evaluation of patient's response to treatment, examination of patient, obtaining  history from patient or surrogate, ordering and performing treatments and interventions, ordering and review of laboratory studies, ordering and review of radiographic studies, pulse oximetry and re-evaluation of patient's condition.  [name] D. Harris, NP-C [place] [facility] Personal contact information can be found on Amion  If no contact or response made please call [phone] 12/14/2022, 9:41 AM    ATTESTATION & SIGNATURE   STAFF NOTE: I, Dr Ann Lions have personally reviewed patient's available data, including medical history, events of note, physical examination and test results as part of my evaluation. I have discussed with resident/NP and other care providers such as pharmacist, RN and RRT.  In addition,  I personally evaluated patient and elicited key findings of   S: Date of admit 11/29/2022 with LOS 15 for today 12/14/2022 : KRISTIANN NOYCE is  remains on vent. Unresponsive. Husband at bedside  O:  Blood pressure (!) 119/46, pulse (!) 109, temperature (!) 100.6 F (38.1 C), temperature source Axillary, resp. rate (!) 33, height 5\' 1"  (1.549 m), weight 59.7 kg, SpO2 96 %.   Unresponsive On vent    LABS    PULMONARY No results for input(s): "PHART", "PCO2ART", "PO2ART", "HCO3", "TCO2", "O2SAT" in the last 168 hours.  Invalid input(s): "PCO2", "PO2"  CBC Recent Labs  Lab 12/10/22 0314 12/11/22 0511 12/13/22 0837  HGB 9.9* 11.8* 10.1*  HCT 30.4* 34.8* 30.9*  WBC 12.1* 12.4* 10.8*  PLT 216 293 378    COAGULATION No results for input(s): "INR" in the last 168 hours.  CARDIAC  No results  for input(s): "TROPONINI" in the last 168 hours. No results for input(s): "PROBNP" in the last 168 hours.   CHEMISTRY Recent Labs  Lab 12/08/22 0331 12/08/22 0830 12/09/22 0251 12/10/22 0314 12/11/22 0511  NA 147*  --  146* 145 144  K 3.1*  --  3.9 3.8 4.2  CL 115*  --  112* 111 110  CO2 24  --  23 21* 22  GLUCOSE 147*  --  127* 135* 123*  BUN 32*  --  38*  39* 36*  CREATININE 0.70  --  0.87 0.82 0.69  CALCIUM 8.0*  --  8.2* 7.9* 8.4*  MG  --  2.2  --   --   --   PHOS  --  2.9  --   --   --    Estimated Creatinine Clearance: 47.3 mL/min (by C-G formula based on SCr of 0.69 mg/dL).   LIVER No results for input(s): "AST", "ALT", "ALKPHOS", "BILITOT", "PROT", "ALBUMIN", "INR" in the last 168 hours.   INFECTIOUS No results for input(s): "LATICACIDVEN", "PROCALCITON" in the last 168 hours.   ENDOCRINE CBG (last 3)  Recent Labs    12/14/22 0315 12/14/22 0812 12/14/22 1123  GLUCAP 133* 135* 136*         IMAGING x24h  - image(s) personally visualized  -   highlighted in bold No results found.    A: Acute resp faiure Large ICH Coma  P: ful lvent support Ongoing goals of care   Anti-infectives (From admission, onward)    Start     Dose/Rate Route Frequency Ordered Stop   12/07/22 1400  sulfamethoxazole-trimethoprim (BACTRIM) 200-40 MG/5ML suspension 40 mL        40 mL Per Tube Every 12 hours 12/07/22 1313 12/17/22 0959   12/05/22 1100  Ampicillin-Sulbactam (UNASYN) 3 g in sodium chloride 0.9 % 100 mL IVPB  Status:  Discontinued        3 g 200 mL/hr over 30 Minutes Intravenous Every 6 hours 12/05/22 1014 12/07/22 1313   11/29/22 2330  Ampicillin-Sulbactam (UNASYN) 3 g in sodium chloride 0.9 % 100 mL IVPB  Status:  Discontinued        3 g 200 mL/hr over 30 Minutes Intravenous Every 6 hours 11/29/22 2316 12/04/22 0908        Rest per NP/medical resident whose note is outlined above and that I agree with  The patient is critically ill with multiple organ systems failure and requires high complexity decision making for assessment and support, frequent evaluation and titration of therapies, application of advanced monitoring technologies and extensive interpretation of multiple databases.   Critical Care Time devoted to patient care services described in this note is  15  Minutes. This time reflects time of care of this  signee Dr Brand Males. This critical care time does not reflect procedure time, or teaching time or supervisory time of PA/NP/Med student/Med Resident etc but could involve care discussion time     Dr. Brand Males, M.D., [facility].C.P Pulmonary and Critical Care Medicine Staff Physician [place] [facility] Pager: [phone], If no answer or between  15:00h - 7:00h: call [phone]  12/14/2022 12:09 PM

## 2022-12-14 NOTE — Progress Notes (Signed)
Alerted CCM NP [name] that patient was hypotensive.  Was given orders for 500cc bolus NS.  Patient's bp still remained low post bouls and NP was again notified.  Orders were given for 1L bolus of NS.  Bolus was administered.  Pressure increased to adequate range post bolus.

## 2022-12-14 NOTE — Progress Notes (Signed)
Daily Progress Note   Patient Name: Pamela Johnston       Date: 12/14/2022 DOB: 05-Apr-1941  Age: 82 y.o. MRN#: [id number] Attending Physician: Brand Males, MD Primary Care Physician: [name], MD Admit Date: 11/29/2022 Length of Stay: 15 days  Reason for Consultation/Follow-up: Establishing goals of care  HPI/Patient Profile:  82 y.o. 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  Upon admission she was intubated for airway support.  No recommendations for interventions per neurosurgery. Prognosis for meaningful neurologic recovery is poor and palliative care was consulted for Pamela Johnston conversations.     Subjective:   Subjective: Chart Reviewed. Updates received. Patient Assessed. Created space and opportunity for patient  and family to explore thoughts and feelings regarding current medical situation.  Today's Discussion: Today I met a the bedside with the paient who is unable to meaningfully communicate.  Present at the bedside was the patient's daughter Pamela Johnston and the patient's husband.  Also present at the bedside with PCCM nurse practitioner [name], NP.  Discussed that we are therefore the schedule to 2:00 family meeting.  The patient's daughter stated that the first she heard of it was this morning.  I confirmed that I talked about this and scheduled her with the patient's husband on Wednesday and he stated he would notify family.  He tapped his head and then "I am sorry but my memory has not been doing well lately."  The patient's daughter shares that he has a history of dementia which seems to have been exacerbated recently but the high stress situation.  We explained there is no reason to be embarrassed that we could  simply reschedule the family meeting for tomorrow.  The patient's family states that other family members will be arriving tomorrow morning.  We scheduled the meeting for tomorrow at 2:00.  I asked patient husband how he is holding up.  He states that he has been better and that all he wants to do is take his wife home.  Expressed my condolences for the difficult situation.  I explained that I understand how hard this is for him.  I told him we are here to help support and is anything we can do to please notify us.  After meeting with the patient and her family [name], NP I called the family [name], patient's son [name].  [name] gave an update on clinical status today.  Remains unresponsive, hypotensive, and the need to consider further interventions such as PEG and trach.  In some of the conversation the patient's son seems to feel that family would be on board with PEG and trach to allow time for a miracle.  She states that we can further talk and explain the details of these procedures as a family meeting scheduled for tomorrow.  I expect that he will be better.  I provided emotional and general support through therapeutic listening, empathy, sharing of stories, therapeutic touch, and other techniques. I answered all questions and addressed all concerns to the best of my ability.  Review of Systems  Unable to perform ROS: Acuity of condition    Objective:   Vital Signs:  BP (!) 93/38 (BP Location: Left Arm)   Pulse 99   Temp (!) 100.6 F (38.1 C) (Axillary)   Resp (!) 38   Ht  5\' 1"  (1.549 m)   Wt 59.7 kg   SpO2 96%   BMI 24.87 kg/m   Physical Exam: Physical Exam Vitals and nursing note reviewed.  Constitutional:      General: She is not in acute distress.    Appearance: She is ill-appearing.     Interventions: She is sedated and intubated.  HENT:     Head: Normocephalic and atraumatic.  Cardiovascular:     Rate and Rhythm: Normal rate.  Pulmonary:     Effort:  Pulmonary effort is normal. No respiratory distress. She is intubated.  Abdominal:     General: Abdomen is flat.     Palpations: Abdomen is soft.  Skin:    General: Skin is warm and dry.  Neurological:     Mental Status: She is unresponsive.     Palliative Assessment/Data: 10%    Existing Vynca/ACP Documentation: None  Assessment & Plan:   Impression: Present on Admission:  ICH (intracerebral hemorrhage) ([place])  82 year old female with chronic comorbidities and acute presentation as described above. We had an extensive discussion with family today. They continue to remain hopeful and are confident that they will receive a miracle. Last week we discussed the medical and scientific aspects. I supported their desire to continue to hope for a miracle. However, we are realistic with time frames when decisions would be needed about trach and PEG. This seems to bring some reality to the patient's husband. Family continues to discuss amongst themselves, wishes for full scope of care, hopeful for a miracle. Overall long-term prognosis poor. Agreeable to family meeting Saturday 1/6 at 2:00 pm.   SUMMARY OF RECOMMENDATIONS   Continue full code Continue full scope of care Ongoing emotional and spiritual support of patient and family Family to continue to discuss goals amongst themselves Family meeting scheduled for Saturday 12/15/22 at 2:00 pm PMT will continue to follow  Symptom Management:  Per primary team PMT is available to assist as needed  Code Status: Full code  Prognosis: Unable to determine  Discharge Planning: To Be Determined  Discussed with: Patient's family, medical team, nursing team  Thank you for allowing [place] to participate in the care of Pamela Johnston PMT will continue to support holistically.  Billing based on MDM: High  Problems Addressed: One acute or chronic illness or injury that poses a threat to life or bodily function  Amount and/or Complexity of Data:  Category 3:Discussion of management or test interpretation with external physician/other qualified health care professional/appropriate source (not separately reported)  Risks: N/A   Walden Field, NP Palliative Medicine Team  Team Phone # [phone] (Nights/Weekends)  08/08/2021, 8:17 AM

## 2022-12-14 NOTE — Hospital Course (Signed)
PCCM progress note  Presented to bedside with palliative care for goals of care discussion at 2 PM but unfortunately there was a miscommunication in details and all family members are not able to be present today.  Therefore decision was made to move meeting to tomorrow at 2 PM.  Was able to call and give daily update to Pamela Johnston son-in-law, over the phone and confirmed plan for goals of care talk tomorrow.   Lyndsay Talamante D. Harris, NP-C [place] [facility] Personal contact information can be found on Amion  If no contact or response made please call [phone] 12/14/2022, 3:34 PM

## 2022-12-14 NOTE — Progress Notes (Signed)
[place] Progress Note Patient Name: ANNISON BIRCHARD DOB: 1941/08/22 MRN: [id number]   Date of Service  12/14/2022  HPI/Events of Note  Urinary retention - Foley catheter D/Ced 1/4. Now has required I/O cath x 3 and now has 500 mL in bladder.   eICU Interventions  Replace Foley catheter.      Intervention Category Major Interventions: Other:  Lysle Dingwall 12/14/2022, 10:28 PM

## 2022-12-14 NOTE — Progress Notes (Signed)
Upon arriving in patients room, ETT found to be 16cm at patients lip. RT advanced tube back to 21cm at the lip. Vitals stable.

## 2022-12-14 NOTE — Progress Notes (Signed)
PCCM progress note  Presented to bedside with palliative care for goals of care discussion at 2 PM but unfortunately there was a miscommunication in details and all family members are not able to be present today.  Therefore decision was made to move meeting to tomorrow at 2 PM.  Was able to call and give daily update to Pamela Johnston, Janes son-in-law, over the phone and confirmed plan for goals of care talk tomorrow.   Pamela Bostelman D. Harris, NP-C [place] [facility] Personal contact information can be found on Amion  If no contact or response made please call [phone] 12/14/2022, 3:34 PM   

## 2022-12-15 ENCOUNTER — Inpatient Hospital Stay (HOSPITAL_COMMUNITY): Payer: Medicare PPO

## 2022-12-15 DIAGNOSIS — M461 Sacroiliitis, not elsewhere classified: Secondary | ICD-10-CM

## 2022-12-15 DIAGNOSIS — I611 Nontraumatic intracerebral hemorrhage in hemisphere, cortical: Secondary | ICD-10-CM | POA: Diagnosis not present

## 2022-12-15 DIAGNOSIS — Z515 Encounter for palliative care: Secondary | ICD-10-CM | POA: Diagnosis not present

## 2022-12-15 DIAGNOSIS — I619 Nontraumatic intracerebral hemorrhage, unspecified: Secondary | ICD-10-CM | POA: Diagnosis not present

## 2022-12-15 DIAGNOSIS — J9601 Acute respiratory failure with hypoxia: Secondary | ICD-10-CM | POA: Diagnosis not present

## 2022-12-15 DIAGNOSIS — Z7189 Other specified counseling: Secondary | ICD-10-CM | POA: Diagnosis not present

## 2022-12-15 LAB — CBC
HCT: 29.1 % — ABNORMAL LOW (ref 36.0–46.0)
Hemoglobin: 9.4 g/dL — ABNORMAL LOW (ref 12.0–15.0)
MCH: 31.4 pg (ref 26.0–34.0)
MCHC: 32.3 g/dL (ref 30.0–36.0)
MCV: 97.3 fL (ref 80.0–100.0)
Platelets: 402 10*3/uL — ABNORMAL HIGH (ref 150–400)
RBC: 2.99 MIL/uL — ABNORMAL LOW (ref 3.87–5.11)
RDW: 13.3 % (ref 11.5–15.5)
WBC: 10.8 10*3/uL — ABNORMAL HIGH (ref 4.0–10.5)
nRBC: 0 % (ref 0.0–0.2)

## 2022-12-15 LAB — COMPREHENSIVE METABOLIC PANEL
ALT: 48 U/L — ABNORMAL HIGH (ref 0–44)
AST: 62 U/L — ABNORMAL HIGH (ref 15–41)
Albumin: 2.1 g/dL — ABNORMAL LOW (ref 3.5–5.0)
Alkaline Phosphatase: 42 U/L (ref 38–126)
Anion gap: 7 (ref 5–15)
BUN: 56 mg/dL — ABNORMAL HIGH (ref 8–23)
CO2: 20 mmol/L — ABNORMAL LOW (ref 22–32)
Calcium: 8.4 mg/dL — ABNORMAL LOW (ref 8.9–10.3)
Chloride: 118 mmol/L — ABNORMAL HIGH (ref 98–111)
Creatinine, Ser: 0.99 mg/dL (ref 0.44–1.00)
GFR, Estimated: 58 mL/min — ABNORMAL LOW (ref >60)
Glucose, Bld: 134 mg/dL — ABNORMAL HIGH (ref 70–99)
Potassium: 4.4 mmol/L (ref 3.5–5.1)
Sodium: 145 mmol/L (ref 135–145)
Total Bilirubin: 0.4 mg/dL (ref 0.3–1.2)
Total Protein: 5.2 g/dL — ABNORMAL LOW (ref 6.5–8.1)

## 2022-12-15 LAB — GLUCOSE, CAPILLARY
Glucose-Capillary: 135 mg/dL — ABNORMAL HIGH (ref 70–99)
Glucose-Capillary: 143 mg/dL — ABNORMAL HIGH (ref 70–99)
Glucose-Capillary: 132 mg/dL — ABNORMAL HIGH (ref 70–99)
Glucose-Capillary: 132 mg/dL — ABNORMAL HIGH (ref 70–99)
Glucose-Capillary: 119 mg/dL — ABNORMAL HIGH (ref 70–99)
Glucose-Capillary: 133 mg/dL — ABNORMAL HIGH (ref 70–99)

## 2022-12-15 MED ORDER — SODIUM CHLORIDE 0.9 % IV SOLN: 250.0000 mL | INTRAVENOUS | Status: DC

## 2022-12-15 MED ORDER — LACTATED RINGERS IV BOLUS
1000.0000 mL | Freq: Once | INTRAVENOUS | Status: AC
  Administered 2022-12-15: 1000 mL via INTRAVENOUS

## 2022-12-15 MED ORDER — PHENYLEPHRINE HCL-NACL 20-0.9 MG/250ML-% IV SOLN
25.0000 ug/min | INTRAVENOUS | Status: DC
  Administered 2022-12-15: 45 ug/min via INTRAVENOUS
  Administered 2022-12-15: 25 ug/min via INTRAVENOUS
  Administered 2022-12-16: 45 ug/min via INTRAVENOUS
  Administered 2022-12-16: 40 ug/min via INTRAVENOUS
  Administered 2022-12-17 (×4): 60 ug/min via INTRAVENOUS
  Administered 2022-12-18: 75 ug/min via INTRAVENOUS
  Administered 2022-12-18 (×3): 60 ug/min via INTRAVENOUS
  Administered 2022-12-19 (×4): 75 ug/min via INTRAVENOUS
  Administered 2022-12-20 (×2): 65 ug/min via INTRAVENOUS
  Administered 2022-12-20: 60 ug/min via INTRAVENOUS
  Administered 2022-12-20 (×2): 65 ug/min via INTRAVENOUS
  Administered 2022-12-21: 55 ug/min via INTRAVENOUS
  Administered 2022-12-21 (×2): 40 ug/min via INTRAVENOUS
  Administered 2022-12-22: 25 ug/min via INTRAVENOUS
  Filled 2022-12-15 (×6): qty 250
  Filled 2022-12-15: qty 500
  Filled 2022-12-15 (×18): qty 250

## 2022-12-15 NOTE — Progress Notes (Addendum)
[place] Progress Note Patient Name: Pamela Johnston DOB: 1941-10-14 MRN: [id number]   Date of Service  12/15/2022  HPI/Events of Note  Hypotension - BP = 61/35 with MAP = 45. No central venous line or CVP avalable. LVEF = 60-65%. No WMA.  eICU Interventions  Plan: LR 1 liter IV over 1 hour now.  Phenylephrine IV infusion via PIV. Titrate to MAP > 65.     Intervention Category Major Interventions: Hypotension - evaluation and management  Savian Mazon Eugene 12/15/2022, 5:19 AM

## 2022-12-15 NOTE — Progress Notes (Signed)
Pt currently has ultrasound guided PIV's in both left and right anterior FA (vasopressors can but given at both sites)
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
## 2022-12-15 NOTE — Progress Notes (Signed)
Patient was breathing 38x/min on the vent. Administered 9mcg fentanyl @ 0329.  At 0415 vent alarming and RR now 48. Administered the 1mg  of PRN versed. SBP now in the 60s, MAP 40-50. Notified [place]. Awaiting orders.

## 2022-12-15 NOTE — Progress Notes (Signed)
NAME:  Pamela Johnston, MRN:  [id number], DOB:  Jul 03, 1941, LOS: 14 ADMISSION DATE:  11/29/2022, CONSULTATION DATE:  12/21 REFERRING MD:  Leonel Ramsay, REASON FOR CONSULT:   ventilator management in setting of large intraparenchymal hemorrage  Brief summary:  82 y/o female admitted on 12/21 with large L parietal, temporal acute intracerebral hemorrhage with extension in to basal ganglia and intrventricular hemorrhage with significant vasogenic edema and mass effect.  Intubated, given hypertonic saline.  NSGY felt no intervention would be helpful.  Prognosis poor, goals of care discussions ongoing.  Pertinent  Medical History  Hypertension Sacroilititis Restless legs syndrome osteopenia  Significant Hospital Events: Including procedures, antibiotic start and stop dates in addition to other pertinent events   12/21 - Presented to [facility] ED via EMS as Code Stroke. LKW 2000. Abrupt onset of decreased responsiveness, aphasia and R hemiplegia. Stroke admitting, NSGY consulted. PCCM consult for vent management. Treated with unasyn for aspiration pneumonia CT Head with large L hemispheric parietal/temporal lobe IPH with extension into basal ganglia and IVH. HTS 3% started.   12/24 palliative care consulted, family requested meeting to be delayed 12/25 Family requested goals of care meeting to be delayed 12/26 Stopped unasyn, replete phos; [name] GOC conversation: family says full code 12/27 restarted unasyn for fever, RLL infiltrate; resp culture: enterobacter, MDR. Palliative consulted  12/28 CT head unchanged 12/29 unasyn changed to bactrim 12/31 posturing, CT head with increased shift, increased bleeding 1/2 Family fired CVA service after discussion RE poor prognosis  1/4 off sedation  1/5- No acute issues overnight .,  Remains on vent with increased respirations off sedation . Fever curve slightly decreased   Interim History / Subjective:    1/6 - pall care meeting moved to 2pm today. [name] the  son-in-law will be present. On ven 3-%/ On TF. Neo not started last night. MAP > 65 after fluids bolus.   fEbrile +.   Last CT head 12/09/22. Husband at bedside holding her hand. 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 ASked if pall care meeting today was a good opportunity to discuss " he said "yes".  Objective   Blood pressure (!) 107/58, pulse (!) 103, temperature (!) 100.6 F (38.1 C), temperature source Axillary, resp. rate (!) 38, height 5\' 1"  (1.549 m), weight 59.7 kg, SpO2 97 %.    Vent Mode: PRVC FiO2 (%):  [30 %] 30 % Set Rate:  [16 bmp] 16 bmp Vt Set:  [380 mL] 380 mL PEEP:  [5 cmH20] 5 cmH20 Pressure Support:  [10 cmH20] 10 cmH20 Plateau Pressure:  [21 cmH20-26 cmH20] 21 cmH20   Intake/Output Summary (Last 24 hours) at 12/15/2022 0942 Last data filed at 12/15/2022 0800 Gross per 24 hour  Intake 2350.37 ml  Output 1425 ml  Net 925.37 ml   Filed Weights   12/07/22 0500 12/14/22 0500 12/15/22 0500  Weight: 60 kg 59.7 kg 59.7 kg    Examination: General Appearance:  Looks criticall ill Head:  Normocephalic, without obvious abnormality, atraumatic Eyes:  PERRL - yes, conjunctiva/corneas - mudd     Ears:  Normal external ear canals, both ears Nose:  G tube - cortrak with TF Throat:  ETT TUBE - yes , OG tube - no Neck:  Supple,  No enlargement/tenderness/nodules Lungs: Clear to auscultation bilaterally, Ventilator   Synchrony - yes 30% Heart:  S1 and S2  normal, no murmur, CVP - no.  Pressors - no Abdomen:  Soft, no masses, no organomegaly Genitalia / Rectal:  Not done Extremities:  Extremities- intact Skin:  ntact in exposed areas . Sacral area - not examine Neurologic:  Sedation - none -> RASS - -4 equiavleent. GCS 7 per [facility] Problem list   Hypotension Hyperglycemia   Assessment & Plan:   Devastating ICH - L parietal and  temporal lobe extending to basal ganglia with IVH and mass effect, vasogenic edema, midline shift, subfalcine herniation -- "brain compression"  R parietal lobe hemorrhage with associated edema  Severe Encephalopathy    1/6 - remains comatose. GCS 7 per RN  P: Continue supportive care Maintain neuro protective measures; goal for eurothermia, euglycemia, eunatermia, normoxia, and PCO2 goal of 35-40 Nutrition and bowel regiment  Seizure precautions  Aspirations precautions  Poor prognosis with unlikely meaningful neurologic recovery Continue GOC discussion  Unable to anticoagulate given bleed    Acute respiratory failure with hypoxia du eto large ICH 11/29/2022 and c/b Enterobacter PNA - -Culture positive 12/27 VAP   - 12/15/2022 - > does NOT meet criteria for SBT/Extubation in setting of Acute Respiratory Failure due to coma  P: Continue ventilator support with lung protective strategies  Wean PEEP and FiO2 for sats greater than 90%. Head of bed elevated 30 degrees. Plateau pressures less than 30 cm H20.  Follow intermittent chest x-ray and ABG.   SAT/SBT as tolerated, mentation preclude extubation  Ensure adequate pulmonary hygiene  Follow cultures  VAP bundle in place  PAD protocol   Enterobacter vAP 12/27 - on bactrim since 12/29 FEVER - Bromocriptine started 1/4 for management of possible central fever   1/6  still febrile but normal wBC  Plan   - monitor - coitinue bactrim 12/29 x 10 days    HTN/HLD   1/6 - BP soft but adequate . NEarly started on neo last ngiht.   P: Continuous telemetry  SBP goal < 160bbut MAP > 65 -  DC FROM MAR:  Amlodipine, Lisinopril, and PRN Labetalol Remains on Crestor   Nutritional needs  1/6 - tolerating TF  P: Continue TF  Supplement protein   Chronic sacroiliitis Osteopenia RLS Supportive care   GOC 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  Anticipate severe long term  disability without chance of independent function.   Palliative care meeting is scheduled for Sat 16/24  when other family members are able to attend In interim, family has asked that medical updates be relayed to Theodoro Grist (son in law who is a physician).   Best Practice (right click and "Reselect all SmartList Selections" daily)   Diet/type: tubefeeds DVT prophylaxis: SCD GI prophylaxis: H2B Lines: N/A Foley:  Yes, and it is still needed + urecholine (d/w RN) Code Status:  full code Last date of multidisciplinary goals of care discussion Theodoro Grist (son in law) 1/3] - pall meet 12/15/22     ATTESTATION & SIGNATURE   The patient Pamela Johnston is critically ill with multiple organ systems failure and requires high complexity decision making for assessment and support, frequent evaluation and titration of therapies, application of advanced monitoring technologies and extensive interpretation of multiple databases and discussion with other appropriate health care personnel such as bedside nurses, social workers, case Production designer, theatre/television/film, consultants, respiratory therapists, nutritionists, secretaries etc.,  Critical care time includes but is not restricted to just documentation time. Documentation can happen in parallel or sequential to care time depending  on case mix urgency and priorities for the shift. So, overall critical Care Time devoted to patient care services described in this note is  40  Minutes.   This time reflects time of care of this signee Dr Brand Males which includ does not reflect procedure time, or teaching time or supervisory time of PA/NP/Med student/Med Resident etc but could involve care discussion time     Dr. Brand Males, M.D., [facility].C.P Pulmonary and Critical Care Medicine Medical Director - [facility] ICU Staff Physician, [facility] Pager: [phone], If no answer or between  15:00h - 7:00h: call [phone]  12/15/2022 10:28 AM    LABS    PULMONARY No results for input(s): "PHART", "PCO2ART", "PO2ART", "HCO3", "TCO2", "O2SAT" in the last 168 hours.  Invalid input(s): "PCO2", "PO2"  CBC Recent Labs  Lab 12/11/22 0511 12/13/22 0837 12/15/22 0438  HGB 11.8* 10.1* 9.4*  HCT 34.8* 30.9* 29.1*  WBC 12.4* 10.8* 10.8*  PLT 293 378 402*    COAGULATION No results for input(s): "INR" in the last 168 hours.  CARDIAC  No results for input(s): "TROPONINI" in the last 168 hours. No results for input(s): "PROBNP" in the last 168 hours.   CHEMISTRY Recent Labs  Lab 12/09/22 0251 12/10/22 0314 12/11/22 0511 12/15/22 0438  NA 146* 145 144 145  K 3.9 3.8 4.2 4.4  CL 112* 111 110 118*  CO2 23 21* 22 20*  GLUCOSE 127* 135* 123* 134*  BUN 38* 39* 36* 56*  CREATININE 0.87 0.82 0.69 0.99  CALCIUM 8.2* 7.9* 8.4* 8.4*   Estimated Creatinine Clearance: 38.3 mL/min (by C-G formula based on SCr of 0.99 mg/dL).   LIVER Recent Labs  Lab 12/15/22 0438  AST 62*  ALT 48*  ALKPHOS 42  BILITOT 0.4  PROT 5.2*  ALBUMIN 2.1*     INFECTIOUS No results for input(s): "LATICACIDVEN", "PROCALCITON" in the last 168 hours.   ENDOCRINE CBG (last 3)  Recent Labs    12/14/22 2314 12/15/22 0314 12/15/22 0814  GLUCAP 134* 135* 143*         IMAGING x48h  - image(s) personally visualized  -   highlighted in bold No results found.

## 2022-12-16 ENCOUNTER — Other Ambulatory Visit (HOSPITAL_COMMUNITY): Payer: Medicare PPO

## 2022-12-16 ENCOUNTER — Inpatient Hospital Stay (HOSPITAL_COMMUNITY): Payer: Medicare PPO

## 2022-12-16 DIAGNOSIS — J9601 Acute respiratory failure with hypoxia: Secondary | ICD-10-CM | POA: Diagnosis not present

## 2022-12-16 LAB — CBC
HCT: 30.6 % — ABNORMAL LOW (ref 36.0–46.0)
Hemoglobin: 9.4 g/dL — ABNORMAL LOW (ref 12.0–15.0)
MCH: 30.8 pg (ref 26.0–34.0)
MCHC: 30.7 g/dL (ref 30.0–36.0)
MCV: 100.3 fL — ABNORMAL HIGH (ref 80.0–100.0)
Platelets: 436 10*3/uL — ABNORMAL HIGH (ref 150–400)
RBC: 3.05 MIL/uL — ABNORMAL LOW (ref 3.87–5.11)
RDW: 13.2 % (ref 11.5–15.5)
WBC: 12.7 10*3/uL — ABNORMAL HIGH (ref 4.0–10.5)
nRBC: 0 % (ref 0.0–0.2)

## 2022-12-16 LAB — GLUCOSE, CAPILLARY
Glucose-Capillary: 144 mg/dL — ABNORMAL HIGH (ref 70–99)
Glucose-Capillary: 131 mg/dL — ABNORMAL HIGH (ref 70–99)
Glucose-Capillary: 125 mg/dL — ABNORMAL HIGH (ref 70–99)
Glucose-Capillary: 120 mg/dL — ABNORMAL HIGH (ref 70–99)
Glucose-Capillary: 150 mg/dL — ABNORMAL HIGH (ref 70–99)
Glucose-Capillary: 134 mg/dL — ABNORMAL HIGH (ref 70–99)

## 2022-12-16 LAB — COMPREHENSIVE METABOLIC PANEL
ALT: 57 U/L — ABNORMAL HIGH (ref 0–44)
AST: 74 U/L — ABNORMAL HIGH (ref 15–41)
Albumin: 2.1 g/dL — ABNORMAL LOW (ref 3.5–5.0)
Alkaline Phosphatase: 50 U/L (ref 38–126)
Anion gap: 12 (ref 5–15)
BUN: 43 mg/dL — ABNORMAL HIGH (ref 8–23)
CO2: 21 mmol/L — ABNORMAL LOW (ref 22–32)
Calcium: 8.4 mg/dL — ABNORMAL LOW (ref 8.9–10.3)
Chloride: 110 mmol/L (ref 98–111)
Creatinine, Ser: 0.77 mg/dL (ref 0.44–1.00)
GFR, Estimated: >60 mL/min (ref >60)
Glucose, Bld: 138 mg/dL — ABNORMAL HIGH (ref 70–99)
Potassium: 4.1 mmol/L (ref 3.5–5.1)
Sodium: 143 mmol/L (ref 135–145)
Total Bilirubin: 0.3 mg/dL (ref 0.3–1.2)
Total Protein: 5.3 g/dL — ABNORMAL LOW (ref 6.5–8.1)

## 2022-12-16 LAB — MAGNESIUM: Magnesium: 2.3 mg/dL (ref 1.7–2.4)

## 2022-12-16 LAB — PHOSPHORUS: Phosphorus: 2.9 mg/dL (ref 2.5–4.6)

## 2022-12-16 MED ORDER — FENTANYL CITRATE PF 50 MCG/ML IJ SOSY
PREFILLED_SYRINGE | INTRAMUSCULAR | Status: AC
  Filled 2022-12-16: qty 1

## 2022-12-16 MED ORDER — FENTANYL CITRATE PF 50 MCG/ML IJ SOSY
50.0000 ug | PREFILLED_SYRINGE | Freq: Once | INTRAMUSCULAR | Status: AC
  Administered 2022-12-16: 50 ug via INTRAVENOUS

## 2022-12-16 NOTE — Progress Notes (Addendum)
eLink Physician-Brief Progress Note Patient Name: Pamela Johnston DOB: 07-09-1941 MRN: [id number]   Date of Service  12/16/2022  HPI/Events of Note  Called for hypoxia. RN notes that over the last couple hours she has had desaturation to 80s. Currently on 100% fio2. Given an o2 boost and O2 sat is up to 98, was down to 83. HR in 120s. BP ok. Not on continuous sedation, was given a fentanyl and versed push before I was called. Appears asynchronous with vent. Peak pressures in 30s. No issues passing suction catheter down the ETT at this time per bedside . ? If this is sedation related   eICU Interventions  Stat CXR ordered stat Fentanyl 50 microgram bolus x 1 to see response      Intervention Category Major Interventions: Respiratory failure - evaluation and management  Delan Ksiazek G Kamarie Palma 12/16/2022, 7:16 PM  Addendum at 7:50 pm - Reviewed CXR. ETT is ok. No major infiltrates, no collapse, no PTX. Seen on camera again. Calm. 30%/peep 5 ---o2 sat 98. Continue as planned.   Addendum at 12:45 am - Despite multiple doses of fentanyl and versed. Patient tachypneic and uncomfortable on vent. Will start fentanyl drip - hopefully will not need large doses but otherwise very asynchronous with vent

## 2022-12-16 NOTE — Progress Notes (Signed)
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  Intubated, given hypertonic saline.  NSGY felt no intervention would be helpful.  Prognosis poor, goals of care discussions ongoing.  Pertinent  Medical History  Hypertension Sacroilititis Restless legs syndrome osteopenia  Significant Hospital Events: Including procedures, antibiotic start and stop dates in addition to other pertinent events   12/21 - Presented to [facility] ED via EMS as Code Stroke. LKW 2000. Abrupt onset of decreased responsiveness, aphasia and R hemiplegia. Stroke admitting, NSGY consulted. PCCM consult for vent management. Treated with unasyn for aspiration pneumonia CT Head with large L hemispheric parietal/temporal lobe IPH with extension into basal ganglia and IVH. HTS 3% started.   12/24 palliative care consulted, family requested meeting to be delayed 12/25 Family requested goals of care meeting to be delayed 12/26 Stopped unasyn, replete phos; [name] GOC conversation: family says full code 12/27 restarted unasyn for fever, RLL infiltrate; resp culture: enterobacter, MDR. Palliative consulted  12/28 CT head unchanged 12/29 unasyn changed to bactrim 12/31 posturing, CT head with increased shift, increased bleeding 1/2 Family fired CVA service after discussion RE poor prognosis  1/4 off sedation  1/5- No acute issues overnight .,  Remains on vent with increased respirations off sedation . Fever curve slightly decreased  1/6 - pall care meeting moved to 2pm today. [name] the son-in-law will be present. On ven  3-%/ On TF. Neo not started last night. MAP > 65 after fluids bolus.  fEbrile +.  Last CT head 12/09/22. Husband at bedside holding her hand. 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 ASked if pall care meeting today was a good opportunity to discuss " he said "yes". Pallaitive care meeting with Walden Field, CCM and family: Dauighter Ebony Hail, Son in law Eddie Dibbles -> they are trying to get guardianship for husband [facility]) -> full code, + anticipate LTAC/ Trach  Interim History / Subjective:    1/7 - gcs now 5, on neo - map 65. Full code. They want trach/peg/ltac  Objective   Blood pressure (!) 109/52, pulse 87, temperature 100.1 F (37.8 C), temperature source Axillary, resp. rate (!) 33, height 5\' 1"  (1.549 m), weight 59.7 kg, SpO2 94 %.    Vent Mode: PRVC FiO2 (%):  [30 %] 30 % Set Rate:  [16 bmp] 16 bmp Vt Set:  [380 mL] 380 mL PEEP:  [5 cmH20] 5 cmH20 Plateau Pressure:  [12 cmH20-20 cmH20] 20 cmH20   Intake/Output Summary (Last 24 hours) at 12/16/2022 1107 Last data filed at 12/16/2022 1000 Gross per 24 hour  Intake 2132.37 ml  Output 1350 ml  Net 782.37 ml   Filed Weights   12/07/22 0500 12/14/22 0500 12/15/22 0500  Weight: 60 kg 59.7 kg 59.7 kg    Examination: General Appearance:  Looks criticall ill FRAIL Head:  Normocephalic, without obvious abnormality, atraumatic Eyes:  PERRL - yes, conjunctiva/corneas - muddy     Ears:  Normal external ear canals, both ears Nose:  G tube -  CORTRAK Throat:  ETT TUBE - YES , OG tube - n Neck:  Supple,  No enlargement/tenderness/nodules Lungs: Clear to auscultation bilaterally, Ventilator   Synchrony - yes 30% Heart:  S1 and S2 normal, no murmur, CVP - no.  Pressors - NEO GTT Abdomen:  Soft, no masses, no organomegaly Genitalia / Rectal:  Not done Extremities:  Extremities- intact Skin:  ntact in exposed areas . Sacral  area - not examined Neurologic:  Sedation - none -> RASS - -5, GCS 5 pe RN .    RN   Resolved Hospital Problem list   Hypotension Hyperglycemia   Assessment & Plan:   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 with IVH and mass effect, vasogenic edema, midline shift, subfalcine herniation -- "brain compression"  R parietal lobe hemorrhage with associated edema  Severe Encephalopathy    1/7 - remains comatose. GCS 5 per RN  P: Continue supportive care Maintain neuro protective measures; goal for eurothermia, euglycemia, eunatermia, normoxia, and PCO2 goal of 35-40 Nutrition and bowel regiment  Seizure precautions  Aspirations precautions  Poor prognosis with unlikely meaningful neurologic recovery Continue GOC discussion  Unable to anticoagulate given bleed    Acute respiratory failure with hypoxia du eto large ICH 11/29/2022 and c/b Enterobacter PNA - -Culture positive 12/27 VAP   - 12/16/2022 - > does NOT meet criteria for SBT/Extubation in setting of Acute Respiratory Failure due to coma  P: Continue ventilator support with lung protective strategies  Wean PEEP and FiO2 for sats greater than 90%. Head of bed elevated 30 degrees. Plateau pressures less than 30 cm H20.  Follow intermittent chest x-ray and ABG.   SAT/SBT as tolerated, mentation preclude extubation  Ensure adequate pulmonary hygiene  Follow cultures  VAP bundle in place  PAD protocol   Enterobacter vAP 12/27 - on bactrim since 12/29 FEVER - Bromocriptine started 1/4 for management of possible central fever   1/6  still febrile but curve better but normal wBC  Plan   - monitor - coitinue bactrim 12/29 x 10 days    HTN/HLD  Circulatory shock - since 12/15/22  1/7 Now on neo gtt   P: Continuous telemetry  Continue neo gtt - SBP goal < 160 but MAP > 65 -  Hold home  Amlodipine, Lisinopril, and PRN Labetalol Remains on Crestor   Nutritional needs  1/7 -  tolerating TF  P: Continue TF  Supplement protein  Needs PEG  Chronic sacroiliitis Osteopenia RLS Supportive care   GOC 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  Anticipate severe long term disability without chance of independent function.   Pall meet 12/15/22-  full code   Best Practice (right click and "Reselect all SmartList Selections" daily)   Diet/type: tubefeeds DVT prophylaxis: SCD GI prophylaxis: H2B Lines: N/A Foley:  Yes, and it is still needed + urecholine (d/w RN) Code Status:  full code Last date of multidisciplinary goals of care discussion Theodoro Grist (son in law) 1/3] - pall meet 12/15/22 - full code.      ATTESTATION & SIGNATURE   The patient Pamela Johnston is critically ill with multiple organ systems failure and requires high complexity decision making for assessment and support, frequent evaluation and titration of therapies, application of advanced monitoring technologies and extensive interpretation of multiple databases and discussion with other appropriate health care personnel such as bedside nurses, social workers, case Production designer, theatre/television/film, consultants, respiratory therapists, nutritionists, secretaries etc.,  Critical care time  includes but is not restricted to just documentation time. Documentation can happen in parallel or sequential to care time depending on case mix urgency and priorities for the shift. So, overall critical Care Time devoted to patient care services described in this note is  25  Minutes.   This time reflects time of care of this signee Dr Kalman Shan which includ does not reflect procedure time, or teaching time or supervisory time of PA/NP/Med student/Med Resident etc but could involve care discussion time     Dr. Kalman Shan, M.D., [facility].C.P Pulmonary and Critical Care Medicine Medical Director - [facility] ICU Staff Physician, [facility] [place] Pulmonary and Critical  Care Pager: [phone], If no answer or between  15:00h - 7:00h: call [phone]  12/16/2022 11:32 AM    LABS    PULMONARY No results for input(s): "PHART", "PCO2ART", "PO2ART", "HCO3", "TCO2", "O2SAT" in the last 168 hours.  Invalid input(s): "PCO2", "PO2"  CBC Recent Labs  Lab 12/13/22 0837 12/15/22 0438 12/16/22 0405  HGB 10.1* 9.4* 9.4*  HCT 30.9* 29.1* 30.6*  WBC 10.8* 10.8* 12.7*  PLT 378 402* 436*    COAGULATION No results for input(s): "INR" in the last 168 hours.  CARDIAC  No results for input(s): "TROPONINI" in the last 168 hours. No results for input(s): "PROBNP" in the last 168 hours.   CHEMISTRY Recent Labs  Lab 12/10/22 0314 12/11/22 0511 12/15/22 0438 12/16/22 0405  NA 145 144 145 143  K 3.8 4.2 4.4 4.1  CL 111 110 118* 110  CO2 21* 22 20* 21*  GLUCOSE 135* 123* 134* 138*  BUN 39* 36* 56* 43*  CREATININE 0.82 0.69 0.99 0.77  CALCIUM 7.9* 8.4* 8.4* 8.4*  MG  --   --   --  2.3  PHOS  --   --   --  2.9   Estimated Creatinine Clearance: 47.3 mL/min (by C-G formula based on SCr of 0.77 mg/dL).   LIVER Recent Labs  Lab 12/15/22 0438 12/16/22 0405  AST 62* 74*  ALT 48* 57*  ALKPHOS 42 50  BILITOT 0.4 0.3  PROT 5.2* 5.3*  ALBUMIN 2.1* 2.1*     INFECTIOUS No results for input(s): "LATICACIDVEN", "PROCALCITON" in the last 168 hours.   ENDOCRINE CBG (last 3)  Recent Labs    12/15/22 2308 12/16/22 0309 12/16/22 0740  GLUCAP 133* 144* 131*         IMAGING x48h  - image(s) personally visualized  -   highlighted in bold DG CHEST PORT 1 VIEW  Result Date: 12/15/2022 CLINICAL DATA:  Endotracheal tube. EXAM: PORTABLE CHEST 1 VIEW COMPARISON:  Radiographs 12/09/2022 and 12/07/2022. FINDINGS: 0633 hours. Mild patient rotation to the right. The mandible overlies the right lung apex. The endotracheal and enteric tubes appear unchanged. The heart size and mediastinal contours are stable. Further improvement in bibasilar aeration  with residual retrocardiac opacity and a possible small left pleural effusion. No evidence of pneumothorax or acute osseous abnormality. IMPRESSION: Further improvement in bibasilar aeration. Stable support system. No acute findings. Electronically Signed   By: Carey Bullocks M.D.   On: 12/15/2022 10:11

## 2022-12-17 DIAGNOSIS — I611 Nontraumatic intracerebral hemorrhage in hemisphere, cortical: Secondary | ICD-10-CM | POA: Diagnosis not present

## 2022-12-17 DIAGNOSIS — T17908A Unspecified foreign body in respiratory tract, part unspecified causing other injury, initial encounter: Secondary | ICD-10-CM | POA: Diagnosis not present

## 2022-12-17 DIAGNOSIS — I619 Nontraumatic intracerebral hemorrhage, unspecified: Secondary | ICD-10-CM | POA: Diagnosis not present

## 2022-12-17 LAB — MAGNESIUM: Magnesium: 2.2 mg/dL (ref 1.7–2.4)

## 2022-12-17 LAB — COMPREHENSIVE METABOLIC PANEL
ALT: 51 U/L — ABNORMAL HIGH (ref 0–44)
AST: 62 U/L — ABNORMAL HIGH (ref 15–41)
Albumin: 1.9 g/dL — ABNORMAL LOW (ref 3.5–5.0)
Alkaline Phosphatase: 50 U/L (ref 38–126)
Anion gap: 8 (ref 5–15)
BUN: 33 mg/dL — ABNORMAL HIGH (ref 8–23)
CO2: 23 mmol/L (ref 22–32)
Calcium: 8.3 mg/dL — ABNORMAL LOW (ref 8.9–10.3)
Chloride: 110 mmol/L (ref 98–111)
Creatinine, Ser: 0.7 mg/dL (ref 0.44–1.00)
GFR, Estimated: >60 mL/min (ref >60)
Glucose, Bld: 116 mg/dL — ABNORMAL HIGH (ref 70–99)
Potassium: 4.3 mmol/L (ref 3.5–5.1)
Sodium: 141 mmol/L (ref 135–145)
Total Bilirubin: 0.4 mg/dL (ref 0.3–1.2)
Total Protein: 5.2 g/dL — ABNORMAL LOW (ref 6.5–8.1)

## 2022-12-17 LAB — GLUCOSE, CAPILLARY
Glucose-Capillary: 105 mg/dL — ABNORMAL HIGH (ref 70–99)
Glucose-Capillary: 92 mg/dL (ref 70–99)
Glucose-Capillary: 111 mg/dL — ABNORMAL HIGH (ref 70–99)
Glucose-Capillary: 115 mg/dL — ABNORMAL HIGH (ref 70–99)
Glucose-Capillary: 127 mg/dL — ABNORMAL HIGH (ref 70–99)
Glucose-Capillary: 123 mg/dL — ABNORMAL HIGH (ref 70–99)

## 2022-12-17 LAB — PHOSPHORUS: Phosphorus: 3.2 mg/dL (ref 2.5–4.6)

## 2022-12-17 LAB — CBC
HCT: 30.1 % — ABNORMAL LOW (ref 36.0–46.0)
Hemoglobin: 9.7 g/dL — ABNORMAL LOW (ref 12.0–15.0)
MCH: 31.8 pg (ref 26.0–34.0)
MCHC: 32.2 g/dL (ref 30.0–36.0)
MCV: 98.7 fL (ref 80.0–100.0)
Platelets: 423 10*3/uL — ABNORMAL HIGH (ref 150–400)
RBC: 3.05 MIL/uL — ABNORMAL LOW (ref 3.87–5.11)
RDW: 13 % (ref 11.5–15.5)
WBC: 12.1 10*3/uL — ABNORMAL HIGH (ref 4.0–10.5)
nRBC: 0 % (ref 0.0–0.2)

## 2022-12-17 LAB — PROTIME-INR
INR: 1.1 (ref 0.8–1.2)
Prothrombin Time: 14.1 seconds (ref 11.4–15.2)

## 2022-12-17 MED ORDER — POLYETHYLENE GLYCOL 3350 17 G PO PACK
17.0000 g | PACK | Freq: Every day | ORAL | Status: DC
  Administered 2022-12-20: 17 g
  Filled 2022-12-17 (×2): qty 1

## 2022-12-17 MED ORDER — FENTANYL BOLUS VIA INFUSION: 25.0000 ug | INTRAVENOUS | Status: DC | PRN

## 2022-12-17 MED ORDER — FENTANYL 2500MCG IN NS 250ML (10MCG/ML) PREMIX INFUSION
25.0000 ug/h | INTRAVENOUS | Status: DC
  Administered 2022-12-17: 25 ug/h via INTRAVENOUS
  Administered 2022-12-18 – 2022-12-19 (×3): 75 ug/h via INTRAVENOUS
  Administered 2022-12-21: 50 ug/h via INTRAVENOUS
  Filled 2022-12-17 (×3): qty 250

## 2022-12-17 MED ORDER — FENTANYL 2500MCG IN NS 250ML (10MCG/ML) PREMIX INFUSION
INTRAVENOUS | Status: AC
  Filled 2022-12-17: qty 250

## 2022-12-17 MED ORDER — DOCUSATE SODIUM 50 MG/5ML PO LIQD
100.0000 mg | Freq: Two times a day (BID) | ORAL | Status: DC
  Administered 2022-12-20: 100 mg
  Filled 2022-12-17 (×3): qty 10

## 2022-12-17 NOTE — Progress Notes (Signed)
Nutrition Follow-up  DOCUMENTATION CODES:   Not applicable  INTERVENTION:   Tube feeding via OG tube: Vital 1.2 at 55 ml/h (1320 ml per day)  Provides 1584 kcal, 99 gm protein, 1070 ml free water daily  NUTRITION DIAGNOSIS:   Inadequate oral intake related to inability to eat as evidenced by NPO status. Ongoing.   GOAL:   Provide needs based on ASPEN/SCCM guidelines Met with TF at goal   MONITOR:   Vent status, TF tolerance, Labs, Weight trends  REASON FOR ASSESSMENT:   Consult Enteral/tube feeding initiation and management  ASSESSMENT:   82 year old female with PMHx of HTN, osteopenia, RLS, sacroiliitis admitted with large left hemispheric parietal/temporal IPH with vasogenic edema/mass effect, also with acute respiratory failure and concern for aspiration PNA given emesis per-intubation.  Pt discussed during ICU rounds and with RN.  Plan for further [place] discussion with family due to catastrophic bleed.   Current wt: 59.7 kg  Admission wt: 57.8 kg   Medications reviewed and include: colace, miralax  Fentanyl  Neo-synephrine @ 60 mcg   Labs reviewed CBG's: 92-150  5F OG tube; tip in body of stomach   Diet Order:   Diet Order             Diet NPO time specified  Diet effective now                   EDUCATION NEEDS:   Not appropriate for education at this time  Skin:  Skin Assessment: Reviewed RN Assessment  Last BM:  400 ml via FMS  Height:   Ht Readings from Last 1 Encounters:  11/29/22 5\' 1"  (1.549 m)    Weight:   Wt Readings from Last 1 Encounters:  12/15/22 59.7 kg    Ideal Body Weight:  47.7 kg  BMI:  Body mass index is 24.87 kg/m.  Estimated Nutritional Needs:   Kcal:  1500-1700  Protein:  80-100 grams  Fluid:  1.5-1.7 L/day  Lockie Pares., RD, LDN, CNSC See AMiON for contact information

## 2022-12-17 NOTE — TOC Initial Note (Addendum)
Transition of Care [facility]) - Initial/Assessment Note    Patient Details  Name: Pamela Johnston MRN: [id number] Date of Birth: 1941/05/02  Transition of [facility]) CM/SW Contact:    Ella Bodo, RN Phone Number: 12/17/2022, 12:18 PM  Clinical Narrative:                 82 y/o female admitted on 12/21 with large L parietal, temporal acute intracerebral hemorrhage with extension in to basal ganglia and intrventricular hemorrhage with significant vasogenic edema and mass effect.  Received message from patient's daughter, Ebony Hail, requesting that [facility] speak with her husband, Shanon Brow, phone: [phone]. Lowella Fairy; he has a number of questions regarding petitioning for emergency guardianship for patient's spouse, as he has "memory issues." He also wants to obtain POA for patient.  Informed son in law that all guardianship issues would be handled in the court system, and that family would have to petition Janeece Riggers of Court for guardianship.  At this time, he would not be able to obtain POA, as patient not competent to make the decision. He also asked questions about transfer to [facility] or SNF, but goals of care are continuing to be discussed.  Uncertain if patient would be eligible for LTAC admission, as she would likely not progress. He mentioned applying for Medicaid, and I explained the process for this to him.   Shanon Brow states that patient's husband has deferred decision making to him and his wife, though this is not documented in the chart. Noted patient's husband has been excluded from goals of care meetings, though seems to be capable to understand and communicate patient's wishes.  Patient's husband at bedside, states he plans to meet with his daughter and son in law to discuss goals of care over lunch.  He is appropriate, and asking appropriate questions about what happens when "life support is removed."  Recommend inclusion of husband in decision making, as he is Copy, unless family has  documentation stating that he does not have capacity.      Barriers to Discharge: Continued Medical Work up              Expected Discharge Plan and Services   Discharge Planning Services: CM Consult   Living arrangements for the past 2 months: [place]                                      Prior Living Arrangements/Services Living arrangements for the past 2 months: Single Family Home Lives with:: Spouse                             Emotional Assessment   Attitude/Demeanor/Rapport: Unable to Assess Affect (typically observed): Unable to Assess        Admission diagnosis:  ICH (intracerebral hemorrhage) ([place]) [I61.9] Hypoxia [R09.02] Intraparenchymal hemorrhage of brain ([place]) [I61.9] Aspiration into airway, initial encounter [T17.908A] Patient Active Problem List   Diagnosis Date Noted   Pneumonia due to Enterobacter aerogenes ([place]) 12/12/2022   Goals of care, counseling/discussion 12/12/2022   Brain compression ([place]) 12/11/2022   Acute respiratory failure with hypoxia ([place]) 12/11/2022   Aspiration into airway 11/30/2022   Hypoxia 11/30/2022   Intraparenchymal hemorrhage of brain ([place]) 11/30/2022   ICH (intracerebral hemorrhage) ([place]) 11/29/2022   RLS (restless legs syndrome) 11/29/2022   Osteopenia 11/29/2022   Sacroiliitis ([place]) 11/29/2022  Benign essential HTN 11/29/2022   PCP:  Cecil Cobbs, MD Pharmacy:   [facility] [id number] - [place], [place] - [address] [place] [address] Phone: [phone] Fax: [phone]     Social Determinants of Health (SDOH) Social History:   SDOH Interventions:     Readmission Risk Interventions     No data to display         Quintella Baton, RN, BSN  Trauma/Neuro ICU Case Manager [phone]

## 2022-12-17 NOTE — Progress Notes (Signed)
NAME:  Pamela Johnston, MRN:  [id number], DOB:  1941-08-04, LOS: 18 ADMISSION DATE:  11/29/2022, CONSULTATION DATE:  11/29/2022 REFERRING MD:  Amada Jupiter - Neuro REASON FOR CONSULT: Ventilator management in setting of large IPH  History of Present Illness:  81-yer-old woman who presented to [facility] ED 12/21 as a Code Stroke. LKW 2000 when patient reportedly was in bed and became less responsive with +aphasia and R-sided weakness. One episode of nausea/vomiting en route with EMS. PMHx significant for HTN, chronic sacroiliitis (managed with Percocet), RLS, osteopenia.   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 Labs were grossly unremarkable with WBC mildly elevated to 12 and mild hyperglycemia to 154. Decision was made to intubate patient in ED after several episodes of emesis with concern for aspiration. HTS 3% was initiated.   Stroke service to admit. NSGY consulted with no plan for intervention at this time.   PCCM consulted for ventilator/hemodynamic management.   Pertinent Medical History:  Hypertension Sacroilititis Restless legs syndrome osteopenia  Significant Hospital Events: Including procedures, antibiotic start and stop dates in addition to other pertinent events   12/21 - Presented to [facility] ED via EMS as Code Stroke. LKW 2000. Abrupt onset of decreased responsiveness, aphasia and R hemiplegia. Stroke admitting, NSGY consulted. PCCM consult for vent management. Treated with unasyn for aspiration pneumonia. CT Head with large L hemispheric parietal/temporal lobe IPH with extension into basal ganglia and IVH. HTS 3% started.   12/24 palliative care consulted, family requested meeting to be delayed 12/25 Family requested goals of care meeting to be delayed 12/26 Stopped unasyn,  replete phos; [name] GOC conversation: family says full code 12/27 restarted unasyn for fever, RLL infiltrate; resp culture: enterobacter, MDR. Palliative consulted  12/28 CT head unchanged 12/29 unasyn changed to bactrim 12/31 posturing, CT head with increased shift, increased bleeding 1/2 Family fired CVA service after discussion RE poor prognosis  1/4 off sedation  1/5- No acute issues overnight .,  Remains on vent with increased respirations off sedation . Fever curve slightly decreased  1/6 - pall care meeting moved to 2pm today. Theodoro Grist the son-in-law will be present. On ven 3-%/ On TF. Neo not started last night. MAP > 65 after fluids bolus. Febrile +.  Last CT head 12/09/22.  1/8 - No significant neurologic/clinical change. Brief GOC discussion with husband, Harvie Heck, at bedside in AM. Concerns from staff re: patient GOC/decisionmaking, want to ensure husband is included in decisions/information sharing. Attempted bedside meeting but family prematurely ended meeting due to escalated discussion.  Interim History / Subjective:  No significant clinical or neurologic changes Neurologic exam remains the same Increased dyssynchrony on vent 1/7-1/8, Fentanyl increased Concern from staff re: GOC discussions Attempted to meet at bedside with husband, daughter and son-in-law (see separate note)  Objective:   Blood pressure (!) 102/53, pulse 76, temperature 98.3 F (36.8 C), temperature source Oral, resp. rate (!) 21, height 5\' 1"  (1.549 m), weight 59.7 kg, SpO2 92 %.    Vent Mode: PSV;CPAP FiO2 (%):  [30 %] 30 % Set Rate:  [16 bmp] 16 bmp Vt Set:  [380 mL] 380 mL PEEP:  [5 cmH20] 5 cmH20 Pressure Support:  [10 cmH20] 10 cmH20 Plateau Pressure:  [15 cmH20-28 cmH20] 15 cmH20   Intake/Output Summary (Last 24 hours) at 12/17/2022 0800 Last data  filed at 12/17/2022 0600 Gross per 24 hour  Intake 1273.4 ml  Output 1050 ml  Net 223.4 ml    Filed Weights   12/07/22 0500 12/14/22 0500 12/15/22  0500  Weight: 60 kg 59.7 kg 59.7 kg   Physical Examination: General: Critically ill-appearing elderly woman in NAD. Intubated, sedated on vent. HEENT: [place]/AT, anicteric sclera, pupils equal, round, 38mm, sluggishly reactive, moist mucous membranes. ETT in place. Neuro: Comatose. Does not respond to verbal, tactile or noxious stimuli. Some forward shoulder-rolling to pain. No purposeful movement noted. Not following commands.+Corneal and +Cough  CV: RRR, no m/g/r. PULM: Breathing even and unlabored on vent (PEEP 5, FiO2 30%). Lung fields CTAB. GI: Soft, nontender, mildly distended. Hypoactive bowel sounds. Extremities: Bilateral symmetric trace LE edema noted. Skin: Warm/dry, no rashes.  Resolved Hospital Problem List:  Hypotension Hyperglycemia   Assessment & Plan:   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 Brain compression ICH L parietal and temporal lobe extending to basal ganglia with IVH and mass effect, vasogenic edema, midline shift, subfalcine herniation -- "brain compression"  R parietal lobe hemorrhage with associated edema  Severe Encephalopathy - Critical care primary - Stroke team no longer following per family request - Seizure/aspiration precautions - Neuroprotective measures: HOB > 30 degrees, normoglycemia, normothermia, electrolytes WNL - Very poor prognosis; unlikely meaningful neurologic recovery; this has been conveyed to family on multiple occasions and through multiple physicians - Ongoing [facility] discussions, please see separate note of same date - PMT following  Acute respiratory failure with hypoxia du eto large ICH c/b Enterobacter PNA Culture positive 12/27 VAP. - Continue full vent support (4-8cc/kg IBW) - Wean FiO2 for O2 sat > 90% - Daily WUA/SBT as mental status toleratesl; currently not appropriate due to comatose state - VAP bundle - Pulmonary hygiene - PAD protocol for sedation: Fentanyl for goal RASS  0 to -1, limiting as able - Follow CXR, ABG intermittently  Enterobacter VAP 12/27 - on Bactrim since 12/29 Fever, low grade - Bromocriptine started 1/4 for management of possible central fever  - Trend WBC, fever curve - Continue Bactrim x 10-day course (12/29 -  end 1/8)  HTN/HLD  Circulatory shock - since 12/15/22 - Goal MAP > 65 (SBP < 160 in the setting of bleed) - Neo titrated to goal MAP - Trend WBC, fever curve - F/u Cx data - Continue broad-spectrum antibiotics as above - Hold home antihypertensives - Continue statin - Cardiac monitoring   Nutritional needs - Nutrition/RD following, appreciate recs - Continue TF - Protein supplement - Would eventually need PEG if family chooses to continue aggressive care; do not feel this is ethical or appropriate in the setting of catastrophic ICH  Chronic sacroiliitis Osteopenia RLS - Supportive care  GOC 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  Anticipate severe long term disability without chance of independent function.  - PMT following, appreciate assistance with management - Family continues to desire full code status (last PMT meeting 1/6, please see note) - Ongoing GOC discussions with husband, daughter and son-in-law - Do not feel tat pursuing trach/PEG would be ethically appropriate, given catastrophic ICH  Best Practice (right click and "Reselect all SmartList Selections" daily)   Diet/type: tubefeeds DVT prophylaxis: SCD GI prophylaxis: H2B Lines: N/A Foley:  Yes, and it is still needed + urecholine (d/w RN) Code Status:  full code Last date of multidisciplinary goals of care discussion [name] (son in law) 1/3] - pall meet 12/15/22 - full  code.   Critical care time: 45 minutes   The patient is critically ill with multiple organ system failure and requires high complexity decision making for assessment and support, frequent evaluation and titration of therapies, advanced  monitoring, review of radiographic studies and interpretation of complex data.   Critical Care Time devoted to patient care services, exclusive of separately billable procedures, described in this note is 45 minutes.  Lestine Mount, PA-C [facility] 12/17/22 8:01 AM  Please see [email or web address] for pager details.  From 7A-7P if no response, please call [phone] After hours, please call ELink [phone]

## 2022-12-18 DIAGNOSIS — I619 Nontraumatic intracerebral hemorrhage, unspecified: Secondary | ICD-10-CM | POA: Diagnosis not present

## 2022-12-18 DIAGNOSIS — I611 Nontraumatic intracerebral hemorrhage in hemisphere, cortical: Secondary | ICD-10-CM | POA: Diagnosis not present

## 2022-12-18 DIAGNOSIS — T17908A Unspecified foreign body in respiratory tract, part unspecified causing other injury, initial encounter: Secondary | ICD-10-CM | POA: Diagnosis not present

## 2022-12-18 LAB — COMPREHENSIVE METABOLIC PANEL
ALT: 46 U/L — ABNORMAL HIGH (ref 0–44)
AST: 56 U/L — ABNORMAL HIGH (ref 15–41)
Albumin: 1.7 g/dL — ABNORMAL LOW (ref 3.5–5.0)
Alkaline Phosphatase: 54 U/L (ref 38–126)
Anion gap: 7 (ref 5–15)
BUN: 29 mg/dL — ABNORMAL HIGH (ref 8–23)
CO2: 23 mmol/L (ref 22–32)
Calcium: 7.8 mg/dL — ABNORMAL LOW (ref 8.9–10.3)
Chloride: 109 mmol/L (ref 98–111)
Creatinine, Ser: 0.63 mg/dL (ref 0.44–1.00)
GFR, Estimated: >60 mL/min (ref >60)
Glucose, Bld: 102 mg/dL — ABNORMAL HIGH (ref 70–99)
Potassium: 3.9 mmol/L (ref 3.5–5.1)
Sodium: 139 mmol/L (ref 135–145)
Total Bilirubin: 0.4 mg/dL (ref 0.3–1.2)
Total Protein: 4.7 g/dL — ABNORMAL LOW (ref 6.5–8.1)

## 2022-12-18 LAB — CBC
HCT: 28.3 % — ABNORMAL LOW (ref 36.0–46.0)
Hemoglobin: 8.9 g/dL — ABNORMAL LOW (ref 12.0–15.0)
MCH: 31.2 pg (ref 26.0–34.0)
MCHC: 31.4 g/dL (ref 30.0–36.0)
MCV: 99.3 fL (ref 80.0–100.0)
Platelets: 359 10*3/uL (ref 150–400)
RBC: 2.85 MIL/uL — ABNORMAL LOW (ref 3.87–5.11)
RDW: 12.8 % (ref 11.5–15.5)
WBC: 10.4 10*3/uL (ref 4.0–10.5)
nRBC: 0 % (ref 0.0–0.2)

## 2022-12-18 LAB — GLUCOSE, CAPILLARY
Glucose-Capillary: 116 mg/dL — ABNORMAL HIGH (ref 70–99)
Glucose-Capillary: 115 mg/dL — ABNORMAL HIGH (ref 70–99)
Glucose-Capillary: 114 mg/dL — ABNORMAL HIGH (ref 70–99)
Glucose-Capillary: 108 mg/dL — ABNORMAL HIGH (ref 70–99)
Glucose-Capillary: 113 mg/dL — ABNORMAL HIGH (ref 70–99)
Glucose-Capillary: 117 mg/dL — ABNORMAL HIGH (ref 70–99)

## 2022-12-18 LAB — PHOSPHORUS: Phosphorus: 2.6 mg/dL (ref 2.5–4.6)

## 2022-12-18 LAB — MAGNESIUM: Magnesium: 1.9 mg/dL (ref 1.7–2.4)

## 2022-12-18 NOTE — Progress Notes (Signed)
NAME:  TORI DATTILIO, MRN:  [id number], DOB:  02/02/1941, LOS: 29 ADMISSION DATE:  11/29/2022, CONSULTATION DATE:  11/29/2022 REFERRING MD:  Leonel Ramsay - Neuro REASON FOR CONSULT: Ventilator management in setting of large IPH  History of Present Illness:  81-yer-old woman who presented to [facility] ED 12/21 as a Code Stroke. LKW 2000 when patient reportedly was in bed and became less responsive with +aphasia and R-sided weakness. One episode of nausea/vomiting en route with EMS. PMHx significant for HTN, chronic sacroiliitis (managed with Percocet), RLS, osteopenia.   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 Labs were grossly unremarkable with WBC mildly elevated to 12 and mild hyperglycemia to 154. Decision was made to intubate patient in ED after several episodes of emesis with concern for aspiration. HTS 3% was initiated.   Stroke service to admit. NSGY consulted with no plan for intervention at this time.   PCCM consulted for ventilator/hemodynamic management.   Pertinent Medical History:  Hypertension Sacroilititis Restless legs syndrome Osteopenia  Significant Hospital Events: Including procedures, antibiotic start and stop dates in addition to other pertinent events   12/21 - Presented to [facility] ED via EMS as Code Stroke. LKW 2000. Abrupt onset of decreased responsiveness, aphasia and R hemiplegia. Stroke admitting, NSGY consulted. PCCM consult for vent management. Treated with unasyn for aspiration pneumonia. CT Head with large L hemispheric parietal/temporal lobe IPH with extension into basal ganglia and IVH. HTS 3% started.   12/24 - Palliative care consulted, family requested meeting to be delayed 12/25 - Family requested goals of care meeting to be delayed 12/26 - Stopped  Unasyn, replete phos; [name] GOC conversation: family says full code 12/27 - Restarted Unasyn for fever, RLL infiltrate; resp culture: enterobacter, MDR. Palliative consulted  12/28 - CT Head unchanged 12/29 - Unasyn changed to bactrim 12/31 - Posturing, CT head with increased shift, increased bleeding 1/2 - Family fired CVA service after discussion RE poor prognosis  1/4 - Off sedation  1/5 - No acute issues overnight. Remains on vent with increased respirations off sedation. Fever curve slightly decreased  1/6 - Pall care meeting moved to 2pm today. [name] the son-in-law will be present. On ven 30%/ On TF. Neo not started last night. MAP > 65 after fluids bolus. Febrile +.  Last CT head 12/09/22.  1/8 - No significant neurologic/clinical change. Brief GOC discussion with husband, [name], at bedside in AM. Concerns from staff re: patient GOC/decisionmaking, want to ensure husband is included in decisions/information sharing. Attempted bedside meeting but family prematurely ended meeting due to escalated discussion. 1/9 - Febrile overnight to 101.70F, remains on bromocriptine. No significant neurologic exam changes.  Interim History / Subjective:  Febrile to 101.70F overnight No changes in mental status/neuro exam Remains on bromocriptine, WBC downtrending Husband at bedside, no other family present  Objective:   Blood pressure (!) 113/52, pulse 71, temperature (!) 100.5 F (38.1 C), temperature source Axillary, resp. rate 20, height 5\' 1"  (1.549 m), weight 59.7 kg, SpO2 96 %.    Vent Mode: PRVC FiO2 (%):  [30 %] 30 % Set Rate:  [16 bmp] 16 bmp Vt Set:  [380 mL] 380 mL PEEP:  [5 cmH20] 5 cmH20 Pressure Support:  [10 cmH20] 10 cmH20 Plateau Pressure:  [11 cmH20-15 cmH20] 14 cmH20   Intake/Output Summary (Last 24  hours) at 12/18/2022 0727 Last data filed at 12/18/2022 0600 Gross per 24 hour  Intake 1886.09 ml  Output 1400 ml  Net 486.09 ml    Filed Weights   12/07/22 0500 12/14/22 0500  12/15/22 0500  Weight: 60 kg 59.7 kg 59.7 kg   Physical Examination: General: Acute-on-chronically ill-appearing elderly woman in NAD. Intubated, mildly sedated on vent. HEENT: [place]/AT, anicteric sclera, pupils equal, round (47mm), sluggishly reactive, moist mucous membranes. ETT/OGT in place. Neuro: Comatose. Does not respond to verbal, tactile or noxious stimuli. Not following commands. No spontaneous movement of extremities noted. +Corneal, +Cough, and +Gag  CV: RRR, no m/g/r. PULM: Breathing even and unlabored on vent (PEEP 5, FiO2 30%). Lung fields diminished throughout. GI: Soft, nontender, mildly distended. Hypoactive bowel sounds. Extremities: Bilateral symmetric 1+ nonpitting LE edema noted. Bilateral dependent hand edema noted. Skin: Warm/dry, no rashes.  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 This has been conveyed to the family on multiple occasions and via multiple physicians - Ongoing GOC discussions, PMT following  Acute respiratory failure with hypoxia due to large ICH c/b Enterobacter PNA Culture positive 12/27 VAP. - Continue full vent support (4-8cc/kg IBW) - Wean FiO2 for O2 sat > 90% - Daily WUA/SBT as mental status tolerates; currently not appropriate due to comatose state - VAP bundle - Pulmonary hygiene - PAD protocol for  sedation: Fentanyl for goal RASS 0 to -1; primarily needing for vent dyssynchrony, limiting sedating medications as able - Intermittent CXR  Enterobacter VAP 12/27 - on Bactrim since 12/29 Fever, low grade - Bromocriptine started 1/4 for management of possible central fever  - Trend WBC, fever curve - S/p Bactrim x 10-day course (end 1/8)  HTN/HLD  Circulatory shock - since 12/15/22 - Goal MAP > 65 (SBP < 160 in the setting of ICH) - Neo titrated to goal MAP - Trend WBC, fever curve - F/u Cx data - Completed broad spectrum antibiotics - Hold home antihypertensives - Continue statin - Cardiac monitoring  Nutritional needs - Nutrition/RD following, appreciate recs - Continue TF - Protein supplement - Would eventually need PEG if family wishes to continue aggressive care; do not feel this is ethical or appropriate in the setting of catastrophic ICH  Chronic sacroiliitis Osteopenia RLS - Supportive care  GOC 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  Anticipate severe long term disability without chance of independent function.  - PMT following, appreciate assistance with management - Family continues to desire full code status/full scope of care - Last PMT meeting 1/6 - Ongoing GOC discussions with husband, daughter, son-in-law - Do not feel that pursuing trach/PEG would be appropriate, given catastrophic ICH - CMO involvement to help facilitate multidisciplinary GOC discussions  Best Practice (right click and "Reselect all SmartList Selections" daily)   Diet/type: tubefeeds DVT prophylaxis: SCD GI prophylaxis: H2B Lines: N/A Foley:  Yes, and it is still needed + urecholine (d/w RN) Code Status:  full code Last date of multidisciplinary goals of  care discussion Theodoro Grist (son in law) 1/3] - pall meet 12/15/22 - full code.   Critical care time: 35 minutes   The patient is critically ill with multiple organ system failure and requires  high complexity decision making for assessment and support, frequent evaluation and titration of therapies, advanced monitoring, review of radiographic studies and interpretation of complex data.   Critical Care Time devoted to patient care services, exclusive of separately billable procedures, described in this note is 35 minutes.  Tim Lair, PA-C [facility] 12/18/22 7:27 AM  Please see [email or web address] for pager details.  From 7A-7P if no response, please call [phone] After hours, please call ELink [phone]

## 2022-12-18 NOTE — TOC Progression Note (Signed)
Transition of Care [facility]) - Progression Note    Patient Details  Name: Pamela Johnston MRN: [id number] Date of Birth: 1941/07/15  Transition of [facility]) CM/SW Contact  Oren Section Cleta Alberts, RN Phone Number: 12/18/2022, 4:00pm  Clinical Narrative:    Meeting held with patient's family, [place], TOC Case Manager and bedside nurse. Patient's husband [name], daughter Bryson Ha, son in law Shanon Brow, Dr. Judeth Horn, CMO/[facility], Ellan Lambert, RN, BSN, [facility] Case Manager and Montez Hageman, RN, bedside nurse all present.   [name] introduced himself and all those present.  He spoke directly with patient's husband, inquiring what husband's expectations are for his wife while hospitalized.  Husband stated that he wants his wife to have "the best medical care possible", and that he would like decisions to be made as a family.  Husband went on to say that their family is a faith-based family, and believe that prayers and faith can change outcomes.  [name] asked husband who he would like medical information to be communicated to, and husband identified his son in law, [name].   Chrys Racer was able to give a synopsis of patient's progress throughout hospital stay, and current condition.   Daughter questioned when tracheostomy and PEG placement would be performed, and she was made aware that a consult would be placed and physician/providers would intervene.  Family appreciative of meeting with [name].   Will follow patient progress and assist/offer support as needed.    Reinaldo Raddle, RN, BSN  Trauma/Neuro ICU Case Manager [phone]      Barriers to Discharge: Continued Medical Work up  Expected Discharge Plan and Services   Discharge Planning Services: CM Consult   Living arrangements for the past 2 months: Single Family Home                                       Social Determinants of Health (SDOH) Interventions    Readmission Risk Interventions     No data to display          Reinaldo Raddle, RN, BSN  Trauma/Neuro ICU Case Manager [phone]

## 2022-12-18 NOTE — TOC CAGE-AID Note (Signed)
Transition of Care [facility]) - CAGE-AID Screening   Patient Details  Name: ALTA GODING MRN: [id number] Date of Birth: [birth year]  Transition [facility]) CM/SW Contact:    Army Melia, RN Phone Number:[phone] 12/18/2022, 11:33 PM    CAGE-AID Screening: Substance Abuse Screening unable to be completed due to: : Patient unable to participate (intubated/unresponsive)

## 2022-12-18 NOTE — Progress Notes (Signed)
     Referral previously received for Christella Hartigan for goals of care discussion. Multiple discussions had by PMT. Chart reviewed and updates received from care team.  I received a phone call from the patient's son Algis Liming.  He expressed concerns about the care team yesterday and today.  He was asking about their schedule and I recommended he call the nursing unit as they would have more insight onto the PCCM schedule.  He also inquired about scheduling of the trach and PEG procedures.  I informed him that palliative medicine is generally not responsible for the scheduling of these procedures as it depends on provider availability and these are generally scheduled by the critical care team.  I recommended he reach out again to the nursing unit to discuss this.  I answered all questions to the best of my ability.  PMT will follow from a distance as goals are clear.  However, we are available for clinical change or any new palliative needs.  Thank you for your referral and allowing PMT to assist in [place] care.   Walden Field, NP Palliative Medicine Team Phone: [phone]  NO CHARGE

## 2022-12-19 DIAGNOSIS — I619 Nontraumatic intracerebral hemorrhage, unspecified: Secondary | ICD-10-CM | POA: Diagnosis not present

## 2022-12-19 DIAGNOSIS — I611 Nontraumatic intracerebral hemorrhage in hemisphere, cortical: Secondary | ICD-10-CM | POA: Diagnosis not present

## 2022-12-19 DIAGNOSIS — Z7189 Other specified counseling: Secondary | ICD-10-CM | POA: Diagnosis not present

## 2022-12-19 DIAGNOSIS — Z515 Encounter for palliative care: Secondary | ICD-10-CM | POA: Diagnosis not present

## 2022-12-19 LAB — CBC
HCT: 30.6 % — ABNORMAL LOW (ref 36.0–46.0)
Hemoglobin: 9.5 g/dL — ABNORMAL LOW (ref 12.0–15.0)
MCH: 30.8 pg (ref 26.0–34.0)
MCHC: 31 g/dL (ref 30.0–36.0)
MCV: 99.4 fL (ref 80.0–100.0)
Platelets: 367 10*3/uL (ref 150–400)
RBC: 3.08 MIL/uL — ABNORMAL LOW (ref 3.87–5.11)
RDW: 12.8 % (ref 11.5–15.5)
WBC: 10.7 10*3/uL — ABNORMAL HIGH (ref 4.0–10.5)
nRBC: 0 % (ref 0.0–0.2)

## 2022-12-19 LAB — BASIC METABOLIC PANEL
Anion gap: 6 (ref 5–15)
BUN: 22 mg/dL (ref 8–23)
CO2: 23 mmol/L (ref 22–32)
Calcium: 7.9 mg/dL — ABNORMAL LOW (ref 8.9–10.3)
Chloride: 108 mmol/L (ref 98–111)
Creatinine, Ser: 0.47 mg/dL (ref 0.44–1.00)
GFR, Estimated: >60 mL/min (ref >60)
Glucose, Bld: 114 mg/dL — ABNORMAL HIGH (ref 70–99)
Potassium: 3.8 mmol/L (ref 3.5–5.1)
Sodium: 137 mmol/L (ref 135–145)

## 2022-12-19 LAB — GLUCOSE, CAPILLARY
Glucose-Capillary: 129 mg/dL — ABNORMAL HIGH (ref 70–99)
Glucose-Capillary: 102 mg/dL — ABNORMAL HIGH (ref 70–99)
Glucose-Capillary: 104 mg/dL — ABNORMAL HIGH (ref 70–99)
Glucose-Capillary: 100 mg/dL — ABNORMAL HIGH (ref 70–99)
Glucose-Capillary: 107 mg/dL — ABNORMAL HIGH (ref 70–99)
Glucose-Capillary: 134 mg/dL — ABNORMAL HIGH (ref 70–99)

## 2022-12-19 LAB — MAGNESIUM: Magnesium: 2 mg/dL (ref 1.7–2.4)

## 2022-12-19 LAB — PHOSPHORUS: Phosphorus: 2.7 mg/dL (ref 2.5–4.6)

## 2022-12-19 NOTE — Consult Note (Signed)
Reason for Consult/Chief Complaint: prolonged mechanical ventilation, dysphagia Consultant: Denese Killings, MD  Pamela Johnston is an 82 y.o. female.   HPI: 22F with prolonged mechanical ventilation after catastrophic ICH and grim prognosis.   Past Medical History:  Diagnosis Date   Benign essential HTN 11/29/2022   Osteopenia 11/29/2022   RLS (restless legs syndrome) 11/29/2022   Sacroiliitis (HCC) 11/29/2022    History reviewed. No pertinent surgical history.  No family history on file.  Social History:  has no history on file for tobacco use, alcohol use, and drug use.  Allergies:  Allergies  Allergen Reactions   Nickel Other (See Comments)    Verified by Allergist   Penicillin G Other (See Comments)    Unknown reaction Tolerated Unasyn 11/2022.  TDD.    Medications: I have reviewed the patient's current medications.  Results for orders placed or performed during the hospital encounter of 11/29/22 (from the past 48 hour(s))  Glucose, capillary     Status: Abnormal   Collection Time: 12/17/22  7:21 PM  Result Value Ref Range   Glucose-Capillary 127 (H) 70 - 99 mg/dL    Comment: Glucose reference range applies only to samples taken after fasting for at least 8 hours.  Glucose, capillary     Status: Abnormal   Collection Time: 12/17/22 11:19 PM  Result Value Ref Range   Glucose-Capillary 123 (H) 70 - 99 mg/dL    Comment: Glucose reference range applies only to samples taken after fasting for at least 8 hours.  Glucose, capillary     Status: Abnormal   Collection Time: 12/18/22  3:23 AM  Result Value Ref Range   Glucose-Capillary 116 (H) 70 - 99 mg/dL    Comment: Glucose reference range applies only to samples taken after fasting for at least 8 hours.  CBC     Status: Abnormal   Collection Time: 12/18/22  6:02 AM  Result Value Ref Range   WBC 10.4 4.0 - 10.5 K/uL   RBC 2.85 (L) 3.87 - 5.11 MIL/uL   Hemoglobin 8.9 (L) 12.0 - 15.0 g/dL   HCT 69.6 (L) 78.9 - 38.1 %    MCV 99.3 80.0 - 100.0 fL   MCH 31.2 26.0 - 34.0 pg   MCHC 31.4 30.0 - 36.0 g/dL   RDW 01.7 51.0 - 25.8 %   Platelets 359 150 - 400 K/uL   nRBC 0.0 0.0 - 0.2 %    Comment: Performed at [facility], [address], [place], [place] [address]  Magnesium     Status: None   Collection Time: 12/18/22  6:02 AM  Result Value Ref Range   Magnesium 1.9 1.7 - 2.4 mg/dL    Comment: Performed at [facility], [address] [place], [place] [address]  Phosphorus     Status: None   Collection Time: 12/18/22  6:02 AM  Result Value Ref Range   Phosphorus 2.6 2.5 - 4.6 mg/dL    Comment: Performed at [facility], [address], [place], [place] [address]  Comprehensive metabolic panel     Status: Abnormal   Collection Time: 12/18/22  6:02 AM  Result Value Ref Range   Sodium 139 135 - 145 mmol/L   Potassium 3.9 3.5 - 5.1 mmol/L   Chloride 109 98 - 111 mmol/L   CO2 23 22 - 32 mmol/L   Glucose, Bld 102 (H) 70 - 99 mg/dL    Comment: Glucose reference range applies only  to samples taken after fasting for at least 8 hours.   BUN 29 (H) 8 - 23 mg/dL   Creatinine, Ser 6.19 0.44 - 1.00 mg/dL   Calcium 7.8 (L) 8.9 - 10.3 mg/dL   Total Protein 4.7 (L) 6.5 - 8.1 g/dL   Albumin 1.7 (L) 3.5 - 5.0 g/dL   AST 56 (H) 15 - 41 U/L   ALT 46 (H) 0 - 44 U/L   Alkaline Phosphatase 54 38 - 126 U/L   Total Bilirubin 0.4 0.3 - 1.2 mg/dL   GFR, Estimated >50 >93 mL/min    Comment: (NOTE) Calculated using the CKD-EPI Creatinine Equation (2021)    Anion gap 7 5 - 15    Comment: Performed at [facility], [address] [place], [place] [address]  Glucose, capillary     Status: Abnormal   Collection Time: 12/18/22  7:26 AM  Result Value Ref Range   Glucose-Capillary 115 (H) 70 - 99 mg/dL    Comment: Glucose reference range applies only to samples taken after fasting for at least 8 hours.  Glucose, capillary     Status: Abnormal   Collection Time: 12/18/22 12:21 PM  Result Value Ref Range    Glucose-Capillary 114 (H) 70 - 99 mg/dL    Comment: Glucose reference range applies only to samples taken after fasting for at least 8 hours.  Glucose, capillary     Status: Abnormal   Collection Time: 12/18/22  4:41 PM  Result Value Ref Range   Glucose-Capillary 108 (H) 70 - 99 mg/dL    Comment: Glucose reference range applies only to samples taken after fasting for at least 8 hours.  Glucose, capillary     Status: Abnormal   Collection Time: 12/18/22  7:26 PM  Result Value Ref Range   Glucose-Capillary 113 (H) 70 - 99 mg/dL    Comment: Glucose reference range applies only to samples taken after fasting for at least 8 hours.  Glucose, capillary     Status: Abnormal   Collection Time: 12/18/22 11:23 PM  Result Value Ref Range   Glucose-Capillary 117 (H) 70 - 99 mg/dL    Comment: Glucose reference range applies only to samples taken after fasting for at least 8 hours.  CBC     Status: Abnormal   Collection Time: 12/19/22  2:07 AM  Result Value Ref Range   WBC 10.7 (H) 4.0 - 10.5 K/uL   RBC 3.08 (L) 3.87 - 5.11 MIL/uL   Hemoglobin 9.5 (L) 12.0 - 15.0 g/dL   HCT 45.8 (L) 09.9 - 83.3 %   MCV 99.4 80.0 - 100.0 fL   MCH 30.8 26.0 - 34.0 pg   MCHC 31.0 30.0 - 36.0 g/dL   RDW 82.5 05.3 - 97.6 %   Platelets 367 150 - 400 K/uL   nRBC 0.0 0.0 - 0.2 %    Comment: Performed at [facility], [address] [place], [place] [address]  Basic metabolic panel     Status: Abnormal   Collection Time: 12/19/22  2:07 AM  Result Value Ref Range   Sodium 137 135 - 145 mmol/L   Potassium 3.8 3.5 - 5.1 mmol/L   Chloride 108 98 - 111 mmol/L   CO2 23 22 - 32 mmol/L   Glucose, Bld 114 (H) 70 - 99 mg/dL    Comment: Glucose reference range applies only to samples taken after fasting for at least 8 hours.   BUN 22 8 - 23 mg/dL  Creatinine, Ser 0.47 0.44 - 1.00 mg/dL   Calcium 7.9 (L) 8.9 - 10.3 mg/dL   GFR, Estimated >60 >60 mL/min    Comment: (NOTE) Calculated using the CKD-EPI Creatinine  Equation (2021)    Anion gap 6 5 - 15    Comment: Performed at [facility] [address], [place], [place] [address]  Magnesium     Status: None   Collection Time: 12/19/22  2:07 AM  Result Value Ref Range   Magnesium 2.0 1.7 - 2.4 mg/dL    Comment: Performed at [facility], [address], [place], [place] [address]  Phosphorus     Status: None   Collection Time: 12/19/22  2:07 AM  Result Value Ref Range   Phosphorus 2.7 2.5 - 4.6 mg/dL    Comment: Performed at [facility] [address], [place], [place] [address]  Glucose, capillary     Status: Abnormal   Collection Time: 12/19/22  3:19 AM  Result Value Ref Range   Glucose-Capillary 129 (H) 70 - 99 mg/dL    Comment: Glucose reference range applies only to samples taken after fasting for at least 8 hours.  Glucose, capillary     Status: Abnormal   Collection Time: 12/19/22  7:41 AM  Result Value Ref Range   Glucose-Capillary 102 (H) 70 - 99 mg/dL    Comment: Glucose reference range applies only to samples taken after fasting for at least 8 hours.  Glucose, capillary     Status: Abnormal   Collection Time: 12/19/22 12:18 PM  Result Value Ref Range   Glucose-Capillary 104 (H) 70 - 99 mg/dL    Comment: Glucose reference range applies only to samples taken after fasting for at least 8 hours.  Glucose, capillary     Status: Abnormal   Collection Time: 12/19/22  4:50 PM  Result Value Ref Range   Glucose-Capillary 100 (H) 70 - 99 mg/dL    Comment: Glucose reference range applies only to samples taken after fasting for at least 8 hours.    No results found.  ROS 10 point review of systems is negative except as listed above in HPI.   Physical Exam Blood pressure (!) 117/50, pulse 64, temperature 99.3 F (37.4 C), temperature source Oral, resp. rate 16, height 5\' 1"  (1.549 m), weight 59.7 kg, SpO2 100 %. Constitutional: well-developed, well-nourished HEENT: pupils equal, round, reactive to light, 61mm b/l,  moist conjunctiva, external inspection of ears and nose normal, hearing unable to be assessed Oropharynx: normal oropharyngeal mucosa, poor dentition Neck: no thyromegaly, trachea midline, unable to assess midline cervical tenderness to palpation Chest: breath sounds equal bilaterally Abdomen: soft, NT, no bruising, no hepatosplenomegaly GU: normal female genitalia  Back: no wounds, unable to assess thoracic/lumbar spine tenderness to palpation Rectal: deferred Extremities: 2+ radial and pedal pulses bilaterally, unable to assess motor and sensation bilateral UE and LE, no peripheral edema MSK: unable to assess gait/station, no clubbing/cyanosis of fingers/toes Skin: warm, dry, no rashes Psych:  comatose, GCS3      Assessment/Plan:  Catastrophic ICH  Prolonged mechanical ventilation and dysphagia - primary team has recommended against aggressive medical interventions, to include trach/PEG, due to medical futility and I agree with this assessment, however family is insistent on pursuing all possible interventions. Expected LTAC placement. Discussion held with patient's husband, daughter, and son-in-law. Patient's husband relinquishes all decision-making rights to the patient's son-in-law, [name]. Patient's husband understands this is a decision that will be permanent  for the duration of this hospitalization. Decision made to proceed with trach/PEG. Informed consent was obtained after detailed explanation of risks, including bleeding, infection, injury to nearby structures, malposition, dislodgement, hypoxia/anoxia, and need for conversion to open procedure. All questions answered to the their satisfaction. Procedure planned for 1/12. NPO at midnight on 1/12.    Jesusita Oka, MD General and [facility]

## 2022-12-19 NOTE — Progress Notes (Signed)
I just received a call from patient's daughter, Bryson Ha, and son-in-law, [name] regarding updates on patient. I informed them that there had been no new changes since the last time they were spoken to by medical staff. I was asked by [name] if the is currently a full code. I responded "yes, she is a full code and has been since she was switched from a DNR at the beginning of her hospital stay". [name] then said "just so you know, she was never supposed to have been a DNR. There are many things written wrong in the chart". I confirmed again that the patient is a full code and has been as long as I have had the patient on and off since 12/02/22. I relayed this information to the CCM providers and Mauricio Po Civil engineer, contracting) and Leverne Humbles (AD).  Montez Hageman RN

## 2022-12-19 NOTE — Progress Notes (Signed)
NAME:  Pamela Johnston, MRN:  [id number], DOB:  [birth year], LOS: 20 ADMISSION DATE:  11/29/2022, CONSULTATION DATE:  11/29/2022 REFERRING MD:  Amada Jupiter - Neuro REASON FOR CONSULT: Ventilator management in setting of large IPH  History of Present Illness:  81-yer-old woman who presented to [facility] ED 12/21 as a Code Stroke. LKW 2000 when patient reportedly was in bed and became less responsive with +aphasia and R-sided weakness. One episode of nausea/vomiting en route with EMS. PMHx significant for HTN, chronic sacroiliitis (managed with Percocet), RLS, osteopenia.   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 Labs were grossly unremarkable with WBC mildly elevated to 12 and mild hyperglycemia to 154. Decision was made to intubate patient in ED after several episodes of emesis with concern for aspiration. HTS 3% was initiated.   Stroke service to admit. NSGY consulted with no plan for intervention at this time.   PCCM consulted for ventilator/hemodynamic management.   Pertinent Medical History:  Hypertension Sacroilititis Restless legs syndrome Osteopenia  Significant Hospital Events: Including procedures, antibiotic start and stop dates in addition to other pertinent events   12/21 - Presented to [facility] ED via EMS as Code Stroke. LKW 2000. Abrupt onset of decreased responsiveness, aphasia and R hemiplegia. Stroke admitting, NSGY consulted. PCCM consult for vent management. Treated with unasyn for aspiration pneumonia. CT Head with large L hemispheric parietal/temporal lobe IPH with extension into basal ganglia and IVH. HTS 3% started.   12/24 - Palliative care consulted, family requested meeting to be delayed 12/25 - Family requested goals of care meeting to be delayed 12/26 - Stopped  Unasyn, replete phos; [name] GOC conversation: family says full code 12/27 - Restarted Unasyn for fever, RLL infiltrate; resp culture: enterobacter, MDR. Palliative consulted  12/28 - CT Head unchanged 12/29 - Unasyn changed to bactrim 12/31 - Posturing, CT head with increased shift, increased bleeding 1/2 - Family fired CVA service after discussion RE poor prognosis  1/4 - Off sedation  1/5 - No acute issues overnight. Remains on vent with increased respirations off sedation. Fever curve slightly decreased  1/6 - Pall care meeting moved to 2pm today. Theodoro Grist the son-in-law will be present. On ven 30%/ On TF. Neo not started last night. MAP > 65 after fluids bolus. Febrile +.  Last CT head 12/09/22.  1/8 - No significant neurologic/clinical change. Brief GOC discussion with husband, Harvie Heck, at bedside in AM. Concerns from staff re: patient GOC/decisionmaking, want to ensure husband is included in decisions/information sharing. Attempted bedside meeting but family prematurely ended meeting due to escalated discussion. 1/9 - Febrile overnight to 101.76F, remains on bromocriptine. No significant neurologic exam changes. 1/10 Family meeting yesterday with Dr. Lindie Spruce, plan for trach/peg though husband says they are still discussing this morning  Interim History / Subjective:  No overnight events   Objective:   Blood pressure (!) 117/51, pulse 67, temperature 99.9 F (37.7 C), temperature source Axillary, resp. rate 20, height 5\' 1"  (1.549 m), weight 59.7 kg, SpO2 100 %.    Vent Mode: PRVC FiO2 (%):  [30 %] 30 % Set Rate:  [16 bmp] 16 bmp Vt Set:  [330 mL-380 mL] 380 mL PEEP:  [5 cmH20] 5 cmH20 Pressure Support:  [8 cmH20] 8 cmH20 Plateau Pressure:  [12 cmH20-14 cmH20] 12 cmH20   Intake/Output Summary (Last  24 hours) at 12/19/2022 3220 Last data filed at 12/19/2022 0800 Gross per 24 hour  Intake 2310.39 ml  Output 1450 ml  Net 860.39 ml    Filed Weights   12/07/22 0500 12/14/22 0500 12/15/22  0500  Weight: 60 kg 59.7 kg 59.7 kg     General:  ill-appearing elderly F intubated and sedated HEENT: MM pink/moist, sclera anicteric, pupils equal  Neuro: unresponsive on Fentanyl, is triggering vent CV: s1s2 rrr, no m/r/g PULM:  good air movement bilaterally on full vent support GI: soft, bsx4 active  Extremities: warm/dry, 1+ edema  Skin: no rashes or lesions    Resolved Hospital Problem List:  Hypotension Hyperglycemia   Assessment & Plan:   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 Brain compression ICH R parietal lobe hemorrhage with associated edema  Severe Encephalopathy L parietal and temporal lobe extending to basal ganglia with IVH and mass effect, vasogenic edema, midline shift, subfalcine herniation -- "brain compression"   - Critical care primary after Stroke team signed off (per family request) -family meeting with hospital administrators resulted in decision to pursue trach/peg, however this morning husband says they will continue to discuss today -surgery was consulted - Seizure/aspiration precautions - Neuroprotective measures: HOB > 30 degrees, normoglycemia, normothermia, electrolytes WNL - Very poor prognosis; unlikely meaningful neurologic recovery. This has been conveyed to the family on multiple occasions and via multiple physicians - Ongoing [address] discussions, PMT following  Acute respiratory failure with hypoxia due to large ICH c/b Enterobacter PNA Culture positive 12/27 VAP. -Maintain full vent support with SAT/SBT as tolerated -titrate Vent setting to maintain SpO2 greater than or equal to 90%. -HOB elevated 30 degrees. -Plateau pressures less than 30 cm H20.  -Follow chest x-ray, ABG prn.   -Bronchial hygiene and RT/bronchodilator protocol.     Fever, low grade  Bromocriptine started 1/4 for management of possible central fever  - Trend WBC, fever curve - S/p Bactrim x 10-day course (end  1/8)  HTN/HLD  Circulatory shock - since 12/15/22 - Goal MAP > 65 (SBP < 160 in the setting of ICH) - Neo titrated to goal MAP >65 - Trend WBC, fever curve - F/u Cx data - Completed broad spectrum antibiotics - Hold home antihypertensives - Continue statin - Cardiac monitoring  Nutritional needs - Nutrition/RD following, appreciate recs - Continue TF - Protein supplement - Will follow up with family regarding trach/peg  Chronic sacroiliitis Osteopenia RLS - Supportive care  GOC 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  Anticipate severe long term disability without chance of independent function.  - PMT following, appreciate assistance with management - Family continues to desire full code status/full scope of care - Last PMT meeting 1/9 with CMO, continue full code and aggressive care  Best Practice (right click and "Reselect all SmartList Selections" daily)   Diet/type: tubefeeds DVT prophylaxis: SCD GI prophylaxis: H2B Lines: N/A Foley:  Yes, and it is still needed + urecholine (d/w RN) Code Status:  full code Last date of multidisciplinary goals of care discussion [name] (son in law) 1/3] - pall meet 12/15/22 - full code.  Meeting with Doreen Salvage CMO 1/9  Critical care time: 32 minutes   The patient is critically ill with multiple organ system failure and requires high complexity decision making for assessment and support, frequent evaluation and titration of therapies, advanced monitoring, review of radiographic studies and interpretation of complex data.   Critical Care Time devoted to patient care  services, exclusive of separately billable procedures, described in this note is 35 minutes. Otilio Carpen Avayah Raffety, PA-C [facility] See Amion for pager If no response to pager , please call [phone] until 7pm After 7:00 pm call Elink  [phone]?[place]

## 2022-12-19 NOTE — Written Directive (Signed)
In the meeting with the family yesterday including the care manager, bedside nurse, Pamela Johnston (patient's husband), Pamela Johnston (Patient's daughter), and Pamela Johnston (Patient's son-in-law) I clearly understood that the husband Pamela Johnston) wanted clinical decisions to be made by the son-in-law, Pamela Johnston, also known as Pamela Johnston.  Pamela Johnston also stated that he would prefer for information to be given to him through [place] or his daughter, but this does not excluded caregivers from directly talking with the husband Pamela Johnston).  I have spoken with the Care Manager directly to make sure that she understood my interpretation of  this conversation.  So moving forward, Pamela Johnston is the designated medical decision maker for Pamela Johnston.    Kathryne Eriksson. Dahlia Bailiff, MD, [place] [phone] [email or web address]

## 2022-12-19 NOTE — Progress Notes (Signed)
Spoke with pt's decision maker [name], confimed plan is to move forward with trach and peg.   Otilio Carpen Erhard Senske, PA-C

## 2022-12-19 NOTE — Progress Notes (Signed)
During my 0800 assessment, I noticed that the patient's tube feed was not turned off at midnight for possible trach and PEG today. I immediately turned off the tube feed and placed OG on LIWS. Dr Lynetta Mare notified.  Montez Hageman RN

## 2022-12-20 DIAGNOSIS — I619 Nontraumatic intracerebral hemorrhage, unspecified: Secondary | ICD-10-CM | POA: Diagnosis not present

## 2022-12-20 LAB — GLUCOSE, CAPILLARY
Glucose-Capillary: 106 mg/dL — ABNORMAL HIGH (ref 70–99)
Glucose-Capillary: 118 mg/dL — ABNORMAL HIGH (ref 70–99)
Glucose-Capillary: 138 mg/dL — ABNORMAL HIGH (ref 70–99)
Glucose-Capillary: 130 mg/dL — ABNORMAL HIGH (ref 70–99)
Glucose-Capillary: 142 mg/dL — ABNORMAL HIGH (ref 70–99)
Glucose-Capillary: 157 mg/dL — ABNORMAL HIGH (ref 70–99)

## 2022-12-20 NOTE — TOC Progression Note (Signed)
Transition of Care [facility]) - Progression Note    Patient Details  Name: Pamela Johnston MRN: [id number] Date of Birth: [birth year]  Transition of Care [facility] CM/SW Contact  Oren Section Cleta Alberts, RN Phone Number: 12/20/2022, 12:15pm  Clinical Narrative:    Planning tracheostomy and PEG placement tomorrow with Dr. Bobbye Morton.  Met with son in law, [name]; we discussed potential barriers to placement to trach/PEG to include: SNFs require trach to be 77 days old prior to admission to facility.  He understands that [facility]) may not be an option should patient not be progressing neurologically.  Explained that if patient fails to wean from the ventilator, we may have to look for a ventilator SNF, and there are few options in [place] for vent SNF.  He was made aware that there are three vent SNFs in [place], including one in [place], one in [place], and one in [place].  He understands that patient placement will all be dependent on patient progress.  [name] verbalized understanding of information and was appreciative.       Barriers to Discharge: Continued Medical Work up  Expected Discharge Plan and Services   Discharge Planning Services: CM Consult   Living arrangements for the past 2 months: Single Family Home                                       Social Determinants of Health (SDOH) Interventions    Readmission Risk Interventions     No data to display         Reinaldo Raddle, RN, BSN  Trauma/Neuro ICU Case Manager [phone]

## 2022-12-20 NOTE — Progress Notes (Signed)
NAME:  Pamela Johnston, MRN:  [id number], DOB:  12/05/1941, LOS: 21 ADMISSION DATE:  11/29/2022, CONSULTATION DATE:  11/29/2022 REFERRING MD:  Amada Jupiter - Neuro REASON FOR CONSULT: Ventilator management in setting of large IPH  History of Present Illness:  81-yer-old woman who presented to [facility] ED 12/21 as a Code Stroke. LKW 2000 when patient reportedly was in bed and became less responsive with +aphasia and R-sided weakness. One episode of nausea/vomiting en route with EMS. PMHx significant for HTN, chronic sacroiliitis (managed with Percocet), RLS, osteopenia.   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 Labs were grossly unremarkable with WBC mildly elevated to 12 and mild hyperglycemia to 154. Decision was made to intubate patient in ED after several episodes of emesis with concern for aspiration. HTS 3% was initiated.   Stroke service to admit. NSGY consulted with no plan for intervention at this time.   PCCM consulted for ventilator/hemodynamic management.   Pertinent Medical History:  Hypertension Sacroilititis Restless legs syndrome Osteopenia  Significant Hospital Events: Including procedures, antibiotic start and stop dates in addition to other pertinent events   12/21 - Presented to [facility] ED via EMS as Code Stroke. LKW 2000. Abrupt onset of decreased responsiveness, aphasia and R hemiplegia. Stroke admitting, NSGY consulted. PCCM consult for vent management. Treated with unasyn for aspiration pneumonia. CT Head with large L hemispheric parietal/temporal lobe IPH with extension into basal ganglia and IVH. HTS 3% started.   12/24 - Palliative care consulted, family requested meeting to be delayed 12/25 - Family requested goals of care meeting to be delayed 12/26 - Stopped  Unasyn, replete phos; [name] GOC conversation: family says full code 12/27 - Restarted Unasyn for fever, RLL infiltrate; resp culture: enterobacter, MDR. Palliative consulted  12/28 - CT Head unchanged 12/29 - Unasyn changed to bactrim 12/31 - Posturing, CT head with increased shift, increased bleeding 1/2 - Family fired CVA service after discussion RE poor prognosis  1/4 - Off sedation  1/5 - No acute issues overnight. Remains on vent with increased respirations off sedation. Fever curve slightly decreased  1/6 - Pall care meeting moved to 2pm today. Theodoro Grist the son-in-law will be present. On ven 30%/ On TF. Neo not started last night. MAP > 65 after fluids bolus. Febrile +.  Last CT head 12/09/22.  1/8 - No significant neurologic/clinical change. Brief GOC discussion with husband, Harvie Heck, at bedside in AM. Concerns from staff re: patient GOC/decisionmaking, want to ensure husband is included in decisions/information sharing. Attempted bedside meeting but family prematurely ended meeting due to escalated discussion. 1/9 - Febrile overnight to 101.69F, remains on bromocriptine. No significant neurologic exam changes. 1/10 Family meeting yesterday with Dr. Lindie Spruce, plan for trach/peg though husband says they are still discussing this morning 1/11 no new issues, plan for PEG tomorrow  Interim History / Subjective:  No overnight events   Objective:   Blood pressure (!) 116/44, pulse 90, temperature 98 F (36.7 C), temperature source Axillary, resp. rate 16, height 5\' 1"  (1.549 m), weight 58.5 kg, SpO2 99 %.    Vent Mode: PRVC FiO2 (%):  [30 %] 30 % Set Rate:  [16 bmp] 16 bmp Vt Set:  [380 mL] 380 mL PEEP:  [5 cmH20] 5 cmH20 Plateau Pressure:  [8 cmH20-13 cmH20] 12 cmH20   Intake/Output Summary (Last  24 hours) at 12/20/2022 0744 Last data filed at 12/20/2022 0700 Gross per 24 hour  Intake 2179.3 ml  Output 1275 ml  Net 904.3 ml    Filed Weights   12/14/22 0500 12/15/22 0500 12/20/22 0400   Weight: 59.7 kg 59.7 kg 58.5 kg     General:  ill-appearing elderly F intubated and unresponsive  HEENT: MM pink/moist, sclera anicteric, pupils equal, ETT in place Neuro: unresponsive off, is triggering vent CV: s1s2 rrr, no m/r/g PULM:  good air movement bilaterally on full vent support GI: soft, bsx4 active  Extremities: warm/dry, 1+ edema  Skin: no rashes or lesions    Resolved Hospital Problem List:  Hypotension Hyperglycemia   Assessment & Plan:   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 Brain compression ICH R parietal lobe hemorrhage with associated edema  Severe Encephalopathy L parietal and temporal lobe extending to basal ganglia with IVH and mass effect, vasogenic edema, midline shift, subfalcine herniation  - Critical care primary after Stroke team signed off (per family request) -multiple family meetings completed including with CMO, son in law [name] has been designated medical decision-maker -surgery planning trach/peg 1/12 - Seizure/aspiration precautions - Neuroprotective measures: HOB > 30 degrees, normoglycemia, normothermia,  - Very poor prognosis; unlikely meaningful neurologic recovery. This has been conveyed to the family on multiple occasions and via multiple physicians - PMT following  Acute respiratory failure with hypoxia due to large ICH c/b Enterobacter PNA Culture positive 12/27 VAP. Completed 10 day course of Bactrim -Maintain full vent support with SAT/SBT as tolerated -titrate Vent setting to maintain SpO2 greater than or equal to 90%. -HOB elevated 30 degrees. -Plateau pressures less than 30 cm H20.  -Follow chest x-ray, ABG prn.   -Bronchial hygiene and RT/bronchodilator protocol.    Fever, low grade  Bromocriptine started 1/4 for management of possible central fever  - Trend WBC, fever curve - S/p Bactrim x 10-day course (end 1/8)   HTN/HLD  Circulatory shock - since  12/15/22 - Goal MAP > 65 (SBP < 160 in the setting of ICH) - Neo titrated to goal MAP >65 - Trend WBC, fever curve - F/u Cx data - Completed broad spectrum antibiotics - Hold home antihypertensives - Continue statin - Cardiac monitoring  Nutritional needs - Nutrition/RD following, appreciate recs - Continue TF - Protein supplement - Will follow up with family regarding trach/peg  Chronic sacroiliitis Osteopenia RLS - Supportive care  GOC 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  Anticipate severe long term disability without chance of independent function.  - PMT following, appreciate assistance with management - Family continues to desire full code status/full scope of care - Last PMT meeting 1/9 with CMO, continue full code and aggressive care  Best Practice (right click and "Reselect all SmartList Selections" daily)   Diet/type: tubefeeds DVT prophylaxis: SCD GI prophylaxis: H2B Lines: N/A Foley:  Yes, and it is still needed + urecholine (d/w RN) Code Status:  full code Last date of multidisciplinary goals of care discussion [name] (son in law) 1/3] - pall meet 12/15/22 - full code.  Meeting with Doreen Salvage CMO 1/9  Critical care time: 32 minutes   The patient is critically ill with multiple organ system failure and requires high complexity decision making for assessment and support, frequent evaluation and titration of therapies, advanced monitoring, review of radiographic studies and interpretation of complex data.   Critical Care Time devoted to patient care services, exclusive of separately  billable procedures, described in this note is 35 minutes. Otilio Carpen Jazleen Robeck, PA-C [place] [facility] See Amion for pager If no response to pager , please call [phone] until 7pm After 7:00 pm call Elink  [phone]?[place]

## 2022-12-21 ENCOUNTER — Inpatient Hospital Stay (HOSPITAL_COMMUNITY): Payer: Medicare PPO

## 2022-12-21 DIAGNOSIS — R609 Edema, unspecified: Secondary | ICD-10-CM | POA: Diagnosis not present

## 2022-12-21 DIAGNOSIS — I619 Nontraumatic intracerebral hemorrhage, unspecified: Secondary | ICD-10-CM | POA: Diagnosis not present

## 2022-12-21 LAB — GLUCOSE, CAPILLARY
Glucose-Capillary: 118 mg/dL — ABNORMAL HIGH (ref 70–99)
Glucose-Capillary: 103 mg/dL — ABNORMAL HIGH (ref 70–99)
Glucose-Capillary: 88 mg/dL (ref 70–99)
Glucose-Capillary: 95 mg/dL (ref 70–99)
Glucose-Capillary: 92 mg/dL (ref 70–99)
Glucose-Capillary: 117 mg/dL — ABNORMAL HIGH (ref 70–99)

## 2022-12-21 MED ORDER — MIDAZOLAM HCL 2 MG/2ML IJ SOLN
INTRAMUSCULAR | Status: AC
  Administered 2022-12-21: 2 mg
  Filled 2022-12-21: qty 2

## 2022-12-21 MED ORDER — ROCURONIUM BROMIDE 10 MG/ML (PF) SYRINGE
PREFILLED_SYRINGE | INTRAVENOUS | Status: AC
  Administered 2022-12-21: 100 mg
  Filled 2022-12-21: qty 10

## 2022-12-21 MED ORDER — MIDAZOLAM HCL 2 MG/2ML IJ SOLN: 4.0000 mg | Freq: Once | INTRAMUSCULAR | Status: DC

## 2022-12-21 MED ORDER — ROCURONIUM BROMIDE 50 MG/5ML IV SOLN: 100.0000 mg | Freq: Once | INTRAVENOUS | Status: DC

## 2022-12-21 NOTE — Progress Notes (Signed)
NAME:  Pamela Johnston, MRN:  [id number], DOB:  05/25/1941, LOS: 2 ADMISSION DATE:  11/29/2022, CONSULTATION DATE:  11/29/2022 REFERRING MD:  Leonel Ramsay - Neuro REASON FOR CONSULT: Ventilator management in setting of large IPH  History of Present Illness:  81-yer-old woman who presented to [facility] ED 12/21 as a Code Stroke. LKW 2000 when patient reportedly was in bed and became less responsive with +aphasia and R-sided weakness. One episode of nausea/vomiting en route with EMS. PMHx significant for HTN, chronic sacroiliitis (managed with Percocet), RLS, osteopenia.   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 Labs were grossly unremarkable with WBC mildly elevated to 12 and mild hyperglycemia to 154. Decision was made to intubate patient in ED after several episodes of emesis with concern for aspiration. HTS 3% was initiated.   Stroke service to admit. NSGY consulted with no plan for intervention at this time.   PCCM consulted for ventilator/hemodynamic management.   Pertinent Medical History:  Hypertension Sacroilititis Restless legs syndrome Osteopenia  Significant Hospital Events: Including procedures, antibiotic start and stop dates in addition to other pertinent events   12/21 - Presented to [facility] ED via EMS as Code Stroke. LKW 2000. Abrupt onset of decreased responsiveness, aphasia and R hemiplegia. Stroke admitting, NSGY consulted. PCCM consult for vent management. Treated with unasyn for aspiration pneumonia. CT Head with large L hemispheric parietal/temporal lobe IPH with extension into basal ganglia and IVH. HTS 3% started.   12/24 - Palliative care consulted, family requested meeting to be delayed 12/25 - Family requested goals of care meeting to be delayed 12/26 - Stopped  Unasyn, replete phos; [name] GOC conversation: family says full code 12/27 - Restarted Unasyn for fever, RLL infiltrate; resp culture: enterobacter, MDR. Palliative consulted  12/28 - CT Head unchanged 12/29 - Unasyn changed to bactrim 12/31 - Posturing, CT head with increased shift, increased bleeding 1/2 - Family fired CVA service after discussion RE poor prognosis  1/4 - Off sedation  1/5 - No acute issues overnight. Remains on vent with increased respirations off sedation. Fever curve slightly decreased  1/6 - Pall care meeting moved to 2pm today. [name] the son-in-law will be present. On ven 30%/ On TF. Neo not started last night. MAP > 65 after fluids bolus. Febrile +.  Last CT head 12/09/22.  1/8 - No significant neurologic/clinical change. Brief GOC discussion with husband, [name], at bedside in AM. Concerns from staff re: patient GOC/decisionmaking, want to ensure husband is included in decisions/information sharing. Attempted bedside meeting but family prematurely ended meeting due to escalated discussion. 1/9 - Febrile overnight to 101.74F, remains on bromocriptine. No significant neurologic exam changes. 1/10 Family meeting yesterday with [name], plan for trach/peg though husband says they are still discussing this morning 1/11 no new issues, plan for PEG tomorrow 1/12 for trach/peg today  Interim History / Subjective:  No changes in clinical status  Objective:   Blood pressure 124/64, pulse 66, temperature 98.3 F (36.8 C), temperature source Axillary, resp. rate 20, height 5\' 1"  (1.549 m), weight 58.5 kg, SpO2 100 %.    Vent Mode: PRVC FiO2 (%):  [30 %] 30 % Set Rate:  [16 bmp] 16 bmp Vt Set:  [380 mL-580 mL] 380 mL PEEP:  [5 cmH20] 5 cmH20 Plateau Pressure:  [13 cmH20] 13 cmH20  Intake/Output Summary (Last 24 hours) at 12/21/2022 2956 Last data filed at 12/21/2022 0600 Gross per 24 hour  Intake 2225.64 ml  Output 1160 ml  Net 1065.64 ml    Filed Weights   12/14/22  0500 12/15/22 0500 12/20/22 0400  Weight: 59.7 kg 59.7 kg 58.5 kg     General:  ill-appearing elderly F intubated and unresponsive HEENT: MM pink/moist, sclera anicteric, pupils equal, ETT in place Neuro: withdraws slightly to pain, pupils equal, occasionally triggering vent  CV: s1s2 rrr, no m/r/g PULM:  good air movement bilaterally on full vent support GI: soft, bsx4 active  Extremities: warm/dry, 1+ edema  Skin: no rashes or lesions    Resolved Hospital Problem List:  Hypotension Hyperglycemia   Assessment & Plan:   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 Brain compression ICH R parietal lobe hemorrhage with associated edema  Severe Encephalopathy L parietal and 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 This has been conveyed to the family on multiple occasions and via multiple physicians - PMT following  Acute respiratory failure with hypoxia due to large ICH c/b Enterobacter PNA Culture positive 12/27 VAP. Completed 10 day course of Bactrim -Maintain full vent support with SAT/SBT as tolerated -titrate Vent setting to maintain SpO2 greater than or equal to 90%. -HOB elevated 30 degrees. -Plateau pressures less than 30 cm H20.  -Follow chest x-ray, ABG prn.   -Bronchial hygiene and RT/bronchodilator protocol.    Fever, low grade  Bromocriptine started 1/4 for management of possible central fever  - Trend WBC, fever curve - S/p Bactrim  x 10-day course (end 1/8) -check LE dopplers  HTN/HLD  Circulatory shock - since 12/15/22 - Goal MAP > 65 (SBP < 160 in the setting of ICH) - Neo titrated to goal MAP >65 - Trend WBC, fever curve - F/u Cx data - Completed broad spectrum antibiotics - Hold home antihypertensives - Continue statin - Cardiac monitoring  Nutritional needs - Nutrition/RD following, appreciate recs - Continue TF - Protein supplement   Chronic sacroiliitis Osteopenia RLS - Supportive care  GOC 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  Anticipate severe long term disability without chance of independent function.  - PMT following, appreciate assistance with management - Family continues to desire full code status/full scope of care - Last PMT meeting 1/9 with CMO, continue full code and aggressive care  Best Practice (right click and "Reselect all SmartList Selections" daily)   Diet/type: tubefeeds DVT prophylaxis: SCD GI prophylaxis: H2B Lines: N/A Foley:  Yes, and it is still needed + urecholine (d/w RN) Code Status:  full code Last date of multidisciplinary goals of care discussion [name] (son in law) 1/3] - pall meet 12/15/22 - full code.  Meeting with Doreen Salvage CMO 1/9  Critical care time: 30 minutes   The patient is critically ill with multiple organ system failure and requires high complexity decision making for assessment and support, frequent evaluation and titration of therapies, advanced monitoring, review of radiographic studies and interpretation of complex data.   Critical Care Time devoted to patient care services, exclusive  of separately billable procedures, described in this note is 35 minutes. Darcella Gasman Onie Hayashi, PA-C [facility] See Amion for pager If no response to pager , please call [phone] until 7pm After 7:00 pm call Elink  [phone]

## 2022-12-21 NOTE — Progress Notes (Signed)
Nutrition Follow-up  DOCUMENTATION CODES:   Not applicable  INTERVENTION:   Tube feeding via PEG tube (once placed and cleared by trauma)  Vital 1.2 at 55 ml/h (1320 ml per day)  Provides 1584 kcal, 99 gm protein, 1070 ml free water daily  NUTRITION DIAGNOSIS:   Inadequate oral intake related to inability to eat as evidenced by NPO status. Ongoing.   GOAL:   Provide needs based on ASPEN/SCCM guidelines Met with TF at goal   MONITOR:   Vent status, TF tolerance, Labs, Weight trends  REASON FOR ASSESSMENT:   Consult Enteral/tube feeding initiation and management  ASSESSMENT:   82 year old female with PMHx of HTN, osteopenia, RLS, sacroiliitis admitted with large left hemispheric parietal/temporal IPH with vasogenic edema/mass effect, also with acute respiratory failure and concern for aspiration PNA given emesis per-intubation.  Pt discussed during ICU rounds and with RN.  Plan for further [place] discussion with family due to catastrophic bleed.  Plan for trach and PEG   Current wt: 58.5 kg  Admission wt: 57.8 kg   Medications reviewed and include: colace, pepcid, miralax  Fentanyl  Neo-synephrine @ 45 mcg   Labs reviewed CBG's: 103-157  47F OG tube; tip in body of stomach   Diet Order:   Diet Order             Diet NPO time specified  Diet effective midnight                   EDUCATION NEEDS:   Not appropriate for education at this time  Skin:  Skin Assessment: Skin Integrity Issues: Skin Integrity Issues:: Stage II Stage II: nose  Last BM:  250 ml via FMS  Height:   Ht Readings from Last 1 Encounters:  11/29/22 5\' 1"  (1.549 m)    Weight:   Wt Readings from Last 1 Encounters:  12/20/22 58.5 kg    Ideal Body Weight:  47.7 kg  BMI:  Body mass index is 24.37 kg/m.  Estimated Nutritional Needs:   Kcal:  1500-1700  Protein:  80-100 grams  Fluid:  1.5-1.7 L/day  Lockie Pares., RD, LDN, CNSC See AMiON for contact information

## 2022-12-21 NOTE — Progress Notes (Signed)
Lower extremity venous duplex has been completed.   Preliminary results in CV Proc.   Jinny Blossom Renna Kilmer 12/21/2022 11:36 AM

## 2022-12-21 NOTE — Procedures (Signed)
Operative Note  Date: 12/21/2022  Procedure: percutaneous tracheostomy without bronchoscopic assistance, esophagogastroduodenoscopy (EGD) and percutaneous endoscopic gastrostomy (PEG) tube placement  Pre-op diagnosis: prolonged mechanical ventilation, dysphagia Post-op diagnosis: same  Indication and clinical history: The patient is a 82 y.o. year old female with prolonged mechanical ventilation and dysphagia  Surgeon: Jesusita Oka, MD Assistant: Rodolph Bong, PA  Findings:  Specimen: none EBL: <5cc  Drains/Implants: #6 Shiley cuffed  tracheostomy tube, PEG tube, 3 cm at the skin  Friable mass at the GEJ  Disposition: ICU  Description of procedure: The patient was positioned supine with a shoulder roll. Time-out was performed verifying correct patient, procedure, signature of informed consent, and pre-operative antibiotics as indicated. Anesthetic induction was uneventful and the patient was confirmed to be on 100% FiO2. The neck was prepped and draped in the usual sterile fashion. Palpation of neck anatomy was performed. A longitudinal incision was made in the neck and deepened down to the trachea.   The pilot balloon was deflated and the endotracheal tube slowly retracted proximally until the tip of the endotracheal tube was palpated just below the cricoid cartilage. An introducer needle was inserted between the second and third tracheal rings. A guidewire was passed through the introducer needle and the needle removed. Serial dilation was performed and a #6   cuffed Shiley and stylet were inserted over the guidewire and the guidewire and stylet removed. The inner cannula was inserted, the pilot balloon on the tracheostomy inflated, and the ventilator tubing disconnected from the endotracheal tube and connected to the tracheostomy. Chest rise, appropriate end-tidal CO2, and return tidal volumes were confirmed. The tracheostomy tube was sutured in four quadrants and a tracheostomy tie  applied to the neck. The endotracheal tube was removed. The patient tolerated the procedure well. There were no complications. Post-procedure chest x-ray was ordered to confirm tube position and the absence of a pneumothorax.  A bite block was placed into the oropharynx. The endoscope was inserted into the oropharynx and advanced down the esophagus into the stomach and into the duodenum. A friable mass was noted at the GE junction. The visualized duodenum was unremarkable. The endoscope was retracted back into the stomach and the stomach was insufflated. The stomach was inspected and was also normal. Transillumination was performed. The light was visible on the external skin and dimpling of the stomach was noted endoscopically with manual pressure. The abdomen was prepped and draped in the usual sterile fashion. Transillumination and dimpling were repeated and local anesthetic was infiltrated to make a skin wheal at the site of transillumination. The needle was inserted perpendicularly to the skin and the tip of the needle was visualized endoscopically. As the needle was retracted, the tract was also anesthetized. A skin nick was made at the site of the wheal and an introducer needle and sheath were inserted. The needle was removed and guidewire inserted. The guidewire was grasped by an endoscopic snare and the snare, guidewire, and endoscope retracted out of the oropharynx. The PEG tube was secured to the guidewire and retracted through the mouth and esophagus into the stomach. The PEG tube was secured with a bolster and was visualized endoscopically to spin freely circumferentially and also be without gaps between the internal bumper and the stomach wall. There was no evidence of bleeding. The PEG bolster was secured at 3cm at the skin and there were no gaps between the bolster and the abdominal wall. The stomach was desufflated endoscopically and the endoscope removed. The bite  block was also removed. The  patient tolerated the procedure well and there were no complications.   The patient may have water and medications administered via the PEG tube beginning immediately and tube feeds may be initiated four hours post-procedure.        Jesusita Oka, MD General and [place] [facility]

## 2022-12-21 NOTE — Progress Notes (Signed)
VAST team consulted due to IV watch alarming. PIV above USGIV (right FA) infiltrated, resulting in alarm sounding.  USGIV infusing without issue. 2 VAST RN to bedside to assess for an additional site for USGIV suitable for vasopressors. No vessels found. IV catheter visible on ultrasound in vessel, confirmed by 2 RN's.  Primary nurse aware.

## 2022-12-21 NOTE — Progress Notes (Signed)
   Trauma/Critical Care Follow Up Note  Subjective:    Overnight Issues:   Objective:  Vital signs for last 24 hours: Temp:  [98.3 F (36.8 C)-100.7 F (38.2 C)] 98.3 F (36.8 C) (01/12 0400) Pulse Rate:  [64-88] 76 (01/12 1000) Resp:  [16-26] 22 (01/12 1000) BP: (101-131)/(42-64) 120/58 (01/12 1000) SpO2:  [95 %-100 %] 100 % (01/12 1000) FiO2 (%):  [30 %] 30 % (01/12 0400)  Hemodynamic parameters for last 24 hours:    Intake/Output from previous day: 01/11 0701 - 01/12 0700 In: 2225.6 [I.V.:1169.6; NG/GT:956.1] Out: 1160 [Urine:910; Stool:250]  Intake/Output this shift: Total I/O In: 216.4 [I.V.:166.4; NG/GT:50] Out: 160 [Urine:160]  Vent settings for last 24 hours: Vent Mode: PRVC FiO2 (%):  [30 %] 30 % Set Rate:  [16 bmp] 16 bmp Vt Set:  [380 mL-580 mL] 380 mL PEEP:  [5 cmH20] 5 cmH20 Plateau Pressure:  [13 cmH20] 13 cmH20  Physical Exam:  Gen: comfortable, no distress Neuro: unresponsive HEENT: PERRL Neck: supple CV: RRR Pulm: unlabored breathing Abd: soft, NT GU: clear yellow urine Extr: wwp, no edema   Results for orders placed or performed during the hospital encounter of 11/29/22 (from the past 24 hour(s))  Glucose, capillary     Status: Abnormal   Collection Time: 12/20/22 11:21 AM  Result Value Ref Range   Glucose-Capillary 138 (H) 70 - 99 mg/dL  Glucose, capillary     Status: Abnormal   Collection Time: 12/20/22  3:29 PM  Result Value Ref Range   Glucose-Capillary 130 (H) 70 - 99 mg/dL  Glucose, capillary     Status: Abnormal   Collection Time: 12/20/22  7:40 PM  Result Value Ref Range   Glucose-Capillary 142 (H) 70 - 99 mg/dL  Glucose, capillary     Status: Abnormal   Collection Time: 12/20/22 11:25 PM  Result Value Ref Range   Glucose-Capillary 157 (H) 70 - 99 mg/dL  Glucose, capillary     Status: Abnormal   Collection Time: 12/21/22  3:16 AM  Result Value Ref Range   Glucose-Capillary 118 (H) 70 - 99 mg/dL  Glucose, capillary      Status: Abnormal   Collection Time: 12/21/22  7:56 AM  Result Value Ref Range   Glucose-Capillary 103 (H) 70 - 99 mg/dL    Assessment & Plan: The plan of care was discussed with the bedside nurse for the day, who is in agreement with this plan and no additional concerns were raised.   Present on Admission:  ICH (intracerebral hemorrhage) ([place])    LOS: 22 days   Additional comments:I reviewed the patient's new clinical lab test results.   and I reviewed the patients new imaging test results.    Catastrophic ICH   Prolonged mechanical ventilation and dysphagia - discussion held with patient's son-in-law again this AM and continues to desire to proceed with trach/PEG today.    Jesusita Oka, MD Trauma & General Surgery Please use [email or web address] to contact on call provider  12/21/2022  *Care during the described time interval was provided by me. I have reviewed this patient's available data, including medical history, events of note, physical examination and test results as part of my evaluation.

## 2022-12-22 DIAGNOSIS — I619 Nontraumatic intracerebral hemorrhage, unspecified: Secondary | ICD-10-CM | POA: Diagnosis not present

## 2022-12-22 LAB — BASIC METABOLIC PANEL
Anion gap: 12 (ref 5–15)
Anion gap: 8 (ref 5–15)
BUN: 22 mg/dL (ref 8–23)
BUN: 19 mg/dL (ref 8–23)
CO2: 22 mmol/L (ref 22–32)
CO2: 26 mmol/L (ref 22–32)
Calcium: 8.3 mg/dL — ABNORMAL LOW (ref 8.9–10.3)
Calcium: 7.9 mg/dL — ABNORMAL LOW (ref 8.9–10.3)
Chloride: 106 mmol/L (ref 98–111)
Chloride: 106 mmol/L (ref 98–111)
Creatinine, Ser: 0.63 mg/dL (ref 0.44–1.00)
Creatinine, Ser: 0.59 mg/dL (ref 0.44–1.00)
GFR, Estimated: >60 mL/min (ref >60)
GFR, Estimated: >60 mL/min (ref >60)
Glucose, Bld: 121 mg/dL — ABNORMAL HIGH (ref 70–99)
Glucose, Bld: 160 mg/dL — ABNORMAL HIGH (ref 70–99)
Potassium: 7.2 mmol/L (ref 3.5–5.1)
Potassium: 3.3 mmol/L — ABNORMAL LOW (ref 3.5–5.1)
Sodium: 140 mmol/L (ref 135–145)
Sodium: 140 mmol/L (ref 135–145)

## 2022-12-22 LAB — CBC
HCT: 31.2 % — ABNORMAL LOW (ref 36.0–46.0)
Hemoglobin: 10.2 g/dL — ABNORMAL LOW (ref 12.0–15.0)
MCH: 31 pg (ref 26.0–34.0)
MCHC: 32.7 g/dL (ref 30.0–36.0)
MCV: 94.8 fL (ref 80.0–100.0)
Platelets: 303 10*3/uL (ref 150–400)
RBC: 3.29 MIL/uL — ABNORMAL LOW (ref 3.87–5.11)
RDW: 12.6 % (ref 11.5–15.5)
WBC: 15.3 10*3/uL — ABNORMAL HIGH (ref 4.0–10.5)
nRBC: 0 % (ref 0.0–0.2)

## 2022-12-22 LAB — GLUCOSE, CAPILLARY
Glucose-Capillary: 138 mg/dL — ABNORMAL HIGH (ref 70–99)
Glucose-Capillary: 133 mg/dL — ABNORMAL HIGH (ref 70–99)
Glucose-Capillary: 135 mg/dL — ABNORMAL HIGH (ref 70–99)
Glucose-Capillary: 164 mg/dL — ABNORMAL HIGH (ref 70–99)
Glucose-Capillary: 127 mg/dL — ABNORMAL HIGH (ref 70–99)
Glucose-Capillary: 133 mg/dL — ABNORMAL HIGH (ref 70–99)

## 2022-12-22 LAB — PROCALCITONIN: Procalcitonin: 0.19 ng/mL

## 2022-12-22 MED ORDER — POTASSIUM CHLORIDE 20 MEQ PO PACK
40.0000 meq | PACK | ORAL | Status: AC
  Administered 2022-12-22 (×2): 40 meq
  Filled 2022-12-22 (×2): qty 2

## 2022-12-22 MED ORDER — FENTANYL CITRATE PF 50 MCG/ML IJ SOSY
50.0000 ug | PREFILLED_SYRINGE | INTRAMUSCULAR | Status: DC | PRN
  Administered 2022-12-22 – 2023-01-03 (×26): 50 ug via INTRAVENOUS
  Filled 2022-12-22 (×27): qty 1

## 2022-12-22 NOTE — Progress Notes (Signed)
Subjective/Chief Complaint: No issues overnight   Objective: Vital signs in last 24 hours: Temp:  [98.9 F (37.2 C)-100.4 F (38 C)] 100.4 F (38 C) (01/13 0400) Pulse Rate:  [68-114] 114 (01/13 0900) Resp:  [15-31] 31 (01/13 0900) BP: (98-167)/(45-88) 148/73 (01/13 0900) SpO2:  [99 %-100 %] 100 % (01/13 0900) FiO2 (%):  [30 %-40 %] 40 % (01/13 0750) Last BM Date : 12/22/22  Intake/Output from previous day: 01/12 0701 - 01/13 0700 In: 1453.8 [I.V.:802.5; NG/GT:651.3] Out: 915 [Urine:815; Stool:100] Intake/Output this shift: Total I/O In: 73.7 [I.V.:18.7; NG/GT:55] Out: -   Neck:  trach c d I Abd: PEG  in place  Lab Results:  No results for input(s): "WBC", "HGB", "HCT", "PLT" in the last 72 hours. BMET No results for input(s): "NA", "K", "CL", "CO2", "GLUCOSE", "BUN", "CREATININE", "CALCIUM" in the last 72 hours. PT/INR No results for input(s): "LABPROT", "INR" in the last 72 hours. ABG No results for input(s): "PHART", "HCO3" in the last 72 hours.  Invalid input(s): "PCO2", "PO2"  Studies/Results: DG CHEST PORT 1 VIEW  Result Date: 12/21/2022 CLINICAL DATA:  Peg tube.  Post trach. EXAM: PORTABLE CHEST 1 VIEW COMPARISON:  12/16/2022 FINDINGS: Endotracheal tube has been removed. Tracheostomy tube is in place, tip approximately 3.9 centimeters above the carina. Heart size is normal. There is dense opacification of the LEFT lung base consistent with atelectasis or consolidation and stable over recent exams. There is minimal atelectasis at the RIGHT lung base. No pleural effusions or pulmonary edema. Despite history, a PEG tube is not identified. IMPRESSION: 1. Interval removal of endotracheal tube and placement of a tracheostomy tube. 2. Stable appearance of LEFT lower lobe atelectasis or consolidation. 3. No PEG tube identified. Electronically Signed   By: Nolon Nations M.D.   On: 12/21/2022 15:35   VAS [place] LOWER EXTREMITY VENOUS (DVT)  Result Date: 12/21/2022   Lower Venous DVT Study Patient Name:  Pamela Johnston  Date of Exam:   12/21/2022 Medical Rec #: [id number]      Accession #:    [id number] Date of Birth: 08/18/1941       Patient Gender: F Patient Age:   82 years Exam Location:  [facility] Procedure:      VAS [place] LOWER EXTREMITY VENOUS (DVT) Referring Phys: Elease Etienne --------------------------------------------------------------------------------  Indications: Edema.  Comparison Study: no prior Performing Technologist: Archie Patten RVS  Examination Guidelines: A complete evaluation includes B-mode imaging, spectral Doppler, color Doppler, and power Doppler as needed of all accessible portions of each vessel. Bilateral testing is considered an integral part of a complete examination. Limited examinations for reoccurring indications may be performed as noted. The reflux portion of the exam is performed with the patient in reverse Trendelenburg.  +---------+---------------+---------+-----------+----------+--------------+ RIGHT    CompressibilityPhasicitySpontaneityPropertiesThrombus Aging +---------+---------------+---------+-----------+----------+--------------+ CFV      Full           Yes      Yes                                 +---------+---------------+---------+-----------+----------+--------------+ SFJ      Full                                                        +---------+---------------+---------+-----------+----------+--------------+  FV Prox  Full                                                        +---------+---------------+---------+-----------+----------+--------------+ FV Mid   Full                                                        +---------+---------------+---------+-----------+----------+--------------+ FV DistalFull                                                        +---------+---------------+---------+-----------+----------+--------------+ PFV      Full                                                         +---------+---------------+---------+-----------+----------+--------------+ POP      Full           Yes      Yes                                 +---------+---------------+---------+-----------+----------+--------------+ PTV      Full                                                        +---------+---------------+---------+-----------+----------+--------------+ PERO     Full                                                        +---------+---------------+---------+-----------+----------+--------------+   +---------+---------------+---------+-----------+----------+--------------+ LEFT     CompressibilityPhasicitySpontaneityPropertiesThrombus Aging +---------+---------------+---------+-----------+----------+--------------+ CFV      Full           Yes      Yes                                 +---------+---------------+---------+-----------+----------+--------------+ SFJ      Full                                                        +---------+---------------+---------+-----------+----------+--------------+ FV Prox  Full                                                        +---------+---------------+---------+-----------+----------+--------------+  FV Mid   Full                                                        +---------+---------------+---------+-----------+----------+--------------+ FV DistalFull                                                        +---------+---------------+---------+-----------+----------+--------------+ PFV      Full                                                        +---------+---------------+---------+-----------+----------+--------------+ POP      Full           Yes      Yes                                 +---------+---------------+---------+-----------+----------+--------------+ PTV      Full                                                         +---------+---------------+---------+-----------+----------+--------------+ PERO     Full                                                        +---------+---------------+---------+-----------+----------+--------------+     Summary: BILATERAL: - No evidence of deep vein thrombosis seen in the lower extremities, bilaterally. -No evidence of popliteal cyst, bilaterally.   *See table(s) above for measurements and observations. Electronically signed by Monica Martinez MD on 12/21/2022 at 11:41:30 AM.    Final     Anti-infectives: Anti-infectives (From admission, onward)    Start     Dose/Rate Route Frequency Ordered Stop   12/07/22 1400  sulfamethoxazole-trimethoprim (BACTRIM) 200-40 MG/5ML suspension 40 mL        40 mL Per Tube Every 12 hours 12/07/22 1313 12/16/22 2212   12/05/22 1100  Ampicillin-Sulbactam (UNASYN) 3 g in sodium chloride 0.9 % 100 mL IVPB  Status:  Discontinued        3 g 200 mL/hr over 30 Minutes Intravenous Every 6 hours 12/05/22 1014 12/07/22 1313   11/29/22 2330  Ampicillin-Sulbactam (UNASYN) 3 g in sodium chloride 0.9 % 100 mL IVPB  Status:  Discontinued        3 g 200 mL/hr over 30 Minutes Intravenous Every 6 hours 11/29/22 2316 12/04/22 0908       Assessment/Plan: Catastrophic ICH   Prolonged mechanical ventilation and dysphagia - s/p PEG/trach-- appears to be doing well.  Please call with any issues.  LOS: 23 days    Ralene Ok 12/22/2022

## 2022-12-22 NOTE — Progress Notes (Signed)
NAME:  Pamela Johnston, MRN:  [id number], DOB:  02-20-1941, LOS: 23 ADMISSION DATE:  11/29/2022, CONSULTATION DATE:  11/29/2022 REFERRING MD:  Leonel Ramsay - Neuro REASON FOR CONSULT: Ventilator management in setting of large IPH  History of Present Illness:  50-yer-old woman who presented to [facility] ED 12/21 as a Code Stroke. LKW 2000 when patient reportedly was in bed and became less responsive with +aphasia and R-sided weakness. One episode of nausea/vomiting en route with EMS. PMHx significant for HTN, chronic sacroiliitis (managed with Percocet), RLS, osteopenia.   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 Labs were grossly unremarkable with WBC mildly elevated to 12 and mild hyperglycemia to 154. Decision was made to intubate patient in ED after several episodes of emesis with concern for aspiration. HTS 3% was initiated.   Stroke service to admit. NSGY consulted with no plan for intervention at this time.   PCCM consulted for ventilator/hemodynamic management.   Pertinent Medical History:  Hypertension Sacroilititis Restless legs syndrome Osteopenia  Significant Hospital Events: Including procedures, antibiotic start and stop dates in addition to other pertinent events   12/21 - Presented to [facility] ED via EMS as Code Stroke. LKW 2000. Abrupt onset of decreased responsiveness, aphasia and R hemiplegia. Stroke admitting, NSGY consulted. PCCM consult for vent management. Treated with unasyn for aspiration pneumonia. CT Head with large L hemispheric parietal/temporal lobe IPH with extension into basal ganglia and IVH. HTS 3% started.   12/24 - Palliative care consulted, family requested meeting to be delayed 12/25 - Family requested goals of care meeting to be delayed 12/26 - Stopped  Unasyn, replete phos; [name] GOC conversation: family says full code 12/27 - Restarted Unasyn for fever, RLL infiltrate; resp culture: enterobacter, MDR. Palliative consulted  12/28 - CT Head unchanged 12/29 - Unasyn changed to bactrim 12/31 - Posturing, CT head with increased shift, increased bleeding 1/2 - Family fired CVA service after discussion RE poor prognosis  1/4 - Off sedation  1/5 - No acute issues overnight. Remains on vent with increased respirations off sedation. Fever curve slightly decreased  1/6 - Pall care meeting moved to 2pm today. [name] the son-in-law will be present. On ven 30%/ On TF. Neo not started last night. MAP > 65 after fluids bolus. Febrile +.  Last CT head 12/09/22.  1/8 - No significant neurologic/clinical change. Brief GOC discussion with husband, [name], at bedside in AM. Concerns from staff re: patient GOC/decisionmaking, want to ensure husband is included in decisions/information sharing. Attempted bedside meeting but family prematurely ended meeting due to escalated discussion. 1/9 - Febrile overnight to 101.75F, remains on bromocriptine. No significant neurologic exam changes. 1/10 Family meeting yesterday with [name], plan for trach/peg though husband says they are still discussing this morning 1/11 no new issues, plan for PEG tomorrow 1/12 for trach/peg today  Interim History / Subjective:  Underwent Trach/PEG yesterday afternoon.   Objective:   Blood pressure 132/61, pulse 94, temperature (!) 100.4 F (38 C), temperature source Oral, resp. rate (!) 27, height 5\' 1"  (1.549 m), weight 58.5 kg, SpO2 100 %.    Vent Mode: PRVC FiO2 (%):  [30 %-40 %] 40 % Set Rate:  [16 bmp] 16 bmp Vt Set:  [380 mL] 380 mL PEEP:  [5 cmH20] 5 cmH20 Plateau Pressure:  [11 cmH20-14 cmH20]  11 cmH20   Intake/Output Summary (Last 24 hours) at 12/22/2022 4098 Last data filed at 12/22/2022 0800 Gross per 24 hour  Intake 1437.99 ml  Output 815 ml  Net 622.99 ml   Filed  Weights   12/14/22 0500 12/15/22 0500 12/20/22 0400  Weight: 59.7 kg 59.7 kg 58.5 kg    General:  ill-appearing elderly female on vent  HEENT: Trach in place Neuro: Extends uppers, triple flex lowers, does not open eyes, does not follow commands  CV: s1s2 rrr, no m/r/g PULM:  Clear breath sounds, vent assisted breaths  GI: soft, active bowel sounds, non-distended  Extremities: warm/dry, 1+ edema  Skin: no rashes or lesions  Resolved Hospital Problem List:  Hypotension Hyperglycemia   Assessment & Plan:   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 Brain compression ICH R parietal lobe hemorrhage with associated edema  Severe Encephalopathy -L parietal and temporal lobe extending to basal ganglia with IVH and mass effect, vasogenic edema, midline shift, subfalcine herniation  -Critical care primary after Stroke team signed off (per family request) -multiple family meetings completed including with CMO, son in law [name] has been designated medical decision-maker Plan - Seizure/aspiration precautions - Neuroprotective measures: HOB > 30 degrees, normoglycemia, normothermia,  - Very poor prognosis; unlikely meaningful neurologic recovery. This has been conveyed to the family on multiple occasions and via multiple physicians - PMT following  Acute respiratory failure with hypoxia due to large ICH c/b Enterobacter PNA Culture positive 12/27 VAP. Completed 10 day course of Bactrim Plan -Maintain full vent support with SAT/SBT as tolerated -titrate Vent setting to maintain SpO2 greater than or equal to 90%. -HOB elevated 30 degrees. -Plateau pressures less than 30 cm H20.  -Follow chest x-ray, ABG prn.   -Bronchial hygiene and RT/bronchodilator protocol.  Fever, low grade  s/p Bactrim x 10-day course (end 1/8) LE dopplers negative DVT 1/12 Plan - Trend WBC, fever curve - Bromocriptine started 1/4 for management of  possible central fever   Circulatory shock - since 12/15/22 H/O HTN/HLD  Plan  - Neo titrated to goal systolic goal > 90, <160  - Trend WBC, fever curve - Hold home antihypertensives - Continue statin - Cardiac monitoring  Nutritional needs S/p PEG 1/12  Plan  - Nutrition/RD following, appreciate recs - Continue TF - Protein supplement  Chronic sacroiliitis Osteopenia RLS - Supportive care  GOC 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  Anticipate severe long term disability without chance of independent function.  - PMT following, appreciate assistance with management - Family continues to desire full code status/full scope of care - Last PMT meeting 1/9 with CMO, continue full code and aggressive care  Best Practice (right click and "Reselect all SmartList Selections" daily)   Diet/type: tubefeeds DVT prophylaxis: SCD GI prophylaxis: H2B Lines: N/A Foley:  Yes, and it is still needed + urecholine (d/w RN) Code Status:  full code Last date of multidisciplinary goals of care discussion Theodoro Grist (son in law) 1/3] - pall meet 12/15/22 - full code.  Meeting with Frederik Schmidt CMO 1/9  Critical care time: 35 minutes   The patient is critically ill with multiple organ system failure and requires high complexity decision making for assessment and support, frequent evaluation and titration of therapies, advanced monitoring, review of radiographic studies and interpretation of complex data.   Critical Care Time devoted to patient care services, exclusive of separately billable procedures, described in this note is 35 minutes.  Leitha Bleak  [facility]  PCCM Pgr: [phone]

## 2022-12-22 NOTE — Progress Notes (Signed)
PT Cancellation Note  Patient Details Name: Pamela Johnston MRN: [id number] DOB: 05-Jul-1941   Cancelled Treatment:    Reason Eval/Treat Not Completed: Other (comment) New PT eval believed to be put in due to automatic order set by TRAUMA when completing trach procedure yesterday. CCM stated to wait until Monday 1/15 to see if PT eval was needed as pt at this time is inappropriate.   Kittie Plater, PT, DPT Acute Rehabilitation Services Secure chat preferred Office #: [phone]    Berline Lopes 12/22/2022, 8:53 AM

## 2022-12-23 DIAGNOSIS — I619 Nontraumatic intracerebral hemorrhage, unspecified: Secondary | ICD-10-CM | POA: Diagnosis not present

## 2022-12-23 LAB — CBC
HCT: 27.7 % — ABNORMAL LOW (ref 36.0–46.0)
Hemoglobin: 8.9 g/dL — ABNORMAL LOW (ref 12.0–15.0)
MCH: 31.1 pg (ref 26.0–34.0)
MCHC: 32.1 g/dL (ref 30.0–36.0)
MCV: 96.9 fL (ref 80.0–100.0)
Platelets: 232 10*3/uL (ref 150–400)
RBC: 2.86 MIL/uL — ABNORMAL LOW (ref 3.87–5.11)
RDW: 12.7 % (ref 11.5–15.5)
WBC: 14.9 10*3/uL — ABNORMAL HIGH (ref 4.0–10.5)
nRBC: 0 % (ref 0.0–0.2)

## 2022-12-23 LAB — HEPATIC FUNCTION PANEL
ALT: 96 U/L — ABNORMAL HIGH (ref 0–44)
AST: 96 U/L — ABNORMAL HIGH (ref 15–41)
Albumin: 1.6 g/dL — ABNORMAL LOW (ref 3.5–5.0)
Alkaline Phosphatase: 61 U/L (ref 38–126)
Bilirubin, Direct: 0.1 mg/dL (ref 0.0–0.2)
Indirect Bilirubin: 0.1 mg/dL — ABNORMAL LOW (ref 0.3–0.9)
Total Bilirubin: 0.2 mg/dL — ABNORMAL LOW (ref 0.3–1.2)
Total Protein: 4.9 g/dL — ABNORMAL LOW (ref 6.5–8.1)

## 2022-12-23 LAB — GLUCOSE, CAPILLARY
Glucose-Capillary: 137 mg/dL — ABNORMAL HIGH (ref 70–99)
Glucose-Capillary: 118 mg/dL — ABNORMAL HIGH (ref 70–99)
Glucose-Capillary: 134 mg/dL — ABNORMAL HIGH (ref 70–99)
Glucose-Capillary: 135 mg/dL — ABNORMAL HIGH (ref 70–99)
Glucose-Capillary: 130 mg/dL — ABNORMAL HIGH (ref 70–99)
Glucose-Capillary: 130 mg/dL — ABNORMAL HIGH (ref 70–99)

## 2022-12-23 LAB — BASIC METABOLIC PANEL
Anion gap: 8 (ref 5–15)
BUN: 19 mg/dL (ref 8–23)
CO2: 25 mmol/L (ref 22–32)
Calcium: 7.9 mg/dL — ABNORMAL LOW (ref 8.9–10.3)
Chloride: 108 mmol/L (ref 98–111)
Creatinine, Ser: 0.56 mg/dL (ref 0.44–1.00)
GFR, Estimated: >60 mL/min (ref >60)
Glucose, Bld: 139 mg/dL — ABNORMAL HIGH (ref 70–99)
Potassium: 4.3 mmol/L (ref 3.5–5.1)
Sodium: 141 mmol/L (ref 135–145)

## 2022-12-23 LAB — PROCALCITONIN: Procalcitonin: 0.12 ng/mL

## 2022-12-23 LAB — PHOSPHORUS: Phosphorus: 2.3 mg/dL — ABNORMAL LOW (ref 2.5–4.6)

## 2022-12-23 LAB — MAGNESIUM: Magnesium: 1.9 mg/dL (ref 1.7–2.4)

## 2022-12-23 MED ORDER — BETHANECHOL CHLORIDE 25 MG PO TABS
25.0000 mg | ORAL_TABLET | Freq: Three times a day (TID) | ORAL | Status: DC
  Administered 2022-12-23 – 2022-12-25 (×8): 25 mg
  Filled 2022-12-23 (×8): qty 1

## 2022-12-23 MED ORDER — MAGNESIUM SULFATE 2 GM/50ML IV SOLN
2.0000 g | Freq: Once | INTRAVENOUS | Status: AC
  Administered 2022-12-23: 2 g via INTRAVENOUS
  Filled 2022-12-23: qty 50

## 2022-12-23 MED ORDER — POTASSIUM & SODIUM PHOSPHATES 280-160-250 MG PO PACK
1.0000 | PACK | Freq: Three times a day (TID) | ORAL | Status: AC
  Administered 2022-12-23 (×3): 1
  Filled 2022-12-23 (×3): qty 1

## 2022-12-23 MED ORDER — PHENYLEPHRINE HCL-NACL 20-0.9 MG/250ML-% IV SOLN: 25.0000 ug/min | INTRAVENOUS | Status: DC

## 2022-12-23 MED ORDER — SODIUM CHLORIDE 0.9 % IV SOLN
250.0000 mL | INTRAVENOUS | Status: DC
  Administered 2022-12-23 – 2022-12-29 (×2): 250 mL via INTRAVENOUS

## 2022-12-23 MED ORDER — ALBUMIN HUMAN 25 % IV SOLN
12.5000 g | Freq: Once | INTRAVENOUS | Status: AC
  Administered 2022-12-23: 12.5 g via INTRAVENOUS
  Filled 2022-12-23: qty 50

## 2022-12-23 MED ORDER — FUROSEMIDE 10 MG/ML IJ SOLN
20.0000 mg | Freq: Once | INTRAMUSCULAR | Status: AC
  Administered 2022-12-23: 20 mg via INTRAVENOUS
  Filled 2022-12-23: qty 2

## 2022-12-23 NOTE — Progress Notes (Signed)
NAME:  Pamela Johnston, MRN:  [id number], DOB:  12/11/1940, LOS: 24 ADMISSION DATE:  11/29/2022, CONSULTATION DATE:  11/29/2022 REFERRING MD:  Amada Jupiter - Neuro REASON FOR CONSULT: Ventilator management in setting of large IPH  History of Present Illness:  81-yer-old woman who presented to [facility] ED 12/21 as a Code Stroke. LKW 2000 when patient reportedly was in bed and became less responsive with +aphasia and R-sided weakness. One episode of nausea/vomiting en route with EMS. PMHx significant for HTN, chronic sacroiliitis (managed with Percocet), RLS, osteopenia.   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 Labs were grossly unremarkable with WBC mildly elevated to 12 and mild hyperglycemia to 154. Decision was made to intubate patient in ED after several episodes of emesis with concern for aspiration. HTS 3% was initiated.   Stroke service to admit. NSGY consulted with no plan for intervention at this time.   PCCM consulted for ventilator/hemodynamic management.   Pertinent Medical History:  Hypertension Sacroilititis Restless legs syndrome Osteopenia  Significant Hospital Events: Including procedures, antibiotic start and stop dates in addition to other pertinent events   12/21 - Presented to [facility] ED via EMS as Code Stroke. LKW 2000. Abrupt onset of decreased responsiveness, aphasia and R hemiplegia. Stroke admitting, NSGY consulted. PCCM consult for vent management. Treated with unasyn for aspiration pneumonia. CT Head with large L hemispheric parietal/temporal lobe IPH with extension into basal ganglia and IVH. HTS 3% started.   12/24 - Palliative care consulted, family requested meeting to be delayed 12/25 - Family requested goals of care meeting to be delayed 12/26 - Stopped  Unasyn, replete phos; [name] GOC conversation: family says full code 12/27 - Restarted Unasyn for fever, RLL infiltrate; resp culture: enterobacter, MDR. Palliative consulted  12/28 - CT Head unchanged 12/29 - Unasyn changed to bactrim 12/31 - Posturing, CT head with increased shift, increased bleeding 1/2 - Family fired CVA service after discussion RE poor prognosis  1/4 - Off sedation  1/5 - No acute issues overnight. Remains on vent with increased respirations off sedation. Fever curve slightly decreased  1/6 - Pall care meeting moved to 2pm today. Pamela Johnston the son-in-law will be present. On ven 30%/ On TF. Neo not started last night. MAP > 65 after fluids bolus. Febrile +.  Last CT head 12/09/22.  1/8 - No significant neurologic/clinical change. Brief GOC discussion with husband, Pamela Johnston, at bedside in AM. Concerns from staff re: patient GOC/decisionmaking, want to ensure husband is included in decisions/information sharing. Attempted bedside meeting but family prematurely ended meeting due to escalated discussion. 1/9 - Febrile overnight to 101.58F, remains on bromocriptine. No significant neurologic exam changes. 1/10 Family meeting yesterday with Dr. Lindie Spruce, plan for trach/peg though husband says they are still discussing this morning 1/11 no new issues, plan for PEG tomorrow 1/12 for trach/peg today  Interim History / Subjective:  This AM failed PS 10/5 due to tachypnea.   Objective:   Blood pressure (!) 114/57, pulse (!) 102, temperature 100.2 F (37.9 C), temperature source Axillary, resp. rate (!) 25, height 5\' 1"  (1.549 m), weight 58.5 kg, SpO2 100 %.    Vent Mode: CPAP;PSV FiO2 (%):  [40 %] 40 % Set Rate:  [16 bmp] 16 bmp Vt Set:  [380 mL] 380 mL PEEP:  [5 cmH20] 5 cmH20 Pressure Support:  [  Valentine Pressure:  [11 cmH20-16 cmH20] 11 cmH20   Intake/Output Summary (Last 24 hours) at 12/23/2022 1020 Last data filed at 12/23/2022 0800 Gross per 24 hour  Intake  1288.93 ml  Output 1235 ml  Net 53.93 ml   Filed Weights   12/14/22 0500 12/15/22 0500 12/20/22 0400  Weight: 59.7 kg 59.7 kg 58.5 kg    General:  ill-appearing elderly female on vent  HEENT: Trach in place Neuro: Extends uppers, triple flex lowers, does not open eyes, does not follow commands  CV: s1s2 rrr, no m/r/g PULM:  Clear breath sounds, vent assisted breaths  GI: soft, active bowel sounds, non-distended  Extremities: warm/dry, 1+ edema  Skin: no rashes or lesions  Resolved Hospital Problem List:  Hypotension Hyperglycemia   Assessment & Plan:   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 Brain compression ICH R parietal lobe hemorrhage with associated edema  Severe Encephalopathy -L parietal and temporal lobe extending to basal ganglia with IVH and mass effect, vasogenic edema, midline shift, subfalcine herniation  -Critical care primary after Stroke team signed off (per family request) -multiple family meetings completed including with CMO, son in law Pamela Johnston has been designated medical decision-maker Plan - Seizure/aspiration precautions - Neuroprotective measures: HOB > 30 degrees, normoglycemia, normothermia,  - Very poor prognosis; unlikely meaningful neurologic recovery. This has been conveyed to the family on multiple occasions and via multiple physicians - PMT following  Acute respiratory failure with hypoxia due to large ICH c/b Enterobacter PNA Culture positive 12/27 VAP. Completed 10 day course of Bactrim Plan -Maintain full vent support with SAT/SBT as tolerated -titrate Vent setting to maintain SpO2 greater than or equal to 90%. -HOB elevated 30 degrees. -Plateau pressures less than 30 cm H20.  -Follow chest x-ray, ABG prn.   -Bronchial hygiene and RT/bronchodilator protocol.  Fever, low grade  -s/p Bactrim x 10-day course (end 1/8) -LE dopplers negative DVT 1/12 Plan - Trend WBC, fever  curve - Bromocriptine started 1/4 for management of possible central fever >>> discuss stopping today   Urinary Retention Plan - Has had numerous foley caths, plans to increase urecholine to 25 mg TID and attempt D/C in 72 hours.   Circulatory shock - since 12/15/22 > off NEO 1/13  H/O HTN/HLD  Plan  - Systolic goal <440  - Trend WBC, fever curve - Hold home antihypertensives - Continue statin - Cardiac monitoring  Nutritional needs S/p PEG 1/12  Plan  - Nutrition/RD following, appreciate recs - Continue TF - Protein supplement  Chronic sacroiliitis Osteopenia RLS - Supportive care  GOC 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  Anticipate severe long term disability without chance of independent function.  - PMT following, appreciate assistance with management - Family continues to desire full code status/full scope of care - Last PMT meeting 1/9 with CMO, continue full code and aggressive care  Best Practice (right click and "Reselect all SmartList Selections" daily)   Diet/type: tubefeeds DVT prophylaxis: SCD GI prophylaxis: H2B Lines: N/A Foley:  Yes, and it is still needed + urecholine (d/w RN) Code Status:  full code Last date of multidisciplinary goals of care discussion Pamela Johnston (son in law) 1/3] - pall meet 12/15/22 - full code.  Meeting with Pamela Johnston CMO 1/9  Critical care time: 32 minutes   The patient is critically ill with multiple organ system failure and requires high complexity decision making for assessment and support, frequent evaluation and  titration of therapies, advanced monitoring, review of radiographic studies and interpretation of complex data.   Critical Care Time devoted to patient care services, exclusive of separately billable procedures, described in this note is 32 minutes.  Hayden Pedro, AGACNP-BC [place] [facility]  PCCM Pgr: [phone]

## 2022-12-24 ENCOUNTER — Inpatient Hospital Stay (HOSPITAL_COMMUNITY): Payer: Medicare PPO

## 2022-12-24 DIAGNOSIS — I619 Nontraumatic intracerebral hemorrhage, unspecified: Secondary | ICD-10-CM | POA: Diagnosis not present

## 2022-12-24 LAB — BASIC METABOLIC PANEL
Anion gap: 11 (ref 5–15)
BUN: 21 mg/dL (ref 8–23)
CO2: 29 mmol/L (ref 22–32)
Calcium: 7.8 mg/dL — ABNORMAL LOW (ref 8.9–10.3)
Chloride: 102 mmol/L (ref 98–111)
Creatinine, Ser: 0.52 mg/dL (ref 0.44–1.00)
GFR, Estimated: >60 mL/min (ref >60)
Glucose, Bld: 134 mg/dL — ABNORMAL HIGH (ref 70–99)
Potassium: 3.4 mmol/L — ABNORMAL LOW (ref 3.5–5.1)
Sodium: 142 mmol/L (ref 135–145)

## 2022-12-24 LAB — GLUCOSE, CAPILLARY
Glucose-Capillary: 129 mg/dL — ABNORMAL HIGH (ref 70–99)
Glucose-Capillary: 131 mg/dL — ABNORMAL HIGH (ref 70–99)
Glucose-Capillary: 123 mg/dL — ABNORMAL HIGH (ref 70–99)
Glucose-Capillary: 130 mg/dL — ABNORMAL HIGH (ref 70–99)
Glucose-Capillary: 126 mg/dL — ABNORMAL HIGH (ref 70–99)
Glucose-Capillary: 145 mg/dL — ABNORMAL HIGH (ref 70–99)

## 2022-12-24 LAB — PHOSPHORUS: Phosphorus: 3.1 mg/dL (ref 2.5–4.6)

## 2022-12-24 LAB — CBC
HCT: 27 % — ABNORMAL LOW (ref 36.0–46.0)
Hemoglobin: 8.8 g/dL — ABNORMAL LOW (ref 12.0–15.0)
MCH: 31.1 pg (ref 26.0–34.0)
MCHC: 32.6 g/dL (ref 30.0–36.0)
MCV: 95.4 fL (ref 80.0–100.0)
Platelets: 246 10*3/uL (ref 150–400)
RBC: 2.83 MIL/uL — ABNORMAL LOW (ref 3.87–5.11)
RDW: 12.8 % (ref 11.5–15.5)
WBC: 15.8 10*3/uL — ABNORMAL HIGH (ref 4.0–10.5)
nRBC: 0 % (ref 0.0–0.2)

## 2022-12-24 LAB — PROCALCITONIN: Procalcitonin: 0.12 ng/mL

## 2022-12-24 LAB — MAGNESIUM: Magnesium: 2.1 mg/dL (ref 1.7–2.4)

## 2022-12-24 MED ORDER — POTASSIUM CHLORIDE 20 MEQ PO PACK
40.0000 meq | PACK | Freq: Once | ORAL | Status: AC
  Administered 2022-12-24: 40 meq
  Filled 2022-12-24: qty 2

## 2022-12-24 NOTE — Progress Notes (Signed)
PT Cancellation Note  Patient Details Name: Pamela Johnston MRN: [id number] DOB: 26-Dec-1940   Cancelled Treatment:    Reason Eval/Treat Not Completed: Medical issues which prohibited therapy.  Per RN, vented, can't wean.  No response but to pain. 12/24/2022  Ginger Carne., PT Acute Rehabilitation Services [phone]  (office)   [name] 12/24/2022, 11:01 AM

## 2022-12-24 NOTE — Progress Notes (Addendum)
NAME:  Pamela Johnston, MRN:  [id number], DOB:  27-May-1941, LOS: 25 ADMISSION DATE:  11/29/2022, CONSULTATION DATE:  11/29/2022 REFERRING MD:  Leonel Ramsay - Neuro REASON FOR CONSULT: Ventilator management in setting of large IPH  History of Present Illness:  81-yer-old woman who presented to [facility] ED 12/21 as a Code Stroke. LKW 2000 when patient reportedly was in bed and became less responsive with +aphasia and R-sided weakness. One episode of nausea/vomiting en route with EMS. PMHx significant for HTN, chronic sacroiliitis (managed with Percocet), RLS, osteopenia.   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 Labs were grossly unremarkable with WBC mildly elevated to 12 and mild hyperglycemia to 154. Decision was made to intubate patient in ED after several episodes of emesis with concern for aspiration. HTS 3% was initiated.   Stroke service to admit. NSGY consulted with no plan for intervention at this time.   PCCM consulted for ventilator/hemodynamic management.   Pertinent Medical History:  Hypertension Sacroilititis Restless legs syndrome Osteopenia  Significant Hospital Events: Including procedures, antibiotic start and stop dates in addition to other pertinent events   12/21 - Presented to [facility] ED via EMS as Code Stroke. LKW 2000. Abrupt onset of decreased responsiveness, aphasia and R hemiplegia. Stroke admitting, NSGY consulted. PCCM consult for vent management. Treated with unasyn for aspiration pneumonia. CT Head with large L hemispheric parietal/temporal lobe IPH with extension into basal ganglia and IVH. HTS 3% started.   12/24 - Palliative care consulted, family requested meeting to be delayed 12/25 - Family requested goals of care meeting to be delayed 12/26 - Stopped  Unasyn, replete phos; [name] GOC conversation: family says full code 12/27 - Restarted Unasyn for fever, RLL infiltrate; resp culture: enterobacter, MDR. Palliative consulted  12/28 - CT Head unchanged 12/29 - Unasyn changed to bactrim 12/31 - Posturing, CT head with increased shift, increased bleeding 1/2 - Family fired CVA service after discussion RE poor prognosis  1/4 - Off sedation  1/5 - No acute issues overnight. Remains on vent with increased respirations off sedation. Fever curve slightly decreased  1/6 - Pall care meeting moved to 2pm today. [name] the son-in-law will be present. On ven 30%/ On TF. Neo not started last night. MAP > 65 after fluids bolus. Febrile +.  Last CT head 12/09/22.  1/8 - No significant neurologic/clinical change. Brief GOC discussion with husband, [name], at bedside in AM. Concerns from staff re: patient GOC/decisionmaking, want to ensure husband is included in decisions/information sharing. Attempted bedside meeting but family prematurely ended meeting due to escalated discussion. 1/9 - Febrile overnight to 101.90F, remains on bromocriptine. No significant neurologic exam changes. 1/10 Family meeting yesterday with [name], plan for trach/peg though husband says they are still discussing this morning 1/11 no new issues, plan for PEG tomorrow 1/12 for trach/peg today 1/15 No change in Neuro status , trach and PEG, No weaning , will repeat CT head without, get Case management involved in looking for LTAC ( Day 3 post trach on 1/12)    Interim History / Subjective:   Remains unresponsive, trached on full vent support>> failing wean attempts  T Max 100.4, Na 142/ K 3.4/ Cl 102/ BUN 21/ Creatinine 0.52/ Calcium 7.8>> Corrects to 9.7 with albumin of 1.6 Husband at bedside and updated 1/15  Objective:  Blood pressure 132/67, pulse 100, temperature (!) 100.4 F (38 C), temperature source Axillary, resp. rate (!) 34, height 5\' 1"  (1.549 m), weight 58.5 kg, SpO2 100  %.    Vent Mode: PRVC FiO2 (%):  [40 %] 40 % Set Rate:  [16 bmp] 16 bmp Vt Set:  [380 mL] 380 mL PEEP:  [5 cmH20] 5 cmH20 Plateau Pressure:  [13 cmH20-15 cmH20] 13 cmH20   Intake/Output Summary (Last 24 hours) at 12/24/2022 4854 Last data filed at 12/24/2022 0800 Gross per 24 hour  Intake 1408.82 ml  Output 2010 ml  Net -601.18 ml   Filed Weights   12/14/22 0500 12/15/22 0500 12/20/22 0400  Weight: 59.7 kg 59.7 kg 58.5 kg    General:  ill-appearing elderly female on vent , In NAD HEENT: Trach in place, site is clean, sutures are intact, minimal redness / bleeding Neuro: Extends uppers, triple flex lowers, does not open eyes, does not follow commands  CV: s1s2 rrr, no m/r/g PULM:  Bilateral chest excursion , Clear breath sounds, diminished per bases, vent assisted breaths  GI: soft, active bowel sounds, non-distended , PEG with TF infusing at goal,site clean without redness or drainage Extremities: warm/dry, 1+ edema  Skin: no rashes or lesions  Resolved Hospital Problem List:  Hypotension Hyperglycemia   Assessment & Plan:   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 Brain compression ICH R parietal lobe hemorrhage with associated edema  Severe Encephalopathy -L parietal and temporal lobe extending to basal ganglia with IVH and mass effect, vasogenic edema, midline shift, subfalcine herniation  -Critical care primary after Stroke team signed off (per family request) -multiple family meetings completed including with CMO, son in law [name] has been designated medical decision-maker Plan - Seizure/aspiration precautions - Neuroprotective measures: HOB > 30 degrees, normoglycemia, normothermia,  - Very poor prognosis; unlikely meaningful neurologic recovery. This has been conveyed to the family on multiple occasions and via multiple physicians - PMT following - Will repeat CT Chest WO today to re-evaluate - Will ask  CM to start looking for LTAC placement   Acute respiratory failure with hypoxia due to large ICH c/b Enterobacter PNA Culture positive 12/27 VAP. Completed 10 day course of Bactrim Plan -Maintain full vent support with SAT/SBT as tolerated -titrate Vent setting to maintain SpO2 greater than or equal to 90%. -HOB elevated 30 degrees. -Plateau pressures less than 30 cm H20.  -Follow chest x-ray, ABG prn.   -Bronchial hygiene and RT/bronchodilator protocol.  Fever, low grade  -s/p Bactrim x 10-day course (end 1/8) -LE dopplers negative DVT 1/12 - Blood Cx 1/13>> No growth Plan - Trend WBC, fever curve - Bromocriptine started 1/4 for management of possible central fever >>> discuss stopping today  - Will check tracheal aspirate 1/15 as possible source of low grade fever  Urinary Retention Plan - Has had numerous foley caths, plans to increase urecholine to 25 mg TID and attempt D/C in 72 hours.   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  Anticipate severe long term disability without chance of independent function.  - PMT  following, appreciate assistance with management - Family continues to desire full code status/full scope of care>> Husband who does have some dementia updated in full 1/15.  - Last PMT meeting 1/9 with CMO, continue full code and aggressive care  Best Practice (right click and "Reselect all SmartList Selections" daily)   Diet/type: tubefeeds DVT prophylaxis: SCD GI prophylaxis: H2B Lines: N/A Foley:  Yes, and it is still needed + urecholine (d/w  RN) Code Status:  full code Last date of multidisciplinary goals of care discussion Theodoro Grist (son in law) 1/3] - pall meet 12/15/22 - full code.  Meeting with Frederik Schmidt CMO 1/9 Called SIL, [name] and updated in full 1/15, explained we are rechecking a CT Head today, and getting tracheal aspirate.   Critical care time: 30 minutes   The patient is critically ill with multiple organ system failure and requires high complexity decision making for assessment and support, frequent evaluation and titration of therapies, advanced monitoring, review of radiographic studies and interpretation of complex data.   Critical Care Time devoted to patient care services, exclusive of separately billable procedures, described in this note is 30 minutes.  Bevelyn Ngo, MSN, AGACNP-BC [facility] See Amion for personal pager PCCM on call pager [phone]  [facility]  12/24/2022 9:07 AM

## 2022-12-24 NOTE — Progress Notes (Signed)
Patient transported to CT and back without complications. RN at bedside.  

## 2022-12-24 NOTE — TOC Progression Note (Signed)
Transition of Care [facility]) - Progression Note    Patient Details  Name: Pamela Johnston MRN: [id number] Date of Birth: 1941/08/16  Transition of Care [facility]) CM/SW Contact  Oren Section Cleta Alberts, RN Phone Number: 12/24/2022, 4:23 PM  Clinical Narrative:    Referred patient to both area LTACs Insurance claims handler of [place] and [place]) to see if patient is eligible for LTAC admission.  Will follow with updates.      Barriers to Discharge: Continued Medical Work up  Expected Discharge Plan and Services   Discharge Planning Services: CM Consult   Living arrangements for the past 2 months: Single Family Home                                       Social Determinants of Health (SDOH) Interventions    Readmission Risk Interventions     No data to display         Reinaldo Raddle, RN, BSN  Trauma/Neuro ICU Case Manager [phone]

## 2022-12-24 NOTE — Progress Notes (Signed)
[facility] ADULT ICU REPLACEMENT PROTOCOL   The patient does apply for the [facility] Adult ICU Electrolyte Replacment Protocol based on the criteria listed below:   1.Exclusion criteria: TCTS, ECMO, Dialysis, and Myasthenia Gravis patients 2. Is GFR >/= 30 ml/min? Yes.    Patient's GFR today is > 60 3. Is SCr </= 2? Yes.   Patient's SCr is 0.52 mg/dL 4. Did SCr increase >/= 0.5 in 24 hours? No. 5.Pt's weight >40kg  Yes.   6. Abnormal electrolyte(s): K+ 3.4  7. Electrolytes replaced per protocol 8.  Call MD STAT for K+ </= 2.5, Phos </= 1, or Mag </= 1 Physician:  Dr Rebeca Allegra, Elfredia Nevins 12/24/2022 5:48 AM

## 2022-12-25 ENCOUNTER — Inpatient Hospital Stay (HOSPITAL_COMMUNITY): Payer: Medicare PPO

## 2022-12-25 DIAGNOSIS — I619 Nontraumatic intracerebral hemorrhage, unspecified: Secondary | ICD-10-CM | POA: Diagnosis not present

## 2022-12-25 LAB — CBC WITH DIFFERENTIAL/PLATELET
Abs Immature Granulocytes: 0.14 10*3/uL — ABNORMAL HIGH (ref 0.00–0.07)
Basophils Absolute: 0.1 10*3/uL (ref 0.0–0.1)
Basophils Relative: 0 %
Eosinophils Absolute: 0.3 10*3/uL (ref 0.0–0.5)
Eosinophils Relative: 2 %
HCT: 29.4 % — ABNORMAL LOW (ref 36.0–46.0)
Hemoglobin: 9.4 g/dL — ABNORMAL LOW (ref 12.0–15.0)
Immature Granulocytes: 1 %
Lymphocytes Relative: 11 %
Lymphs Abs: 1.6 10*3/uL (ref 0.7–4.0)
MCH: 30.4 pg (ref 26.0–34.0)
MCHC: 32 g/dL (ref 30.0–36.0)
MCV: 95.1 fL (ref 80.0–100.0)
Monocytes Absolute: 0.8 10*3/uL (ref 0.1–1.0)
Monocytes Relative: 6 %
Neutro Abs: 12 10*3/uL — ABNORMAL HIGH (ref 1.7–7.7)
Neutrophils Relative %: 80 %
Platelets: 282 10*3/uL (ref 150–400)
RBC: 3.09 MIL/uL — ABNORMAL LOW (ref 3.87–5.11)
RDW: 12.9 % (ref 11.5–15.5)
WBC: 14.8 10*3/uL — ABNORMAL HIGH (ref 4.0–10.5)
nRBC: 0 % (ref 0.0–0.2)

## 2022-12-25 LAB — COMPREHENSIVE METABOLIC PANEL
ALT: 118 U/L — ABNORMAL HIGH (ref 0–44)
AST: 113 U/L — ABNORMAL HIGH (ref 15–41)
Albumin: 1.9 g/dL — ABNORMAL LOW (ref 3.5–5.0)
Alkaline Phosphatase: 91 U/L (ref 38–126)
Anion gap: 9 (ref 5–15)
BUN: 24 mg/dL — ABNORMAL HIGH (ref 8–23)
CO2: 30 mmol/L (ref 22–32)
Calcium: 8.1 mg/dL — ABNORMAL LOW (ref 8.9–10.3)
Chloride: 102 mmol/L (ref 98–111)
Creatinine, Ser: 0.57 mg/dL (ref 0.44–1.00)
GFR, Estimated: >60 mL/min (ref >60)
Glucose, Bld: 139 mg/dL — ABNORMAL HIGH (ref 70–99)
Potassium: 3.8 mmol/L (ref 3.5–5.1)
Sodium: 141 mmol/L (ref 135–145)
Total Bilirubin: 0.2 mg/dL — ABNORMAL LOW (ref 0.3–1.2)
Total Protein: 5.4 g/dL — ABNORMAL LOW (ref 6.5–8.1)

## 2022-12-25 LAB — MAGNESIUM: Magnesium: 2.1 mg/dL (ref 1.7–2.4)

## 2022-12-25 LAB — GLUCOSE, CAPILLARY
Glucose-Capillary: 133 mg/dL — ABNORMAL HIGH (ref 70–99)
Glucose-Capillary: 155 mg/dL — ABNORMAL HIGH (ref 70–99)
Glucose-Capillary: 125 mg/dL — ABNORMAL HIGH (ref 70–99)
Glucose-Capillary: 131 mg/dL — ABNORMAL HIGH (ref 70–99)
Glucose-Capillary: 157 mg/dL — ABNORMAL HIGH (ref 70–99)
Glucose-Capillary: 132 mg/dL — ABNORMAL HIGH (ref 70–99)

## 2022-12-25 LAB — PHOSPHORUS: Phosphorus: 3.4 mg/dL (ref 2.5–4.6)

## 2022-12-25 NOTE — TOC Progression Note (Signed)
Transition of Care [facility]) - Progression Note    Patient Details  Name: Pamela Johnston MRN: [id number] Date of Birth: Nov 25, 1941  Transition of Care [facility]) CM/SW [place], LCSW Phone Number: 12/25/2022, 4:54 PM  Clinical Narrative:    Patient declined by both LTACH programs. CSW sent Vent SNF referral to [facility] and [facility]. Patient's insurance is not in network with [facility]. These are the only three Vent SNFs in [place].      Barriers to Discharge: Continued Medical Work up  Expected Discharge Plan and Services   Discharge Planning Services: CM Consult   Living arrangements for the past 2 months: Single Family Home                                       Social Determinants of Health (SDOH) Interventions    Readmission Risk Interventions     No data to display

## 2022-12-25 NOTE — NC FL2 (Signed)
[place] LEVEL OF CARE FORM     IDENTIFICATION  Patient Name: Pamela Johnston Birthdate: Dec 22, 1940 Sex: female Admission Date (Current Location): 11/29/2022  [facility] and [place] Number:  Herbalist and Address:  [facility] [place] [address] [place], [place] [address]      Provider Number: [id number]  Attending Physician Name and Address:  Laurin Coder, MD  Relative Name and Phone Number:       Current Level of Care: Hospital Recommended Level of Care: Vent SNF Prior Approval Number:    Date Approved/Denied:   PASRR Number: [id number]  Discharge Plan: Other (Comment) (Vent SNF)    Current Diagnoses: Patient Active Problem List   Diagnosis Date Noted   Pneumonia due to Enterobacter aerogenes ([place]) 12/12/2022   Goals of care, counseling/discussion 12/12/2022   Brain compression ([facility]) 12/11/2022   Acute respiratory failure with hypoxia ([place]) 12/11/2022   Aspiration into airway 11/30/2022   Hypoxia 11/30/2022   Intraparenchymal hemorrhage of brain ([place]) 11/30/2022   ICH (intracerebral hemorrhage) ([place]) 11/29/2022   RLS (restless legs syndrome) 11/29/2022   Osteopenia 11/29/2022   Sacroiliitis ([place]) 11/29/2022   Benign essential HTN 11/29/2022    Orientation RESPIRATION BLADDER Height & Weight      (does not follow commands)  Tracheostomy, Vent (40 % fiO2, Shiley 36mm Cuffed) Continent, Indwelling catheter Weight: 128 lb 15.5 oz (58.5 kg) Height:  5\' 1"  (154.9 cm)  BEHAVIORAL SYMPTOMS/MOOD NEUROLOGICAL BOWEL NUTRITION STATUS      Incontinent (rectal pouch) Feeding tube (Vital AF 1.2 CAL)  AMBULATORY STATUS COMMUNICATION OF NEEDS Skin   Total Care Does not communicate PU Stage and Appropriate Care (Stage II on ear and nose)                       Personal Care Assistance Level of Assistance  Bathing, Feeding, Dressing, Total care Bathing Assistance: Maximum assistance Feeding assistance: Maximum  assistance Dressing Assistance: Maximum assistance Total Care Assistance: Maximum assistance   Functional Limitations Info   (intubated)          SPECIAL CARE FACTORS FREQUENCY                       Contractures Contractures Info: Not present    Additional Factors Info  Code Status, Allergies Code Status Info: Full Allergies Info: Nickel, Penicillin G           Current Medications (12/25/2022):  This is the current hospital active medication list Current Facility-Administered Medications  Medication Dose Route Frequency Provider Last Rate Last Admin   0.9 %  sodium chloride infusion  250 mL Intravenous Continuous Omar Person, NP   Stopped at 12/23/22 1158   acetaminophen (TYLENOL) tablet 650 mg  650 mg Oral Q4H PRN Greta Doom, MD       Or   acetaminophen (TYLENOL) 160 MG/5ML solution 650 mg  650 mg Per Tube Q4H PRN Greta Doom, MD   650 mg at 12/25/22 0940   Or   acetaminophen (TYLENOL) suppository 650 mg  650 mg Rectal Q4H PRN Greta Doom, MD       bethanechol (URECHOLINE) tablet 25 mg  25 mg Per Tube TID Omar Person, NP   25 mg at 12/25/22 0940   Chlorhexidine Gluconate Cloth 2 % PADS 6 each  6 each Topical Daily Greta Doom, MD   6 each at 12/25/22 0200  docusate (COLACE) 50 MG/5ML liquid 100 mg  100 mg Per Tube BID Margaretmary Lombard, MD   100 mg at 12/20/22 0938   famotidine (PEPCID) 40 MG/5ML suspension 20 mg  20 mg Per Tube QHS Simonne Maffucci B, MD   20 mg at 12/24/22 2100   feeding supplement (VITAL AF 1.2 CAL) liquid 1,000 mL  1,000 mL Per Tube Continuous Freddi Starr, MD 55 mL/hr at 12/25/22 1200 Infusion Verify at 12/25/22 1200   fentaNYL (SUBLIMAZE) injection 50 mcg  50 mcg Intravenous Q1H PRN Omar Person, NP   50 mcg at 12/25/22 0519   latanoprost (XALATAN) 0.005 % ophthalmic solution 1 drop  1 drop Both Eyes QHS Rosalin Hawking, MD   1 drop at 12/24/22 2100   midazolam (VERSED) injection  1 mg  1 mg Intravenous Q2H PRN Juanito Doom, MD   1 mg at 12/25/22 1191   Oral care mouth rinse  15 mL Mouth Rinse Q2H Greta Doom, MD   15 mL at 12/25/22 1331   Oral care mouth rinse  15 mL Mouth Rinse PRN Greta Doom, MD       phenylephrine (NEO-SYNEPHRINE) 20mg /NS 270mL premix infusion  25-200 mcg/min Intravenous Titrated Omar Person, NP       polyethylene glycol (MIRALAX / GLYCOLAX) packet 17 g  17 g Per Tube Daily Margaretmary Lombard, MD   17 g at 12/20/22 0933   rosuvastatin (CRESTOR) tablet 20 mg  20 mg Per Tube Daily Rosalin Hawking, MD   20 mg at 12/25/22 0940     Discharge Medications: Please see discharge summary for a list of discharge medications.  Relevant Imaging Results:  Relevant Lab Results:   Additional Information SSN: [id number]  [place], [place]

## 2022-12-25 NOTE — Progress Notes (Signed)
NAME:  Pamela Johnston, MRN:  [id number], DOB:  1941-03-08, LOS: 79 ADMISSION DATE:  11/29/2022, CONSULTATION DATE:  11/29/2022 REFERRING MD:  Leonel Ramsay - Neuro REASON FOR CONSULT: Ventilator management in setting of large IPH  History of Present Illness:  81-yer-old woman who presented to [facility] ED 12/21 as a Code Stroke. LKW 2000 when patient reportedly was in bed and became less responsive with +aphasia and R-sided weakness. One episode of nausea/vomiting en route with EMS. PMHx significant for HTN, chronic sacroiliitis (managed with Percocet), RLS, osteopenia.   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 Labs were grossly unremarkable with WBC mildly elevated to 12 and mild hyperglycemia to 154. Decision was made to intubate patient in ED after several episodes of emesis with concern for aspiration. HTS 3% was initiated.   Stroke service to admit. NSGY consulted with no plan for intervention at this time.   PCCM consulted for ventilator/hemodynamic management.   Pertinent Medical History:  Hypertension Sacroilititis Restless legs syndrome Osteopenia  Significant Hospital Events: Including procedures, antibiotic start and stop dates in addition to other pertinent events   12/21 - Presented to [facility] ED via EMS as Code Stroke. LKW 2000. Abrupt onset of decreased responsiveness, aphasia and R hemiplegia. Stroke admitting, NSGY consulted. PCCM consult for vent management. Treated with unasyn for aspiration pneumonia. CT Head with large L hemispheric parietal/temporal lobe IPH with extension into basal ganglia and IVH. HTS 3% started.   12/24 - Palliative care consulted, family requested meeting to be delayed 12/25 - Family requested goals of care meeting to be delayed 12/26 - Stopped  Unasyn, replete phos; [name] GOC conversation: family says full code 12/27 - Restarted Unasyn for fever, RLL infiltrate; resp culture: enterobacter, MDR. Palliative consulted  12/28 - CT Head unchanged 12/29 - Unasyn changed to bactrim 12/31 - Posturing, CT head with increased shift, increased bleeding 1/2 - Family fired CVA service after discussion RE poor prognosis  1/4 - Off sedation  1/5 - No acute issues overnight. Remains on vent with increased respirations off sedation. Fever curve slightly decreased  1/6 - Pall care meeting moved to 2pm today. Pamela Johnston the son-in-law will be present. On ven 30%/ On TF. Neo not started last night. MAP > 65 after fluids bolus. Febrile +.  Last CT head 12/09/22.  1/8 - No significant neurologic/clinical change. Brief GOC discussion with husband, Pamela Johnston, at bedside in AM. Concerns from staff re: patient GOC/decisionmaking, want to ensure husband is included in decisions/information sharing. Attempted bedside meeting but family prematurely ended meeting due to escalated discussion. 1/9 - Febrile overnight to 101.76F, remains on bromocriptine. No significant neurologic exam changes. 1/10 Family meeting yesterday with [name], plan for trach/peg though husband says they are still discussing this morning 1/11 no new issues, plan for PEG tomorrow 1/12 for trach/peg today 1/15 No change in Neuro status , trach and PEG, No weaning , will repeat CT head without, get Case management involved in looking for LTAC ( Day 3 post trach on 1/12)   1/15-CT with some stabilization  Interim History / Subjective:  Remains unresponsive, trach in place, not weaning T Max 100.4, No overnight events  Objective:   Blood pressure (!) 150/76, pulse (!) 104, temperature 99.6 F (37.6 C), temperature source Oral, resp. rate (!) 30, height 5\' 1"  (  1.549 m), weight 58.5 kg, SpO2 100 %.    Vent Mode: PSV;CPAP FiO2 (%):  [40 %] 40 % Set Rate:  [16 bmp] 16 bmp Vt Set:  [380 mL] 380  mL PEEP:  [5 cmH20] 5 cmH20 Pressure Support:  [10 cmH20] 10 cmH20 Plateau Pressure:  [14 cmH20-16 cmH20] 16 cmH20   Intake/Output Summary (Last 24 hours) at 12/25/2022 1037 Last data filed at 12/25/2022 0800 Gross per 24 hour  Intake 1350 ml  Output 880 ml  Net 470 ml   Filed Weights   12/15/22 0500 12/20/22 0400 12/25/22 0500  Weight: 59.7 kg 58.5 kg 58.5 kg    General: Chronically ill-appearing HEENT: Tracheostomy tube in place, Neuro: Does not open eyes, does not follow commands  CV: S1-S2 appreciated PULM: Clear breath sounds bilaterally GI: Bowel sounds appreciated, PEG in place Extremities: Warm and dry Skin: no rashes or lesions  Resolved Hospital Problem List:  Hypotension Hyperglycemia   Assessment & Plan:   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/mass effect Brain compression/ICH Right parietal lobe hemorrhage with associated edema Severe encephalopathy -Left parietal/temporal lobe extending to basal ganglia with IVH and mass effect, vasogenic edema, subfalcine herniation -Critical care is primary.  Stroke service signed off -Multiple family meetings concluded over time with CMO involved, medical decision maker is Pamela Johnston -Seizure/aspiration precautions -Neuroprotective measures, head of the bed elevation to 30 degrees, normoglycemia, normothermia Very poor prognosis with unlikely meaningful neurologic recovery -Palliative medicine following -Discharge planning to likely skilled nursing facility  Acute respiratory failure with hypoxia due to large ICH Treated for Enterobacter pneumonia-completed 10 days of Bactrim -Continue full vent support -Plateau pressures less than 30, head of the bed elevation to 30 degrees, -Bronchial hygiene  Low-grade fever -Did complete course of Bactrim -Negative DVT study -Cultures 1/13 with no growth -Started bromocriptine for central fever -Tracheal aspirate with no  growth-abundant gram-positive cocci in pairs, in clusters, moderate gram-negative rods-no specific organism so far  Urinary retention -Continue Urecholine  Circulatory shock-resolved -Maintain systolic goal less than 008 -Continue statin -Cardiac monitoring  Nutritional needs -PEG 1/12 -Continue tube feeding  Chronic sacroiliitis Osteopenia RLS - Supportive care  GOC 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  Anticipate severe long term disability without chance of independent function.  - PMT following, appreciate assistance with management - Family continues to desire full code status/full scope of care>> Husband who does have some dementia updated in full 1/15.  - Last PMT meeting 1/9 with CMO, continue full code and aggressive care  Best Practice (right click and "Reselect all SmartList Selections" daily)   Diet/type: tubefeeds DVT prophylaxis: SCD GI prophylaxis: H2B Lines: N/A Foley:  Yes, and it is still needed + urecholine (d/w RN) Code Status:  full code Last date of multidisciplinary goals of care discussion Pamela Johnston (son in law) 1/3] - pall meet 12/15/22 - full code.  Meeting with Doreen Salvage CMO 1/9 Called SIL, Pamela Johnston and updated in full 1/15, explained we are rechecking a CT Head today, and getting tracheal aspirate.   The patient is critically ill with multiple organ systems failure and requires high complexity decision making for assessment and support, frequent evaluation and titration of therapies, application of advanced monitoring technologies and extensive interpretation of multiple databases. Critical Care Time devoted to patient care services described in this note independent of APP/resident time (if applicable)  is 31 minutes.   Sherrilyn Rist MD [place] [facility] Personal pager: See Shea Evans  If unanswered, please page CCM On-call: [phone]

## 2022-12-25 NOTE — TOC Progression Note (Signed)
Transition of Care [facility]) - Progression Note    Patient Details  Name: Pamela Johnston MRN: [id number] Date of Birth: May 11, 1941  Transition of Care [facility]) CM/SW Contact  Ella Bodo, RN Phone Number: 12/25/2022, 10:00  Clinical Narrative:    Both area LTACs ([facility] psychotherapist) have declined admission for patient, given "poor prognosis/neuro status.  TOC CM/CSW will proceed with ventilator SNF placement.       Barriers to Discharge: Continued Medical Work up  Expected Discharge Plan and Services   Discharge Planning Services: CM Consult   Living arrangements for the past 2 months: Single Family Home                                       Social Determinants of Health (SDOH) Interventions    Readmission Risk Interventions     No data to display         Reinaldo Raddle, RN, BSN  Trauma/Neuro ICU Case Manager [phone]

## 2022-12-25 NOTE — Evaluation (Signed)
Physical Therapy Evaluation Patient Details Name: Pamela Johnston MRN: [id number] DOB: 10/09/1941 Today's Date: 12/25/2022  History of Present Illness  89-yer-old woman who presented to [facility] ED 12/21 as a Code Stroke with +aphasia and R-sided weakness. 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 Pt underwent trach and peg on 12/19/22. PMHx significant for HTN, chronic sacroiliitis (managed with Percocet), RLS, osteopenia.   Clinical Impression  Pt admitted with above. Aware pt with devastating stroke with poor prognosis however patient family is continuing to request aggressive care resulting in trach and peg on 12/19/2022. Pt will most likely need LTAC as pt with no command follow, no purposeful movement, posturing into extension with bilat UEs, and no eye opening. Pt is weaning off of vent however unsure of functional mobility prognosis. Acute PT to cont to follow 1x/wk on a trial basis or until family decides on different [place].        Recommendations for follow up therapy are one component of a multi-disciplinary discharge planning process, led by the attending physician.  Recommendations may be updated based on patient status, additional functional criteria and insurance authorization.  Follow Up Recommendations Long-term institutional care without follow-up therapy Can patient physically be transported by private vehicle: No    Assistance Recommended at Discharge Frequent or constant Supervision/Assistance  Patient can return home with the following   (unable to return home safely at this time)    Equipment Recommendations None recommended by PT  Recommendations for Other Services       Functional Status Assessment Patient has had a recent decline in their functional status and/or demonstrates limited ability to make significant improvements in function in a reasonable and  predictable amount of time     Precautions / Restrictions Precautions Precautions: Fall Precaution Comments: vent via trach Restrictions Weight Bearing Restrictions: No      Mobility  Bed Mobility Overal bed mobility: Needs Assistance Bed Mobility: Rolling, Supine to Sit Rolling: Total assist, +2 for physical assistance   Supine to sit: Total assist (used egress in bed)     General bed mobility comments: pt with no active movement or participation. Pt actively weaning off vent and was able to tolerate chair position in bed. RR increased to 38 however didn't sustain    Transfers                   General transfer comment: unable    Ambulation/Gait               General Gait Details: unable  Stairs            Wheelchair Mobility    Modified Rankin (Stroke Patients Only) Modified Rankin (Stroke Patients Only) Pre-Morbid Rankin Score: Moderately severe disability Modified Rankin: Severe disability     Balance Overall balance assessment: Needs assistance Sitting-balance support: Feet unsupported, No upper extremity supported Sitting balance-Leahy Scale: Zero Sitting balance - Comments: reliant on support of bed in egress position                                     Pertinent Vitals/Pain Pain Assessment Pain Assessment: Faces Faces Pain Scale: No hurt    Home Living Family/patient expects to be discharged to:: Unsure  Additional Comments: pt was at home with husband    Prior Function Prior Level of Function : Needs assist             Mobility Comments: per RN pt was w/c bound PTA ADLs Comments: per RN family assisted with all ADLs     Hand Dominance        Extremity/Trunk Assessment   Upper Extremity Assessment Upper Extremity Assessment: Difficult to assess due to impaired cognition (pt with posturing into extensor tone to pain and spontaneously t/o PT eval)    Lower Extremity  Assessment Lower Extremity Assessment: RLE deficits/detail;LLE deficits/detail RLE Deficits / Details: pt with limited knee extension due to contracture from being w/c bound, no active movement LLE Deficits / Details: pt with only 1/2 knee ROM due to flexion contracture from being w/c bound    Cervical / Trunk Assessment Cervical / Trunk Assessment: Other exceptions (trach)  Communication   Communication: Tracheostomy  Cognition Arousal/Alertness: Lethargic Behavior During Therapy: Flat affect Overall Cognitive Status: Impaired/Different from baseline                                 General Comments: pt with no opening of eyes to name, withdrawl to noxious stimuli x4 extremities but requires more pressue on R UE and LE. Pt with sluggish R pupil restriction to light, disconjucate gaze with manual assist to open eyes. no command follow.        General Comments General comments (skin integrity, edema, etc.): bilat LE edema, L forearem edema with noted redness around IV site    Exercises Other Exercises Other Exercises: PROM x4 extremities   Assessment/Plan    PT Assessment Patient needs continued PT services  PT Problem List Decreased strength;Decreased range of motion;Decreased activity tolerance;Decreased balance;Decreased mobility;Decreased coordination;Decreased cognition;Decreased knowledge of use of DME;Decreased safety awareness       PT Treatment Interventions DME instruction;Functional mobility training;Therapeutic activities;Therapeutic exercise;Balance training;Neuromuscular re-education;Cognitive remediation;Patient/family education    PT Goals (Current goals can be found in the Care Plan section)  Acute Rehab PT Goals Patient Stated Goal: didn't state PT Goal Formulation: Patient unable to participate in goal setting Time For Goal Achievement: 01/08/23 Potential to Achieve Goals: Poor    Frequency Min 1X/week (for trial)     Co-evaluation                AM-PAC PT "6 Clicks" Mobility  Outcome Measure Help needed turning from your back to your side while in a flat bed without using bedrails?: Total Help needed moving from lying on your back to sitting on the side of a flat bed without using bedrails?: Total Help needed moving to and from a bed to a chair (including a wheelchair)?: Total Help needed standing up from a chair using your arms (e.g., wheelchair or bedside chair)?: Total Help needed to walk in hospital room?: Total Help needed climbing 3-5 steps with a railing? : Total 6 Click Score: 6    End of Session   Activity Tolerance: Patient limited by lethargy Patient left: in bed;with call bell/phone within reach;with bed alarm set;with nursing/sitter in room Nurse Communication: Mobility status PT Visit Diagnosis: Difficulty in walking, not elsewhere classified (R26.2);Unsteadiness on feet (R26.81)    Time: 4098-1191 PT Time Calculation (min) (ACUTE ONLY): 13 min   Charges:   PT Evaluation $PT Eval Moderate Complexity: 1 Mod          Tunya Held  Reniyah Gootee, PT, DPT Acute Rehabilitation Services Secure chat preferred Office #: [phone]   Berline Lopes 12/25/2022, 2:39 PM

## 2022-12-26 ENCOUNTER — Inpatient Hospital Stay (HOSPITAL_COMMUNITY): Payer: Medicare PPO

## 2022-12-26 DIAGNOSIS — M461 Sacroiliitis, not elsewhere classified: Secondary | ICD-10-CM | POA: Diagnosis not present

## 2022-12-26 DIAGNOSIS — J150 Pneumonia due to Klebsiella pneumoniae: Secondary | ICD-10-CM | POA: Diagnosis not present

## 2022-12-26 DIAGNOSIS — I619 Nontraumatic intracerebral hemorrhage, unspecified: Secondary | ICD-10-CM | POA: Diagnosis not present

## 2022-12-26 DIAGNOSIS — J9601 Acute respiratory failure with hypoxia: Secondary | ICD-10-CM | POA: Diagnosis not present

## 2022-12-26 LAB — GLUCOSE, CAPILLARY
Glucose-Capillary: 119 mg/dL — ABNORMAL HIGH (ref 70–99)
Glucose-Capillary: 120 mg/dL — ABNORMAL HIGH (ref 70–99)
Glucose-Capillary: 103 mg/dL — ABNORMAL HIGH (ref 70–99)
Glucose-Capillary: 142 mg/dL — ABNORMAL HIGH (ref 70–99)
Glucose-Capillary: 133 mg/dL — ABNORMAL HIGH (ref 70–99)
Glucose-Capillary: 111 mg/dL — ABNORMAL HIGH (ref 70–99)

## 2022-12-26 LAB — COMPREHENSIVE METABOLIC PANEL WITH GFR
ALT: 91 U/L — ABNORMAL HIGH (ref 0–44)
AST: 77 U/L — ABNORMAL HIGH (ref 15–41)
Albumin: 1.7 g/dL — ABNORMAL LOW (ref 3.5–5.0)
Alkaline Phosphatase: 86 U/L (ref 38–126)
Anion gap: 8 (ref 5–15)
BUN: 23 mg/dL (ref 8–23)
CO2: 30 mmol/L (ref 22–32)
Calcium: 8 mg/dL — ABNORMAL LOW (ref 8.9–10.3)
Chloride: 103 mmol/L (ref 98–111)
Creatinine, Ser: 0.4 mg/dL — ABNORMAL LOW (ref 0.44–1.00)
GFR, Estimated: >60 mL/min
Glucose, Bld: 125 mg/dL — ABNORMAL HIGH (ref 70–99)
Potassium: 3.2 mmol/L — ABNORMAL LOW (ref 3.5–5.1)
Sodium: 141 mmol/L (ref 135–145)
Total Bilirubin: 0.4 mg/dL (ref 0.3–1.2)
Total Protein: 5 g/dL — ABNORMAL LOW (ref 6.5–8.1)

## 2022-12-26 MED ORDER — POTASSIUM CHLORIDE 20 MEQ PO PACK
40.0000 meq | PACK | ORAL | Status: AC
  Administered 2022-12-26 (×2): 40 meq
  Filled 2022-12-26 (×2): qty 2

## 2022-12-26 MED ORDER — LABETALOL HCL 5 MG/ML IV SOLN: 10.0000 mg | INTRAVENOUS | Status: AC | PRN

## 2022-12-26 MED ORDER — DEXMEDETOMIDINE HCL IN NACL 400 MCG/100ML IV SOLN: 0.0000 ug/kg/h | INTRAVENOUS | Status: AC

## 2022-12-26 MED ORDER — SODIUM CHLORIDE 0.9 % IV SOLN
2.0000 g | Freq: Two times a day (BID) | INTRAVENOUS | Status: DC
  Administered 2022-12-26: 2 g via INTRAVENOUS
  Filled 2022-12-26: qty 12.5

## 2022-12-26 MED ORDER — SODIUM CHLORIDE 0.9 % IV SOLN
1.0000 g | Freq: Two times a day (BID) | INTRAVENOUS | Status: AC
  Administered 2022-12-26 – 2022-12-30 (×10): 1 g via INTRAVENOUS
  Filled 2022-12-26 (×10): qty 20

## 2022-12-26 NOTE — Progress Notes (Signed)
[place] Progress Note Patient Name: Pamela Johnston DOB: 12-24-1940 MRN: [id number]   Date of Service  12/26/2022  HPI/Events of Note  Patient with tachypnea, tachycardia and elevated BP, no desaturation, she is on no continuous sedation and PRN Versed and Fentanyl which have calmed her down in the past had no apparent effect.One possibility  is that she is becoming more wakeful from her head bleed and is reacting to increased awareness of discomfort, however a respiratory issue needs to be ruled out, bladder scan was negative for urinary retention.  eICU Interventions  Stat portable CXR ordered to r/o a respiratory issue, will start patient on a low dose Precedex gtt, PRN Labetalol for hypertension.        Frederik Pear 12/26/2022, 9:29 PM

## 2022-12-26 NOTE — TOC Progression Note (Addendum)
Transition of Care [facility]) - Progression Note    Patient Details  Name: Pamela Johnston MRN: [id number] Date of Birth: 1941-01-22  Transition of Care [facility]) CM/SW [place] Phone Number: 12/26/2022, 12:47 PM  Clinical Narrative:    Per [facility] SNF, they are unable to consider patient until her tracheostomy is 88 days old. Awaiting response from [place].   CSW received request to assist patient's spouse with bus route info. CSW printed info on routes with map screen shots and the bus depot price info and provided a few bus passes. Packet placed in patient's room for spouse who is no longer at bedside.    Expected Discharge Plan: Skilled Nursing Facility Barriers to Discharge: Ship broker, SNF Pending bed offer  Expected Discharge Plan and Services In-house Referral: Clinical Social Work Discharge Planning Services: CM Consult Post Acute Care Choice: [place] arrangements for the past 2 months: Single Family Home                                       Social Determinants of Health (SDOH) Interventions    Readmission Risk Interventions     No data to display

## 2022-12-26 NOTE — Progress Notes (Signed)
RT responded to RN call about pr becoming more tachypnic. Pt RR typically 20-30 and now staying 35-38. RT passed the suction catheter easily and suctioned small thick yellow secretions. SpO2 98-99.

## 2022-12-26 NOTE — Progress Notes (Addendum)
NAME:  Pamela Johnston, MRN:  [id number], DOB:  1941/09/08, LOS: 48 ADMISSION DATE:  11/29/2022, CONSULTATION DATE:  11/29/2022 REFERRING MD:  Leonel Ramsay - Neuro REASON FOR CONSULT: Ventilator management in setting of large IPH  History of Present Illness:  81-yer-old woman who presented to [facility] ED 12/21 as a Code Stroke. LKW 2000 when patient reportedly was in bed and became less responsive with +aphasia and R-sided weakness. One episode of nausea/vomiting en route with EMS. PMHx significant for HTN, chronic sacroiliitis (managed with Percocet), RLS, osteopenia.   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 Labs were grossly unremarkable with WBC mildly elevated to 12 and mild hyperglycemia to 154. Decision was made to intubate patient in ED after several episodes of emesis with concern for aspiration. HTS 3% was initiated.   Stroke service to admit. NSGY consulted with no plan for intervention at this time.   PCCM consulted for ventilator/hemodynamic management.   Pertinent Medical History:  Hypertension Sacroilititis Restless legs syndrome Osteopenia  Significant Hospital Events: Including procedures, antibiotic start and stop dates in addition to other pertinent events   12/21 - Presented to [facility] ED via EMS as Code Stroke. LKW 2000. Abrupt onset of decreased responsiveness, aphasia and R hemiplegia. Stroke admitting, NSGY consulted. PCCM consult for vent management. Treated with unasyn for aspiration pneumonia. CT Head with large L hemispheric parietal/temporal lobe IPH with extension into basal ganglia and IVH. HTS 3% started.   12/24 - Palliative care consulted, family requested meeting to be delayed 12/25 - Family requested goals of care meeting to be delayed 12/26 - Stopped  Unasyn, replete phos; [name] GOC conversation: family says full code 12/27 - Restarted Unasyn for fever, RLL infiltrate; resp culture: enterobacter, MDR. Palliative consulted  12/28 - CT Head unchanged 12/29 - Unasyn changed to bactrim 12/31 - Posturing, CT head with increased shift, increased bleeding 1/2 - Family fired CVA service after discussion RE poor prognosis  1/4 - Off sedation  1/5 - No acute issues overnight. Remains on vent with increased respirations off sedation. Fever curve slightly decreased  1/6 - Pall care meeting moved to 2pm today. [name] the son-in-law will be present. On ven 30%/ On TF. Neo not started last night. MAP > 65 after fluids bolus. Febrile +.  Last CT head 12/09/22.  1/8 - No significant neurologic/clinical change. Brief GOC discussion with husband, [name], at bedside in AM. Concerns from staff re: patient GOC/decisionmaking, want to ensure husband is included in decisions/information sharing. Attempted bedside meeting but family prematurely ended meeting due to escalated discussion. 1/9 - Febrile overnight to 101.9F, remains on bromocriptine. No significant neurologic exam changes. 1/10 Family meeting yesterday with [name], plan for trach/peg though husband says they are still discussing this morning 1/11 no new issues, plan for PEG tomorrow 1/12 for trach/peg today 1/15 No change in Neuro status , trach and PEG, No weaning , will repeat CT head without, get Case management involved in looking for LTAC ( Day 3 post trach on 1/12)   1/15-CT with some stabilization  Interim History / Subjective:  Remains unresponsive No overnight events Weaning at 10/5 Tmax of 100 Chronically ill-appearing  Objective:   Blood pressure (!) 114/52, pulse 85, temperature 99.5 F (37.5 C), temperature source Axillary, resp. rate (!) 36, height 5\' 1"  (1.549  m), weight 58.5 kg, SpO2 98 %.    Vent Mode: CPAP FiO2 (%):  [40 %] 40 % Set Rate:  [16 bmp] 16 bmp Vt Set:  [380 mL]  380 mL PEEP:  [5 cmH20] 5 cmH20 Pressure Support:  [10 cmH20] 10 cmH20 Plateau Pressure:  [16 cmH20-18 cmH20] 16 cmH20   Intake/Output Summary (Last 24 hours) at 12/26/2022 1517 Last data filed at 12/26/2022 0800 Gross per 24 hour  Intake 1370 ml  Output 1750 ml  Net -380 ml   Filed Weights   12/20/22 0400 12/25/22 0500 12/26/22 0500  Weight: 58.5 kg 58.5 kg 58.5 kg    General: Chronically ill-appearing HEENT: Tracheostomy tube in place, moist oral mucosa Neuro: Does not open eyes to commands, does not follow commands CV: S1-S2 appreciated PULM: Clear breath sounds bilaterally GI: Bowel sounds appreciated, PEG in place  Extremities: Warm and dry Skin: no rashes or lesions  Respiratory culture showing Enterobacter, Pseudomonas, Staphylococcus   Resolved Hospital Problem List:  Hypotension Hyperglycemia   Assessment & Plan:   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/mass effect Brain compression/ICH Right parietal lobe hemorrhage with associated edema Severe encephalopathy -Intraventricular hemorrhage and mass effect, vasogenic edema, subfalcine herniation -Stroke team did sign off -Multiple family meetings concluded over time with CMO in bold, medical decision maker is [name] patient's son-in-law -Continue seizure and aspiration precautions -Neuroprotective measures, elevation of the head of the bed to 30 degrees, normoglycemia, normothermia Prognosis remains very poor Appreciate palliative medicine following Discharge planning likely to skilled nursing facility  Acute respiratory failure with hypoxia due to large intracerebral hemorrhage Completed 10 days of treatment for Enterobacter pneumonia Continue full vent support Continue bronchial hygiene Tracheal secretions recently showing Enterobacter, Pseudomonas -Will start cefepime - weaning on 10/5  Low-grade fever -Did complete Bactrim -Negative DVT  study -Cultures 1/13 with no growth -Tracheal aspirate showing Pseudomonas, Enterobacter -Start cefepime  Urinary retention -Tried on Urecholine for 3 days -Discontinue today  Circulatory shock -Resolved -Maintain systolic goal less than 160 -Continue statin -Continue cardiac monitoring  Nutritional needs -Continue tube feeding  Chronic sacroiliitis Osteopenia RLS -Supportive care   GOC 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  Anticipate severe long term disability without chance of independent function.  - PMT following, appreciate assistance with management - Family continues to desire full code status/full scope of care>> Husband who does have some dementia updated in full 1/15.  - Last PMT meeting 1/9 with CMO, continue full code and aggressive care  Best Practice (right click and "Reselect all SmartList Selections" daily)   Diet/type: tubefeeds DVT prophylaxis: SCD GI prophylaxis: H2B Lines: N/A Foley:  Yes, and it is still needed + urecholine (d/w RN) Code Status:  full code Last date of multidisciplinary goals of care discussion Theodoro Grist (son in law) 1/3] - pall meet 12/15/22 - full code.  Meeting with Frederik Schmidt CMO 1/9 Called SIL, [name] and updated in full 1/15, explained we are rechecking a CT Head today, and getting tracheal aspirate.   The patient is critically ill with multiple organ systems failure and requires high complexity decision making for assessment and support, frequent evaluation and titration of therapies, application of advanced monitoring technologies and extensive interpretation of multiple databases. Critical Care Time devoted to patient care services described in this note independent of APP/resident time (if applicable)  is 32 minutes.   Virl Diamond MD [place] [facility] Personal pager: See Amion If unanswered, please page  CCM On-call: [phone]

## 2022-12-27 DIAGNOSIS — J9601 Acute respiratory failure with hypoxia: Secondary | ICD-10-CM | POA: Diagnosis not present

## 2022-12-27 DIAGNOSIS — R0902 Hypoxemia: Secondary | ICD-10-CM | POA: Diagnosis not present

## 2022-12-27 DIAGNOSIS — I618 Other nontraumatic intracerebral hemorrhage: Secondary | ICD-10-CM | POA: Diagnosis not present

## 2022-12-27 DIAGNOSIS — G935 Compression of brain: Secondary | ICD-10-CM | POA: Diagnosis not present

## 2022-12-27 DIAGNOSIS — J150 Pneumonia due to Klebsiella pneumoniae: Secondary | ICD-10-CM | POA: Diagnosis not present

## 2022-12-27 LAB — COMPREHENSIVE METABOLIC PANEL
ALT: 87 U/L — ABNORMAL HIGH (ref 0–44)
AST: 78 U/L — ABNORMAL HIGH (ref 15–41)
Albumin: 2 g/dL — ABNORMAL LOW (ref 3.5–5.0)
Alkaline Phosphatase: 111 U/L (ref 38–126)
Anion gap: 11 (ref 5–15)
BUN: 26 mg/dL — ABNORMAL HIGH (ref 8–23)
CO2: 26 mmol/L (ref 22–32)
Calcium: 8.5 mg/dL — ABNORMAL LOW (ref 8.9–10.3)
Chloride: 105 mmol/L (ref 98–111)
Creatinine, Ser: 0.51 mg/dL (ref 0.44–1.00)
GFR, Estimated: >60 mL/min (ref >60)
Glucose, Bld: 119 mg/dL — ABNORMAL HIGH (ref 70–99)
Potassium: 4.3 mmol/L (ref 3.5–5.1)
Sodium: 142 mmol/L (ref 135–145)
Total Bilirubin: 0.4 mg/dL (ref 0.3–1.2)
Total Protein: 6 g/dL — ABNORMAL LOW (ref 6.5–8.1)

## 2022-12-27 LAB — GLUCOSE, CAPILLARY
Glucose-Capillary: 138 mg/dL — ABNORMAL HIGH (ref 70–99)
Glucose-Capillary: 133 mg/dL — ABNORMAL HIGH (ref 70–99)
Glucose-Capillary: 112 mg/dL — ABNORMAL HIGH (ref 70–99)
Glucose-Capillary: 124 mg/dL — ABNORMAL HIGH (ref 70–99)
Glucose-Capillary: 128 mg/dL — ABNORMAL HIGH (ref 70–99)
Glucose-Capillary: 113 mg/dL — ABNORMAL HIGH (ref 70–99)

## 2022-12-27 LAB — CULTURE, BLOOD (ROUTINE X 2)
Culture: NO GROWTH
Culture: NO GROWTH
Special Requests: ADEQUATE

## 2022-12-27 LAB — MAGNESIUM: Magnesium: 2.2 mg/dL (ref 1.7–2.4)

## 2022-12-27 LAB — PHOSPHORUS: Phosphorus: 3 mg/dL (ref 2.5–4.6)

## 2022-12-27 LAB — CULTURE, RESPIRATORY W GRAM STAIN

## 2022-12-27 MED ORDER — OSMOLITE 1.2 CAL PO LIQD
1000.0000 mL | ORAL | Status: DC
  Administered 2022-12-27 – 2023-01-01 (×6): 1000 mL

## 2022-12-27 MED ORDER — PROSOURCE TF20 ENFIT COMPATIBL EN LIQD
60.0000 mL | Freq: Every day | ENTERAL | Status: DC
  Administered 2022-12-27 – 2023-02-27 (×63): 60 mL
  Filled 2022-12-27 (×63): qty 60

## 2022-12-27 MED ORDER — BANATROL TF EN LIQD
60.0000 mL | Freq: Two times a day (BID) | ENTERAL | Status: DC
  Administered 2022-12-27 – 2023-01-02 (×13): 60 mL
  Filled 2022-12-27 (×13): qty 60

## 2022-12-27 MED ORDER — POLYETHYLENE GLYCOL 3350 17 G PO PACK: 17.0000 g | PACK | Freq: Every day | ORAL | Status: DC | PRN

## 2022-12-27 MED ORDER — DOCUSATE SODIUM 50 MG/5ML PO LIQD: 100.0000 mg | Freq: Two times a day (BID) | ORAL | Status: DC | PRN

## 2022-12-27 NOTE — TOC Progression Note (Signed)
Transition of Care [facility]) - Progression Note    Patient Details  Name: Pamela Johnston MRN: [id number] Date of Birth: 01-Oct-1941  Transition of Care [facility]) CM/SW [place], LCSW Phone Number: 12/27/2022, 4:31 PM  Clinical Narrative:    CSW contacted [facility] to follow up on the referral. Danae Chen stated that patient will need to have Medicaid in process before they could accept her as Mcarthur Rossetti will only cover two weeks maximum. CSW will reach out to family and financial counseling for assistance.    Expected Discharge Plan: Skilled Nursing Facility Barriers to Discharge: Ship broker, SNF Pending bed offer  Expected Discharge Plan and Services In-house Referral: Clinical Social Work Discharge Planning Services: CM Consult Post Acute Care Choice: [place] arrangements for the past 2 months: Single Family Home                                       Social Determinants of Health (SDOH) Interventions    Readmission Risk Interventions     No data to display

## 2022-12-27 NOTE — Progress Notes (Signed)
Nutrition Follow-up  DOCUMENTATION CODES:   Not applicable  INTERVENTION:   Tube feeding via PEG tube   D/C Vital 1.2  Osmolite 1.5 @ 55 ml/h (1320 ml per day) 60 ml ProSource TF daily  Provides 1664 kcal, 102 gm protein, 1070 ml free water daily  Banatrol BID  NUTRITION DIAGNOSIS:   Inadequate oral intake related to inability to eat as evidenced by NPO status. Ongoing.   GOAL:   Provide needs based on ASPEN/SCCM guidelines Met with TF at goal   MONITOR:   Vent status, TF tolerance, Labs, Weight trends  REASON FOR ASSESSMENT:   Consult Enteral/tube feeding initiation and management  ASSESSMENT:   82 year old female with PMHx of HTN, osteopenia, RLS, sacroiliitis admitted with large left hemispheric parietal/temporal IPH with vasogenic edema/mass effect, also with acute respiratory failure and concern for aspiration PNA given emesis per-intubation.  Pt discussed during ICU rounds and with RN.  TOC working on d/c plan for vent/SNF Pt weaning, overnight had tachypnea/tachycardia and elevated BP started on Precedex Off pressors   01/12 - s/p trach/PEG   Current wt: 58.5 kg  Admission wt: 57.8 kg   Medications reviewed and include: colace, pepcid, miralax  Fentanyl  Precedex  Labs reviewed CBG's: 103-142   Diet Order:   Diet Order             Diet NPO time specified  Diet effective midnight                   EDUCATION NEEDS:   Not appropriate for education at this time  Skin:  Skin Assessment: Skin Integrity Issues: Skin Integrity Issues:: Stage I Stage I: R ear Stage II: nose  Last BM:  560 ml via FMS  Height:   Ht Readings from Last 1 Encounters:  11/29/22 5\' 1"  (1.549 m)    Weight:   Wt Readings from Last 1 Encounters:  12/26/22 58.5 kg    Ideal Body Weight:  47.7 kg  BMI:  Body mass index is 24.37 kg/m.  Estimated Nutritional Needs:   Kcal:  1500-1700  Protein:  80-100 grams  Fluid:  1.5-1.7 L/day  Lockie Pares.,  RD, LDN, CNSC See AMiON for contact information

## 2022-12-27 NOTE — Progress Notes (Signed)
NAME:  Pamela Johnston, MRN:  [id number], DOB:  1940-12-23, LOS: 52 ADMISSION DATE:  11/29/2022, CONSULTATION DATE:  11/29/2022 REFERRING MD:  Leonel Ramsay - Neuro REASON FOR CONSULT: Ventilator management in setting of large IPH  History of Present Illness:  81-yer-old woman who presented to [facility] ED 12/21 as a Code Stroke. LKW 2000 when patient reportedly was in bed and became less responsive with +aphasia and R-sided weakness. One episode of nausea/vomiting en route with EMS. PMHx significant for HTN, chronic sacroiliitis (managed with Percocet), RLS, osteopenia.   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 Labs were grossly unremarkable with WBC mildly elevated to 12 and mild hyperglycemia to 154. Decision was made to intubate patient in ED after several episodes of emesis with concern for aspiration. HTS 3% was initiated.   Stroke service to admit. NSGY consulted with no plan for intervention at this time.   PCCM consulted for ventilator/hemodynamic management.   Pertinent Medical History:  Hypertension Sacroilititis Restless legs syndrome Osteopenia  Significant Hospital Events: Including procedures, antibiotic start and stop dates in addition to other pertinent events   12/21 - Presented to [facility] ED via EMS as Code Stroke. LKW 2000. Abrupt onset of decreased responsiveness, aphasia and R hemiplegia. Stroke admitting, NSGY consulted. PCCM consult for vent management. Treated with unasyn for aspiration pneumonia. CT Head with large L hemispheric parietal/temporal lobe IPH with extension into basal ganglia and IVH. HTS 3% started.   12/24 - Palliative care consulted, family requested meeting to be delayed 12/25 - Family requested goals of care meeting to be delayed 12/26 - Stopped  Unasyn, replete phos; [name] GOC conversation: family says full code 12/27 - Restarted Unasyn for fever, RLL infiltrate; resp culture: enterobacter, MDR. Palliative consulted  12/28 - CT Head unchanged 12/29 - Unasyn changed to bactrim 12/31 - Posturing, CT head with increased shift, increased bleeding 1/2 - Family fired CVA service after discussion RE poor prognosis  1/4 - Off sedation  1/5 - No acute issues overnight. Remains on vent with increased respirations off sedation. Fever curve slightly decreased  1/6 - Pall care meeting moved to 2pm today. [name] the son-in-law will be present. On ven 30%/ On TF. Neo not started last night. MAP > 65 after fluids bolus. Febrile +.  Last CT head 12/09/22.  1/8 - No significant neurologic/clinical change. Brief GOC discussion with husband, [name], at bedside in AM. Concerns from staff re: patient GOC/decisionmaking, want to ensure husband is included in decisions/information sharing. Attempted bedside meeting but family prematurely ended meeting due to escalated discussion. 1/9 - Febrile overnight to 101.68F, remains on bromocriptine. No significant neurologic exam changes. 1/10 Family meeting yesterday with [name], plan for trach/peg though husband says they are still discussing this morning 1/11 no new issues, plan for PEG tomorrow 1/12 for trach/peg today 1/15 No change in Neuro status , trach and PEG, No weaning , will repeat CT head without, get Case management involved in looking for LTAC ( Day 3 post trach on 1/12)   1/15-CT with some stabilization  Interim History / Subjective:   Remains unresponsive, did have tachypnea, tachycardia with elevated BP overnight Settle down from this Tmax 99.7  Objective:   Blood pressure 123/63, pulse 96, temperature 98.3 F (36.8 C), temperature source Oral, resp. rate (!) 22, height  5\' 1"  (1.549 m), weight 58.5 kg, SpO2 100 %.    Vent Mode: CPAP;PSV FiO2 (%):  [40 %] 40 % Set Rate:  [16 bmp] 16 bmp Vt  Set:  [38 mL-380 mL] 380 mL PEEP:  [5 cmH20] 5 cmH20 Pressure Support:  [10 cmH20] 10 cmH20 Plateau Pressure:  [12 cmH20-14 cmH20] 14 cmH20   Intake/Output Summary (Last 24 hours) at 12/27/2022 0850 Last data filed at 12/27/2022 0800 Gross per 24 hour  Intake 1800.12 ml  Output 1220 ml  Net 580.12 ml   Filed Weights   12/20/22 0400 12/25/22 0500 12/26/22 0500  Weight: 58.5 kg 58.5 kg 58.5 kg    General: Chronically ill-appearing HEENT: Tracheostomy tube in place, moist oral mucosa Neuro: Does not open eyes to commands, not following commands CV: S1-S2 appreciated PULM: Clear breath sounds bilaterally GI: PEG in place, bowel sounds appreciated Extremities: Skin is warm and dry Skin: no rashes or lesions  Respiratory culture showing Enterobacter, Pseudomonas, Staphylococcus  Cefepime 1/18. Meropenem 1/18>>  Chest x-ray 12/26/2022 2151 hrs. reviewed by myself showing no acute infiltrative process  Resolved Hospital Problem List:  Hypotension Hyperglycemia   Assessment & Plan:   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/mass effect Brain compression/ICH Right parietal lobe hemorrhage with associated edema  severe encephalopathy -Intraventricular hemorrhage and mass effect, vasogenic edema, subfalcine herniation -Stroke team signed off -Multiple family meetings concluded over time with CMO on board, medical decision maker is [name] patient's son-in-law -Continue seizure and aspiration precautions Neuroprotective measures, elevation of the head of the bed to 30 degrees, normoglycemia, normothermia Prognosis remains very poor Appreciate palliative medicine following Discharge planning to skilled facility  Acute respiratory failure with hypoxia due to large intracerebral hemorrhage Completed 10 days of treatment for Enterobacter pneumonia Recent culture showing Enterobacter and Pseudomonas -Started on meropenem -Continue  bronchial hygiene -Continue weaning as tolerated  Fever appears to have resolved -Negative DVT study  Urinary retention -Was on Urecholine for 3 days -Foley was taken out 12/26/2022 -Bladder scan overnight did not show urinary retention  Circulatory shock -Maintain systolic goal less than 824 -Continue statin -Continue cardiac monitoring  Continue tube feeding for nutritional needs  History of chronic sacroiliitis Osteopenia RLS -Supportive care  GOC 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  Anticipate severe long term disability without chance of independent function.  - PMT following, appreciate assistance with management - Family continues to desire full code status/full scope of care>> Husband who does have some dementia updated in full 1/15.  - Last PMT meeting 1/9 with CMO, continue full code and aggressive care  Best Practice (right click and "Reselect all SmartList Selections" daily)   Diet/type: tubefeeds DVT prophylaxis: SCD GI prophylaxis: H2B Lines: N/A Foley:  N/A Code Status:  full code Last date of multidisciplinary goals of care discussion [name] (son in law) 1/3] - pall meet 12/15/22 - full code.  Meeting with Doreen Salvage CMO 1/9 Called SIL, [name] and updated in full 1/15  The patient is critically ill with multiple organ systems failure and requires high complexity decision making for assessment and support, frequent evaluation and titration of therapies, application of advanced monitoring technologies and extensive interpretation of multiple databases. Critical Care Time devoted to patient care services described in this note independent of APP/resident time (if applicable)  is 31 minutes.   Sherrilyn Rist MD [facility] Personal pager: See Amion If unanswered, please page CCM On-call: [phone]

## 2022-12-28 DIAGNOSIS — J9601 Acute respiratory failure with hypoxia: Secondary | ICD-10-CM | POA: Diagnosis not present

## 2022-12-28 LAB — GLUCOSE, CAPILLARY
Glucose-Capillary: 134 mg/dL — ABNORMAL HIGH (ref 70–99)
Glucose-Capillary: 118 mg/dL — ABNORMAL HIGH (ref 70–99)
Glucose-Capillary: 126 mg/dL — ABNORMAL HIGH (ref 70–99)
Glucose-Capillary: 157 mg/dL — ABNORMAL HIGH (ref 70–99)
Glucose-Capillary: 142 mg/dL — ABNORMAL HIGH (ref 70–99)
Glucose-Capillary: 121 mg/dL — ABNORMAL HIGH (ref 70–99)

## 2022-12-28 NOTE — TOC Progression Note (Signed)
Transition of Care [facility]) - Progression Note    Patient Details  Name: Pamela Johnston MRN: [id number] Date of Birth: December 07, 1941  Transition of Care [facility]) CM/SW [place], [place] Phone Number: 12/28/2022, 3:26 PM  Clinical Narrative:    CSW spoke with patient's son in law and daughter who are in [place] right now due to the weather but are planning on coming back to town as soon as they can. CSW provided update on Vent SNF process and requested that they follow up on applying for Medicaid for patient as requested by [facility]. They agreed and CSW provided [facility] Social [employer] representative Counseling to see if they can assist family with the process. Daughter shared that it is very important for patient's spouse to be able to visit patient and so they are hopeful patient can be placed at [facility] since it is closer and he does not drive. CSW explained that it will depend on who has a bed available first as we will need to accept the first reasonable bed offer. They reported understanding.   CSW inquired as to how patient's spouse is doing and daughter reported he is doing well though does have some memory issues at times. CSW inquired as to who is checking on him since they are not here and they stated that she and her brother (in [place]) check on him several times a day and have uber rides scheduled for him twice a day to and from the hospital. No apparent needs at this time for spouse.    Expected Discharge Plan: Skilled Nursing Facility Barriers to Discharge: Ship broker, SNF Pending bed offer  Expected Discharge Plan and Services In-house Referral: Clinical Social Work Discharge Planning Services: CM Consult Post Acute Care Choice: [place] arrangements for the past 2 months: Single Family Home                                       Social Determinants of Health (SDOH) Interventions     Readmission Risk Interventions     No data to display

## 2022-12-28 NOTE — Progress Notes (Addendum)
NAME:  Pamela Johnston, MRN:  [id number], DOB:  1941/08/05, LOS: 59 ADMISSION DATE:  11/29/2022, CONSULTATION DATE:  11/29/2022 REFERRING MD:  Leonel Ramsay - Neuro REASON FOR CONSULT: Ventilator management in setting of large IPH  History of Present Illness:  81-yer-old woman who presented to [facility] ED 12/21 as a Code Stroke. LKW 2000 when patient reportedly was in bed and became less responsive with +aphasia and R-sided weakness. One episode of nausea/vomiting en route with EMS. PMHx significant for HTN, chronic sacroiliitis (managed with Percocet), RLS, osteopenia.   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 Labs were grossly unremarkable with WBC mildly elevated to 12 and mild hyperglycemia to 154. Decision was made to intubate patient in ED after several episodes of emesis with concern for aspiration. HTS 3% was initiated.   Stroke service to admit. NSGY consulted with no plan for intervention at this time.   PCCM consulted for ventilator/hemodynamic management.   Pertinent Medical History:  Hypertension Sacroilititis Restless legs syndrome Osteopenia  Significant Hospital Events: Including procedures, antibiotic start and stop dates in addition to other pertinent events   12/21 - Presented to [facility] ED via EMS as Code Stroke. LKW 2000. Abrupt onset of decreased responsiveness, aphasia and R hemiplegia. Stroke admitting, NSGY consulted. PCCM consult for vent management. Treated with unasyn for aspiration pneumonia. CT Head with large L hemispheric parietal/temporal lobe IPH with extension into basal ganglia and IVH. HTS 3% started.   12/24 - Palliative care consulted, family requested meeting to be delayed 12/25 - Family requested goals of care meeting to be delayed 12/26 - Stopped  Unasyn, replete phos; [name] GOC conversation: family says full code 12/27 - Restarted Unasyn for fever, RLL infiltrate; resp culture: enterobacter, MDR. Palliative consulted  12/28 - CT Head unchanged 12/29 - Unasyn changed to bactrim 12/31 - Posturing, CT head with increased shift, increased bleeding 1/2 - Family fired CVA service after discussion RE poor prognosis  1/4 - Off sedation  1/5 - No acute issues overnight. Remains on vent with increased respirations off sedation. Fever curve slightly decreased  1/6 - Pall care meeting moved to 2pm today. Pamela Johnston the son-in-law will be present. On ven 30%/ On TF. Neo not started last night. MAP > 65 after fluids bolus. Febrile +.  Last CT head 12/09/22.  1/8 - No significant neurologic/clinical change. Brief GOC discussion with husband, Pamela Johnston, at bedside in AM. Concerns from staff re: patient GOC/decisionmaking, want to ensure husband is included in decisions/information sharing. Attempted bedside meeting but family prematurely ended meeting due to escalated discussion. 1/9 - Febrile overnight to 101.53F, remains on bromocriptine. No significant neurologic exam changes. 1/10 Family meeting yesterday with [name], plan for trach/peg though husband says they are still discussing this morning. Per Dt. Hulen Skains " So moving forward, Pamela Johnston is the designated medical decision maker for Pamela Johnston " 1/11 no new issues, plan for PEG tomorrow 1/12 for trach/peg today 1/15 No change in Neuro status , trach and PEG, No weaning , will repeat CT head without, get Case management involved in looking for LTAC ( Day 3 post trach on 1/12)   1/15-CT with some stabilization 1/19 No acute events overnight, respiratory culture 1/15 with abundant Pseudomonas, abundant Staphylococcus aureus, and moderate Enterobacter remains on Meropenem   Interim History / Subjective:  Seen on vent via trach, unresponsive  T-max 101 overnight  Foley cath placed overnight for  retention   Objective:   Blood pressure 132/64, pulse 93, temperature 99.5 F (37.5 C), temperature source Oral, resp. rate (!) 27, height 5\' 1"  (1.549 m), weight 58.5 kg, SpO2 100 %.    Vent Mode: PRVC FiO2 (%):  [40 %] 40 % Set Rate:  [16 bmp] 16 bmp Vt Set:  [380 mL] 380 mL PEEP:  [5 cmH20] 5 cmH20 Pressure Support:  [10 cmH20] 10 cmH20   Intake/Output Summary (Last 24 hours) at 12/28/2022 0801 Last data filed at 12/28/2022 0700 Gross per 24 hour  Intake 1465.99 ml  Output 1285 ml  Net 180.99 ml    Filed Weights   12/20/22 0400 12/25/22 0500 12/26/22 0500  Weight: 58.5 kg 58.5 kg 58.5 kg   Physical Exam  General: Acute on chronically ill appearing deconditioned elderly female lying in bed on mechanical ventilation via trach, in NAD HEENT: 6 cuffed trach midline, MM pink/moist, PERRL,  Neuro: Unresponsive on vent  CV: tachycardia s1s2 regular rate and rhythm, no murmur, rubs, or gallops,  PULM: Rhonchi bilaterally, tachypnea, no increased work of breathing, tolerating vent,  GI: soft, bowel sounds active in all 4 quadrants, non-tender, non-distended, tolerating TF Extremities: warm/dry, no edema  Skin: no rashes or lesions  Resolved Hospital Problem List:  Hypotension Hyperglycemia  Enterobacter pneumonia -Completed 10 days of treatment for  Circulatory shock  Assessment & Plan:   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/mass effect Brain compression/ICH Right parietal lobe hemorrhage with associated edema  Severe encephalopathy Intraventricular hemorrhage and mass effect, vasogenic edema, subfalcine herniation -Multiple family meetings concluded over time with CMO on board, medical decision maker is 12/28/22 patient's son-in-law -Stroke team signed off P: Maintain neuro protective measures Nutrition and bowel regiment  Seizure precautions  Aspirations precautions   Acute respiratory failure with hypoxia due to  large intracerebral hemorrhage Pneumonia  -Previously completed 10 days of antibiotic coverage for anterobasilar pneumonia, repeat sputum culture obtained 1/15 positive for abundant Pseudomonas, abundant Staphylococcus aureus, and moderate Enterobacter  P: Continue meropenem with stop date in place  Continue ventilator support with lung protective strategies  Wean PEEP and FiO2 for sats greater than 90%. Head of bed elevated 30 degrees. Plateau pressures less than 30 cm H20.  Follow intermittent chest x-ray and ABG.   SAT/SBT as tolerated, mentation preclude extubation  Ensure adequate pulmonary hygiene  VAP bundle in place  PAD protocol  Recurrent fever -T-max 1/18 101 -Negative DVT study,  P: Continue meropenem as above  Continue to trend CBC and fever curve   Urinary retention -Was on Urecholine for 10mg  TID from 1/4 - 1/14 dose was then increased to 25mg  TID from 1/14 - 1/17 with continue retention seen  -Foley was taken out 12/26/2022 -Bladder scan overnight1/18 with 2/14 urine retained, new foley placed  P: Routine foley care  Continue foley   History of chronic sacroiliitis Osteopenia RLS P: Supportive care  GOC 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  Anticipate severe long term disability without chance of independent function.  - PMT following, appreciate assistance with management - Family continues to desire full code status/full scope of care. Husband does have some dementia   Best Practice (right click and "Reselect all SmartList Selections" daily)   Diet/type: tubefeeds DVT prophylaxis: SCD GI prophylaxis: H2B Lines: N/A Foley:  N/A Code Status:  full code Last date of  multidisciplinary goals of care discussion: Pamela Johnston (son in law) - pall meet 12/15/22 - full code.  Meeting with Pamela Johnston CMO 1/9  Critical care:  CRITICAL CARE Performed by: Laticha Ferrucci D. Harris  Total critical care time: 38  minutes  Critical care time was exclusive of separately billable procedures and treating other patients.  Critical care was necessary to treat or prevent imminent or life-threatening deterioration.  Critical care was time spent personally by me on the following activities: development of treatment plan with patient and/or surrogate as well as nursing, discussions with consultants, evaluation of patient's response to treatment, examination of patient, obtaining history from patient or surrogate, ordering and performing treatments and interventions, ordering and review of laboratory studies, ordering and review of radiographic studies, pulse oximetry and re-evaluation of patient's condition.  Shawndrea Rutkowski D. Harris, NP-C [facility] Personal contact information can be found on Amion  If no contact or response made please call [phone] 12/28/2022, 8:17 AM

## 2022-12-28 NOTE — Progress Notes (Signed)
PCCM Progress Note   Called and spoke to [place] regarding daily update.   Brittaney Beaulieu D. Harris, NP-C [facility] Personal contact information can be found on Amion  If no contact or response made please call [phone] 12/28/2022, 12:32 PM

## 2022-12-28 NOTE — Progress Notes (Signed)
Discussed foley catheter with Dr. Verlee Monte. MD updated that the patient would need to keep foley for retention with no need to add medications

## 2022-12-29 DIAGNOSIS — J9601 Acute respiratory failure with hypoxia: Secondary | ICD-10-CM | POA: Diagnosis not present

## 2022-12-29 LAB — BASIC METABOLIC PANEL
Anion gap: 8 (ref 5–15)
BUN: 28 mg/dL — ABNORMAL HIGH (ref 8–23)
CO2: 28 mmol/L (ref 22–32)
Calcium: 7.9 mg/dL — ABNORMAL LOW (ref 8.9–10.3)
Chloride: 104 mmol/L (ref 98–111)
Creatinine, Ser: 0.52 mg/dL (ref 0.44–1.00)
GFR, Estimated: >60 mL/min (ref >60)
Glucose, Bld: 157 mg/dL — ABNORMAL HIGH (ref 70–99)
Potassium: 3.6 mmol/L (ref 3.5–5.1)
Sodium: 140 mmol/L (ref 135–145)

## 2022-12-29 LAB — BLOOD GAS, VENOUS
Acid-Base Excess: 7.4 mmol/L — ABNORMAL HIGH (ref 0.0–2.0)
Bicarbonate: 31.2 mmol/L — ABNORMAL HIGH (ref 20.0–28.0)
Drawn by: 42628
O2 Saturation: 100 %
Patient temperature: 38
pCO2, Ven: 42 mmHg — ABNORMAL LOW (ref 44–60)
pH, Ven: 7.48 — ABNORMAL HIGH (ref 7.25–7.43)
pO2, Ven: 123 mmHg — ABNORMAL HIGH (ref 32–45)

## 2022-12-29 LAB — CBC
HCT: 30.3 % — ABNORMAL LOW (ref 36.0–46.0)
Hemoglobin: 9.4 g/dL — ABNORMAL LOW (ref 12.0–15.0)
MCH: 30.3 pg (ref 26.0–34.0)
MCHC: 31 g/dL (ref 30.0–36.0)
MCV: 97.7 fL (ref 80.0–100.0)
Platelets: 284 10*3/uL (ref 150–400)
RBC: 3.1 MIL/uL — ABNORMAL LOW (ref 3.87–5.11)
RDW: 13.2 % (ref 11.5–15.5)
WBC: 11.7 10*3/uL — ABNORMAL HIGH (ref 4.0–10.5)
nRBC: 0 % (ref 0.0–0.2)

## 2022-12-29 LAB — GLUCOSE, CAPILLARY
Glucose-Capillary: 128 mg/dL — ABNORMAL HIGH (ref 70–99)
Glucose-Capillary: 150 mg/dL — ABNORMAL HIGH (ref 70–99)
Glucose-Capillary: 117 mg/dL — ABNORMAL HIGH (ref 70–99)
Glucose-Capillary: 121 mg/dL — ABNORMAL HIGH (ref 70–99)
Glucose-Capillary: 138 mg/dL — ABNORMAL HIGH (ref 70–99)
Glucose-Capillary: 103 mg/dL — ABNORMAL HIGH (ref 70–99)
Glucose-Capillary: 114 mg/dL — ABNORMAL HIGH (ref 70–99)

## 2022-12-29 MED ORDER — HYDRALAZINE HCL 20 MG/ML IJ SOLN
10.0000 mg | INTRAMUSCULAR | Status: AC | PRN
  Administered 2022-12-29 – 2022-12-30 (×3): 10 mg via INTRAVENOUS
  Filled 2022-12-29 (×4): qty 1

## 2022-12-29 NOTE — Progress Notes (Signed)
NAME:  Pamela Johnston, MRN:  [id number], DOB:  June 23, 1941, LOS: 30 ADMISSION DATE:  11/29/2022, CONSULTATION DATE:  11/29/2022 REFERRING MD:  Amada Jupiter - Neuro REASON FOR CONSULT: Ventilator management in setting of large IPH  History of Present Illness:  81-yer-old woman who presented to [facility] ED 12/21 as a Code Stroke. LKW 2000 when patient reportedly was in bed and became less responsive with +aphasia and R-sided weakness. One episode of nausea/vomiting en route with EMS. PMHx significant for HTN, chronic sacroiliitis (managed with Percocet), RLS, osteopenia.   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 Labs were grossly unremarkable with WBC mildly elevated to 12 and mild hyperglycemia to 154. Decision was made to intubate patient in ED after several episodes of emesis with concern for aspiration. HTS 3% was initiated.   Stroke service to admit. NSGY consulted with no plan for intervention at this time.   PCCM consulted for ventilator/hemodynamic management.   Pertinent Medical History:  Hypertension Sacroilititis Restless legs syndrome Osteopenia  Significant Hospital Events: Including procedures, antibiotic start and stop dates in addition to other pertinent events   12/21 - Presented to [facility] ED via EMS as Code Stroke. LKW 2000. Abrupt onset of decreased responsiveness, aphasia and R hemiplegia. Stroke admitting, NSGY consulted. PCCM consult for vent management. Treated with unasyn for aspiration pneumonia. CT Head with large L hemispheric parietal/temporal lobe IPH with extension into basal ganglia and IVH. HTS 3% started.   12/24 - Palliative care consulted, family requested meeting to be delayed 12/25 - Family requested goals of care meeting to be delayed 12/26 - Stopped  Unasyn, replete phos; [name] GOC conversation: family says full code 12/27 - Restarted Unasyn for fever, RLL infiltrate; resp culture: enterobacter, MDR. Palliative consulted  12/28 - CT Head unchanged 12/29 - Unasyn changed to bactrim 12/31 - Posturing, CT head with increased shift, increased bleeding 1/2 - Family fired CVA service after discussion RE poor prognosis  1/4 - Off sedation  1/5 - No acute issues overnight. Remains on vent with increased respirations off sedation. Fever curve slightly decreased  1/6 - Pall care meeting moved to 2pm today. Theodoro Grist the son-in-law will be present. On ven 30%/ On TF. Neo not started last night. MAP > 65 after fluids bolus. Febrile +.  Last CT head 12/09/22.  1/8 - No significant neurologic/clinical change. Brief GOC discussion with husband, Harvie Heck, at bedside in AM. Concerns from staff re: patient GOC/decisionmaking, want to ensure husband is included in decisions/information sharing. Attempted bedside meeting but family prematurely ended meeting due to escalated discussion. 1/9 - Febrile overnight to 101.71F, remains on bromocriptine. No significant neurologic exam changes. 1/10 Family meeting yesterday with Dr. Lindie Spruce, plan for trach/peg though husband says they are still discussing this morning. Per Dt. Lindie Spruce " So moving forward, [name] is the designated medical decision maker for Rozanna Cormany " 1/11 no new issues, plan for PEG tomorrow 1/12 for trach/peg today 1/15 No change in Neuro status , trach and PEG, No weaning , will repeat CT head without, get Case management involved in looking for LTAC ( Day 3 post trach on 1/12)   1/15-CT with some stabilization 1/19 No acute events overnight, respiratory culture 1/15 with abundant Pseudomonas, abundant Staphylococcus aureus, and moderate Enterobacter remains on Meropenem   Interim History / Subjective:  Weaned for 4-5 hours yesterday  Objective:   Blood pressure (!) 142/66, pulse (!) 115,  temperature 100 F (37.8 C), temperature source Axillary, resp. rate (!) 25, height 5\' 1"  (1.549 m), weight 60 kg, SpO2 100 %.    Vent Mode: PRVC FiO2 (%):  [40 %] 40 % Set Rate:  [16 bmp] 16 bmp Vt Set:  [380 mL] 380 mL PEEP:  [5 cmH20] 5 cmH20 Pressure Support:  [10 cmH20] 10 cmH20 Plateau Pressure:  [18 cmH20] 18 cmH20   Intake/Output Summary (Last 24 hours) at 12/29/2022 0759 Last data filed at 12/29/2022 0700 Gross per 24 hour  Intake 1520 ml  Output 1600 ml  Net -80 ml   Filed Weights   12/25/22 0500 12/26/22 0500 12/29/22 0353  Weight: 58.5 kg 58.5 kg 60 kg   Physical Exam  General appearance: 82 y.o., female, chronically ill appearing Neck: 6 shiley cuffed flex in place Lungs:mech breath sounds bl, equal ches trise CV: tachy RR, no murmur  Abdomen: Soft, non-tender; non-distended, BS present  Extremities: trace peripheral edema, warm Neuro: Extensor response LLE to noxious stim    BMP, CBC not yet collected   Resolved Hospital Problem List:  Hypotension Hyperglycemia  Enterobacter pneumonia -Completed 10 days of treatment for  Circulatory shock  Assessment & Plan:   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/mass effect Brain compression/ICH Right parietal lobe hemorrhage with associated edema  Severe encephalopathy Intraventricular hemorrhage and mass effect, vasogenic edema, subfalcine herniation -Multiple family meetings concluded over time with CMO on board, medical decision maker is [name] patient's son-in-law -Stroke team signed off P: Maintain neuro protective measures Nutrition and bowel regimen Aspiration precautions   Acute respiratory failure with hypoxia due to large intracerebral hemorrhage Pneumonia  -Previously completed 10 days of antibiotic coverage for anterobasilar pneumonia, repeat sputum culture obtained 1/15 positive for abundant Pseudomonas, abundant Staphylococcus aureus, and moderate  Enterobacter  P: Merrem 7d course stop date in place PS weans as tolerated  Wean PEEP and FiO2 for sats greater than 90%. Head of bed elevated 30 degrees.  Ensure adequate pulmonary hygiene  VAP bundle in place   Recurrent fever -T-max 1/18 101 -Negative DVT study,  P: Continue meropenem as above  Continue to trend CBC and fever curve   Urinary retention -Was on Urecholine for 10mg  TID from 1/4 - 1/14 dose was then increased to 25mg  TID from 1/14 - 1/17 with continue retention seen  -Foley was taken out 12/26/2022 -Bladder scan overnight1/18 with 440mls urine retained, new foley placed  P: Routine foley care  Continue foley  Will need urologic follow up potentially inpateint within a couple weeks if remains hospitalized or as outpatient  History of chronic sacroiliitis Osteopenia RLS P: Supportive care  GOC 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  Anticipate severe long term disability without chance of independent function.  - PMT following, appreciate assistance with management - Family continues to desire full code status/full scope of care. Husband does have some dementia   Best Practice (right click and "Reselect all SmartList Selections" daily)   Diet/type: tubefeeds DVT prophylaxis: SCD GI prophylaxis: H2B Lines: N/A Foley:  Yes, and it is still needed Code Status:  full code Last date of multidisciplinary goals of care discussion: [name] (son in law) - pall meet 12/15/22 - full code.  Meeting with Doreen Salvage CMO 1/9  Critical care:  CRITICAL CARE Performed by: Maryjane Hurter  Total critical care time: 88  minutes  Critical care time was exclusive of separately billable procedures and treating other patients.  Critical care was necessary to treat or prevent imminent or life-threatening deterioration.  Critical care was time spent personally by me on the following activities: development of treatment plan with  patient and/or surrogate as well as nursing, discussions with consultants, evaluation of patient's response to treatment, examination of patient, obtaining history from patient or surrogate, ordering and performing treatments and interventions, ordering and review of laboratory studies, ordering and review of radiographic studies, pulse oximetry and re-evaluation of patient's condition.  [facility] Pulmonary/Critical Care  Personal contact information can be found on Amion  If no contact or response made please call [phone] 12/29/2022, 7:59 AM

## 2022-12-29 NOTE — Progress Notes (Signed)
[facility] Progress Note Patient Name: JODY AGUINAGA DOB: 05/12/1941 MRN: [id number]   Date of Service  12/29/2022  HPI/Events of Note  Patient with sub-optimal BP control, target  is for SBP  < 160 mmHg.  eICU Interventions  PRN iv Hydralazine ordered.        Kerry Kass Netra Postlethwait 12/29/2022, 12:54 AM

## 2022-12-30 DIAGNOSIS — Z9911 Dependence on respirator [ventilator] status: Secondary | ICD-10-CM

## 2022-12-30 DIAGNOSIS — J9611 Chronic respiratory failure with hypoxia: Secondary | ICD-10-CM | POA: Diagnosis not present

## 2022-12-30 LAB — GLUCOSE, CAPILLARY
Glucose-Capillary: 128 mg/dL — ABNORMAL HIGH (ref 70–99)
Glucose-Capillary: 123 mg/dL — ABNORMAL HIGH (ref 70–99)
Glucose-Capillary: 117 mg/dL — ABNORMAL HIGH (ref 70–99)
Glucose-Capillary: 128 mg/dL — ABNORMAL HIGH (ref 70–99)
Glucose-Capillary: 125 mg/dL — ABNORMAL HIGH (ref 70–99)
Glucose-Capillary: 142 mg/dL — ABNORMAL HIGH (ref 70–99)

## 2022-12-30 LAB — BASIC METABOLIC PANEL
Anion gap: 7 (ref 5–15)
BUN: 28 mg/dL — ABNORMAL HIGH (ref 8–23)
CO2: 27 mmol/L (ref 22–32)
Calcium: 8 mg/dL — ABNORMAL LOW (ref 8.9–10.3)
Chloride: 104 mmol/L (ref 98–111)
Creatinine, Ser: 0.52 mg/dL (ref 0.44–1.00)
GFR, Estimated: >60 mL/min (ref >60)
Glucose, Bld: 132 mg/dL — ABNORMAL HIGH (ref 70–99)
Potassium: 3.8 mmol/L (ref 3.5–5.1)
Sodium: 138 mmol/L (ref 135–145)

## 2022-12-30 LAB — CBC
HCT: 27.5 % — ABNORMAL LOW (ref 36.0–46.0)
Hemoglobin: 8.8 g/dL — ABNORMAL LOW (ref 12.0–15.0)
MCH: 31 pg (ref 26.0–34.0)
MCHC: 32 g/dL (ref 30.0–36.0)
MCV: 96.8 fL (ref 80.0–100.0)
Platelets: 314 10*3/uL (ref 150–400)
RBC: 2.84 MIL/uL — ABNORMAL LOW (ref 3.87–5.11)
RDW: 13.3 % (ref 11.5–15.5)
WBC: 11.3 10*3/uL — ABNORMAL HIGH (ref 4.0–10.5)
nRBC: 0 % (ref 0.0–0.2)

## 2022-12-30 NOTE — Progress Notes (Signed)
NAME:  CARLIYAH COTTERMAN, MRN:  [id number], DOB:  1941/07/30, LOS: 31 ADMISSION DATE:  11/29/2022, CONSULTATION DATE:  11/29/2022 REFERRING MD:  Amada Jupiter - Neuro REASON FOR CONSULT: Ventilator management in setting of large IPH  History of Present Illness:  81-yer-old woman who presented to [facility] ED 12/21 as a Code Stroke. LKW 2000 when patient reportedly was in bed and became less responsive with +aphasia and R-sided weakness. One episode of nausea/vomiting en route with EMS. PMHx significant for HTN, chronic sacroiliitis (managed with Percocet), RLS, osteopenia.   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 Labs were grossly unremarkable with WBC mildly elevated to 12 and mild hyperglycemia to 154. Decision was made to intubate patient in ED after several episodes of emesis with concern for aspiration. HTS 3% was initiated.   Stroke service to admit. NSGY consulted with no plan for intervention at this time.   PCCM consulted for ventilator/hemodynamic management.   Pertinent Medical History:  Hypertension Sacroilititis Restless legs syndrome Osteopenia  Significant Hospital Events: Including procedures, antibiotic start and stop dates in addition to other pertinent events   12/21 - Presented to [facility] ED via EMS as Code Stroke. LKW 2000. Abrupt onset of decreased responsiveness, aphasia and R hemiplegia. Stroke admitting, NSGY consulted. PCCM consult for vent management. Treated with unasyn for aspiration pneumonia. CT Head with large L hemispheric parietal/temporal lobe IPH with extension into basal ganglia and IVH. HTS 3% started.   12/24 - Palliative care consulted, family requested meeting to be delayed 12/25 - Family requested goals of care meeting to be delayed 12/26 - Stopped  Unasyn, replete phos; [name] GOC conversation: family says full code 12/27 - Restarted Unasyn for fever, RLL infiltrate; resp culture: enterobacter, MDR. Palliative consulted  12/28 - CT Head unchanged 12/29 - Unasyn changed to bactrim 12/31 - Posturing, CT head with increased shift, increased bleeding 1/2 - Family fired CVA service after discussion RE poor prognosis  1/4 - Off sedation  1/5 - No acute issues overnight. Remains on vent with increased respirations off sedation. Fever curve slightly decreased  1/6 - Pall care meeting moved to 2pm today. Theodoro Grist the son-in-law will be present. On ven 30%/ On TF. Neo not started last night. MAP > 65 after fluids bolus. Febrile +.  Last CT head 12/09/22.  1/8 - No significant neurologic/clinical change. Brief GOC discussion with husband, Harvie Heck, at bedside in AM. Concerns from staff re: patient GOC/decisionmaking, want to ensure husband is included in decisions/information sharing. Attempted bedside meeting but family prematurely ended meeting due to escalated discussion. 1/9 - Febrile overnight to 101.47F, remains on bromocriptine. No significant neurologic exam changes. 1/10 Family meeting yesterday with Dr. Lindie Spruce, plan for trach/peg though husband says they are still discussing this morning. Per Dt. Lindie Spruce " So moving forward, [name] is the designated medical decision maker for Hina Gupta " 1/11 no new issues, plan for PEG tomorrow 1/12 for trach/peg today 1/15 No change in Neuro status , trach and PEG, No weaning , will repeat CT head without, get Case management involved in looking for LTAC ( Day 3 post trach on 1/12)   1/15-CT with some stabilization 1/19 No acute events overnight, respiratory culture 1/15 with abundant Pseudomonas, abundant Staphylococcus aureus, and moderate Enterobacter remains on Meropenem   Interim History / Subjective:  Tolerated PS yesterday. Rested overnight. On PS this am  Objective:   Blood pressure (!) 108/52,  pulse (!) 106, temperature 99.2 F (37.3 C), temperature source Axillary, resp. rate (!) 23, height 5\' 1"  (1.549 m), weight 60.4 kg, SpO2 100 %.    Vent Mode: PSV;CPAP FiO2 (%):  [40 %] 40 % Set Rate:  [16 bmp] 16 bmp Vt Set:  [380 mL] 380 mL PEEP:  [5 cmH20] 5 cmH20 Pressure Support:  [10 cmH20] 10 cmH20 Plateau Pressure:  [12 cmH20] 12 cmH20   Intake/Output Summary (Last 24 hours) at 12/30/2022 0901 Last data filed at 12/30/2022 0800 Gross per 24 hour  Intake 1586.14 ml  Output 1250 ml  Net 336.14 ml   Filed Weights   12/26/22 0500 12/29/22 0353 12/30/22 0449  Weight: 58.5 kg 60 kg 60.4 kg   Physical Exam: General: Chronically ill-appearing, no acute distress HENT: [place], AT, OP clear, MMM Neck: Cuffed #6 shiley in place Eyes: EOMI, no scleral icterus Respiratory: Clear to auscultation bilaterally.  No crackles, wheezing or rales Cardiovascular: RRR, -M/R/G, no JVD GI: BS+, soft, nontender Extremities: Peripheral edema in upper and lower extremities Neuro: Eyes closed, does not follow commands, does not withdraw to noxious stimuli, +cough/gag. 44mm pupils reactive  WBC improved to 11.3 BMET acceptable  Resolved Hospital Problem List:  Hypotension Hyperglycemia  Enterobacter pneumonia -Completed 10 days of treatment for  Circulatory shock  Assessment & Plan:   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/mass effect Brain compression/ICH Right parietal lobe hemorrhage with associated edema  Severe encephalopathy Intraventricular hemorrhage and mass effect, vasogenic edema, subfalcine herniation -Multiple family meetings concluded over time with CMO on board, medical decision maker is [name] patient's son-in-law -Stroke team signed off P: Maintain neuro protective measures Nutrition and bowel regimen Aspiration precautions   Acute respiratory failure with hypoxia due to large intracerebral hemorrhage Pneumonia   -Previously completed 10 days of antibiotic coverage for anterobasilar pneumonia, repeat sputum culture obtained 1/15 positive for abundant Pseudomonas, abundant Staphylococcus aureus, and moderate Enterobacter  P: Meropenem 7d course stop date in place PS as tolerated Wean PEEP and FiO2 for sats greater than 90%. Head of bed elevated 30 degrees.  Ensure adequate pulmonary hygiene  VAP bundle in place   Recurrent fever -T-max 1/18 101. Afebrile since then -Improving leukocytosis on Meropenem -Negative DVT study,  P: Continue meropenem as above  Continue to trend CBC and fever curve   Urinary retention -Was on Urecholine for 10mg  TID from 1/4 - 1/14 dose was then increased to 25mg  TID from 1/14 - 1/17 with continue retention seen  -Foley was taken out 12/26/2022 -Bladder scan overnight1/18 with 482mls urine retained, new foley placed  P: Routine foley Continue foley  Will need urologic follow up potentially inpateint within a couple weeks if remains hospitalized or as outpatient  History of chronic sacroiliitis Osteopenia RLS P: Supportive care  GOC 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  Anticipate severe long term disability without chance of independent function.  - PMT following, appreciate assistance with management - Family continues to desire full code status/full scope of care. Husband does have some dementia   Best Practice (right click and "Reselect all SmartList Selections" daily)   Diet/type: tubefeeds DVT prophylaxis: SCD GI prophylaxis: H2B Lines: N/A Foley:  Yes, and it is still needed Code Status:  full code Last date of multidisciplinary goals of care discussion: [name] (son in law) - pall meet 12/15/22 - full  code.  Meeting with Doreen Salvage [facility] 1/9  Critical care:   The patient is critically ill with multiple organ systems failure and requires high complexity decision making for assessment and support,  frequent evaluation and titration of therapies, application of advanced monitoring technologies and extensive interpretation of multiple databases.  Independent Critical Care Time: 35 Minutes.   Rodman Pickle, M.D. [facility] Pulmonary/Critical Care Medicine 12/30/2022 9:02 AM   Please see Amion for pager number to reach on-call Pulmonary and Critical Care Team.

## 2022-12-31 ENCOUNTER — Ambulatory Visit: Payer: Medicare PPO

## 2022-12-31 DIAGNOSIS — I611 Nontraumatic intracerebral hemorrhage in hemisphere, cortical: Secondary | ICD-10-CM | POA: Diagnosis not present

## 2022-12-31 LAB — GLUCOSE, CAPILLARY
Glucose-Capillary: 135 mg/dL — ABNORMAL HIGH (ref 70–99)
Glucose-Capillary: 134 mg/dL — ABNORMAL HIGH (ref 70–99)
Glucose-Capillary: 134 mg/dL — ABNORMAL HIGH (ref 70–99)
Glucose-Capillary: 121 mg/dL — ABNORMAL HIGH (ref 70–99)
Glucose-Capillary: 129 mg/dL — ABNORMAL HIGH (ref 70–99)
Glucose-Capillary: 136 mg/dL — ABNORMAL HIGH (ref 70–99)

## 2022-12-31 NOTE — Progress Notes (Signed)
NAME:  Pamela Johnston, MRN:  [id number], DOB:  10-01-1941, LOS: 62 ADMISSION DATE:  11/29/2022, CONSULTATION DATE:  11/29/2022 REFERRING MD:  Leonel Ramsay - Neuro REASON FOR CONSULT: Ventilator management in setting of large IPH  History of Present Illness:  81-yer-old woman who presented to [facility] ED 12/21 as a Code Stroke. LKW 2000 when patient reportedly was in bed and became less responsive with +aphasia and R-sided weakness. One episode of nausea/vomiting en route with EMS. PMHx significant for HTN, chronic sacroiliitis (managed with Percocet), RLS, osteopenia.   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 Labs were grossly unremarkable with WBC mildly elevated to 12 and mild hyperglycemia to 154. Decision was made to intubate patient in ED after several episodes of emesis with concern for aspiration. HTS 3% was initiated.   Stroke service to admit. NSGY consulted with no plan for intervention at this time.   PCCM consulted for ventilator/hemodynamic management.   Pertinent Medical History:  Hypertension Sacroilititis Restless legs syndrome Osteopenia  Significant Hospital Events: Including procedures, antibiotic start and stop dates in addition to other pertinent events   12/21 - Presented to [facility] ED via EMS as Code Stroke. LKW 2000. Abrupt onset of decreased responsiveness, aphasia and R hemiplegia. Stroke admitting, NSGY consulted. PCCM consult for vent management. Treated with unasyn for aspiration pneumonia. CT Head with large L hemispheric parietal/temporal lobe IPH with extension into basal ganglia and IVH. HTS 3% started.   12/24 - Palliative care consulted, family requested meeting to be delayed 12/25 - Family requested goals of care meeting to be delayed 12/26 - Stopped  Unasyn, replete phos; [name] GOC conversation: family says full code 12/27 - Restarted Unasyn for fever, RLL infiltrate; resp culture: enterobacter, MDR. Palliative consulted  12/28 - CT Head unchanged 12/29 - Unasyn changed to bactrim 12/31 - Posturing, CT head with increased shift, increased bleeding 1/2 - Family fired CVA service after discussion RE poor prognosis  1/4 - Off sedation  1/5 - No acute issues overnight. Remains on vent with increased respirations off sedation. Fever curve slightly decreased  1/6 - Pall care meeting moved to 2pm today. [name] the son-in-law will be present. On ven 30%/ On TF. Neo not started last night. MAP > 65 after fluids bolus. Febrile +.  Last CT head 12/09/22.  1/8 - No significant neurologic/clinical change. Brief GOC discussion with husband, Pamela Johnston, at bedside in AM. Concerns from staff re: patient GOC/decisionmaking, want to ensure husband is included in decisions/information sharing. Attempted bedside meeting but family prematurely ended meeting due to escalated discussion. 1/9 - Febrile overnight to 101.11F, remains on bromocriptine. No significant neurologic exam changes. 1/10 Family meeting yesterday with [name], plan for trach/peg though husband says they are still discussing this morning. Per Dt. Hulen Skains " So moving forward, Pamela Johnston is the designated medical decision maker for Pamela Johnston " 1/11 no new issues, plan for PEG tomorrow 1/12 for trach/peg today 1/15 No change in Neuro status , trach and PEG, No weaning , will repeat CT head without, get Case management involved in looking for LTAC ( Day 3 post trach on 1/12)   1/15-CT with some stabilization 1/19 No acute events overnight, respiratory culture 1/15 with abundant Pseudomonas, abundant Staphylococcus aureus, and moderate Enterobacter remains on Meropenem  1/22 trach collar trial  Interim History / Subjective:  Tolerated the vent weaning last over days.  Placed on trach collar today.   To continue overnight.   Objective:   Blood pressure (!) 155/70, pulse (!) 116, temperature 99.5 F (37.5 C), temperature source Axillary, resp. rate (!) 31, height 5\' 1"  (1.549 m), weight 58.3 kg, SpO2 99 %.    Vent Mode: Stand-by FiO2 (%):  [40 %] 40 % Set Rate:  [16 bmp] 16 bmp Vt Set:  [380 mL] 380 mL PEEP:  [5 cmH20] 5 cmH20 Pressure Support:  [10 cmH20] 10 cmH20 Plateau Pressure:  [12 cmH20-16 cmH20] 16 cmH20   Intake/Output Summary (Last 24 hours) at 12/31/2022 1329 Last data filed at 12/31/2022 1200 Gross per 24 hour  Intake 1139.99 ml  Output 1710 ml  Net -570.01 ml    Filed Weights   12/29/22 0353 12/30/22 0449 12/31/22 0500  Weight: 60 kg 60.4 kg 58.3 kg   Physical Exam: General: Chronically ill-appearing, no acute distress HENT: [place], AT, OP clear, MMM Neck: Cuffed #6 shiley in place Eyes: EOMI, no scleral icterus Respiratory: Clear to auscultation bilaterally.  No crackles, wheezing or rales Cardiovascular: RRR, -M/R/G, no JVD GI: BS+, soft, nontender Extremities: Peripheral edema in upper and lower extremities Neuro: Eyes closed, does not follow commands, does not withdraw to noxious stimuli, +cough/gag. 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/mass effect Brain compression/ICH Right parietal lobe hemorrhage with associated edema  Severe encephalopathy Intraventricular hemorrhage and mass effect, vasogenic edema, subfalcine herniation -Multiple family meetings concluded over time with CMO on board, medical decision maker is 3m patient's son-in-law -Stroke team signed off P: Maintain neuro protective measures Nutrition and bowel regimen Aspiration precautions   Acute respiratory failure with hypoxia due to large  intracerebral hemorrhage Pneumonia  -Previously completed 10 days of antibiotic coverage for anterobasilar pneumonia, repeat sputum culture obtained 1/15 positive for abundant Pseudomonas, abundant Staphylococcus aureus, and moderate Enterobacter  P: Meropenem 7d course stop date in place TC as tolerated  Recurrent fever (improved) -Improving leukocytosis on Meropenem -Negative DVT study, P: Continue meropenem as above  Continue to trend CBC and fever curve   Urinary retention -Was on Urecholine for 10mg  TID from 1/4 - 1/14 dose was then increased to 25mg  TID from 1/14 - 1/17 with continue retention seen  -Foley was taken out 12/26/2022 -Bladder scan overnight1/18 with 2/14 urine retained, new foley placed  P: Routine foley Continue foley  Will need urologic follow up potentially inpateint within a couple weeks if remains hospitalized or as outpatient  History of chronic sacroiliitis Osteopenia RLS P: Supportive care  GOC 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  Anticipate severe long term disability without chance of independent function.  - PMT following, appreciate assistance with management - Family continues to desire full code status/full scope of care. Husband does have some dementia   Best Practice (right click and "Reselect all SmartList Selections" daily)   Diet/type: tubefeeds DVT prophylaxis: SCD GI prophylaxis: H2B Lines: N/A Foley:  Yes, and it is still needed Code Status:  full code Last date of multidisciplinary goals of care discussion: 09-11-1999 (son in law) - pall meet 12/15/22 - full code.  Meeting with CMO 1/9  Critical care:   The patient is critically ill with multiple organ systems  failure and requires high complexity decision making for assessment and support, frequent evaluation and titration of therapies, application of advanced monitoring technologies and extensive interpretation of multiple  databases.  Independent Critical Care Time: 33 Minutes.   Lanier Clam, MD [facility] 12/31/2022 1:29 PM   Please see Amion for pager number to reach on-call Pulmonary and Critical Care Team.

## 2023-01-01 DIAGNOSIS — I611 Nontraumatic intracerebral hemorrhage in hemisphere, cortical: Secondary | ICD-10-CM | POA: Diagnosis not present

## 2023-01-01 LAB — GLUCOSE, CAPILLARY
Glucose-Capillary: 115 mg/dL — ABNORMAL HIGH (ref 70–99)
Glucose-Capillary: 119 mg/dL — ABNORMAL HIGH (ref 70–99)
Glucose-Capillary: 124 mg/dL — ABNORMAL HIGH (ref 70–99)
Glucose-Capillary: 136 mg/dL — ABNORMAL HIGH (ref 70–99)
Glucose-Capillary: 141 mg/dL — ABNORMAL HIGH (ref 70–99)
Glucose-Capillary: 143 mg/dL — ABNORMAL HIGH (ref 70–99)

## 2023-01-01 MED ORDER — FUROSEMIDE 10 MG/ML IJ SOLN
40.0000 mg | Freq: Once | INTRAMUSCULAR | Status: AC
  Administered 2023-01-01: 40 mg via INTRAVENOUS
  Filled 2023-01-01: qty 4

## 2023-01-01 NOTE — Progress Notes (Signed)
Physical Therapy Treatment Patient Details Name: Pamela Johnston MRN: [id number] DOB: 1941/11/29 Today's Date: 01/01/2023   History of Present Illness 60-yer-old woman who presented to [facility] ED 12/21 as a Code Stroke with +aphasia and R-sided weakness. 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 Pt underwent trach and peg on 12/19/22. PMHx significant for HTN, chronic sacroiliitis (managed with Percocet), RLS, osteopenia.    PT Comments    PT transferred pt into sitting via placing bed in egress position. Pt on trach collar at 15LO2 and maintained SpO2 >96% t/o sitting up. Pt continues to demo no command following, no eye opening to name or auditory stimuli, noted bilat UE movement into extensor posturing with movement, and sluggish R pupil response to light. Pt remains unable to actively participate in therapy at this time. PT to continue to monitor pt at 1x/wk.     Recommendations for follow up therapy are one component of a multi-disciplinary discharge planning process, led by the attending physician.  Recommendations may be updated based on patient status, additional functional criteria and insurance authorization.  Follow Up Recommendations  Long-term institutional care without follow-up therapy Can patient physically be transported by private vehicle: No   Assistance Recommended at Discharge Frequent or constant Supervision/Assistance  Patient can return home with the following  (unable to return home safely at this time)   Equipment Recommendations  None recommended by PT    Recommendations for Other Services       Precautions / Restrictions Precautions Precautions: Fall Precaution Comments: trach Restrictions Weight Bearing Restrictions: No     Mobility  Bed Mobility Overal bed mobility: Needs Assistance Bed Mobility: Supine to Sit     Supine to sit:  Total assist (used egress in bed)     General bed mobility comments: pt with no active movement or participation. Pt on trach collar, SpO2 >96% on 15Lo2    Transfers                   General transfer comment: unable    Ambulation/Gait               General Gait Details: unable   Stairs             Wheelchair Mobility    Modified Rankin (Stroke Patients Only) Modified Rankin (Stroke Patients Only) Pre-Morbid Rankin Score: Moderately severe disability Modified Rankin: Severe disability     Balance Overall balance assessment: Needs assistance Sitting-balance support: Feet unsupported, No upper extremity supported Sitting balance-Leahy Scale: Zero Sitting balance - Comments: reliant on support of bed in egress position                                    Cognition Arousal/Alertness: Lethargic Behavior During Therapy: Flat affect Overall Cognitive Status: Impaired/Different from baseline                                 General Comments: pt with no opening of eyes to name or auditory stimulus. Pt with sluggish R pupil restriction to light, disconjucate gaze with manual assist to open eyes. no command follow. withdrawal to pain in bilat LEs but not UEs        Exercises Other Exercises Other Exercises: PROM x4 extremities, pt  with noted posturing into extension in bilat UEs limiting ROM at shoulder. Pt with noted bilat knee contractures from prolonged sitting in w/c PTA    General Comments General comments (skin integrity, edema, etc.): bilat UE edema      Pertinent Vitals/Pain Pain Assessment Pain Assessment: Faces Faces Pain Scale: No hurt    Home Living                          Prior Function            PT Goals (current goals can now be found in the care plan section) Acute Rehab PT Goals Patient Stated Goal: didn't state PT Goal Formulation: Patient unable to participate in goal setting Time  For Goal Achievement: 01/08/23 Potential to Achieve Goals: Poor Progress towards PT goals: Not progressing toward goals - comment    Frequency    Min 1X/week (for trial)      PT Plan Current plan remains appropriate    Co-evaluation              AM-PAC PT "6 Clicks" Mobility   Outcome Measure  Help needed turning from your back to your side while in a flat bed without using bedrails?: Total Help needed moving from lying on your back to sitting on the side of a flat bed without using bedrails?: Total Help needed moving to and from a bed to a chair (including a wheelchair)?: Total Help needed standing up from a chair using your arms (e.g., wheelchair or bedside chair)?: Total Help needed to walk in hospital room?: Total Help needed climbing 3-5 steps with a railing? : Total 6 Click Score: 6    End of Session   Activity Tolerance: Patient limited by lethargy Patient left: in bed;with call bell/phone within reach;with bed alarm set;with nursing/sitter in room Nurse Communication: Mobility status PT Visit Diagnosis: Difficulty in walking, not elsewhere classified (R26.2);Unsteadiness on feet (R26.81)     Time: 1696-7893 PT Time Calculation (min) (ACUTE ONLY): 11 min  Charges:  $Therapeutic Activity: 8-22 mins                     Kittie Plater, PT, DPT Acute Rehabilitation Services Secure chat preferred Office #: [phone]    Berline Lopes 01/01/2023, 1:18 PM

## 2023-01-01 NOTE — Progress Notes (Signed)
Patient had numerous thick tan secretions coming out of trach. After one pass of tracheal suctioning that brought up copious, thick, tan secretions, patient then desat to 88% and has not come back up. RRT notified.   Montez Hageman RN

## 2023-01-01 NOTE — Progress Notes (Signed)
Pt is on trach collard at this time 10L 35%, sats 100% RR 20. Vent is on standby.

## 2023-01-01 NOTE — Progress Notes (Signed)
NAME:  Pamela Johnston, MRN:  [id number], DOB:  11-Nov-1941, LOS: 33 ADMISSION DATE:  11/29/2022, CONSULTATION DATE:  11/29/2022 REFERRING MD:  Amada Jupiter - Neuro REASON FOR CONSULT: Ventilator management in setting of large IPH  History of Present Illness:  81-yer-old woman who presented to [facility] ED 12/21 as a Code Stroke. LKW 2000 when patient reportedly was in bed and became less responsive with +aphasia and R-sided weakness. One episode of nausea/vomiting en route with EMS. PMHx significant for HTN, chronic sacroiliitis (managed with Percocet), RLS, osteopenia.   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 Labs were grossly unremarkable with WBC mildly elevated to 12 and mild hyperglycemia to 154. Decision was made to intubate patient in ED after several episodes of emesis with concern for aspiration. HTS 3% was initiated.   Stroke service to admit. NSGY consulted with no plan for intervention at this time.   PCCM consulted for ventilator/hemodynamic management.   Pertinent Medical History:  Hypertension Sacroilititis Restless legs syndrome Osteopenia  Significant Hospital Events: Including procedures, antibiotic start and stop dates in addition to other pertinent events   12/21 - Presented to [facility] ED via EMS as Code Stroke. LKW 2000. Abrupt onset of decreased responsiveness, aphasia and R hemiplegia. Stroke admitting, NSGY consulted. PCCM consult for vent management. Treated with unasyn for aspiration pneumonia. CT Head with large L hemispheric parietal/temporal lobe IPH with extension into basal ganglia and IVH. HTS 3% started.   12/24 - Palliative care consulted, family requested meeting to be delayed 12/25 - Family requested goals of care meeting to be delayed 12/26 - Stopped  Unasyn, replete phos; [name] GOC conversation: family says full code 12/27 - Restarted Unasyn for fever, RLL infiltrate; resp culture: enterobacter, MDR. Palliative consulted  12/28 - CT Head unchanged 12/29 - Unasyn changed to bactrim 12/31 - Posturing, CT head with increased shift, increased bleeding 1/2 - Family fired CVA service after discussion RE poor prognosis  1/4 - Off sedation  1/5 - No acute issues overnight. Remains on vent with increased respirations off sedation. Fever curve slightly decreased  1/6 - Pall care meeting moved to 2pm today. Theodoro Grist the son-in-law will be present. On ven 30%/ On TF. Neo not started last night. MAP > 65 after fluids bolus. Febrile +.  Last CT head 12/09/22.  1/8 - No significant neurologic/clinical change. Brief GOC discussion with husband, Harvie Heck, at bedside in AM. Concerns from staff re: patient GOC/decisionmaking, want to ensure husband is included in decisions/information sharing. Attempted bedside meeting but family prematurely ended meeting due to escalated discussion. 1/9 - Febrile overnight to 101.51F, remains on bromocriptine. No significant neurologic exam changes. 1/10 Family meeting yesterday with Dr. Lindie Spruce, plan for trach/peg though husband says they are still discussing this morning. Per Dt. Lindie Spruce " So moving forward, [name] is the designated medical decision maker for Daiya Tamer " 1/11 no new issues, plan for PEG tomorrow 1/12 for trach/peg today 1/15 No change in Neuro status , trach and PEG, No weaning , will repeat CT head without, get Case management involved in looking for LTAC ( Day 3 post trach on 1/12)   1/15-CT with some stabilization 1/19 No acute events overnight, respiratory culture 1/15 with abundant Pseudomonas, abundant Staphylococcus aureus, and moderate Enterobacter remains on Meropenem  1/22 trach collar trial 12 +  hrs  Interim History / Subjective:   NAEON. Did TC for 12+ hrs, placed back on vent overnight with  tachypnea. Back. on TC this AM. Neurologically devastated on exam. Puffy.   Objective:   Blood pressure (!) 144/65, pulse (!) 101, temperature 99.4 F (37.4 C), temperature source Oral, resp. rate 20, height 5\' 1"  (1.549 m), weight 58.3 kg, SpO2 98 %.    Vent Mode: PRVC FiO2 (%):  [35 %-40 %] 35 % Set Rate:  [16 bmp] 16 bmp Vt Set:  [380 mL] 380 mL PEEP:  [5 cmH20] 5 cmH20 Plateau Pressure:  [11 cmH20-16 cmH20] 11 cmH20   Intake/Output Summary (Last 24 hours) at 01/01/2023 0914 Last data filed at 01/01/2023 0800 Gross per 24 hour  Intake 1084.01 ml  Output 1900 ml  Net -815.99 ml    Filed Weights   12/29/22 0353 12/30/22 0449 12/31/22 0500  Weight: 60 kg 60.4 kg 58.3 kg   Physical Exam: General: Chronically ill-appearing, no acute distress HENT: [place], AT, OP clear, MMM Neck: Cuffed #6 shiley in place Eyes: EOMI, no scleral icterus Respiratory: Clear to auscultation bilaterally.  No crackles, wheezing or rales Cardiovascular: RRR, -M/R/G, no JVD GI: BS+, soft, nontender Extremities: Peripheral edema in upper and lower extremities Neuro: Eyes closed, does not follow commands, does not withdraw to noxious stimuli, +cough/gag. 73mm pupils reactive   Resolved Hospital Problem List:  Hypotension Hyperglycemia  Enterobacter pneumonia -Completed 10 days of treatment for  Circulatory shock  Assessment & Plan:   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/mass effect Brain compression/ICH Right parietal lobe hemorrhage with associated edema  Severe encephalopathy Intraventricular hemorrhage and mass effect, vasogenic edema, subfalcine herniation -Multiple family meetings concluded over time with CMO on board, medical decision maker is [name] patient's son-in-law -Stroke team signed off P: Maintain neuro protective measures Nutrition and bowel regimen Aspiration precautions   Acute respiratory failure with hypoxia due to large  intracerebral hemorrhage Pneumonia  -Previously completed 10 days of antibiotic coverage for anterobasilar pneumonia, repeat sputum culture obtained 1/15 positive for abundant Pseudomonas, abundant Staphylococcus aureus, and moderate Enterobacter  P: Meropenem 7d course stop date in place TC as tolerated, bac on vent overnight 1/22-1/23 after TC during day  Recurrent fever (improved) -Improving leukocytosis on Meropenem -Negative DVT study, P: Continue meropenem as above  Continue to trend CBC and fever curve   Urinary retention -Was on Urecholine for 10mg  TID from 1/4 - 1/14 dose was then increased to 25mg  TID from 1/14 - 1/17 with continue retention seen  -Foley was taken out 12/26/2022 -Bladder scan overnight1/18 with 415mls urine retained, new foley placed -New foley placed 1/23 overnight  P: Continue foley indefinitely now failed multiple TOV Will need urologic follow up as outpatient  History of chronic sacroiliitis Osteopenia RLS P: Supportive care  Volume Overload: Dueto IVF, salt load with critical illness P: - IV lasix 1/23  GOC 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  Anticipate severe long term disability without chance of independent function.  - PMT following, appreciate assistance with management - Family continues to desire full code status/full scope of care. Husband does have some dementia   Best Practice (right click and "Reselect all SmartList Selections" daily)   Diet/type: tubefeeds DVT prophylaxis: SCD GI prophylaxis: H2B Lines: N/A Foley:  Yes, and it is still needed Code Status:  full code Last date of multidisciplinary goals of care discussion: [name] (son in law) - pall meet 12/15/22 -  full code.  Meeting with Doreen Salvage CMO 1/9  Critical care:   The patient is critically ill with multiple organ systems failure and requires high complexity decision making for assessment and support, frequent  evaluation and titration of therapies, application of advanced monitoring technologies and extensive interpretation of multiple databases.  Independent Critical Care Time: 35 Minutes.   Lanier Clam, MD [facility] 01/01/2023 9:14 AM   Please see Amion for pager number to reach on-call Pulmonary and Critical Care Team.

## 2023-01-02 DIAGNOSIS — I611 Nontraumatic intracerebral hemorrhage in hemisphere, cortical: Secondary | ICD-10-CM | POA: Diagnosis not present

## 2023-01-02 LAB — BASIC METABOLIC PANEL
Anion gap: 9 (ref 5–15)
BUN: 26 mg/dL — ABNORMAL HIGH (ref 8–23)
CO2: 28 mmol/L (ref 22–32)
Calcium: 8.2 mg/dL — ABNORMAL LOW (ref 8.9–10.3)
Chloride: 100 mmol/L (ref 98–111)
Creatinine, Ser: 0.64 mg/dL (ref 0.44–1.00)
GFR, Estimated: >60 mL/min (ref >60)
Glucose, Bld: 114 mg/dL — ABNORMAL HIGH (ref 70–99)
Potassium: 3.7 mmol/L (ref 3.5–5.1)
Sodium: 137 mmol/L (ref 135–145)

## 2023-01-02 LAB — GLUCOSE, CAPILLARY
Glucose-Capillary: 115 mg/dL — ABNORMAL HIGH (ref 70–99)
Glucose-Capillary: 103 mg/dL — ABNORMAL HIGH (ref 70–99)
Glucose-Capillary: 111 mg/dL — ABNORMAL HIGH (ref 70–99)
Glucose-Capillary: 157 mg/dL — ABNORMAL HIGH (ref 70–99)
Glucose-Capillary: 135 mg/dL — ABNORMAL HIGH (ref 70–99)
Glucose-Capillary: 120 mg/dL — ABNORMAL HIGH (ref 70–99)

## 2023-01-02 MED ORDER — JEVITY 1.2 CAL PO LIQD
1000.0000 mL | ORAL | Status: DC
  Administered 2023-01-02 – 2023-02-26 (×48): 1000 mL
  Filled 2023-01-02 (×77): qty 1000

## 2023-01-02 MED ORDER — JUVEN PO PACK
1.0000 | PACK | Freq: Two times a day (BID) | ORAL | Status: DC
  Administered 2023-01-02 – 2023-02-27 (×112): 1
  Filled 2023-01-02 (×103): qty 1

## 2023-01-02 MED ORDER — POTASSIUM CHLORIDE 20 MEQ PO PACK
20.0000 meq | PACK | Freq: Once | ORAL | Status: AC
  Administered 2023-01-02: 20 meq
  Filled 2023-01-02: qty 1

## 2023-01-02 MED ORDER — JEVITY 1.2 CAL PO LIQD: 1000.0000 mL | ORAL | Status: DC

## 2023-01-02 MED ORDER — JEVITY 1.5 CAL/FIBER PO LIQD
1000.0000 mL | ORAL | Status: DC
  Filled 2023-01-02: qty 1000

## 2023-01-02 MED ORDER — FUROSEMIDE 10 MG/ML IJ SOLN
40.0000 mg | Freq: Once | INTRAMUSCULAR | Status: AC
  Administered 2023-01-02: 40 mg via INTRAVENOUS
  Filled 2023-01-02: qty 4

## 2023-01-02 NOTE — Progress Notes (Signed)
NAME:  Pamela Johnston, MRN:  [id number], DOB:  1941/05/27, LOS: 67 ADMISSION DATE:  11/29/2022, CONSULTATION DATE:  11/29/2022 REFERRING MD:  Leonel Ramsay - Neuro REASON FOR CONSULT: Ventilator management in setting of large IPH  History of Present Illness:  81-yer-old woman who presented to [facility] ED 12/21 as a Code Stroke. LKW 2000 when patient reportedly was in bed and became less responsive with +aphasia and R-sided weakness. One episode of nausea/vomiting en route with EMS. PMHx significant for HTN, chronic sacroiliitis (managed with Percocet), RLS, osteopenia.   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 Labs were grossly unremarkable with WBC mildly elevated to 12 and mild hyperglycemia to 154. Decision was made to intubate patient in ED after several episodes of emesis with concern for aspiration. HTS 3% was initiated.   Stroke service to admit. NSGY consulted with no plan for intervention at this time.   PCCM consulted for ventilator/hemodynamic management.   Pertinent Medical History:  Hypertension Sacroilititis Restless legs syndrome Osteopenia  Significant Hospital Events: Including procedures, antibiotic start and stop dates in addition to other pertinent events   12/21 - Presented to [facility] ED via EMS as Code Stroke. LKW 2000. Abrupt onset of decreased responsiveness, aphasia and R hemiplegia. Stroke admitting, NSGY consulted. PCCM consult for vent management. Treated with unasyn for aspiration pneumonia. CT Head with large L hemispheric parietal/temporal lobe IPH with extension into basal ganglia and IVH. HTS 3% started.   12/24 - Palliative care consulted, family requested meeting to be delayed 12/25 - Family requested goals of care meeting to be delayed 12/26 - Stopped  Unasyn, replete phos; [name] GOC conversation: family says full code 12/27 - Restarted Unasyn for fever, RLL infiltrate; resp culture: enterobacter, MDR. Palliative consulted  12/28 - CT Head unchanged 12/29 - Unasyn changed to bactrim 12/31 - Posturing, CT head with increased shift, increased bleeding 1/2 - Family fired CVA service after discussion RE poor prognosis  1/4 - Off sedation  1/5 - No acute issues overnight. Remains on vent with increased respirations off sedation. Fever curve slightly decreased  1/6 - Pall care meeting moved to 2pm today. [name] the son-in-law will be present. On ven 30%/ On TF. Neo not started last night. MAP > 65 after fluids bolus. Febrile +.  Last CT head 12/09/22.  1/8 - No significant neurologic/clinical change. Brief GOC discussion with husband, Pamela Johnston, at bedside in AM. Concerns from staff re: patient GOC/decisionmaking, want to ensure husband is included in decisions/information sharing. Attempted bedside meeting but family prematurely ended meeting due to escalated discussion. 1/9 - Febrile overnight to 101.21F, remains on bromocriptine. No significant neurologic exam changes. 1/10 Family meeting yesterday with [name], plan for trach/peg though husband says they are still discussing this morning. Per Dt. Hulen Skains " So moving forward, [name] is the designated medical decision maker for Pamela Johnston " 1/11 no new issues, plan for PEG tomorrow 1/12 for trach/peg today 1/15 No change in Neuro status , trach and PEG, No weaning , will repeat CT head without, get Case management involved in looking for LTAC ( Day 3 post trach on 1/12)   1/15-CT with some stabilization 1/19 No acute events overnight, respiratory culture 1/15 with abundant Pseudomonas, abundant Staphylococcus aureus, and moderate Enterobacter remains on Meropenem  1/22 trach collar trial 12 +  hrs 1/24 trach collar x 24 hours on 35%  Interim History / Subjective:  NAON. On ATC at 35% x 24  hours Intermittently gets tachypneic but resolves spontaneously  Objective:   Blood pressure (!) 123/57, pulse (!) 115, temperature 98.8 F (37.1 C), temperature source Oral, resp. rate (!) 30, height 5\' 1"  (1.549 m), weight 58.3 kg, SpO2 98 %.    FiO2 (%):  [35 %] 35 %   Intake/Output Summary (Last 24 hours) at 01/02/2023 0845 Last data filed at 01/02/2023 0600 Gross per 24 hour  Intake 1210 ml  Output 2145 ml  Net -935 ml    Filed Weights   12/29/22 0353 12/30/22 0449 12/31/22 0500  Weight: 60 kg 60.4 kg 58.3 kg   Physical Exam: General: Chronically ill-appearing, no acute distress HENT: [place], AT, OP clear, MMM Neck: Cuffed #6 shiley in place Eyes: EOMI, no scleral icterus Respiratory: Clear to auscultation bilaterally.  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/mass effect Brain compression/ICH Right parietal lobe hemorrhage with associated edema  Severe encephalopathy Intraventricular hemorrhage and mass effect, vasogenic edema, subfalcine herniation -Multiple family meetings concluded over time with CMO on board, medical decision maker is 01/02/23 patient's son-in-law -Stroke team signed off P: Maintain neuro protective measures Nutrition and bowel regimen Aspiration precautions   Acute respiratory failure with hypoxia due to large intracerebral hemorrhage Pneumonia  -Previously completed 10 days of antibiotic coverage for anterobasilar pneumonia, repeat sputum culture obtained 1/15 positive for abundant Pseudomonas, abundant Staphylococcus  aureus, and moderate Enterobacter, s/p course of Meropenem P: Continue ATC as tolerated, hope to avoid vent needs Wean O2 down Bronchial hygiene  Urinary retention -Was on Urecholine for 10mg  TID from 1/4 - 1/14 dose was then increased to 25mg  TID from 1/14 - 1/17 with continue retention seen  -Foley was taken out 12/26/2022 -Bladder scan overnight1/18 with 2/14 urine retained, new foley placed -New foley placed 1/23 overnight  P: Continue foley indefinitely now failed multiple TOV Will need urologic follow up as outpatient  History of chronic sacroiliitis Osteopenia RLS P: Supportive care  Volume Overload: Dueto IVF, salt load with critical illness P: - Lasix PRN  GOC 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  Anticipate severe long term disability without chance of independent function.  - PMT following, appreciate assistance with management - Family continues to desire full code status/full scope of care. Husband does have some dementia    Can transfer to progressive for continued ATC needs, O2 weaning. Will ask TRH to assume care in AM 1/25. PCCM to see 1-2x per week for vent needs Hoping will be able to stay off vent Needs ongoing family discussions re goals of care   Best Practice (right click and "Reselect all SmartList Selections" daily)   Diet/type: tubefeeds DVT prophylaxis: SCD GI prophylaxis: H2B Lines: N/A Foley:  Yes, and it is still needed Code Status:  full code Last date of multidisciplinary goals of care discussion: (son in law) - pall meet 12/15/22 - full code.  Meeting with 2/25 CMO 1/9  Critical care: N/A    02/13/23, PA - [place]  Pulmonary & Critical Care Medicine For pager details, please see AMION or use Epic chat  After 1900, please call [place] for cross coverage needs 01/02/2023, 8:57 AM

## 2023-01-02 NOTE — Progress Notes (Signed)
Nutrition Follow-up  DOCUMENTATION CODES:   Not applicable  INTERVENTION:   Tube feeding via PEG tube   Change Osmolite 1.2 to Jevity 1.2 @ 55 ml/h (1320 ml per day) 60 ml ProSource TF daily  Provides 1664 kcal, 102 gm protein, 1070 ml free water daily  D/C Banatrol   Juven BID  NUTRITION DIAGNOSIS:   Inadequate oral intake related to inability to eat as evidenced by NPO status. Ongoing.   GOAL:   Provide needs based on ASPEN/SCCM guidelines Met with TF at goal   MONITOR:   Vent status, TF tolerance, Labs, Weight trends  REASON FOR ASSESSMENT:   Consult Enteral/tube feeding initiation and management  ASSESSMENT:   82 year old female with PMHx of HTN, osteopenia, RLS, sacroiliitis admitted with large left hemispheric parietal/temporal IPH with vasogenic edema/mass effect, also with acute respiratory failure and concern for aspiration PNA given emesis per-intubation.  Pt discussed during ICU rounds and with RN.  TOC working on d/c plan for vent/SNF Pt now on trach collar.   01/12 - s/p trach/PEG   Current wt: 58.3 kg  Admission wt: 57.8 kg   Medications reviewed and include: pepcid, banatrol bid Fentanyl  Precedex  Labs reviewed CBG's: 103-143   Diet Order:   Diet Order             Diet NPO time specified  Diet effective midnight                   EDUCATION NEEDS:   Not appropriate for education at this time  Skin:  Skin Assessment: Skin Integrity Issues: Skin Integrity Issues:: DTI DTI: R ankle Stage I: R ear Stage II: nose  Last BM:  300 ml via FMS  Height:   Ht Readings from Last 1 Encounters:  11/29/22 5\' 1"  (1.549 m)    Weight:   Wt Readings from Last 1 Encounters:  12/31/22 58.3 kg    Ideal Body Weight:  47.7 kg  BMI:  Body mass index is 24.29 kg/m.  Estimated Nutritional Needs:   Kcal:  1500-1700  Protein:  80-100 grams  Fluid:  1.5-1.7 L/day  Lockie Pares., RD, LDN, CNSC See AMiON for contact information

## 2023-01-02 NOTE — Progress Notes (Signed)
Family notified of patient's transfer to 4NP08.   Olusegun Gerstenberger, Rande Brunt, RN

## 2023-01-03 ENCOUNTER — Inpatient Hospital Stay (HOSPITAL_COMMUNITY): Payer: Medicare PPO

## 2023-01-03 DIAGNOSIS — I619 Nontraumatic intracerebral hemorrhage, unspecified: Secondary | ICD-10-CM | POA: Diagnosis not present

## 2023-01-03 DIAGNOSIS — R0902 Hypoxemia: Secondary | ICD-10-CM | POA: Diagnosis not present

## 2023-01-03 DIAGNOSIS — R569 Unspecified convulsions: Secondary | ICD-10-CM

## 2023-01-03 LAB — BASIC METABOLIC PANEL
Anion gap: 8 (ref 5–15)
BUN: 44 mg/dL — ABNORMAL HIGH (ref 8–23)
CO2: 28 mmol/L (ref 22–32)
Calcium: 8 mg/dL — ABNORMAL LOW (ref 8.9–10.3)
Chloride: 101 mmol/L (ref 98–111)
Creatinine, Ser: 0.49 mg/dL (ref 0.44–1.00)
GFR, Estimated: >60 mL/min (ref >60)
Glucose, Bld: 162 mg/dL — ABNORMAL HIGH (ref 70–99)
Potassium: 3.8 mmol/L (ref 3.5–5.1)
Sodium: 137 mmol/L (ref 135–145)

## 2023-01-03 LAB — GLUCOSE, CAPILLARY
Glucose-Capillary: 153 mg/dL — ABNORMAL HIGH (ref 70–99)
Glucose-Capillary: 116 mg/dL — ABNORMAL HIGH (ref 70–99)
Glucose-Capillary: 111 mg/dL — ABNORMAL HIGH (ref 70–99)
Glucose-Capillary: 144 mg/dL — ABNORMAL HIGH (ref 70–99)
Glucose-Capillary: 134 mg/dL — ABNORMAL HIGH (ref 70–99)

## 2023-01-03 LAB — MAGNESIUM: Magnesium: 2.3 mg/dL (ref 1.7–2.4)

## 2023-01-03 LAB — CBC
HCT: 29.9 % — ABNORMAL LOW (ref 36.0–46.0)
Hemoglobin: 9.6 g/dL — ABNORMAL LOW (ref 12.0–15.0)
MCH: 30.8 pg (ref 26.0–34.0)
MCHC: 32.1 g/dL (ref 30.0–36.0)
MCV: 95.8 fL (ref 80.0–100.0)
Platelets: 441 10*3/uL — ABNORMAL HIGH (ref 150–400)
RBC: 3.12 MIL/uL — ABNORMAL LOW (ref 3.87–5.11)
RDW: 13.7 % (ref 11.5–15.5)
WBC: 10.2 10*3/uL (ref 4.0–10.5)
nRBC: 0 % (ref 0.0–0.2)

## 2023-01-03 MED ORDER — METOPROLOL TARTRATE 5 MG/5ML IV SOLN
5.0000 mg | Freq: Once | INTRAVENOUS | Status: AC
  Administered 2023-01-03: 5 mg via INTRAVENOUS
  Filled 2023-01-03: qty 5

## 2023-01-03 MED ORDER — BISACODYL 10 MG RE SUPP: 10.0000 mg | Freq: Every day | RECTAL | Status: DC | PRN

## 2023-01-03 MED ORDER — MORPHINE SULFATE (PF) 2 MG/ML IV SOLN
1.0000 mg | INTRAVENOUS | Status: DC | PRN
  Administered 2023-01-03 – 2023-02-05 (×11): 1 mg via INTRAVENOUS
  Filled 2023-01-03 (×11): qty 1

## 2023-01-03 MED ORDER — SODIUM CHLORIDE 0.9 % IV SOLN: INTRAVENOUS | Status: DC

## 2023-01-03 NOTE — Consult Note (Addendum)
Neurology Progress Note Reconsulted for possible seizure activity  Reason for Consult: Possible seizure activity  Referring Physician: Dr. Eliseo Squires  History is obtained from: RN and chart  HPI: Pamela Johnston is a 82 y.o. female with history of hypertension and recent large left-sided ICH who has been hospitalized since 12/21.  She has recently been able to be transition to trach collar and was transitioned out of the ICU.  Today, she was noted to have left facial twitching starting near her nose and extending to her cheek, as well as rhythmic twitching of the left foot and left arm.   ROS:  Unable to obtain due to altered mental status.   Past Medical History:  Diagnosis Date   Benign essential HTN 11/29/2022   Osteopenia 11/29/2022   RLS (restless legs syndrome) 11/29/2022   Sacroiliitis ([place]) 11/29/2022     No family history on file.   Social History:   has no history on file for tobacco use, alcohol use, and drug use.  Medications  Current Facility-Administered Medications:    0.9 %  sodium chloride infusion, 250 mL, Intravenous, Continuous, Eubanks, Katalina M, NP, Stopped at 01/01/23 0424   0.9 %  sodium chloride infusion, , Intravenous, Continuous, Geradine Girt, DO, Last Rate: 50 mL/hr at 01/03/23 1624, New Bag at 01/03/23 1624   acetaminophen (TYLENOL) tablet 650 mg, 650 mg, Oral, Q4H PRN **OR** acetaminophen (TYLENOL) 160 MG/5ML solution 650 mg, 650 mg, Per Tube, Q4H PRN, 650 mg at 01/03/23 1624 **OR** acetaminophen (TYLENOL) suppository 650 mg, 650 mg, Rectal, Q4H PRN, Greta Doom, MD   bisacodyl (DULCOLAX) suppository 10 mg, 10 mg, Rectal, Daily PRN, Vann, Jessica U, DO   Chlorhexidine Gluconate Cloth 2 % PADS 6 each, 6 each, Topical, Daily, Greta Doom, MD, 6 each at 01/02/23 2131   docusate (COLACE) 50 MG/5ML liquid 100 mg, 100 mg, Per Tube, BID PRN, Olalere, Adewale A, MD   famotidine (PEPCID) 40 MG/5ML suspension 20 mg, 20 mg, Per Tube, QHS,  [name], Nathaneil Canary B, MD, 20 mg at 01/02/23 2221   feeding supplement (JEVITY 1.2 CAL) liquid 1,000 mL, 1,000 mL, Per Tube, Continuous, Hunsucker, Bonna Gains, MD, Last Rate: 55 mL/hr at 01/02/23 1418, 1,000 mL at 01/02/23 1418   feeding supplement (PROSource TF20) liquid 60 mL, 60 mL, Per Tube, Daily, Olalere, Adewale A, MD, 60 mL at 01/03/23 1019   latanoprost (XALATAN) 0.005 % ophthalmic solution 1 drop, 1 drop, Both Eyes, QHS, Xu, Jindong, MD, 1 drop at 01/02/23 2131   morphine (PF) 2 MG/ML injection 1 mg, 1 mg, Intravenous, Q3H PRN, Vann, Jessica U, DO, 1 mg at 01/03/23 1510   nutrition supplement (JUVEN) (JUVEN) powder packet 1 packet, 1 packet, Per Tube, BID BM, Hunsucker, Bonna Gains, MD, 1 packet at 01/03/23 1418   Oral care mouth rinse, 15 mL, Mouth Rinse, Q2H, Kirkpatrick, Vida Roller, MD, 15 mL at 01/03/23 1418   Oral care mouth rinse, 15 mL, Mouth Rinse, PRN, Greta Doom, MD   polyethylene glycol (MIRALAX / GLYCOLAX) packet 17 g, 17 g, Per Tube, Daily PRN, Olalere, Adewale A, MD   rosuvastatin (CRESTOR) tablet 20 mg, 20 mg, Per Tube, Daily, Rosalin Hawking, MD, 20 mg at 01/03/23 1019   Exam: Current vital signs: BP (!) 186/94   Pulse 100   Temp (!) 100.5 F (38.1 C) (Axillary)   Resp (!) 30   Ht 5\' 1"  (1.549 m)   Wt 59.7 kg   SpO2 98%  BMI 24.87 kg/m  Vital signs in last 24 hours: Temp:  [97.9 F (36.6 C)-100.5 F (38.1 C)] 100.5 F (38.1 C) (01/25 1536) Pulse Rate:  [99-147] 100 (01/25 1629) Resp:  [20-41] 30 (01/25 1629) BP: (91-186)/(42-94) 186/94 (01/25 1545) SpO2:  [93 %-100 %] 98 % (01/25 1629) FiO2 (%):  [30 %-35 %] 30 % (01/25 1545) Weight:  [59.7 kg] 59.7 kg (01/25 0341)  GENERAL: Eyes closed, does not respond to voice or touch but does respond to sternal rub Head: Normocephalic and atraumatic, without obvious abnormality EENT: Tracheostomy in place LUNGS: Normal respiratory effort. Non-labored breathing on trach collar CV: Regular rate and rhythm on  telemetry Extremities: warm, well perfused, without obvious deformity  NEURO:  Mental Status: Patient remains with eyes closed and is unresponsive to voice or light touch.  Will move to sternal rub.  Nonverbal. Unable to follow commands. Cranial Nerves:  Pupils equal round and reactive, positive oculocephalic reflex, face appears symmetrical at rest XII: Tongue protrudes midline without fasciculations.   Motor: Will withdraw bilateral upper extremities, left more than right, as well as left lower extremity to noxious stimuli. Does not move right lower extremity to noxious stimuli.  Some twitching of left foot noted after noxious stimuli Sensation: Appears intact to noxious stimuli in bilateral upper extremities and left lower extremity Cerebellar/Gait: Unable to assess  Labs I have reviewed labs in epic and the results pertinent to this consultation are:   CBC    Component Value Date/Time   WBC 10.2 01/03/2023 0332   RBC 3.12 (L) 01/03/2023 0332   HGB 9.6 (L) 01/03/2023 0332   HCT 29.9 (L) 01/03/2023 0332   PLT 441 (H) 01/03/2023 0332   MCV 95.8 01/03/2023 0332   MCH 30.8 01/03/2023 0332   MCHC 32.1 01/03/2023 0332   RDW 13.7 01/03/2023 0332   LYMPHSABS 1.6 12/25/2022 0900   MONOABS 0.8 12/25/2022 0900   EOSABS 0.3 12/25/2022 0900   BASOSABS 0.1 12/25/2022 0900    CMP     Component Value Date/Time   NA 137 01/03/2023 0322   K 3.8 01/03/2023 0322   CL 101 01/03/2023 0322   CO2 28 01/03/2023 0322   GLUCOSE 162 (H) 01/03/2023 0322   BUN 44 (H) 01/03/2023 0322   CREATININE 0.49 01/03/2023 0322   CALCIUM 8.0 (L) 01/03/2023 0322   PROT 6.0 (L) 12/27/2022 0506   ALBUMIN 2.0 (L) 12/27/2022 0506   AST 78 (H) 12/27/2022 0506   ALT 87 (H) 12/27/2022 0506   ALKPHOS 111 12/27/2022 0506   BILITOT 0.4 12/27/2022 0506   GFRNONAA >60 01/03/2023 0322    Lipid Panel     Component Value Date/Time   CHOL 178 12/01/2022 0645   TRIG 103 12/03/2022 0601   HDL 58 12/01/2022 0645    CHOLHDL 3.1 12/01/2022 0645   VLDL 13 12/01/2022 0645   LDLCALC 107 (H) 12/01/2022 0645     Imaging I have reviewed the images obtained:  CT-scan of the brain 1/15: Unresolved large left hemisphere ICH with 15 mm of rightward midline shift  Assessment: 82 year old patient with history of hypertension and large left hemisphere ICH who has been hospitalized since last month, who is experiencing new onset twitching of left side of face, left arm and left foot.  Patient has not been on any AEDs.   - On exam, slight facial twitching near patient's nose on left side was noted, as well as twitching of left foot after stimulation.  Per RN, twitching occurs  at rest as well as after stimulation.   - Given patient's large ICH, seizure activity is certainly possible.   - Will obtain EEG for spell capture.  Impression: Possible seizure activity in patient with recent large left hemispheric ICH  Recommendations: -EEG for spell capture. Change to continuous EEG if spot EEG is unrevealing  -Initiate AEDs if seizure activity seen on EEG  Addendum: EEG reveals right frontal sharp wave and continuous generalized slowing, maximal left frontotemporal . The study shows evidence of epileptogenicity arising from right frontal region with increased risk of seizure recurrence. Additionally there is cortical dysfunction arising from left frontotemporal region likely secondary to underlying structural abnormality. Lastly there is moderate diffuse encephalopathy, nonspecific etiology. No seizures were seen throughout the recording.   Pt seen by NP/Neuro and later by MD.   Marjorie Smolder , MSN, AGACNP-BC [facility] See Amion for schedule and pager information 01/03/2023 4:57 PM   Electronically signed: Dr. Caryl Pina

## 2023-01-03 NOTE — Progress Notes (Addendum)
Came back to evaluate patient due to tachycardia (higher than baseline 110s) -xray of lungs- does not appear to be infection -sputum cx ordered- recently treated with abx -will do gentle IVF for now as she has gotten lasix over last several days and may be dry -may need initiation of a BB if not cause found -EKG sinus tach -no CBC recently so will order -discussed with critical care MD -patient having some twitching on her left leg and left side of her face-- will check EEG as well- neuro to see  Eulogio Bear DO

## 2023-01-03 NOTE — Procedures (Addendum)
Patient Name: Pamela Johnston  MRN: [id number]  Epilepsy Attending: Lora Havens  Referring Physician/Provider: Geradine Girt, DO  Date: 01/03/2023 Duration: 42.20 mins  Patient history: 82 year old patient with history of hypertension and large left hemisphere ICH who has been hospitalized since last month, who is experiencing new onset twitching of left side of face, left arm and left foot.  Patient has not been on any AEDs.  EEG to evaluate for seizure  Level of alertness: lethargic   AEDs during EEG study: None  Technical aspects: This EEG study was done with scalp electrodes positioned according to the 10-20 International system of electrode placement. Electrical activity was reviewed with band pass filter of 1-70Hz , sensitivity of 7 uV/mm, display speed of 68mm/sec with a 60Hz  notched filter applied as appropriate. EEG data were recorded continuously and digitally stored.  Video monitoring was available and reviewed as appropriate.  Description: EEG showed continuous generalized and maximal left frontotemporal 3 to 6 Hz theta-delta slowing. Abundant sharp waves were noted in right frontal region, maximal F8/F4> FP2/T8, at times qasi periodic at 1hz  and rhythmic. Hyperventilation and photic stimulation were not performed.     ABNORMALITY - Sharp wave, right frontal - Continuous slow, generalized and maximal left frontotemporal   IMPRESSION: This study showed evidence of epileptogenicity arising from right frontal region with increased risk of seizure recurrence. Additionally there is cortical dysfunction arising from left frontotemporal region likely secondary to underlying structural abnormality. Lastly there is moderate diffuse encephalopathy, nonspecific etiology. No seizures were seen throughout the recording.   Dhanya Bogle Barbra Sarks

## 2023-01-03 NOTE — Care Management Important Message (Signed)
Important Message  Patient Details  Name: Pamela Johnston MRN: [id number] Date of Birth: 1941/10/18   Medicare Important Message Given:  Yes     Hannah Beat 01/03/2023, 11:24 AM

## 2023-01-03 NOTE — TOC Progression Note (Signed)
Transition of Care [facility]) - Progression Note    Patient Details  Name: Pamela Johnston MRN: [id number] Date of Birth: 03-24-1941  Transition of Care [facility]) CM/SW [place], [place] Phone Number: 01/03/2023, 10:58 AM  Clinical Narrative:     TOC continues to follow  Expected Discharge Plan: [place] Barriers to Discharge: Insurance Authorization, SNF Pending bed offer  Expected Discharge Plan and Services In-house Referral: Clinical Social Work Discharge Planning Services: CM Consult Post Acute Care Choice: [place] arrangements for the past 2 months: Single Family Home                                       Social Determinants of Health (SDOH) Interventions    Readmission Risk Interventions     No data to display

## 2023-01-03 NOTE — Progress Notes (Signed)
MD @ bedside, see new orders

## 2023-01-03 NOTE — Progress Notes (Addendum)
PROGRESS NOTE    Pamela Johnston  JKD:[id number] DOB: 28-Dec-1940 DOA: 11/29/2022 PCP: Pamela Cobbs, MD    Brief Narrative:  81-yer-old woman who presented to [facility] 12/21 as a Code Stroke. LKW 2000 when patient reportedly was in bed and became less responsive with +aphasia and R-sided weakness. One episode of nausea/vomiting en route with EMS. PMHx significant for HTN, chronic sacroiliitis (managed with Percocet), RLS, osteopenia.   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 Labs were grossly unremarkable with WBC mildly elevated to 12 and mild hyperglycemia to 154. Decision was made to intubate patient in ED after several episodes of emesis with concern for aspiration. HTS 3% was initiated.  12/21 - Presented to [facility] via EMS as Code Stroke. LKW 2000. Abrupt onset of decreased responsiveness, aphasia and R hemiplegia. Stroke admitting, NSGY consulted. PCCM consult for vent management. Treated with unasyn for aspiration pneumonia. CT Head with large L hemispheric parietal/temporal lobe IPH with extension into basal ganglia and IVH. HTS 3% started.   12/24 - Palliative care consulted, family requested meeting to be delayed 12/25 - Family requested goals of care meeting to be delayed 12/26 - Stopped Unasyn, replete phos; [facility] GOC conversation: family says full code 12/27 - Restarted Unasyn for fever, RLL infiltrate; resp culture: enterobacter, MDR. Palliative consulted  12/28 - CT Head unchanged 12/29 - Unasyn changed to bactrim 12/31 - Posturing, CT head with increased shift, increased bleeding 1/2 - Family fired CVA service after discussion RE poor prognosis  1/4 - Off sedation  1/5 - No acute issues overnight. Remains on vent with increased respirations off sedation. Fever curve  slightly decreased  1/6 - Pall care meeting moved to 2pm today. Pamela Johnston the son-in-law will be present. On ven 30%/ On TF. Neo not started last night. MAP > 65 after fluids bolus. Febrile +.  Last CT head 12/09/22.  1/8 - No significant neurologic/clinical change. Brief GOC discussion with husband, Pamela Johnston, at bedside in AM. Concerns from staff re: patient GOC/decisionmaking, want to ensure husband is included in decisions/information sharing. Attempted bedside meeting but family prematurely ended meeting due to escalated discussion. 1/9 - Febrile overnight to 101.83F, remains on bromocriptine. No significant neurologic exam changes. 1/10 Family meeting yesterday with Pamela Johnston, plan for trach/peg though husband says they are still discussing this morning. Per Dt. Lindie Johnston " So moving forward, Pamela Johnston is the designated medical decision maker for [facility] " 1/11 no new issues, plan for PEG tomorrow 1/12 for trach/peg today 1/15 No change in Neuro status , trach and PEG, No weaning , will repeat CT head without, get Case management involved in looking for LTAC ( Day 3 post trach on 1/12)   1/15-CT with some stabilization 1/19 No acute events overnight, respiratory culture 1/15 with abundant Pseudomonas, abundant Staphylococcus aureus, and moderate Enterobacter remains on Meropenem  1/22 trach collar trial 12 + hrs 1/24 trach collar x 24 hours on 35%  Assessment and Plan: 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/mass effect Brain compression/ICH Right parietal lobe hemorrhage with associated edema  Severe encephalopathy Intraventricular hemorrhage and mass effect, vasogenic edema, subfalcine herniation -Multiple family meetings concluded over time with CMO on board, medical decision maker is Pamela Johnston patient's son-in-law -Stroke team signed off Maintain neuro protective measures Nutrition and  bowel regimen Aspiration precautions    Acute  respiratory failure with hypoxia due to large intracerebral hemorrhage Pneumonia  -Previously completed 10 days of antibiotic coverage for anterobasilar pneumonia, repeat sputum culture obtained 1/15 positive for abundant Pseudomonas, abundant Staphylococcus aureus, and moderate Enterobacter, s/p course of Meropenem -Continue ATC as tolerated, hope to avoid vent needs -Wean O2 down -Bronchial hygiene   Urinary retention -Was on Urecholine for 10mg  TID from 1/4 - 1/14 dose was then increased to 25mg  TID from 1/14 - 1/17 with continue retention seen  -Foley was taken out 12/26/2022 -New foley placed 1/23 Will need urologic follow up as outpatient   History of chronic sacroiliitis Osteopenia RLS -Supportive care   Volume Overload: Due to IVF, salt load with critical illness - Lasix PRN    Pressure Injury 12/19/22 Nose Left;Mid Stage 2 -  Partial thickness loss of dermis presenting as a shallow open injury with a red, pink wound bed without slough. Stage 2 MDRI from ETT tube holder on septum area (Active)  12/19/22 2000  Location: Nose  Location Orientation: Left;Mid  Staging: Stage 2 -  Partial thickness loss of dermis presenting as a shallow open injury with a red, pink wound bed without slough.  Wound Description (Comments): Stage 2 MDRI from ETT tube holder on septum area  Present on Admission: No     Pressure Injury 12/22/22 Ear Right Stage 1 -  Intact skin with non-blanchable redness of a localized area usually over a bony prominence. (Active)  12/22/22 1816  Location: Ear  Location Orientation: Right  Staging: Stage 1 -  Intact skin with non-blanchable redness of a localized area usually over a bony prominence.  Wound Description (Comments):   Present on Admission: No     Pressure Injury 12/28/22 Ankle Anterior;Right Deep Tissue Pressure Injury - Purple or maroon localized area of discolored intact skin or blood-filled blister due to damage of underlying soft tissue from  pressure and/or shear. Pink abrasion with deep maro (Active)  12/28/22 1530  Location: Ankle  Location Orientation: Anterior;Right  Staging: Deep Tissue Pressure Injury - Purple or maroon localized area of discolored intact skin or blood-filled blister due to damage of underlying soft tissue from pressure and/or shear.  Wound Description (Comments): Pink abrasion with deep maroon center on right lateral ankle  Present on Admission: No         DVT prophylaxis: SCD's Start: 11/29/22 2215    Code Status: Full Code  Disposition Plan:  Level of care: Progressive Status is: Inpatient Remains inpatient appropriate because: needs off trach collar to go to SNF    Consultants:  PCCM Palliative care neurology   Subjective: Not responsive  Objective: Vitals:   01/03/23 0341 01/03/23 0430 01/03/23 0748 01/03/23 0838  BP:   (!) 116/56 (!) 115/56  Pulse:   (!) 111 (!) 107  Resp:  (!) 26 (!) 25 (!) 24  Temp:   98.2 F (36.8 C)   TempSrc:   Axillary   SpO2:   99% 98%  Weight: 59.7 kg     Height:        Intake/Output Summary (Last 24 hours) at 01/03/2023 1138 Last data filed at 01/03/2023 5093 Gross per 24 hour  Intake 1825.17 ml  Output 1625 ml  Net 200.17 ml   Filed Weights   12/30/22 0449 12/31/22 0500 01/03/23 0341  Weight: 60.4 kg 58.3 kg 59.7 kg    Examination:   General: Appearance:    elderly female in no acute  distress     Lungs:     On trach collar, respirations unlabored  Heart:    Tachycardic.    MS:   All extremities are intact.    Neurologic:   Eyes close, does not respond to voice or noxious stimuli, not following commands       Data Reviewed: I have personally reviewed following labs and imaging studies  CBC: Recent Labs  Lab 12/29/22 0833 12/30/22 0454  WBC 11.7* 11.3*  HGB 9.4* 8.8*  HCT 30.3* 27.5*  MCV 97.7 96.8  PLT 284 767   Basic Metabolic Panel: Recent Labs  Lab 12/29/22 0833 12/30/22 0454 01/02/23 0636 01/03/23 0322  NA  140 138 137 137  K 3.6 3.8 3.7 3.8  CL 104 104 100 101  CO2 28 27 28 28   GLUCOSE 157* 132* 114* 162*  BUN 28* 28* 26* 44*  CREATININE 0.52 0.52 0.64 0.49  CALCIUM 7.9* 8.0* 8.2* 8.0*   GFR: Estimated Creatinine Clearance: 47.3 mL/min (by C-G formula based on SCr of 0.49 mg/dL). Liver Function Tests: No results for input(s): "AST", "ALT", "ALKPHOS", "BILITOT", "PROT", "ALBUMIN" in the last 168 hours. No results for input(s): "LIPASE", "AMYLASE" in the last 168 hours. No results for input(s): "AMMONIA" in the last 168 hours. Coagulation Profile: No results for input(s): "INR", "PROTIME" in the last 168 hours. Cardiac Enzymes: No results for input(s): "CKTOTAL", "CKMB", "CKMBINDEX", "TROPONINI" in the last 168 hours. BNP (last 3 results) No results for input(s): "PROBNP" in the last 8760 hours. HbA1C: No results for input(s): "HGBA1C" in the last 72 hours. CBG: Recent Labs  Lab 01/02/23 1630 01/02/23 1940 01/02/23 2309 01/03/23 0336 01/03/23 0746  GLUCAP 157* 135* 120* 153* 116*   Lipid Profile: No results for input(s): "CHOL", "HDL", "LDLCALC", "TRIG", "CHOLHDL", "LDLDIRECT" in the last 72 hours. Thyroid Function Tests: No results for input(s): "TSH", "T4TOTAL", "FREET4", "T3FREE", "THYROIDAB" in the last 72 hours. Anemia Panel: No results for input(s): "VITAMINB12", "FOLATE", "FERRITIN", "TIBC", "IRON", "RETICCTPCT" in the last 72 hours. Sepsis Labs: No results for input(s): "PROCALCITON", "LATICACIDVEN" in the last 168 hours.  No results found for this or any previous visit (from the past 240 hour(s)).       Radiology Studies: No results found.      Scheduled Meds:  Chlorhexidine Gluconate Cloth  6 each Topical Daily   famotidine  20 mg Per Tube QHS   feeding supplement (PROSource TF20)  60 mL Per Tube Daily   latanoprost  1 drop Both Eyes QHS   nutrition supplement (JUVEN)  1 packet Per Tube BID BM   mouth rinse  15 mL Mouth Rinse Q2H   rosuvastatin  20  mg Per Tube Daily   Continuous Infusions:  sodium chloride Stopped (01/01/23 0424)   feeding supplement (JEVITY 1.2 CAL) 1,000 mL (01/02/23 1418)     LOS: 35 days    Time spent: 45 minutes spent on chart review, discussion with nursing staff, consultants, updating family and interview/physical exam; more than 50% of that time was spent in counseling and/or coordination of care.    Geradine Girt, DO [facility] Available via Epic secure chat 7am-7pm After these hours, please refer to coverage provider listed on [email or web address] 01/03/2023, 11:38 AM

## 2023-01-03 NOTE — Progress Notes (Signed)
Respiratory culture obtained and sent to main lab at this time. 

## 2023-01-03 NOTE — Progress Notes (Signed)
Stat  EEG in process - results pending.

## 2023-01-04 DIAGNOSIS — I619 Nontraumatic intracerebral hemorrhage, unspecified: Secondary | ICD-10-CM | POA: Diagnosis not present

## 2023-01-04 DIAGNOSIS — R569 Unspecified convulsions: Secondary | ICD-10-CM

## 2023-01-04 DIAGNOSIS — R0902 Hypoxemia: Secondary | ICD-10-CM | POA: Diagnosis not present

## 2023-01-04 LAB — GLUCOSE, CAPILLARY
Glucose-Capillary: 111 mg/dL — ABNORMAL HIGH (ref 70–99)
Glucose-Capillary: 114 mg/dL — ABNORMAL HIGH (ref 70–99)
Glucose-Capillary: 115 mg/dL — ABNORMAL HIGH (ref 70–99)
Glucose-Capillary: 116 mg/dL — ABNORMAL HIGH (ref 70–99)
Glucose-Capillary: 126 mg/dL — ABNORMAL HIGH (ref 70–99)
Glucose-Capillary: 128 mg/dL — ABNORMAL HIGH (ref 70–99)
Glucose-Capillary: 129 mg/dL — ABNORMAL HIGH (ref 70–99)

## 2023-01-04 MED ORDER — LEVETIRACETAM IN NACL 500 MG/100ML IV SOLN
500.0000 mg | Freq: Two times a day (BID) | INTRAVENOUS | Status: DC
  Administered 2023-01-04 – 2023-01-06 (×4): 500 mg via INTRAVENOUS
  Filled 2023-01-04 (×4): qty 100

## 2023-01-04 MED ORDER — METOPROLOL TARTRATE 12.5 MG HALF TABLET
12.5000 mg | ORAL_TABLET | Freq: Two times a day (BID) | ORAL | Status: DC
  Administered 2023-01-04 – 2023-02-13 (×81): 12.5 mg
  Filled 2023-01-04 (×81): qty 1

## 2023-01-04 MED ORDER — SODIUM CHLORIDE 0.9 % IV SOLN
2000.0000 mg | Freq: Once | INTRAVENOUS | Status: AC
  Administered 2023-01-04: 2000 mg via INTRAVENOUS
  Filled 2023-01-04: qty 20

## 2023-01-04 NOTE — Procedures (Signed)
Patient Name: [facility]ela [facility]  MRN: [id number]  Epilepsy Attending: Lora Havens  Referring Physician/Provider: Lora Havens, MD  Duration: 01/03/2023 2308 to 01/04/2023 2308   Patient history: 82 year old patient with history of hypertension and large left hemisphere ICH who has been hospitalized since last month, who is experiencing new onset twitching of left side of face, left arm and left foot.  Patient has not been on any AEDs.  EEG to evaluate for seizure   Level of alertness: lethargic, asleep    AEDs during EEG study: None   Technical aspects: This EEG study was done with scalp electrodes positioned according to the 10-20 International system of electrode placement. Electrical activity was reviewed with band pass filter of 1-70Hz , sensitivity of 7 uV/mm, display speed of 47mm/sec with a 60Hz  notched filter applied as appropriate. EEG data were recorded continuously and digitally stored.  Video monitoring was available and reviewed as appropriate.   Description: No clear posterior dominant rhythm was seen. Sleep was characterized by sleep spindles (12 to 14 Hz), maximal frontocentral region. EEG showed continuous generalized and maximal left frontotemporal 3 to 6 Hz theta-delta slowing. Abundant sharp waves were noted in right frontal region, maximal F8/F4> FP2/T8, at times qasi periodic at 1hz  and rhythmic. After keppra was added, frequency of sharp waves gradually improved. Hyperventilation and photic stimulation were not performed.      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 After keppra was added, frequency of sharp waves gradually improved.   Additionally there is cortical dysfunction arising from left frontotemporal region likely secondary to underlying structural abnormality.  Lastly there is moderate diffuse encephalopathy, nonspecific etiology.   No seizures were seen throughout the recording.    [facility]

## 2023-01-04 NOTE — Progress Notes (Signed)
Subjective: LTM EEG is running.   Objective: Current vital signs: BP 108/61 (BP Location: Left Arm)   Pulse (!) 111   Temp 99.8 F (37.7 C) (Axillary)   Resp (!) 26   Ht 5\' 1"  (1.549 m)   Wt 59 kg   SpO2 97%   BMI 24.58 kg/m  Vital signs in last 24 hours: Temp:  [98.6 F (37 C)-100.5 F (38.1 C)] 99.8 F (37.7 C) (01/26 0718) Pulse Rate:  [100-147] 111 (01/26 0800) Resp:  [22-41] 26 (01/26 0800) BP: (108-186)/(56-94) 108/61 (01/26 0718) SpO2:  [97 %-100 %] 97 % (01/26 0800) FiO2 (%):  [30 %-35 %] 30 % (01/26 0827) Weight:  [59 kg] 59 kg (01/26 0500)  Intake/Output from previous day: 01/25 0701 - 01/26 0700 In: 1775.8 [I.V.:580; NG/GT:1195.8] Out: 1250 [Urine:1250] Intake/Output this shift: No intake/output data recorded. Nutritional status:  Diet Order             Diet NPO time specified  Diet effective midnight                  HEENT: [place]/AT. Trach collar in place Lungs: Respirations unlabored Ext: Warm and well perfused  Neurologic Exam: Mental Status: Patient remains with eyes closed and is unresponsive to voice or light touch. Nonverbal. Unable to follow commands. Cranial Nerves:  Pupils equal, round and reactive, intact oculocephalic reflex, corneal reflexes intact. Face appears symmetrical at rest. Cough reflex present.  Motor/Sensory: Slight movement of LUE after sustained noxious stimulation. No movement of RUE to any stimuli: noxious stimuli. Slight movement of right foot and toe to pinch of RLE. Minimal movement of LLE to noxious. Triple flexion reflex to LLE also noted with plantar stimulation.  Reflexes: 1+ brachioradialis and patellar reflexes.  Cerebellar/Gait: Unable to assess  Lab Results: Results for orders placed or performed during the hospital encounter of 11/29/22 (from the past 48 hour(s))  Glucose, capillary     Status: Abnormal   Collection Time: 01/02/23 11:29 AM  Result Value Ref Range   Glucose-Capillary 111 (H) 70 - 99 mg/dL     Comment: Glucose reference range applies only to samples taken after fasting for at least 8 hours.  Glucose, capillary     Status: Abnormal   Collection Time: 01/02/23  4:30 PM  Result Value Ref Range   Glucose-Capillary 157 (H) 70 - 99 mg/dL    Comment: Glucose reference range applies only to samples taken after fasting for at least 8 hours.  Glucose, capillary     Status: Abnormal   Collection Time: 01/02/23  7:40 PM  Result Value Ref Range   Glucose-Capillary 135 (H) 70 - 99 mg/dL    Comment: Glucose reference range applies only to samples taken after fasting for at least 8 hours.  Glucose, capillary     Status: Abnormal   Collection Time: 01/02/23 11:09 PM  Result Value Ref Range   Glucose-Capillary 120 (H) 70 - 99 mg/dL    Comment: Glucose reference range applies only to samples taken after fasting for at least 8 hours.  Basic metabolic panel     Status: Abnormal   Collection Time: 01/03/23  3:22 AM  Result Value Ref Range   Sodium 137 135 - 145 mmol/L   Potassium 3.8 3.5 - 5.1 mmol/L   Chloride 101 98 - 111 mmol/L   CO2 28 22 - 32 mmol/L   Glucose, Bld 162 (H) 70 - 99 mg/dL    Comment: Glucose reference range applies only to  samples taken after fasting for at least 8 hours.   BUN 44 (H) 8 - 23 mg/dL   Creatinine, Ser 1.02 0.44 - 1.00 mg/dL   Calcium 8.0 (L) 8.9 - 10.3 mg/dL   GFR, Estimated >72 >53 mL/min    Comment: (NOTE) Calculated using the CKD-EPI Creatinine Equation (2021)    Anion gap 8 5 - 15    Comment: Performed at [facility], [address], [place], [place] [address]  CBC     Status: Abnormal   Collection Time: 01/03/23  3:32 AM  Result Value Ref Range   WBC 10.2 4.0 - 10.5 K/uL   RBC 3.12 (L) 3.87 - 5.11 MIL/uL   Hemoglobin 9.6 (L) 12.0 - 15.0 g/dL   HCT 34.7 (L) 42.5 - 95.6 %   MCV 95.8 80.0 - 100.0 fL   MCH 30.8 26.0 - 34.0 pg   MCHC 32.1 30.0 - 36.0 g/dL   RDW 38.7 56.4 - 33.2 %   Platelets 441 (H) 150 - 400 K/uL   nRBC 0.0 0.0 - 0.2 %     Comment: Performed at [facility], [address] [place], [place] [address]  Magnesium     Status: None   Collection Time: 01/03/23  3:32 AM  Result Value Ref Range   Magnesium 2.3 1.7 - 2.4 mg/dL    Comment: Performed at [facility], [address], [place], [place] [address]  Glucose, capillary     Status: Abnormal   Collection Time: 01/03/23  3:36 AM  Result Value Ref Range   Glucose-Capillary 153 (H) 70 - 99 mg/dL    Comment: Glucose reference range applies only to samples taken after fasting for at least 8 hours.  Glucose, capillary     Status: Abnormal   Collection Time: 01/03/23  7:46 AM  Result Value Ref Range   Glucose-Capillary 116 (H) 70 - 99 mg/dL    Comment: Glucose reference range applies only to samples taken after fasting for at least 8 hours.  Glucose, capillary     Status: Abnormal   Collection Time: 01/03/23 11:38 AM  Result Value Ref Range   Glucose-Capillary 111 (H) 70 - 99 mg/dL    Comment: Glucose reference range applies only to samples taken after fasting for at least 8 hours.  Culture, Respiratory w Gram Stain     Status: None (Preliminary result)   Collection Time: 01/03/23  3:47 PM   Specimen: Tracheal Aspirate; Respiratory  Result Value Ref Range   Specimen Description TRACHEAL ASPIRATE    Special Requests NONE    Gram Stain      ABUNDANT SQUAMOUS EPITHELIAL CELLS PRESENT ABUNDANT WBC PRESENT, PREDOMINANTLY PMN ABUNDANT GRAM NEGATIVE RODS Performed at [facility], [address] [place], [place] [address]    Culture PENDING    Report Status PENDING   Glucose, capillary     Status: Abnormal   Collection Time: 01/03/23  4:07 PM  Result Value Ref Range   Glucose-Capillary 144 (H) 70 - 99 mg/dL    Comment: Glucose reference range applies only to samples taken after fasting for at least 8 hours.  Glucose, capillary     Status: Abnormal   Collection Time: 01/03/23  8:23 PM  Result Value Ref Range   Glucose-Capillary 134 (H) 70 -  99 mg/dL    Comment: Glucose reference range applies only to samples taken after fasting for at least 8 hours.  Glucose, capillary  Status: Abnormal   Collection Time: 01/04/23 12:07 AM  Result Value Ref Range   Glucose-Capillary 114 (H) 70 - 99 mg/dL    Comment: Glucose reference range applies only to samples taken after fasting for at least 8 hours.  Glucose, capillary     Status: Abnormal   Collection Time: 01/04/23  3:47 AM  Result Value Ref Range   Glucose-Capillary 126 (H) 70 - 99 mg/dL    Comment: Glucose reference range applies only to samples taken after fasting for at least 8 hours.  Glucose, capillary     Status: Abnormal   Collection Time: 01/04/23  7:17 AM  Result Value Ref Range   Glucose-Capillary 111 (H) 70 - 99 mg/dL    Comment: Glucose reference range applies only to samples taken after fasting for at least 8 hours.    Recent Results (from the past 240 hour(s))  Culture, Respiratory w Gram Stain     Status: None (Preliminary result)   Collection Time: 01/03/23  3:47 PM   Specimen: Tracheal Aspirate; Respiratory  Result Value Ref Range Status   Specimen Description TRACHEAL ASPIRATE  Final   Special Requests NONE  Final   Gram Stain   Final    ABUNDANT SQUAMOUS EPITHELIAL CELLS PRESENT ABUNDANT WBC PRESENT, PREDOMINANTLY PMN ABUNDANT GRAM NEGATIVE RODS Performed at [facility], [place] [address] [place], [place] [address]    Culture PENDING  Incomplete   Report Status PENDING  Incomplete    Lipid Panel No results for input(s): "CHOL", "TRIG", "HDL", "CHOLHDL", "VLDL", "LDLCALC" in the last 72 hours.  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  Patient has not been on any AEDs.  EEG to evaluate for seizure  Level of alertness: lethargic, asleep  AEDs during EEG study: None  Technical aspects: This EEG study was done with scalp electrodes positioned according to the 10-20 International system of electrode placement. Electrical activity was reviewed with band pass filter of 1-70Hz , sensitivity of 7 uV/mm, display speed of 78mm/sec with a 60Hz  notched filter applied as appropriate. EEG data were recorded continuously and digitally stored.  Video monitoring was available and reviewed as appropriate.  Description: No clear posterior dominant rhythm was seen. Sleep was characterized by sleep spindles (12 to 14 Hz), maximal frontocentral region. EEG showed continuous generalized and maximal left frontotemporal 3 to 6 Hz theta-delta slowing. Abundant sharp waves were noted in right frontal region, maximal F8/F4> FP2/T8, at times qasi periodic at 1hz  and rhythmic. Hyperventilation and photic stimulation were not performed.    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 Additionally there is cortical dysfunction arising from left frontotemporal region likely secondary to underlying structural abnormality. Lastly there is moderate diffuse encephalopathy, nonspecific etiology. No seizures were seen throughout the recording.   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  Patient has not been on any AEDs.  EEG to evaluate for seizure Level of alertness: lethargic AEDs during EEG study: None Technical aspects: This EEG study was done with scalp electrodes positioned according to the 10-20 International system of electrode placement. Electrical activity was reviewed with band pass filter of 1-70Hz , sensitivity of 7 uV/mm, display speed of 76mm/sec with a 60Hz  notched filter applied as appropriate. EEG data were recorded continuously and digitally stored.  Video monitoring was available and reviewed as appropriate. Description: EEG showed continuous generalized and maximal left frontotemporal 3 to 6 Hz theta-delta slowing. Abundant sharp waves were noted in right frontal region, maximal F8/F4> FP2/T8, at times qasi periodic at 1hz  and rhythmic. Hyperventilation and photic stimulation were not performed.   ABNORMALITY - Sharp wave, right frontal - Continuous slow, generalized and maximal left frontotemporal IMPRESSION: This study showed evidence of epileptogenicity arising from right frontal region with increased risk of seizure recurrence. Additionally there is cortical dysfunction arising from left frontotemporal region likely secondary to underlying structural abnormality. Lastly there is moderate diffuse encephalopathy, nonspecific etiology. No seizures were seen throughout the recording.   DG CHEST PORT 1 VIEW  Result Date: 01/03/2023 CLINICAL DATA:  Fever EXAM: PORTABLE CHEST 1 VIEW COMPARISON:  12/26/2022, 12/25/2022, 12/21/2022 FINDINGS: Tracheostomy tube remains in place. Minimal atelectasis left base. No new airspace opacity. Normal cardiac size. No pneumothorax. IMPRESSION: No active disease. Minimal atelectasis at the left base. Electronically  Signed   By: 12/27/2022 M.D.   On: 01/03/2023 15:50    Medications: Scheduled:  Chlorhexidine Gluconate Cloth  6 each Topical Daily   famotidine  20 mg Per Tube QHS   feeding supplement (PROSource TF20)  60 mL Per Tube Daily   latanoprost  1 drop Both Eyes QHS   nutrition supplement (JUVEN)  1 packet Per Tube BID BM   mouth rinse  15 mL Mouth Rinse Q2H   rosuvastatin  20 mg Per Tube Daily   Continuous:  sodium chloride Stopped (01/01/23 0424)   feeding supplement (JEVITY 1.2 CAL) 55 mL/hr at 01/04/23 0700    Assessment: 81 year old patient with history of hypertension and large left hemisphere ICH who has been hospitalized since last month. On Thursday,  she began to exhibit new onset twitching of left side of face, left arm and left foot.  Patient has not been on any AEDs.   - On yesterday's exam, slight facial twitching near patient's nose on left side was noted, as well as twitching of left foot after stimulation.   - On today's examination, she remains obtunded. 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 A smaller right parietal intra-axial hematoma has more substantially decreased since last month. Ongoing significant intracranial mass effect. Rightward midline shift now 15 mm (previously 17 mm). Slightly improved suprasellar cistern patency. Decreased intraventricular blood. Stable ventricle size and configuration. - EEGs: - EEG obtained Thursday reveals right frontal sharp wave and continuous generalized slowing, maximal left frontotemporal . The study shows evidence of epileptogenicity arising from right frontal  region with increased risk of seizure recurrence. Additionally there is cortical dysfunction arising from left frontotemporal region likely secondary to underlying structural abnormality. Lastly there is moderate diffuse encephalopathy,  nonspecific etiology. No seizures were seen throughout the recording. - LTM EEG report for Friday AM: Sharp wave, right frontal; continuous slow, generalized and maximal left frontotemporal. This study showed evidence of epileptogenicity arising from right frontal region with increased risk of seizure recurrence. Additionally there is cortical dysfunction arising from left frontotemporal region likely secondary to underlying structural abnormality. Lastly there is moderate diffuse encephalopathy, nonspecific etiology. No seizures were seen throughout the recording. - Given patient's large ICH, the unilateral twitching and her EEG findings, the most likely etiology for the new onset twitching is epileptic seizure activity       Recommendations: - Starting Keppra with 2000 mg IV load followed by 500 mg IV BID - Continue LTM EEG to assess for possible improvement with anticonvulsant      LOS: 36 days   @Electronically  signed: Dr. 01/04/2023  8:35 AM

## 2023-01-04 NOTE — TOC Progression Note (Signed)
Transition of Care [facility]) - Progression Note    Patient Details  Name: Pamela Johnston MRN: [id number] Date of Birth: [birth year]  Transition [facility]) CM/SW [place], LCSW Phone Number: 01/04/2023, 3:00 PM  Clinical Narrative:    CSW received request to assist in getting incapacity letter signed for patient's Medicaid application. CSW sent signed letter to [place] as requested.    Expected Discharge Plan: Skilled Nursing Facility Barriers to Discharge: Ship broker, SNF Pending bed offer  Expected Discharge Plan and Services In-house Referral: Clinical Social Work Discharge Planning Services: CM Consult Post Acute Care Choice: [place] arrangements for the past 2 months: Single Family Home                                       Social Determinants of Health (SDOH) Interventions    Readmission Risk Interventions     No data to display

## 2023-01-04 NOTE — Progress Notes (Signed)
vLTM maintenance  All impedances below 10kohms  No skin breakdown noted at  [place]

## 2023-01-04 NOTE — Progress Notes (Signed)
PROGRESS NOTE    SEDALIA ZINGARO  [id number] DOB: Apr 09, 1941 DOA: 11/29/2022 PCP: [name], MD    Brief Narrative:  82-yer-old woman who presented to [facility] ED 12/21 as a Code Stroke. LKW 2000 when patient reportedly was in bed and became less responsive with +aphasia and R-sided weakness. One episode of nausea/vomiting en route with EMS. PMHx significant for HTN, chronic sacroiliitis (managed with Percocet), RLS, osteopenia.   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 Labs were grossly unremarkable with WBC mildly elevated to 12 and mild hyperglycemia to 154. Decision was made to intubate patient in ED after several episodes of emesis with concern for aspiration. HTS 3% was initiated.  12/21 - Presented to [facility] ED via EMS as Code Stroke. LKW 2000. Abrupt onset of decreased responsiveness, aphasia and R hemiplegia. Stroke admitting, NSGY consulted. PCCM consult for vent management. Treated with unasyn for aspiration pneumonia. CT Head with large L hemispheric parietal/temporal lobe IPH with extension into basal ganglia and IVH. HTS 3% started.   12/24 - Palliative care consulted, family requested meeting to be delayed 12/25 - Family requested goals of care meeting to be delayed 12/26 - Stopped Unasyn, replete phos; [name] GOC conversation: family says full code 12/27 - Restarted Unasyn for fever, RLL infiltrate; resp culture: enterobacter, MDR. Palliative consulted  12/28 - CT Head unchanged 12/29 - Unasyn changed to bactrim 12/31 - Posturing, CT head with increased shift, increased bleeding 1/2 - Family fired CVA service after discussion RE poor prognosis  1/4 - Off sedation  1/5 - No acute issues overnight. Remains on vent with increased respirations off sedation. Fever curve  slightly decreased  1/6 - Pall care meeting moved to 2pm today. [name] the son-in-law will be present. On ven 30%/ On TF. Neo not started last night. MAP > 65 after fluids bolus. Febrile +.  Last CT head 12/09/22.  1/8 - No significant neurologic/clinical change. Brief GOC discussion with husband, [name], at bedside in AM. Concerns from staff re: patient GOC/decisionmaking, want to ensure husband is included in decisions/information sharing. Attempted bedside meeting but family prematurely ended meeting due to escalated discussion. 1/9 - Febrile overnight to 101.63F, remains on bromocriptine. No significant neurologic exam changes. 1/10 Family meeting yesterday with [name], plan for trach/peg though husband says they are still discussing this morning. Per Dt. Hulen Skains " So moving forward, [name] is the designated medical decision maker for Felicia Newcomb " 1/11 no new issues, plan for PEG tomorrow 1/12 for trach/peg today 1/15 No change in Neuro status , trach and PEG, No weaning , will repeat CT head without, get Case management involved in looking for LTAC ( Day 3 post trach on 1/12)   1/15-CT with some stabilization 1/19 No acute events overnight, respiratory culture 1/15 with abundant Pseudomonas, abundant Staphylococcus aureus, and moderate Enterobacter remains on Meropenem  1/22 trach collar trial 12 + hrs 1/24 trach collar x 24 hours on 35%   Assessment and Plan: 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/mass effect Brain compression/ICH Right parietal lobe hemorrhage with associated edema  Severe encephalopathy Intraventricular hemorrhage and mass effect, vasogenic edema, subfalcine herniation -Multiple family meetings concluded over time with CMO on board, medical decision maker is [name] patient's son-in-law -Stroke team signed off Maintain neuro protective measures Nutrition  and bowel regimen Aspiration precautions     Seizures -EEG done -started on keppra -monitor for improvement  Acute respiratory failure with hypoxia due to large intracerebral hemorrhage Pneumonia  -Previously completed 10 days of antibiotic coverage for anterobasilar pneumonia, repeat sputum culture obtained 1/15 positive for abundant Pseudomonas, abundant Staphylococcus aureus, and moderate Enterobacter, s/p course of Meropenem -Continue ATC as tolerated, hope to avoid vent needs -Wean O2 down as tolerated -Bronchial hygiene -culture still with pseudomonas-- ? Colonized now-- will hold on abx for now   Urinary retention -Was on Urecholine for 10mg  TID from 1/4 - 1/14 dose was then increased to 25mg  TID from 1/14 - 1/17 with continue retention seen  -Foley was taken out 12/26/2022 -New foley placed 1/23 Will need urologic follow up as outpatient   History of chronic sacroiliitis Osteopenia RLS -Supportive care    Pressure Injury 12/19/22 Nose Left;Mid Stage 2 -  Partial thickness loss of dermis presenting as a shallow open injury with a red, pink wound bed without slough. Stage 2 MDRI from ETT tube holder on septum area (Active)  12/19/22 2000  Location: Nose  Location Orientation: Left;Mid  Staging: Stage 2 -  Partial thickness loss of dermis presenting as a shallow open injury with a red, pink wound bed without slough.  Wound Description (Comments): Stage 2 MDRI from ETT tube holder on septum area  Present on Admission: No     Pressure Injury 12/22/22 Ear Right Stage 1 -  Intact skin with non-blanchable redness of a localized area usually over a bony prominence. (Active)  12/22/22 1816  Location: Ear  Location Orientation: Right  Staging: Stage 1 -  Intact skin with non-blanchable redness of a localized area usually over a bony prominence.  Wound Description (Comments):   Present on Admission: No     Pressure Injury 12/28/22 Ankle Anterior;Right Deep Tissue Pressure Injury - Purple or maroon localized area of  discolored intact skin or blood-filled blister due to damage of underlying soft tissue from pressure and/or shear. Pink abrasion with deep maro (Active)  12/28/22 1530  Location: Ankle  Location Orientation: Anterior;Right  Staging: Deep Tissue Pressure Injury - Purple or maroon localized area of discolored intact skin or blood-filled blister due to damage of underlying soft tissue from pressure and/or shear.  Wound Description (Comments): Pink abrasion with deep maroon center on right lateral ankle  Present on Admission: No         DVT prophylaxis: SCD's Start: 11/29/22 2215    Code Status: Full Code  Disposition Plan:  Level of care: Progressive Status is: Inpatient Remains inpatient appropriate because: needs off trach collar to go to SNF    Consultants:  PCCM Palliative care neurology   Subjective: Hooked to LTEEG  Objective: Vitals:   01/04/23 0500 01/04/23 0718 01/04/23 0800 01/04/23 1136  BP:  108/61  (!) 121/58  Pulse:  (!) 113 (!) 111 (!) 116  Resp:  (!) 22 (!) 26 (!) 33  Temp:  99.8 F (37.7 C)  99.6 F (37.6 C)  TempSrc:  Axillary  Axillary  SpO2:  99% 97% 98%  Weight: 59 kg     Height:        Intake/Output Summary (Last 24 hours) at 01/04/2023 1222 Last data filed at 01/04/2023 1100 Gross per 24 hour  Intake 2280.07 ml  Output 1250 ml  Net 1030.07 ml   Filed Weights   12/31/22 0500 01/03/23 0341 01/04/23 0500  Weight: 58.3 kg 59.7 kg 59 kg  Examination:    General: Appearance:    Well developed, well nourished female in no acute distress     Lungs:     respirations unlabored  Heart:    Tachycardic.   MS:   All extremities are intact.   Neurologic:   Awake, alert         Data Reviewed: I have personally reviewed following labs and imaging studies  CBC: Recent Labs  Lab 12/29/22 0833 12/30/22 0454 01/03/23 0332  WBC 11.7* 11.3* 10.2  HGB 9.4* 8.8* 9.6*  HCT 30.3* 27.5* 29.9*  MCV 97.7 96.8 95.8  PLT 284 314 441*   Basic  Metabolic Panel: Recent Labs  Lab 12/29/22 0833 12/30/22 0454 01/02/23 0636 01/03/23 0322 01/03/23 0332  NA 140 138 137 137  --   K 3.6 3.8 3.7 3.8  --   CL 104 104 100 101  --   CO2 28 27 28 28   --   GLUCOSE 157* 132* 114* 162*  --   BUN 28* 28* 26* 44*  --   CREATININE 0.52 0.52 0.64 0.49  --   CALCIUM 7.9* 8.0* 8.2* 8.0*  --   MG  --   --   --   --  2.3   GFR: Estimated Creatinine Clearance: 47.1 mL/min (by C-G formula based on SCr of 0.49 mg/dL). Liver Function Tests: No results for input(s): "AST", "ALT", "ALKPHOS", "BILITOT", "PROT", "ALBUMIN" in the last 168 hours. No results for input(s): "LIPASE", "AMYLASE" in the last 168 hours. No results for input(s): "AMMONIA" in the last 168 hours. Coagulation Profile: No results for input(s): "INR", "PROTIME" in the last 168 hours. Cardiac Enzymes: No results for input(s): "CKTOTAL", "CKMB", "CKMBINDEX", "TROPONINI" in the last 168 hours. BNP (last 3 results) No results for input(s): "PROBNP" in the last 8760 hours. HbA1C: No results for input(s): "HGBA1C" in the last 72 hours. CBG: Recent Labs  Lab 01/03/23 2023 01/04/23 0007 01/04/23 0347 01/04/23 0717 01/04/23 1209  GLUCAP 134* 114* 126* 111* 128*   Lipid Profile: No results for input(s): "CHOL", "HDL", "LDLCALC", "TRIG", "CHOLHDL", "LDLDIRECT" in the last 72 hours. Thyroid Function Tests: No results for input(s): "TSH", "T4TOTAL", "FREET4", "T3FREE", "THYROIDAB" in the last 72 hours. Anemia Panel: No results for input(s): "VITAMINB12", "FOLATE", "FERRITIN", "TIBC", "IRON", "RETICCTPCT" in the last 72 hours. Sepsis Labs: No results for input(s): "PROCALCITON", "LATICACIDVEN" in the last 168 hours.  Recent Results (from the past 240 hour(s))  Culture, Respiratory w Gram Stain     Status: None (Preliminary result)   Collection Time: 01/03/23  3:47 PM   Specimen: Tracheal Aspirate; Respiratory  Result Value Ref Range Status   Specimen Description TRACHEAL  ASPIRATE  Final   Special Requests NONE  Final   Gram Stain   Final    ABUNDANT SQUAMOUS EPITHELIAL CELLS PRESENT ABUNDANT WBC PRESENT, PREDOMINANTLY PMN ABUNDANT GRAM NEGATIVE RODS    Culture   Final    ABUNDANT PSEUDOMONAS AERUGINOSA SUSCEPTIBILITIES TO FOLLOW CULTURE REINCUBATED FOR BETTER GROWTH Performed at [facility], [address] [place], [place] [address]    Report Status PENDING  Incomplete         Radiology Studies: Overnight EEG with video  Result Date: 01/04/2023 Charlsie QuestYadav, Priyanka O, MD     01/04/2023  8:33 AM Patient Name: Lyla SonJane S Orourke MRN: [id number] Epilepsy Attending: Charlsie QuestPriyanka O Yadav Referring Physician/Provider: Charlsie QuestYadav, Priyanka O, MD Duration: 01/03/2023 2308 to 01/04/2023 0830  Patient history: 82 year old patient with history of hypertension and large  left hemisphere ICH who has been hospitalized since last month, who is experiencing new onset twitching of left side of face, left arm and left foot.  Patient has not been on any AEDs.  EEG to evaluate for seizure  Level of alertness: lethargic, asleep  AEDs during EEG study: None  Technical aspects: This EEG study was done with scalp electrodes positioned according to the 10-20 International system of electrode placement. Electrical activity was reviewed with band pass filter of 1-70Hz , sensitivity of 7 uV/mm, display speed of 28mm/sec with a 60Hz  notched filter applied as appropriate. EEG data were recorded continuously and digitally stored.  Video monitoring was available and reviewed as appropriate.  Description: No clear posterior dominant rhythm was seen. Sleep was characterized by sleep spindles (12 to 14 Hz), maximal frontocentral region. EEG showed continuous generalized and maximal left frontotemporal 3 to 6 Hz theta-delta slowing. Abundant sharp waves were noted in right frontal region, maximal F8/F4> FP2/T8, at times qasi periodic at 1hz  and rhythmic. Hyperventilation and photic stimulation were not  performed.    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 Additionally there is cortical dysfunction arising from left frontotemporal region likely secondary to underlying structural abnormality. Lastly there is moderate diffuse encephalopathy, nonspecific etiology. No seizures were seen throughout the recording.     EEG adult  Result Date: 01/03/2023 Charlsie Quest, MD     01/04/2023  8:22 AM Patient Name: LOREDANA MEDELLIN MRN: [birth year]/06/2023 Epilepsy Attending: Lyla Son Referring Physician/Provider: [id number], DO Date: 01/03/2023 Duration: 42.20 mins Patient history: 82 year old patient with history of hypertension and large left hemisphere ICH who has been hospitalized since last month, who is experiencing new onset twitching of left side of face, left arm and left foot.  Patient has not been on any AEDs.  EEG to evaluate for seizure Level of alertness: lethargic AEDs during EEG study: None Technical aspects: This EEG study was done with scalp electrodes positioned according to the 10-20 International system of electrode placement. Electrical activity was reviewed with band pass filter of 1-70Hz , sensitivity of 7 uV/mm, display speed of 2mm/sec with a 60Hz  notched filter applied as appropriate. EEG data were recorded continuously and digitally stored.  Video monitoring was available and reviewed as appropriate. Description: EEG showed continuous generalized and maximal left frontotemporal 3 to 6 Hz theta-delta slowing. Abundant sharp waves were noted in right frontal region, maximal F8/F4> FP2/T8, at times qasi periodic at 1hz  and rhythmic. Hyperventilation and photic stimulation were not performed.   ABNORMALITY - Sharp wave, right frontal - Continuous slow, generalized and maximal left  frontotemporal IMPRESSION: This study showed evidence of epileptogenicity arising from right frontal region with increased risk of seizure recurrence. Additionally there is cortical dysfunction arising from left frontotemporal region likely secondary to underlying structural abnormality. Lastly there is moderate diffuse encephalopathy, nonspecific etiology. No seizures were seen throughout the recording. 76   DG CHEST PORT 1 VIEW  Result Date: 01/03/2023 CLINICAL DATA:  Fever EXAM: PORTABLE CHEST 1 VIEW COMPARISON:  12/26/2022, 12/25/2022, 12/21/2022 FINDINGS: Tracheostomy tube remains in place. Minimal atelectasis left base. No new airspace opacity. Normal cardiac size. No pneumothorax. IMPRESSION: No active disease. Minimal atelectasis at the left base. Electronically Signed   By: 01/05/2023 M.D.   On: 01/03/2023 15:50  Scheduled Meds:  Chlorhexidine Gluconate Cloth  6 each Topical Daily   famotidine  20 mg Per Tube QHS   feeding supplement (PROSource TF20)  60 mL Per Tube Daily   latanoprost  1 drop Both Eyes QHS   nutrition supplement (JUVEN)  1 packet Per Tube BID BM   mouth rinse  15 mL Mouth Rinse Q2H   rosuvastatin  20 mg Per Tube Daily   Continuous Infusions:  sodium chloride Stopped (01/01/23 0424)   feeding supplement (JEVITY 1.2 CAL) 55 mL/hr at 01/04/23 1100   levETIRAcetam       LOS: 36 days    Time spent: 45 minutes spent on chart review, discussion with nursing staff, consultants, updating family and interview/physical exam; more than 50% of that time was spent in counseling and/or coordination of care.    Geradine Girt, DO [facility] Available via Epic secure chat 7am-7pm After these hours, please refer to coverage provider listed on [email or web address] 01/04/2023, 12:22 PM

## 2023-01-05 DIAGNOSIS — R569 Unspecified convulsions: Secondary | ICD-10-CM | POA: Diagnosis not present

## 2023-01-05 DIAGNOSIS — I619 Nontraumatic intracerebral hemorrhage, unspecified: Secondary | ICD-10-CM | POA: Diagnosis not present

## 2023-01-05 LAB — GLUCOSE, CAPILLARY
Glucose-Capillary: 126 mg/dL — ABNORMAL HIGH (ref 70–99)
Glucose-Capillary: 114 mg/dL — ABNORMAL HIGH (ref 70–99)
Glucose-Capillary: 111 mg/dL — ABNORMAL HIGH (ref 70–99)
Glucose-Capillary: 122 mg/dL — ABNORMAL HIGH (ref 70–99)
Glucose-Capillary: 122 mg/dL — ABNORMAL HIGH (ref 70–99)
Glucose-Capillary: 129 mg/dL — ABNORMAL HIGH (ref 70–99)

## 2023-01-05 LAB — RESPIRATORY PANEL BY PCR

## 2023-01-05 MED ORDER — GERHARDT'S BUTT CREAM
TOPICAL_CREAM | Freq: Four times a day (QID) | CUTANEOUS | Status: DC
  Administered 2023-01-11 – 2023-02-15 (×24): 1 via TOPICAL
  Filled 2023-01-05 (×9): qty 1

## 2023-01-05 MED ORDER — HYDROCORTISONE ACETATE 25 MG RE SUPP
25.0000 mg | Freq: Two times a day (BID) | RECTAL | Status: DC
  Administered 2023-01-05 – 2023-01-09 (×10): 25 mg via RECTAL
  Filled 2023-01-05 (×12): qty 1

## 2023-01-05 NOTE — Progress Notes (Signed)
PROGRESS NOTE    Pamela Johnston  XVQ:[id number] DOB: 1941/12/03 DOA: 11/29/2022 PCP: [name], MD    Brief Narrative:  82-yer-old woman who presented to [facility] ED 12/21 as a Code Stroke. LKW 2000 when patient reportedly was in bed and became less responsive with +aphasia and R-sided weakness. One episode of nausea/vomiting en route with EMS. PMHx significant for HTN, chronic sacroiliitis (managed with Percocet), RLS, osteopenia.   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 Labs were grossly unremarkable with WBC mildly elevated to 12 and mild hyperglycemia to 154. Decision was made to intubate patient in ED after several episodes of emesis with concern for aspiration. HTS 3% was initiated.  12/21 - Presented to [facility] ED via EMS as Code Stroke. LKW 2000. Abrupt onset of decreased responsiveness, aphasia and R hemiplegia. Stroke admitting, NSGY consulted. PCCM consult for vent management. Treated with unasyn for aspiration pneumonia. CT Head with large L hemispheric parietal/temporal lobe IPH with extension into basal ganglia and IVH. HTS 3% started.   12/24 - Palliative care consulted, family requested meeting to be delayed 12/25 - Family requested goals of care meeting to be delayed 12/26 - Stopped Unasyn, replete phos; [name] GOC conversation: family says full code 12/27 - Restarted Unasyn for fever, RLL infiltrate; resp culture: enterobacter, MDR. Palliative consulted  12/28 - CT Head unchanged 12/29 - Unasyn changed to bactrim 12/31 - Posturing, CT head with increased shift, increased bleeding 1/2 - Family fired CVA service after discussion RE poor prognosis  1/4 - Off sedation  1/5 - No acute issues overnight. Remains on vent with increased respirations off sedation. Fever curve  slightly decreased  1/6 - Pall care meeting moved to 2pm today. Pamela Johnston the son-in-law will be present. On ven 30%/ On TF. Neo not started last night. MAP > 65 after fluids bolus. Febrile +.  Last CT head 12/09/22.  1/8 - No significant neurologic/clinical change. Brief GOC discussion with husband, Pamela Johnston, at bedside in AM. Concerns from staff re: patient GOC/decisionmaking, want to ensure husband is included in decisions/information sharing. Attempted bedside meeting but family prematurely ended meeting due to escalated discussion. 1/9 - Febrile overnight to 101.52F, remains on bromocriptine. No significant neurologic exam changes. 1/10 Family meeting yesterday with [name], plan for trach/peg though husband says they are still discussing this morning. Per Dt. Hulen Skains " So moving forward, Pamela Johnston is the designated medical decision maker for Pamela Johnston " 1/11 no new issues, plan for PEG tomorrow 1/12 for trach/peg today 1/15 No change in Neuro status , trach and PEG, No weaning , will repeat CT head without, get Case management involved in looking for LTAC ( Day 3 post trach on 1/12)   1/15-CT with some stabilization 1/19 No acute events overnight, respiratory culture 1/15 with abundant Pseudomonas, abundant Staphylococcus aureus, and moderate Enterobacter remains on Meropenem  1/22 trach collar trial 12 + hrs 1/24 trach collar x 24 hours on 35% 1/25- concern for seizures-- keppra started after EEG done   Assessment and Plan: 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/mass effect Brain compression/ICH Right parietal lobe hemorrhage with associated edema  Severe encephalopathy Intraventricular hemorrhage and mass effect, vasogenic edema, subfalcine herniation -Multiple family meetings concluded over time with CMO on board, medical decision maker is Pamela Johnston patient's son-in-law -  Stroke team signed off Maintain neuro protective  measures Nutrition and bowel regimen Aspiration precautions    Seizures -EEG done -started on keppra -monitor for improvement  Acute respiratory failure with hypoxia due to large intracerebral hemorrhage Pneumonia  -Previously completed 10 days of antibiotic coverage for anterobasilar pneumonia, repeat sputum culture obtained 1/15 positive for abundant Pseudomonas, abundant Staphylococcus aureus, and moderate Enterobacter, s/p course of Meropenem -Continue ATC as tolerated, hope to avoid vent needs -Wean O2 down as tolerated -Bronchial hygiene -culture still with pseudomonas-- ? Colonized now-- will hold on abx for now   Urinary retention -Was on Urecholine for 10mg  TID from 1/4 - 1/14 dose was then increased to 25mg  TID from 1/14 - 1/17 with continue retention seen  -Foley was taken out 12/26/2022 -New foley placed 1/23 Will need urologic follow up as outpatient   History of chronic sacroiliitis Osteopenia RLS -Supportive care    Pressure Injury 12/19/22 Nose Left;Mid Stage 2 -  Partial thickness loss of dermis presenting as a shallow open injury with a red, pink wound bed without slough. Stage 2 MDRI from ETT tube holder on septum area (Active)  12/19/22 2000  Location: Nose  Location Orientation: Left;Mid  Staging: Stage 2 -  Partial thickness loss of dermis presenting as a shallow open injury with a red, pink wound bed without slough.  Wound Description (Comments): Stage 2 MDRI from ETT tube holder on septum area  Present on Admission: No     Pressure Injury 12/22/22 Ear Right Stage 1 -  Intact skin with non-blanchable redness of a localized area usually over a bony prominence. (Active)  12/22/22 1816  Location: Ear  Location Orientation: Right  Staging: Stage 1 -  Intact skin with non-blanchable redness of a localized area usually over a bony prominence.  Wound Description (Comments):   Present on Admission: No     Pressure Injury 12/28/22 Ankle Anterior;Right Deep  Tissue Pressure Injury - Purple or maroon localized area of discolored intact skin or blood-filled blister due to damage of underlying soft tissue from pressure and/or shear. Pink abrasion with deep maro (Active)  12/28/22 1530  Location: Ankle  Location Orientation: Anterior;Right  Staging: Deep Tissue Pressure Injury - Purple or maroon localized area of discolored intact skin or blood-filled blister due to damage of underlying soft tissue from pressure and/or shear.  Wound Description (Comments): Pink abrasion with deep maroon center on right lateral ankle  Present on Admission: No         DVT prophylaxis: SCD's Start: 11/29/22 2215    Code Status: Full Code  Disposition Plan:  Level of care: Progressive Status is: Inpatient Remains inpatient appropriate because: needs off trach collar to go to SNF  Updated son in law and daughter 1/26  Consultants:  PCCM Palliative care neurology   Subjective: No changes- no further twitching seen  Objective: Vitals:   01/05/23 0114 01/05/23 0258 01/05/23 0742 01/05/23 0855  BP:  132/63 135/64   Pulse: (!) 104 (!) 107 (!) 119 (!) 118  Resp: (!) 27 (!) 30 20 20   Temp:  99.8 F (37.7 C) 98.6 F (37 C)   TempSrc:  Axillary Oral   SpO2: 95% 96% 92% 95%  Weight:      Height:        Intake/Output Summary (Last 24 hours) at 01/05/2023 1048 Last data filed at 01/05/2023 0558 Gross per 24 hour  Intake 1353.16 ml  Output 800 ml  Net 553.16 ml   [employer]  12/31/22 0500 01/03/23 0341 01/04/23 0500  Weight: 58.3 kg 59.7 kg 59 kg    Examination:    General: Appearance:    Elderly female in no acute distress-0 trach and peg     Lungs:     respirations unlabored  Heart:    Tachycardic.   MS:   All extremities are intact.   Neurologic:   Awake, alert         Data Reviewed: I have personally reviewed following labs and imaging studies  CBC: Recent Labs  Lab 12/30/22 0454 01/03/23 0332  WBC 11.3* 10.2  HGB 8.8*  9.6*  HCT 27.5* 29.9*  MCV 96.8 95.8  PLT 314 441*   Basic Metabolic Panel: Recent Labs  Lab 12/30/22 0454 01/02/23 0636 01/03/23 0322 01/03/23 0332  NA 138 137 137  --   K 3.8 3.7 3.8  --   CL 104 100 101  --   CO2 27 28 28   --   GLUCOSE 132* 114* 162*  --   BUN 28* 26* 44*  --   CREATININE 0.52 0.64 0.49  --   CALCIUM 8.0* 8.2* 8.0*  --   MG  --   --   --  2.3   GFR: Estimated Creatinine Clearance: 47.1 mL/min (by C-G formula based on SCr of 0.49 mg/dL). Liver Function Tests: No results for input(s): "AST", "ALT", "ALKPHOS", "BILITOT", "PROT", "ALBUMIN" in the last 168 hours. No results for input(s): "LIPASE", "AMYLASE" in the last 168 hours. No results for input(s): "AMMONIA" in the last 168 hours. Coagulation Profile: No results for input(s): "INR", "PROTIME" in the last 168 hours. Cardiac Enzymes: No results for input(s): "CKTOTAL", "CKMB", "CKMBINDEX", "TROPONINI" in the last 168 hours. BNP (last 3 results) No results for input(s): "PROBNP" in the last 8760 hours. HbA1C: No results for input(s): "HGBA1C" in the last 72 hours. CBG: Recent Labs  Lab 01/04/23 1537 01/04/23 2021 01/04/23 2302 01/05/23 0339 01/05/23 0835  GLUCAP 115* 129* 116* 126* 114*   Lipid Profile: No results for input(s): "CHOL", "HDL", "LDLCALC", "TRIG", "CHOLHDL", "LDLDIRECT" in the last 72 hours. Thyroid Function Tests: No results for input(s): "TSH", "T4TOTAL", "FREET4", "T3FREE", "THYROIDAB" in the last 72 hours. Anemia Panel: No results for input(s): "VITAMINB12", "FOLATE", "FERRITIN", "TIBC", "IRON", "RETICCTPCT" in the last 72 hours. Sepsis Labs: No results for input(s): "PROCALCITON", "LATICACIDVEN" in the last 168 hours.  Recent Results (from the past 240 hour(s))  Culture, Respiratory w Gram Stain     Status: None (Preliminary result)   Collection Time: 01/03/23  3:47 PM   Specimen: Tracheal Aspirate; Respiratory  Result Value Ref Range Status   Specimen Description  TRACHEAL ASPIRATE  Final   Special Requests NONE  Final   Gram Stain   Final    ABUNDANT SQUAMOUS EPITHELIAL CELLS PRESENT ABUNDANT WBC PRESENT, PREDOMINANTLY PMN ABUNDANT GRAM NEGATIVE RODS    Culture   Final    ABUNDANT PSEUDOMONAS AERUGINOSA CONFIRMATION OF SUSCEPTIBILITIES IN PROGRESS CULTURE REINCUBATED FOR BETTER GROWTH Performed at [facility], [address] [place], [place]    Report Status PENDING  Incomplete         Radiology Studies: Overnight EEG with video  Result Date: 01/04/2023 01/06/2023, MD     01/05/2023  7:36 AM Patient Name: SHANAUTICA FORKER MRN: Lyla Son Epilepsy Attending: [id number] Referring Physician/Provider: Charlsie Quest, MD Duration: 01/03/2023 2308 to 01/04/2023 2308  Patient history: 82 year old patient with history of hypertension and large left  hemisphere ICH who has been hospitalized since last month, who is experiencing new onset twitching of left side of face, left arm and left foot.  Patient has not been on any AEDs.  EEG to evaluate for seizure  Level of alertness: lethargic, asleep  AEDs during EEG study: None  Technical aspects: This EEG study was done with scalp electrodes positioned according to the 10-20 International system of electrode placement. Electrical activity was reviewed with band pass filter of 1-70Hz , sensitivity of 7 uV/mm, display speed of 89mm/sec with a 60Hz  notched filter applied as appropriate. EEG data were recorded continuously and digitally stored.  Video monitoring was available and reviewed as appropriate.  Description: No clear posterior dominant rhythm was seen. Sleep was characterized by sleep spindles (12 to 14 Hz), maximal frontocentral region. EEG showed continuous generalized and maximal left frontotemporal 3 to 6 Hz theta-delta slowing. Abundant sharp waves were noted in right frontal region, maximal F8/F4> FP2/T8, at times qasi periodic at 1hz  and rhythmic. After keppra was added,  frequency of sharp waves gradually improved. Hyperventilation and photic stimulation were not performed.    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 After keppra was added, frequency of sharp waves gradually improved. Additionally there is cortical dysfunction arising from left frontotemporal region likely secondary to underlying structural abnormality. Lastly there is moderate diffuse encephalopathy, nonspecific etiology. No seizures were seen throughout the recording.     EEG adult  Result Date: 01/03/2023 Charlsie Quest, MD     01/04/2023  8:22 AM Patient Name: WOODIE TRUSTY MRN: 01/06/2023 Epilepsy Attending: Lyla Son Referring Physician/Provider: [id number], DO Date: 01/03/2023 Duration: 42.20 mins Patient history: 82 year old patient with history of hypertension and large left hemisphere ICH who has been hospitalized since last month, who is experiencing new onset twitching of left side of face, left arm and left foot.  Patient has not been on any AEDs.  EEG to evaluate for seizure Level of alertness: lethargic AEDs during EEG study: None Technical aspects: This EEG study was done with scalp electrodes positioned according to the 10-20 International system of electrode placement. Electrical activity was reviewed with band pass filter of 1-70Hz , sensitivity of 7 uV/mm, display speed of 27mm/sec with a 60Hz  notched filter applied as appropriate. EEG data were recorded continuously and digitally stored.  Video monitoring was available and reviewed as appropriate. Description: EEG showed continuous generalized and maximal left frontotemporal 3 to 6 Hz theta-delta slowing. Abundant sharp waves were noted in right frontal region, maximal F8/F4> FP2/T8, at times qasi periodic at 1hz  and  rhythmic. Hyperventilation and photic stimulation were not performed.   ABNORMALITY - Sharp wave, right frontal - Continuous slow, generalized and maximal left frontotemporal IMPRESSION: This study showed evidence of epileptogenicity arising from right frontal region with increased risk of seizure recurrence. Additionally there is cortical dysfunction arising from left frontotemporal region likely secondary to underlying structural abnormality. Lastly there is moderate diffuse encephalopathy, nonspecific etiology. No seizures were seen throughout the recording. 76   DG CHEST PORT 1 VIEW  Result Date: 01/03/2023 CLINICAL DATA:  Fever EXAM: PORTABLE CHEST 1 VIEW COMPARISON:  12/26/2022, 12/25/2022, 12/21/2022 FINDINGS: Tracheostomy tube remains in place. Minimal atelectasis left base. No new airspace opacity. Normal cardiac size. No pneumothorax. IMPRESSION: No active disease. Minimal atelectasis at the left  base. Electronically Signed   By: Donavan Foil M.D.   On: 01/03/2023 15:50        Scheduled Meds:  Chlorhexidine Gluconate Cloth  6 each Topical Daily   famotidine  20 mg Per Tube QHS   feeding supplement (PROSource TF20)  60 mL Per Tube Daily   latanoprost  1 drop Both Eyes QHS   metoprolol tartrate  12.5 mg Per Tube BID   nutrition supplement (JUVEN)  1 packet Per Tube BID BM   mouth rinse  15 mL Mouth Rinse Q2H   rosuvastatin  20 mg Per Tube Daily   Continuous Infusions:  sodium chloride Stopped (01/01/23 0424)   feeding supplement (JEVITY 1.2 CAL) 1,000 mL (01/05/23 0207)   levETIRAcetam 500 mg (01/05/23 1006)     LOS: 37 days    Time spent: 45 minutes spent on chart review, discussion with nursing staff, consultants, updating family and interview/physical exam; more than 50% of that time was spent in counseling and/or coordination of care.    Pamela Girt, DO [facility] Available via Epic secure chat 7am-7pm After these hours, please refer to  coverage provider listed on [email or web address] 01/05/2023, 10:48 AM

## 2023-01-05 NOTE — Progress Notes (Signed)
LTM EEG discontinued - no skin breakdown at unhook.  

## 2023-01-05 NOTE — Progress Notes (Signed)
LTM maint complete - no skin breakdown [facility] monitored, Event button test confirmed by [facility]. ? ?

## 2023-01-05 NOTE — Progress Notes (Addendum)
Subjective: Resting in bed with no evidence for pain or discomfort.   Objective: Current vital signs: BP 135/64 (BP Location: Left Arm)   Pulse (!) 119   Temp 98.6 F (37 C) (Oral)   Resp 20   Ht 5\' 1"  (1.549 m)   Wt 59 kg   SpO2 92%   BMI 24.58 kg/m  Vital signs in last 24 hours: Temp:  [98.2 F (36.8 C)-100 F (37.8 C)] 98.6 F (37 C) (01/27 0742) Pulse Rate:  [84-125] 119 (01/27 0742) Resp:  [20-41] 20 (01/27 0742) BP: (110-137)/(47-67) 135/64 (01/27 0742) SpO2:  [92 %-100 %] 92 % (01/27 0742) FiO2 (%):  [30 %] 30 % (01/27 0114)  Intake/Output from previous day: 01/26 0701 - 01/27 0700 In: 1638.2 [NG/GT:1133.9; IV Piggyback:384.2] Out: 950 [Urine:950] Intake/Output this shift: No intake/output data recorded. Nutritional status:  Diet Order             Diet NPO time specified  Diet effective midnight                   HEENT: [place]/AT. Trach collar in place Lungs: Respirations unlabored Ext: Warm and well perfused   Neurologic Exam: Mental Status: Patient remains with eyes closed and is unresponsive to voice or light touch, but did briefly open eyes to light noxious stimuli. Does not fixate or track. Nonverbal. Does not follow any commands. Does not respond to calling of her name.  Cranial Nerves:  Pupils equal, round and reactive, intact oculocephalic reflex, corneal reflexes intact. Face appears symmetrical at rest.  Motor/Sensory: Slight movement of LUE after sustained noxious stimulation.  No movement of RUE to any stimuli. At rest RUE is adducted, internally rotated and extended with increased extensory tone. Slight movement of right foot and toe to pinch of RLE. Triple flexion only of LLE to noxious.   Reflexes: 1+ brachioradialis reflexes bilaterally. 2+ patellar reflexes. Left toe briskly upgoing, left toe equivocal.  Cerebellar/Gait: Unable to assess  Lab Results: Results for orders placed or performed during the hospital encounter of 11/29/22 (from  the past 48 hour(s))  Glucose, capillary     Status: Abnormal   Collection Time: 01/03/23 11:38 AM  Result Value Ref Range   Glucose-Capillary 111 (H) 70 - 99 mg/dL    Comment: Glucose reference range applies only to samples taken after fasting for at least 8 hours.  Culture, Respiratory w Gram Stain     Status: None (Preliminary result)   Collection Time: 01/03/23  3:47 PM   Specimen: Tracheal Aspirate; Respiratory  Result Value Ref Range   Specimen Description TRACHEAL ASPIRATE    Special Requests NONE    Gram Stain      ABUNDANT SQUAMOUS EPITHELIAL CELLS PRESENT ABUNDANT WBC PRESENT, PREDOMINANTLY PMN ABUNDANT GRAM NEGATIVE RODS    Culture      ABUNDANT PSEUDOMONAS AERUGINOSA SUSCEPTIBILITIES TO FOLLOW CULTURE REINCUBATED FOR BETTER GROWTH Performed at [facility], [place] [address] [place], [place] [address]    Report Status PENDING   Glucose, capillary     Status: Abnormal   Collection Time: 01/03/23  4:07 PM  Result Value Ref Range   Glucose-Capillary 144 (H) 70 - 99 mg/dL    Comment: Glucose reference range applies only to samples taken after fasting for at least 8 hours.  Glucose, capillary     Status: Abnormal   Collection Time: 01/03/23  8:23 PM  Result Value Ref Range   Glucose-Capillary 134 (H) 70 - 99 mg/dL  Comment: Glucose reference range applies only to samples taken after fasting for at least 8 hours.  Glucose, capillary     Status: Abnormal   Collection Time: 01/04/23 12:07 AM  Result Value Ref Range   Glucose-Capillary 114 (H) 70 - 99 mg/dL    Comment: Glucose reference range applies only to samples taken after fasting for at least 8 hours.  Glucose, capillary     Status: Abnormal   Collection Time: 01/04/23  3:47 AM  Result Value Ref Range   Glucose-Capillary 126 (H) 70 - 99 mg/dL    Comment: Glucose reference range applies only to samples taken after fasting for at least 8 hours.  Glucose, capillary     Status: Abnormal   Collection Time:  01/04/23  7:17 AM  Result Value Ref Range   Glucose-Capillary 111 (H) 70 - 99 mg/dL    Comment: Glucose reference range applies only to samples taken after fasting for at least 8 hours.  Glucose, capillary     Status: Abnormal   Collection Time: 01/04/23 12:09 PM  Result Value Ref Range   Glucose-Capillary 128 (H) 70 - 99 mg/dL    Comment: Glucose reference range applies only to samples taken after fasting for at least 8 hours.  Glucose, capillary     Status: Abnormal   Collection Time: 01/04/23  3:37 PM  Result Value Ref Range   Glucose-Capillary 115 (H) 70 - 99 mg/dL    Comment: Glucose reference range applies only to samples taken after fasting for at least 8 hours.  Glucose, capillary     Status: Abnormal   Collection Time: 01/04/23  8:21 PM  Result Value Ref Range   Glucose-Capillary 129 (H) 70 - 99 mg/dL    Comment: Glucose reference range applies only to samples taken after fasting for at least 8 hours.  Glucose, capillary     Status: Abnormal   Collection Time: 01/04/23 11:02 PM  Result Value Ref Range   Glucose-Capillary 116 (H) 70 - 99 mg/dL    Comment: Glucose reference range applies only to samples taken after fasting for at least 8 hours.  Glucose, capillary     Status: Abnormal   Collection Time: 01/05/23  3:39 AM  Result Value Ref Range   Glucose-Capillary 126 (H) 70 - 99 mg/dL    Comment: Glucose reference range applies only to samples taken after fasting for at least 8 hours.   Comment 1 Notify RN    Comment 2 Document in Chart     Recent Results (from the past 240 hour(s))  Culture, Respiratory w Gram Stain     Status: None (Preliminary result)   Collection Time: 01/03/23  3:47 PM   Specimen: Tracheal Aspirate; Respiratory  Result Value Ref Range Status   Specimen Description TRACHEAL ASPIRATE  Final   Special Requests NONE  Final   Gram Stain   Final    ABUNDANT SQUAMOUS EPITHELIAL CELLS PRESENT ABUNDANT WBC PRESENT, PREDOMINANTLY PMN ABUNDANT GRAM  NEGATIVE RODS    Culture   Final    ABUNDANT PSEUDOMONAS AERUGINOSA SUSCEPTIBILITIES TO FOLLOW CULTURE REINCUBATED FOR BETTER GROWTH Performed at [facility], [address] [place], [place] [address]    Report Status PENDING  Incomplete    Lipid Panel No results for input(s): "CHOL", "TRIG", "HDL", "CHOLHDL", "VLDL", "LDLCALC" in the last 72 hours.  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  Patient has not been on any AEDs.  EEG to evaluate for seizure  Level of alertness: lethargic, asleep  AEDs during EEG study: None  Technical aspects: This EEG study was done with scalp electrodes positioned according to the 10-20 International system of electrode placement. Electrical activity was reviewed with band pass filter of 1-70Hz , sensitivity of 7 uV/mm, display speed of 40mm/sec with a 60Hz  notched filter applied as appropriate. EEG data were recorded continuously and digitally stored.  Video monitoring was available and reviewed as appropriate.  Description: No clear posterior dominant rhythm was seen. Sleep was characterized by sleep spindles (12 to 14 Hz), maximal frontocentral region. EEG showed continuous generalized and maximal left frontotemporal 3 to 6 Hz theta-delta slowing. Abundant sharp waves were noted in right frontal region, maximal F8/F4> FP2/T8, at times qasi periodic at 1hz  and rhythmic. After keppra was added, frequency of sharp waves gradually improved. Hyperventilation and photic stimulation were not  performed.    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 After keppra was added, frequency of sharp waves gradually improved. Additionally there is cortical dysfunction arising from left frontotemporal region likely secondary to underlying structural abnormality. Lastly there is moderate diffuse encephalopathy, nonspecific etiology. No seizures were seen throughout the recording.     EEG adult  Result Date: 01/03/2023 Charlsie Quest, MD     01/04/2023  8:22 AM Patient Name: NAKETA DADDARIO MRN: 01/06/2023 Epilepsy Attending: Lyla Son Referring Physician/Provider: [id number], DO Date: 01/03/2023 Duration: 42.20 mins Patient history: 82 year old patient with history of hypertension and large left hemisphere ICH who has been hospitalized since last month, who is experiencing new onset twitching of left side of face, left arm and left foot.  Patient has not been on any AEDs.  EEG to evaluate for seizure Level of alertness: lethargic AEDs during EEG study: None Technical aspects: This EEG study was done with scalp electrodes positioned according to the 10-20 International system of electrode placement. Electrical activity was reviewed with band pass filter of 1-70Hz , sensitivity of 7 uV/mm, display speed of 32mm/sec with a 60Hz  notched filter applied as appropriate. EEG data were recorded continuously and digitally stored.  Video monitoring was available and reviewed as appropriate. Description: EEG showed continuous generalized and maximal left frontotemporal 3 to 6 Hz theta-delta slowing. Abundant sharp waves were noted in right frontal region, maximal F8/F4> FP2/T8, at times qasi periodic at 1hz  and rhythmic. Hyperventilation and photic stimulation were not performed.   ABNORMALITY - Sharp  wave, right frontal - Continuous slow, generalized and maximal left frontotemporal IMPRESSION: This study showed evidence of epileptogenicity arising from right frontal region with increased risk of seizure recurrence. Additionally there is cortical dysfunction arising from left frontotemporal region likely secondary to underlying structural abnormality. Lastly there is moderate diffuse encephalopathy, nonspecific etiology. No seizures were seen throughout the recording. 76   DG CHEST PORT 1 VIEW  Result Date: 01/03/2023 CLINICAL DATA:  Fever EXAM: PORTABLE CHEST 1 VIEW  COMPARISON:  12/26/2022, 12/25/2022, 12/21/2022 FINDINGS: Tracheostomy tube remains in place. Minimal atelectasis left base. No new airspace opacity. Normal cardiac size. No pneumothorax. IMPRESSION: No active disease. Minimal atelectasis at the left base. Electronically Signed   By: Donavan Foil M.D.   On: 01/03/2023 15:50    Medications: Scheduled:  Chlorhexidine Gluconate Cloth  6 each Topical Daily   famotidine  20 mg Per Tube QHS   feeding supplement (PROSource TF20)  60 mL Per Tube Daily   latanoprost  1 drop Both Eyes QHS   metoprolol tartrate  12.5 mg Per Tube BID   nutrition supplement (JUVEN)  1 packet Per Tube BID BM   mouth rinse  15 mL Mouth Rinse Q2H   rosuvastatin  20 mg Per Tube Daily   Continuous:  sodium chloride Stopped (01/01/23 0424)   feeding supplement (JEVITY 1.2 CAL) 1,000 mL (01/05/23 0207)   levETIRAcetam Stopped (01/04/23 2135)   Assessment: 82 year old patient with history of hypertension and large left hemisphere ICH who has been hospitalized since last month. On Thursday,  she began to exhibit new onset twitching of left side of face, left arm and left foot. Patient had not been on any AEDs. She is now on Keppra.  - On today's examination, she remains obtunded. No limb twitching seen.  - CT head 12/24/21: Minimally regressed, unresolved large left hemisphere mixed density mass which  most resembles combined intracranial hemorrhage and edema. A smaller right parietal intra-axial hematoma has more substantially decreased since last month. Ongoing significant intracranial mass effect. Rightward midline shift now 15 mm (previously 17 mm). Slightly improved suprasellar cistern patency. Decreased intraventricular blood. Stable ventricle size and configuration. - EEGs: - EEG obtained Thursday reveals right frontal sharp wave and continuous generalized slowing, maximal left frontotemporal . The study shows evidence of epileptogenicity arising from right frontal region with increased risk of seizure recurrence. Additionally there is cortical dysfunction arising from left frontotemporal region likely secondary to underlying structural abnormality. Lastly there is moderate diffuse encephalopathy, nonspecific etiology. No seizures were seen throughout the recording. - LTM EEG report for Friday AM: Sharp wave, right frontal; continuous slow, generalized and maximal left frontotemporal. This study showed evidence of epileptogenicity arising from right frontal region with increased risk of seizure recurrence. Additionally there is cortical dysfunction arising from left frontotemporal region likely secondary to underlying structural abnormality. Lastly there is moderate diffuse encephalopathy, nonspecific etiology. No seizures were seen throughout the recording. - LTM EEG report for this morning: Sharp wave, right frontal; continuous slow, generalized and maximal left frontotemporal. This study showed evidence of epileptogenicity arising from right frontal region. Additionally there is cortical dysfunction arising from left frontotemporal region likely secondary to underlying structural abnormality. Lastly there is moderate diffuse encephalopathy, nonspecific etiology. No seizures were seen throughout the recording. 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 Please call if there are additional questions.      LOS: 37 days   @Electronically  signed: Dr. Kerney Elbe 01/05/2023  8:20 AM

## 2023-01-05 NOTE — Procedures (Addendum)
Patient Name: Pamela Johnston  MRN: [id number]  Epilepsy Attending: Lora Havens  Referring Physician/Provider: Lora Havens, MD  Duration: 01/04/2023 2308 to 01/05/2023 0846   Patient history: 82 year old patient with history of hypertension and large left hemisphere ICH who has been hospitalized since last month, who is experiencing new onset twitching of left side of face, left arm and left foot.  Patient has not been on any AEDs.  EEG to evaluate for seizure   Level of alertness: lethargic, asleep    AEDs during EEG study: None   Technical aspects: This EEG study was done with scalp electrodes positioned according to the 10-20 International system of electrode placement. Electrical activity was reviewed with band pass filter of 1-70Hz , sensitivity of 7 uV/mm, display speed of 63mm/sec with a 60Hz  notched filter applied as appropriate. EEG data were recorded continuously and digitally stored.  Video monitoring was available and reviewed as appropriate.   Description: No clear posterior dominant rhythm was seen. Sleep was characterized by sleep spindles (12 to 14 Hz), maximal frontocentral region. EEG showed continuous generalized and maximal left frontotemporal 3 to 6 Hz theta-delta slowing. Sharp waves were noted in right frontal region, maximal F8/F4> FP2/T8. Hyperventilation and photic stimulation were not performed.      ABNORMALITY - Sharp wave, right frontal - Continuous slow, generalized and maximal left frontotemporal    IMPRESSION: This study showed evidence of epileptogenicity arising from right frontal region. Additionally there is cortical dysfunction arising from left frontotemporal region likely secondary to underlying structural abnormality. Lastly there is moderate diffuse encephalopathy, nonspecific etiology.  No seizures were seen throughout the recording.    Pamela Johnston

## 2023-01-06 DIAGNOSIS — R0902 Hypoxemia: Secondary | ICD-10-CM | POA: Diagnosis not present

## 2023-01-06 DIAGNOSIS — J189 Pneumonia, unspecified organism: Secondary | ICD-10-CM | POA: Diagnosis not present

## 2023-01-06 DIAGNOSIS — G9349 Other encephalopathy: Secondary | ICD-10-CM

## 2023-01-06 DIAGNOSIS — T17908A Unspecified foreign body in respiratory tract, part unspecified causing other injury, initial encounter: Secondary | ICD-10-CM | POA: Diagnosis not present

## 2023-01-06 DIAGNOSIS — J9601 Acute respiratory failure with hypoxia: Secondary | ICD-10-CM | POA: Diagnosis not present

## 2023-01-06 DIAGNOSIS — I619 Nontraumatic intracerebral hemorrhage, unspecified: Secondary | ICD-10-CM | POA: Diagnosis not present

## 2023-01-06 DIAGNOSIS — L8990 Pressure ulcer of unspecified site, unspecified stage: Secondary | ICD-10-CM | POA: Insufficient documentation

## 2023-01-06 LAB — BASIC METABOLIC PANEL
Anion gap: 7 (ref 5–15)
BUN: 34 mg/dL — ABNORMAL HIGH (ref 8–23)
CO2: 26 mmol/L (ref 22–32)
Calcium: 8 mg/dL — ABNORMAL LOW (ref 8.9–10.3)
Chloride: 104 mmol/L (ref 98–111)
Creatinine, Ser: 0.53 mg/dL (ref 0.44–1.00)
GFR, Estimated: >60 mL/min (ref >60)
Glucose, Bld: 125 mg/dL — ABNORMAL HIGH (ref 70–99)
Potassium: 3.6 mmol/L (ref 3.5–5.1)
Sodium: 137 mmol/L (ref 135–145)

## 2023-01-06 LAB — CULTURE, RESPIRATORY W GRAM STAIN

## 2023-01-06 LAB — CBC
HCT: 29.3 % — ABNORMAL LOW (ref 36.0–46.0)
Hemoglobin: 9 g/dL — ABNORMAL LOW (ref 12.0–15.0)
MCH: 29.9 pg (ref 26.0–34.0)
MCHC: 30.7 g/dL (ref 30.0–36.0)
MCV: 97.3 fL (ref 80.0–100.0)
Platelets: 461 10*3/uL — ABNORMAL HIGH (ref 150–400)
RBC: 3.01 MIL/uL — ABNORMAL LOW (ref 3.87–5.11)
RDW: 13.6 % (ref 11.5–15.5)
WBC: 9 10*3/uL (ref 4.0–10.5)
nRBC: 0 % (ref 0.0–0.2)

## 2023-01-06 LAB — GLUCOSE, CAPILLARY
Glucose-Capillary: 127 mg/dL — ABNORMAL HIGH (ref 70–99)
Glucose-Capillary: 114 mg/dL — ABNORMAL HIGH (ref 70–99)
Glucose-Capillary: 145 mg/dL — ABNORMAL HIGH (ref 70–99)
Glucose-Capillary: 161 mg/dL — ABNORMAL HIGH (ref 70–99)
Glucose-Capillary: 136 mg/dL — ABNORMAL HIGH (ref 70–99)
Glucose-Capillary: 132 mg/dL — ABNORMAL HIGH (ref 70–99)

## 2023-01-06 MED ORDER — LEVETIRACETAM 500 MG PO TABS
500.0000 mg | ORAL_TABLET | Freq: Two times a day (BID) | ORAL | Status: DC
  Administered 2023-01-06 – 2023-02-27 (×104): 500 mg
  Filled 2023-01-06 (×104): qty 1

## 2023-01-06 MED ORDER — FUROSEMIDE 10 MG/ML IJ SOLN
20.0000 mg | Freq: Once | INTRAMUSCULAR | Status: AC
  Administered 2023-01-06: 20 mg via INTRAVENOUS
  Filled 2023-01-06: qty 2

## 2023-01-06 NOTE — Progress Notes (Signed)
PHARMACY - PHYSICIAN COMMUNICATION CRITICAL VALUE ALERT - BLOOD CULTURE IDENTIFICATION (BCID)  Pamela Johnston is an 82 y.o. female who presented to [facility] on 11/29/2022 with a chief complaint of catastrophic ICH with respiratory failure s/p trach.   Assessment:  Trach aspirate continues to grow pseudomonas, now resistant to meropenem. Last received meropenem 1/17 -1/21. Discussed with MD who believes likely colonization, will hold abx.   Name of physician (or Provider) Contacted: Dr. Eliseo Squires  Current antibiotics: none  Changes to prescribed antibiotics recommended:  Hold antibiotics given likely colonization  No results found for this or any previous visit.  Benetta Spar, PharmD, BCPS, BCCP Clinical Pharmacist  Please check AMION for all [place] phone numbers After 10:00 PM, call [place] [phone]

## 2023-01-06 NOTE — Progress Notes (Signed)
PROGRESS NOTE    Pamela Johnston  XVQ:[id number] DOB: 1941/12/03 DOA: 11/29/2022 PCP: [name], MD    Brief Narrative:  60-yer-old woman who presented to [facility] ED 12/21 as a Code Stroke. LKW 2000 when patient reportedly was in bed and became less responsive with +aphasia and R-sided weakness. One episode of nausea/vomiting en route with EMS. PMHx significant for HTN, chronic sacroiliitis (managed with Percocet), RLS, osteopenia.   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 Labs were grossly unremarkable with WBC mildly elevated to 12 and mild hyperglycemia to 154. Decision was made to intubate patient in ED after several episodes of emesis with concern for aspiration. HTS 3% was initiated.  12/21 - Presented to [facility] ED via EMS as Code Stroke. LKW 2000. Abrupt onset of decreased responsiveness, aphasia and R hemiplegia. Stroke admitting, NSGY consulted. PCCM consult for vent management. Treated with unasyn for aspiration pneumonia. CT Head with large L hemispheric parietal/temporal lobe IPH with extension into basal ganglia and IVH. HTS 3% started.   12/24 - Palliative care consulted, family requested meeting to be delayed 12/25 - Family requested goals of care meeting to be delayed 12/26 - Stopped Unasyn, replete phos; [name] GOC conversation: family says full code 12/27 - Restarted Unasyn for fever, RLL infiltrate; resp culture: enterobacter, MDR. Palliative consulted  12/28 - CT Head unchanged 12/29 - Unasyn changed to bactrim 12/31 - Posturing, CT head with increased shift, increased bleeding 1/2 - Family fired CVA service after discussion RE poor prognosis  1/4 - Off sedation  1/5 - No acute issues overnight. Remains on vent with increased respirations off sedation. Fever curve  slightly decreased  1/6 - Pall care meeting moved to 2pm today. [name] the son-in-law will be present. On ven 30%/ On TF. Neo not started last night. MAP > 65 after fluids bolus. Febrile +.  Last CT head 12/09/22.  1/8 - No significant neurologic/clinical change. Brief GOC discussion with husband, [name], at bedside in AM. Concerns from staff re: patient GOC/decisionmaking, want to ensure husband is included in decisions/information sharing. Attempted bedside meeting but family prematurely ended meeting due to escalated discussion. 1/9 - Febrile overnight to 101.52F, remains on bromocriptine. No significant neurologic exam changes. 1/10 Family meeting yesterday with [name], plan for trach/peg though husband says they are still discussing this morning. Per Dt. Hulen Skains " So moving forward, [name] is the designated medical decision maker for [name] " 1/11 no new issues, plan for PEG tomorrow 1/12 for trach/peg today 1/15 No change in Neuro status , trach and PEG, No weaning , will repeat CT head without, get Case management involved in looking for LTAC ( Day 3 post trach on 1/12)   1/15-CT with some stabilization 1/19 No acute events overnight, respiratory culture 1/15 with abundant Pseudomonas, abundant Staphylococcus aureus, and moderate Enterobacter remains on Meropenem  1/22 trach collar trial 12 + hrs 1/24 trach collar x 24 hours on 35% 1/25- concern for seizures-- keppra started after EEG done   Assessment and Plan: 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/mass effect Brain compression/ICH Right parietal lobe hemorrhage with associated edema  Severe encephalopathy Intraventricular hemorrhage and mass effect, vasogenic edema, subfalcine herniation -Multiple family meetings concluded over time with CMO on board, medical decision maker is [name] patient's son-in-law -  Stroke team signed off Maintain neuro protective  measures Nutrition and bowel regimen Aspiration precautions    Seizures -EEG done -started on keppra -monitor for improvement  Acute respiratory failure with hypoxia due to large intracerebral hemorrhage Pneumonia  -Previously completed 10 days of antibiotic coverage for anterobasilar pneumonia, repeat sputum culture obtained 1/15 positive for abundant Pseudomonas, abundant Staphylococcus aureus, and moderate Enterobacter, s/p course of Meropenem -Continue ATC as tolerated, hope to avoid vent needs -Wean O2 down as tolerated -Bronchial hygiene -culture still with pseudomonas-- ? Colonized now-- will hold on abx for now as no fever   Urinary retention -Was on Urecholine for 10mg  TID from 1/4 - 1/14 dose was then increased to 25mg  TID from 1/14 - 1/17 with continue retention seen  -Foley was taken out 12/26/2022 -New foley placed 1/23 Will need urologic follow up as outpatient   History of chronic sacroiliitis Osteopenia RLS -Supportive care    Pressure Injury 12/19/22 Nose Left;Mid Stage 2 -  Partial thickness loss of dermis presenting as a shallow open injury with a red, pink wound bed without slough. Stage 2 MDRI from ETT tube holder on septum area (Active)  12/19/22 2000  Location: Nose  Location Orientation: Left;Mid  Staging: Stage 2 -  Partial thickness loss of dermis presenting as a shallow open injury with a red, pink wound bed without slough.  Wound Description (Comments): Stage 2 MDRI from ETT tube holder on septum area  Present on Admission: No     Pressure Injury 12/22/22 Ear Right Stage 1 -  Intact skin with non-blanchable redness of a localized area usually over a bony prominence. (Active)  12/22/22 1816  Location: Ear  Location Orientation: Right  Staging: Stage 1 -  Intact skin with non-blanchable redness of a localized area usually over a bony prominence.  Wound Description (Comments):   Present on Admission: No     Pressure Injury 12/28/22 Ankle  Anterior;Right Deep Tissue Pressure Injury - Purple or maroon localized area of discolored intact skin or blood-filled blister due to damage of underlying soft tissue from pressure and/or shear. Pink abrasion with deep maro (Active)  12/28/22 1530  Location: Ankle  Location Orientation: Anterior;Right  Staging: Deep Tissue Pressure Injury - Purple or maroon localized area of discolored intact skin or blood-filled blister due to damage of underlying soft tissue from pressure and/or shear.  Wound Description (Comments): Pink abrasion with deep maroon center on right lateral ankle  Present on Admission: No         DVT prophylaxis: SCD's Start: 11/29/22 2215    Code Status: Full Code  Disposition Plan:  Level of care: Progressive Status is: Inpatient Remains inpatient appropriate because: needs off trach collar to go to SNF  Updated son in law and daughter 1/26  Consultants:  PCCM Palliative care neurology   Subjective: Appears swollen today  Objective: Vitals:   01/06/23 0438 01/06/23 0742 01/06/23 0857 01/06/23 1055  BP: (!) 104/45     Pulse: 95  (!) 112 95  Resp: (!) 27  (!) 35 (!) 28  Temp: 98.8 F (37.1 C) 98.4 F (36.9 C)    TempSrc: Axillary Oral    SpO2: 99%  96% 98%  Weight:      Height:        Intake/Output Summary (Last 24 hours) at 01/06/2023 1202 Last data filed at 01/06/2023 0600 Gross per 24 hour  Intake 1202 ml  Output 375 ml  Net 827 ml   1203  12/31/22 0500 01/03/23 0341 01/04/23 0500  Weight: 58.3 kg 59.7 kg 59 kg    Examination:     General: Appearance:    frail female in no acute distress     Lungs:    Trach in place, respirations unlabored  Heart:    Normal heart rate.    MS:   All extremities are intact.   Neurologic:   Eyes closed, not following commands       Data Reviewed: I have personally reviewed following labs and imaging studies  CBC: Recent Labs  Lab 01/03/23 0332 01/06/23 0405  WBC 10.2 9.0  HGB  9.6* 9.0*  HCT 29.9* 29.3*  MCV 95.8 97.3  PLT 441* 638*   Basic Metabolic Panel: Recent Labs  Lab 01/02/23 0636 01/03/23 0322 01/03/23 0332 01/06/23 0405  NA 137 137  --  137  K 3.7 3.8  --  3.6  CL 100 101  --  104  CO2 28 28  --  26  GLUCOSE 114* 162*  --  125*  BUN 26* 44*  --  34*  CREATININE 0.64 0.49  --  0.53  CALCIUM 8.2* 8.0*  --  8.0*  MG  --   --  2.3  --    GFR: Estimated Creatinine Clearance: 47.1 mL/min (by C-G formula based on SCr of 0.53 mg/dL). Liver Function Tests: No results for input(s): "AST", "ALT", "ALKPHOS", "BILITOT", "PROT", "ALBUMIN" in the last 168 hours. No results for input(s): "LIPASE", "AMYLASE" in the last 168 hours. No results for input(s): "AMMONIA" in the last 168 hours. Coagulation Profile: No results for input(s): "INR", "PROTIME" in the last 168 hours. Cardiac Enzymes: No results for input(s): "CKTOTAL", "CKMB", "CKMBINDEX", "TROPONINI" in the last 168 hours. BNP (last 3 results) No results for input(s): "PROBNP" in the last 8760 hours. HbA1C: No results for input(s): "HGBA1C" in the last 72 hours. CBG: Recent Labs  Lab 01/05/23 1559 01/05/23 2144 01/05/23 2325 01/06/23 0416 01/06/23 0741  GLUCAP 122* 122* 129* 127* 114*   Lipid Profile: No results for input(s): "CHOL", "HDL", "LDLCALC", "TRIG", "CHOLHDL", "LDLDIRECT" in the last 72 hours. Thyroid Function Tests: No results for input(s): "TSH", "T4TOTAL", "FREET4", "T3FREE", "THYROIDAB" in the last 72 hours. Anemia Panel: No results for input(s): "VITAMINB12", "FOLATE", "FERRITIN", "TIBC", "IRON", "RETICCTPCT" in the last 72 hours. Sepsis Labs: No results for input(s): "PROCALCITON", "LATICACIDVEN" in the last 168 hours.  Recent Results (from the past 240 hour(s))  Culture, Respiratory w Gram Stain     Status: None   Collection Time: 01/03/23  3:47 PM   Specimen: Tracheal Aspirate; Respiratory  Result Value Ref Range Status   Specimen Description TRACHEAL ASPIRATE   Final   Special Requests NONE  Final   Gram Stain   Final    ABUNDANT SQUAMOUS EPITHELIAL CELLS PRESENT ABUNDANT WBC PRESENT, PREDOMINANTLY PMN ABUNDANT GRAM NEGATIVE RODS    Culture   Final    ABUNDANT PSEUDOMONAS AERUGINOSA MULTI-DRUG RESISTANT ORGANISM CRITICAL RESULT CALLED TO, READ BACK BY AND VERIFIED WITH: PHARMD J MILLEN [id number] AT 1011 BY CM Performed at [facility], [place] [address] [place], [place] [address]    Report Status 01/06/2023 FINAL  Final   Organism ID, Bacteria PSEUDOMONAS AERUGINOSA  Final      Susceptibility   Pseudomonas aeruginosa - MIC*    CEFTAZIDIME 16 INTERMEDIATE Intermediate     CIPROFLOXACIN 1 INTERMEDIATE Intermediate     GENTAMICIN <=1 SENSITIVE Sensitive     IMIPENEM >=16 RESISTANT Resistant     *  ABUNDANT PSEUDOMONAS AERUGINOSA  Respiratory (~20 pathogens) panel by PCR     Status: None   Collection Time: 01/05/23  9:56 AM   Specimen: Nasopharyngeal Swab; Respiratory  Result Value Ref Range Status   Adenovirus NOT DETECTED NOT DETECTED Final   Coronavirus 229E NOT DETECTED NOT DETECTED Final    Comment: (NOTE) The Coronavirus on the Respiratory Panel, DOES NOT test for the novel  Coronavirus (2019 nCoV)    Coronavirus HKU1 NOT DETECTED NOT DETECTED Final   Coronavirus NL63 NOT DETECTED NOT DETECTED Final   Coronavirus OC43 NOT DETECTED NOT DETECTED Final   Metapneumovirus NOT DETECTED NOT DETECTED Final   Rhinovirus / Enterovirus NOT DETECTED NOT DETECTED Final   Influenza A NOT DETECTED NOT DETECTED Final   Influenza B NOT DETECTED NOT DETECTED Final   Parainfluenza Virus 1 NOT DETECTED NOT DETECTED Final   Parainfluenza Virus 2 NOT DETECTED NOT DETECTED Final   Parainfluenza Virus 3 NOT DETECTED NOT DETECTED Final   Parainfluenza Virus 4 NOT DETECTED NOT DETECTED Final   Respiratory Syncytial Virus NOT DETECTED NOT DETECTED Final   Bordetella pertussis NOT DETECTED NOT DETECTED Final   Bordetella Parapertussis NOT DETECTED NOT  DETECTED Final   Chlamydophila pneumoniae NOT DETECTED NOT DETECTED Final   Mycoplasma pneumoniae NOT DETECTED NOT DETECTED Final    Comment: Performed at [facility], [address], [place], [place] [address]         Radiology Studies: No results found.      Scheduled Meds:  Chlorhexidine Gluconate Cloth  6 each Topical Daily   famotidine  20 mg Per Tube QHS   feeding supplement (PROSource TF20)  60 mL Per Tube Daily   furosemide  20 mg Intravenous Once   Gerhardt's butt cream   Topical QID   hydrocortisone  25 mg Rectal BID   latanoprost  1 drop Both Eyes QHS   metoprolol tartrate  12.5 mg Per Tube BID   nutrition supplement (JUVEN)  1 packet Per Tube BID BM   mouth rinse  15 mL Mouth Rinse Q2H   rosuvastatin  20 mg Per Tube Daily   Continuous Infusions:  sodium chloride Stopped (01/01/23 0424)   feeding supplement (JEVITY 1.2 CAL) 1,000 mL (01/06/23 0002)   levETIRAcetam 500 mg (01/06/23 1004)     LOS: 38 days    Time spent: 45 minutes spent on chart review, discussion with nursing staff, consultants, updating family and interview/physical exam; more than 50% of that time was spent in counseling and/or coordination of care.    Joseph Art, DO [facility] Available via Epic secure chat 7am-7pm After these hours, please refer to coverage provider listed on [email or web address] 01/06/2023, 12:02 PM

## 2023-01-07 DIAGNOSIS — I619 Nontraumatic intracerebral hemorrhage, unspecified: Secondary | ICD-10-CM | POA: Diagnosis not present

## 2023-01-07 DIAGNOSIS — G9349 Other encephalopathy: Secondary | ICD-10-CM | POA: Diagnosis not present

## 2023-01-07 DIAGNOSIS — J9601 Acute respiratory failure with hypoxia: Secondary | ICD-10-CM | POA: Diagnosis not present

## 2023-01-07 DIAGNOSIS — T17908A Unspecified foreign body in respiratory tract, part unspecified causing other injury, initial encounter: Secondary | ICD-10-CM | POA: Diagnosis not present

## 2023-01-07 DIAGNOSIS — R0902 Hypoxemia: Secondary | ICD-10-CM | POA: Diagnosis not present

## 2023-01-07 DIAGNOSIS — J189 Pneumonia, unspecified organism: Secondary | ICD-10-CM | POA: Diagnosis not present

## 2023-01-07 DIAGNOSIS — Z930 Tracheostomy status: Secondary | ICD-10-CM

## 2023-01-07 LAB — GLUCOSE, CAPILLARY
Glucose-Capillary: 113 mg/dL — ABNORMAL HIGH (ref 70–99)
Glucose-Capillary: 122 mg/dL — ABNORMAL HIGH (ref 70–99)
Glucose-Capillary: 133 mg/dL — ABNORMAL HIGH (ref 70–99)
Glucose-Capillary: 134 mg/dL — ABNORMAL HIGH (ref 70–99)
Glucose-Capillary: 123 mg/dL — ABNORMAL HIGH (ref 70–99)

## 2023-01-07 NOTE — Progress Notes (Signed)
NAME:  Pamela Johnston, MRN:  [id number], DOB:  11-16-1941, LOS: 68 ADMISSION DATE:  11/29/2022, CONSULTATION DATE:  11/29/2022 REFERRING MD:  Leonel Ramsay - Neuro REASON FOR CONSULT: Ventilator management in setting of large IPH  History of Present Illness:  81-yer-old woman who presented to [facility] ED 12/21 as a Code Stroke. LKW 2000 when patient reportedly was in bed and became less responsive with +aphasia and R-sided weakness. One episode of nausea/vomiting en route with EMS. PMHx significant for HTN, chronic sacroiliitis (managed with Percocet), RLS, osteopenia.   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 Labs were grossly unremarkable with WBC mildly elevated to 12 and mild hyperglycemia to 154. Decision was made to intubate patient in ED after several episodes of emesis with concern for aspiration. HTS 3% was initiated.   Stroke service to admit. NSGY consulted with no plan for intervention at this time.   PCCM consulted for ventilator/hemodynamic management.   Pertinent Medical History:  Hypertension Sacroilititis Restless legs syndrome Osteopenia  Significant Hospital Events: Including procedures, antibiotic start and stop dates in addition to other pertinent events   12/21 - Presented to [facility] ED via EMS as Code Stroke. LKW 2000. Abrupt onset of decreased responsiveness, aphasia and R hemiplegia. Stroke admitting, NSGY consulted. PCCM consult for vent management. Treated with unasyn for aspiration pneumonia. CT Head with large L hemispheric parietal/temporal lobe IPH with extension into basal ganglia and IVH. HTS 3% started.   12/24 - Palliative care consulted, family requested meeting to be delayed 12/25 - Family requested goals of care meeting to be delayed 12/26 - Stopped  Unasyn, replete phos; [name] GOC conversation: family says full code 12/27 - Restarted Unasyn for fever, RLL infiltrate; resp culture: enterobacter, MDR. Palliative consulted  12/28 - CT Head unchanged 12/29 - Unasyn changed to bactrim 12/31 - Posturing, CT head with increased shift, increased bleeding 1/2 - Family fired CVA service after discussion RE poor prognosis  1/4 - Off sedation  1/5 - No acute issues overnight. Remains on vent with increased respirations off sedation. Fever curve slightly decreased  1/6 - Pall care meeting moved to 2pm today. [name] the son-in-law will be present. On ven 30%/ On TF. Neo not started last night. MAP > 65 after fluids bolus. Febrile +.  Last CT head 12/09/22.  1/8 - No significant neurologic/clinical change. Brief GOC discussion with husband, [name], at bedside in AM. Concerns from staff re: patient GOC/decisionmaking, want to ensure husband is included in decisions/information sharing. Attempted bedside meeting but family prematurely ended meeting due to escalated discussion. 1/9 - Febrile overnight to 101.33F, remains on bromocriptine. No significant neurologic exam changes. 1/10 Family meeting yesterday with [name], plan for trach/peg though husband says they are still discussing this morning. Per Dt. Hulen Skains " So moving forward, [name] is the designated medical decision maker for Danne Scardina " 1/11 no new issues, plan for PEG tomorrow 1/12 for trach/peg today 1/15 No change in Neuro status , trach and PEG, No weaning , will repeat CT head without, get Case management involved in looking for LTAC ( Day 3 post trach on 1/12)   1/15-CT with some stabilization 1/19 No acute events overnight, respiratory culture 1/15 with abundant Pseudomonas, abundant Staphylococcus aureus, and moderate Enterobacter remains on Meropenem  1/22 trach collar trial 12 +  hrs 1/24 trach collar x 24 hours on 35% 1/29 order placed to change trach to cuffless   Interim  History / Subjective:   Appears comfortable  Objective:   Blood pressure (!) 123/57, pulse (!) 115, temperature 98.8 F (37.1 C), temperature source Oral, resp. rate (!) 30, height 5\' 1"  (1.549 m), weight 58.3 kg, SpO2 98 %.    FiO2 (%):  [35 %] 35 %   Intake/Output Summary (Last 24 hours) at 01/02/2023 0845 Last data filed at 01/02/2023 0600 Gross per 24 hour  Intake 1210 ml  Output 2145 ml  Net -935 ml    Filed Weights   12/29/22 0353 12/30/22 0449 12/31/22 0500  Weight: 60 kg 60.4 kg 58.3 kg   Physical Exam: General comatose 82 year old female who remains tracheostomy dependent HEENT normocephalic atraumatic tracheostomy is midline still has some minimal yellow-tinged secretions Pulmonary: Scattered rhonchi Cardiac: Regular rate and rhythm Abdomen: Soft nontender no organomegaly Extremities: Warm dry brisk cap refill. 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  She does not need a cuffed tracheostomy.  Plan Continue routine trach care Will place order to  change to 6 cuffless trach today We will continue to see weekly while she is in the hospital  Best Practice (right click and "Reselect all SmartList Selections" daily)  Per primary   Critical care: N/A

## 2023-01-07 NOTE — Procedures (Signed)
Tracheostomy Change Note  Patient Details:   Name: Pamela Johnston DOB: 02-Jul-1941 MRN: [id number]    Airway Documentation:     Evaluation  O2 sats: stable throughout Complications: No apparent complications Patient did tolerate procedure well. Bilateral Breath Sounds: Clear, Diminished   RT X 2 changed patient's trach to new #6 Shiley cuffless with no complications. Positive color change noted on ETCO2 and BBS heard. VSS. RT will continue to monitor.   [name] 01/07/2023, 4:47 PM

## 2023-01-07 NOTE — Progress Notes (Signed)
PROGRESS NOTE    Pamela Johnston  XVQ:[id number] DOB: 1941/12/03 DOA: 11/29/2022 PCP: [name], MD    Brief Narrative:  60-yer-old woman who presented to [facility] ED 12/21 as a Code Stroke. LKW 2000 when patient reportedly was in bed and became less responsive with +aphasia and R-sided weakness. One episode of nausea/vomiting en route with EMS. PMHx significant for HTN, chronic sacroiliitis (managed with Percocet), RLS, osteopenia.   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 Labs were grossly unremarkable with WBC mildly elevated to 12 and mild hyperglycemia to 154. Decision was made to intubate patient in ED after several episodes of emesis with concern for aspiration. HTS 3% was initiated.  12/21 - Presented to [facility] ED via EMS as Code Stroke. LKW 2000. Abrupt onset of decreased responsiveness, aphasia and R hemiplegia. Stroke admitting, NSGY consulted. PCCM consult for vent management. Treated with unasyn for aspiration pneumonia. CT Head with large L hemispheric parietal/temporal lobe IPH with extension into basal ganglia and IVH. HTS 3% started.   12/24 - Palliative care consulted, family requested meeting to be delayed 12/25 - Family requested goals of care meeting to be delayed 12/26 - Stopped Unasyn, replete phos; [name] GOC conversation: family says full code 12/27 - Restarted Unasyn for fever, RLL infiltrate; resp culture: enterobacter, MDR. Palliative consulted  12/28 - CT Head unchanged 12/29 - Unasyn changed to bactrim 12/31 - Posturing, CT head with increased shift, increased bleeding 1/2 - Family fired CVA service after discussion RE poor prognosis  1/4 - Off sedation  1/5 - No acute issues overnight. Remains on vent with increased respirations off sedation. Fever curve  slightly decreased  1/6 - Pall care meeting moved to 2pm today. [name] the son-in-law will be present. On ven 30%/ On TF. Neo not started last night. MAP > 65 after fluids bolus. Febrile +.  Last CT head 12/09/22.  1/8 - No significant neurologic/clinical change. Brief GOC discussion with husband, Pamela Johnston, at bedside in AM. Concerns from staff re: patient GOC/decisionmaking, want to ensure husband is included in decisions/information sharing. Attempted bedside meeting but family prematurely ended meeting due to escalated discussion. 1/9 - Febrile overnight to 101.52F, remains on bromocriptine. No significant neurologic exam changes. 1/10 Family meeting yesterday with [name], plan for trach/peg though husband says they are still discussing this morning. Per Dt. Hulen Skains " So moving forward, Pamela Johnston is the designated medical decision maker for Pamela Johnston " 1/11 no new issues, plan for PEG tomorrow 1/12 for trach/peg today 1/15 No change in Neuro status , trach and PEG, No weaning , will repeat CT head without, get Case management involved in looking for LTAC ( Day 3 post trach on 1/12)   1/15-CT with some stabilization 1/19 No acute events overnight, respiratory culture 1/15 with abundant Pseudomonas, abundant Staphylococcus aureus, and moderate Enterobacter remains on Meropenem  1/22 trach collar trial 12 + hrs 1/24 trach collar x 24 hours on 35% 1/25- concern for seizures-- keppra started after EEG done   Assessment and Plan: 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/mass effect Brain compression/ICH Right parietal lobe hemorrhage with associated edema  Severe encephalopathy Intraventricular hemorrhage and mass effect, vasogenic edema, subfalcine herniation -Multiple family meetings concluded over time with CMO on board, medical decision maker is Pamela Johnston patient's son-in-law -  Stroke team signed off Maintain neuro protective  measures Nutrition and bowel regimen Aspiration precautions    Seizures -EEG done -started on keppra -monitor for improvement  Acute respiratory failure with hypoxia due to large intracerebral hemorrhage Pneumonia  -Previously completed 10 days of antibiotic coverage for anterobasilar pneumonia, repeat sputum culture obtained 1/15 positive for abundant Pseudomonas, abundant Staphylococcus aureus, and moderate Enterobacter, s/p course of Meropenem -Continue ATC as tolerated, hope to avoid vent needs -Wean O2 down as tolerated -Bronchial hygiene -culture still with pseudomonas-- ? Colonized now-- will hold on abx for now as no fever   Urinary retention -Was on Urecholine for 10mg  TID from 1/4 - 1/14 dose was then increased to 25mg  TID from 1/14 - 1/17 with continue retention seen  -Foley was taken out 12/26/2022 -New foley placed 1/23 Will need urologic follow up as outpatient   History of chronic sacroiliitis Osteopenia RLS -Supportive care    Pressure Injury 12/19/22 Nose Left;Mid Stage 2 -  Partial thickness loss of dermis presenting as a shallow open injury with a red, pink wound bed without slough. Stage 2 MDRI from ETT tube holder on septum area (Active)  12/19/22 2000  Location: Nose  Location Orientation: Left;Mid  Staging: Stage 2 -  Partial thickness loss of dermis presenting as a shallow open injury with a red, pink wound bed without slough.  Wound Description (Comments): Stage 2 MDRI from ETT tube holder on septum area  Present on Admission: No     Pressure Injury 12/22/22 Ear Right Stage 1 -  Intact skin with non-blanchable redness of a localized area usually over a bony prominence. (Active)  12/22/22 1816  Location: Ear  Location Orientation: Right  Staging: Stage 1 -  Intact skin with non-blanchable redness of a localized area usually over a bony prominence.  Wound Description (Comments):   Present on Admission: No     Pressure Injury 12/28/22 Ankle  Anterior;Right Deep Tissue Pressure Injury - Purple or maroon localized area of discolored intact skin or blood-filled blister due to damage of underlying soft tissue from pressure and/or shear. Pink abrasion with deep maro (Active)  12/28/22 1530  Location: Ankle  Location Orientation: Anterior;Right  Staging: Deep Tissue Pressure Injury - Purple or maroon localized area of discolored intact skin or blood-filled blister due to damage of underlying soft tissue from pressure and/or shear.  Wound Description (Comments): Pink abrasion with deep maroon center on right lateral ankle  Present on Admission: No         DVT prophylaxis: SCD's Start: 11/29/22 2215    Code Status: Full Code  Disposition Plan:  Level of care: Progressive Status is: Inpatient Remains inpatient appropriate because: needs off trach collar to go to SNF  Updated son in law and daughter 1/28, husband at bedside  Consultants:  PCCM Palliative care neurology   Subjective: No overnight events  Objective: Vitals:   01/07/23 0324 01/07/23 0750 01/07/23 0845 01/07/23 1131  BP: (!) 99/56 (!) 105/51 (!) 105/51 (!) 121/59  Pulse: 100 (!) 106 (!) 108 93  Resp: 20 (!) 30 (!) 32 (!) 22  Temp: 98.6 F (37 C) 97.7 F (36.5 C)  98.7 F (37.1 C)  TempSrc: Axillary Oral  Oral  SpO2: 95% 97% 97% 96%  Weight:      Height:        Intake/Output Summary (Last 24 hours) at 01/07/2023 1134 Last data filed at 01/06/2023 2321 Gross per 24 hour  Intake 632 ml  Output 1700 ml  Net -1068 ml   Filed Weights   12/31/22 0500 01/03/23 0341 01/04/23 0500  Weight: 58.3 kg 59.7 kg 59 kg    Examination:     General: Appearance:    frail female in no acute distress     Lungs:    Trach in place, respirations unlabored  Heart:    Normal heart rate.    MS:   All extremities are intact.   Neurologic:   Eyes closed, not following commands       Data Reviewed: I have personally reviewed following labs and imaging  studies  CBC: Recent Labs  Lab 01/03/23 0332 01/06/23 0405  WBC 10.2 9.0  HGB 9.6* 9.0*  HCT 29.9* 29.3*  MCV 95.8 97.3  PLT 441* 461*   Basic Metabolic Panel: Recent Labs  Lab 01/02/23 0636 01/03/23 0322 01/03/23 0332 01/06/23 0405  NA 137 137  --  137  K 3.7 3.8  --  3.6  CL 100 101  --  104  CO2 28 28  --  26  GLUCOSE 114* 162*  --  125*  BUN 26* 44*  --  34*  CREATININE 0.64 0.49  --  0.53  CALCIUM 8.2* 8.0*  --  8.0*  MG  --   --  2.3  --    GFR: Estimated Creatinine Clearance: 47.1 mL/min (by C-G formula based on SCr of 0.53 mg/dL). Liver Function Tests: No results for input(s): "AST", "ALT", "ALKPHOS", "BILITOT", "PROT", "ALBUMIN" in the last 168 hours. No results for input(s): "LIPASE", "AMYLASE" in the last 168 hours. No results for input(s): "AMMONIA" in the last 168 hours. Coagulation Profile: No results for input(s): "INR", "PROTIME" in the last 168 hours. Cardiac Enzymes: No results for input(s): "CKTOTAL", "CKMB", "CKMBINDEX", "TROPONINI" in the last 168 hours. BNP (last 3 results) No results for input(s): "PROBNP" in the last 8760 hours. HbA1C: No results for input(s): "HGBA1C" in the last 72 hours. CBG: Recent Labs  Lab 01/06/23 2000 01/06/23 2322 01/07/23 0326 01/07/23 0754 01/07/23 1129  GLUCAP 136* 132* 113* 122* 133*   Lipid Profile: No results for input(s): "CHOL", "HDL", "LDLCALC", "TRIG", "CHOLHDL", "LDLDIRECT" in the last 72 hours. Thyroid Function Tests: No results for input(s): "TSH", "T4TOTAL", "FREET4", "T3FREE", "THYROIDAB" in the last 72 hours. Anemia Panel: No results for input(s): "VITAMINB12", "FOLATE", "FERRITIN", "TIBC", "IRON", "RETICCTPCT" in the last 72 hours. Sepsis Labs: No results for input(s): "PROCALCITON", "LATICACIDVEN" in the last 168 hours.  Recent Results (from the past 240 hour(s))  Culture, Respiratory w Gram Stain     Status: None   Collection Time: 01/03/23  3:47 PM   Specimen: Tracheal Aspirate;  Respiratory  Result Value Ref Range Status   Specimen Description TRACHEAL ASPIRATE  Final   Special Requests NONE  Final   Gram Stain   Final    ABUNDANT SQUAMOUS EPITHELIAL CELLS PRESENT ABUNDANT WBC PRESENT, PREDOMINANTLY PMN ABUNDANT GRAM NEGATIVE RODS    Culture   Final    ABUNDANT PSEUDOMONAS AERUGINOSA MULTI-DRUG RESISTANT ORGANISM CRITICAL RESULT CALLED TO, READ BACK BY AND VERIFIED WITH: PHARMD J MILLEN [id number] AT 1011 BY CM Performed at [facility], [address] [place], [place] [address]    Report Status 01/06/2023 FINAL  Final   Organism ID, Bacteria PSEUDOMONAS AERUGINOSA  Final      Susceptibility   Pseudomonas aeruginosa - MIC*    CEFTAZIDIME 16 INTERMEDIATE Intermediate     CIPROFLOXACIN 1 INTERMEDIATE Intermediate     GENTAMICIN <=1 SENSITIVE  Sensitive     IMIPENEM >=16 RESISTANT Resistant     * ABUNDANT PSEUDOMONAS AERUGINOSA  Respiratory (~20 pathogens) panel by PCR     Status: None   Collection Time: 01/05/23  9:56 AM   Specimen: Nasopharyngeal Swab; Respiratory  Result Value Ref Range Status   Adenovirus NOT DETECTED NOT DETECTED Final   Coronavirus 229E NOT DETECTED NOT DETECTED Final    Comment: (NOTE) The Coronavirus on the Respiratory Panel, DOES NOT test for the novel  Coronavirus (2019 nCoV)    Coronavirus HKU1 NOT DETECTED NOT DETECTED Final   Coronavirus NL63 NOT DETECTED NOT DETECTED Final   Coronavirus OC43 NOT DETECTED NOT DETECTED Final   Metapneumovirus NOT DETECTED NOT DETECTED Final   Rhinovirus / Enterovirus NOT DETECTED NOT DETECTED Final   Influenza A NOT DETECTED NOT DETECTED Final   Influenza B NOT DETECTED NOT DETECTED Final   Parainfluenza Virus 1 NOT DETECTED NOT DETECTED Final   Parainfluenza Virus 2 NOT DETECTED NOT DETECTED Final   Parainfluenza Virus 3 NOT DETECTED NOT DETECTED Final   Parainfluenza Virus 4 NOT DETECTED NOT DETECTED Final   Respiratory Syncytial Virus NOT DETECTED NOT DETECTED Final   Bordetella  pertussis NOT DETECTED NOT DETECTED Final   Bordetella Parapertussis NOT DETECTED NOT DETECTED Final   Chlamydophila pneumoniae NOT DETECTED NOT DETECTED Final   Mycoplasma pneumoniae NOT DETECTED NOT DETECTED Final    Comment: Performed at [facility], [place]. [address] [place], [place] [address]         Radiology Studies: No results found.      Scheduled Meds:  Chlorhexidine Gluconate Cloth  6 each Topical Daily   famotidine  20 mg Per Tube QHS   feeding supplement (PROSource TF20)  60 mL Per Tube Daily   Gerhardt's butt cream   Topical QID   hydrocortisone  25 mg Rectal BID   latanoprost  1 drop Both Eyes QHS   levETIRAcetam  500 mg Per Tube BID   metoprolol tartrate  12.5 mg Per Tube BID   nutrition supplement (JUVEN)  1 packet Per Tube BID BM   mouth rinse  15 mL Mouth Rinse Q2H   rosuvastatin  20 mg Per Tube Daily   Continuous Infusions:  sodium chloride Stopped (01/01/23 0424)   feeding supplement (JEVITY 1.2 CAL) 1,000 mL (01/06/23 1841)     LOS: 39 days    Time spent: 45 minutes spent on chart review, discussion with nursing staff, consultants, updating family and interview/physical exam; more than 50% of that time was spent in counseling and/or coordination of care.    Geradine Girt, DO [facility] Available via Epic secure chat 7am-7pm After these hours, please refer to coverage provider listed on [email or web address] 01/07/2023, 11:34 AM

## 2023-01-08 DIAGNOSIS — I619 Nontraumatic intracerebral hemorrhage, unspecified: Secondary | ICD-10-CM | POA: Diagnosis not present

## 2023-01-08 LAB — GLUCOSE, CAPILLARY
Glucose-Capillary: 109 mg/dL — ABNORMAL HIGH (ref 70–99)
Glucose-Capillary: 111 mg/dL — ABNORMAL HIGH (ref 70–99)
Glucose-Capillary: 127 mg/dL — ABNORMAL HIGH (ref 70–99)
Glucose-Capillary: 127 mg/dL — ABNORMAL HIGH (ref 70–99)
Glucose-Capillary: 127 mg/dL — ABNORMAL HIGH (ref 70–99)
Glucose-Capillary: 129 mg/dL — ABNORMAL HIGH (ref 70–99)
Glucose-Capillary: 139 mg/dL — ABNORMAL HIGH (ref 70–99)

## 2023-01-08 NOTE — Progress Notes (Signed)
PROGRESS NOTE    Pamela Johnston Johnston  XVQ:[id number] DOB: 1941/12/03 DOA: 11/29/2022 PCP: Pamela Johnston Aly, MD    Brief Narrative:  60-yer-old woman who presented to [facility] ED 12/21 as a Code Stroke. LKW 2000 when patient reportedly was in bed and became less responsive with +aphasia and R-sided weakness. One episode of nausea/vomiting en route with EMS. PMHx significant for HTN, chronic sacroiliitis (managed with Percocet), RLS, osteopenia.   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 Labs were grossly unremarkable with WBC mildly elevated to 12 and mild hyperglycemia to 154. Decision was made to intubate patient in ED after several episodes of emesis with concern for aspiration. HTS 3% was initiated.  12/21 - Presented to [facility] ED via EMS as Code Stroke. LKW 2000. Abrupt onset of decreased responsiveness, aphasia and R hemiplegia. Stroke admitting, NSGY consulted. PCCM consult for vent management. Treated with unasyn for aspiration pneumonia. CT Head with large L hemispheric parietal/temporal lobe IPH with extension into basal ganglia and IVH. HTS 3% started.   12/24 - Palliative care consulted, family requested meeting to be delayed 12/25 - Family requested goals of care meeting to be delayed 12/26 - Stopped Unasyn, replete phos; Pamela Johnston Johnston GOC conversation: family says full code 12/27 - Restarted Unasyn for fever, RLL infiltrate; resp culture: enterobacter, MDR. Palliative consulted  12/28 - CT Head unchanged 12/29 - Unasyn changed to bactrim 12/31 - Posturing, CT head with increased shift, increased bleeding 1/2 - Family fired CVA service after discussion RE poor prognosis  1/4 - Off sedation  1/5 - No acute issues overnight. Remains on vent with increased respirations off sedation. Fever curve  slightly decreased  1/6 - Pall care meeting moved to 2pm today. Pamela Johnston Johnston the son-in-law will be present. On ven 30%/ On TF. Neo not started last night. MAP > 65 after fluids bolus. Febrile +.  Last CT head 12/09/22.  1/8 - No significant neurologic/clinical change. Brief GOC discussion with husband, Pamela Johnston Johnston, at bedside in AM. Concerns from staff re: patient GOC/decisionmaking, want to ensure husband is included in decisions/information sharing. Attempted bedside meeting but family prematurely ended meeting due to escalated discussion. 1/9 - Febrile overnight to 101.52F, remains on bromocriptine. No significant neurologic exam changes. 1/10 Family meeting yesterday with Dr. Hulen Johnston, plan for trach/peg though husband says they are still discussing this morning. Per Dt. Pamela Johnston Johnston " So moving forward, Pamela Johnston Johnston is the designated medical decision maker for Pamela Johnston Johnston " 1/11 no new issues, plan for PEG tomorrow 1/12 for trach/peg today 1/15 No change in Neuro status , trach and PEG, No weaning , will repeat CT head without, get Case management involved in looking for LTAC ( Day 3 post trach on 1/12)   1/15-CT with some stabilization 1/19 No acute events overnight, respiratory culture 1/15 with abundant Pseudomonas, abundant Staphylococcus aureus, and moderate Enterobacter remains on Meropenem  1/22 trach collar trial 12 + hrs 1/24 trach collar x 24 hours on 35% 1/25- concern for seizures-- keppra started after EEG done   Assessment and Plan: 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/mass effect Brain compression/ICH Right parietal lobe hemorrhage with associated edema  Severe encephalopathy Intraventricular hemorrhage and mass effect, vasogenic edema, subfalcine herniation -Multiple family meetings concluded over time with CMO on board, medical decision maker is Pamela Johnston Johnston patient's son-in-law -  Stroke team signed off Maintain neuro protective  measures Nutrition and bowel regimen Aspiration precautions    Seizures -EEG done -started on keppra -monitor for improvement  Acute respiratory failure with hypoxia due to large intracerebral hemorrhage Pneumonia  -Previously completed 10 days of antibiotic coverage for anterobasilar pneumonia, repeat sputum culture obtained 1/15 positive for abundant Pseudomonas, abundant Staphylococcus aureus, and moderate Enterobacter, s/p course of Meropenem -Continue ATC as tolerated, hope to avoid vent needs -Wean O2 down as tolerated -Bronchial hygiene -culture still with pseudomonas-- ? Colonized now-- will hold on abx for now as no fever   Urinary retention -Was on Urecholine for 10mg  TID from 1/4 - 1/14 dose was then increased to 25mg  TID from 1/14 - 1/17 with continue retention seen  -Foley was taken out 12/26/2022 -New foley placed 1/23 Will need urologic follow up as outpatient   History of chronic sacroiliitis Osteopenia RLS -Supportive care    Pressure Injury 12/19/22 Nose Left;Mid Stage 2 -  Partial thickness loss of dermis presenting as a shallow open injury with a red, pink wound bed without slough. Stage 2 MDRI from ETT tube holder on septum area (Active)  12/19/22 2000  Location: Nose  Location Orientation: Left;Mid  Staging: Stage 2 -  Partial thickness loss of dermis presenting as a shallow open injury with a red, pink wound bed without slough.  Wound Description (Comments): Stage 2 MDRI from ETT tube holder on septum area  Present on Admission: No     Pressure Injury 12/22/22 Ear Right Stage 1 -  Intact skin with non-blanchable redness of a localized area usually over a bony prominence. (Active)  12/22/22 1816  Location: Ear  Location Orientation: Right  Staging: Stage 1 -  Intact skin with non-blanchable redness of a localized area usually over a bony prominence.  Wound Description (Comments):   Present on Admission: No     Pressure Injury 12/28/22 Ankle  Anterior;Right Deep Tissue Pressure Injury - Purple or maroon localized area of discolored intact skin or blood-filled blister due to damage of underlying soft tissue from pressure and/or shear. Pink abrasion with deep maro (Active)  12/28/22 1530  Location: Ankle  Location Orientation: Anterior;Right  Staging: Deep Tissue Pressure Injury - Purple or maroon localized area of discolored intact skin or blood-filled blister due to damage of underlying soft tissue from pressure and/or shear.  Wound Description (Comments): Pink abrasion with deep maroon center on right lateral ankle  Present on Admission: No         DVT prophylaxis: SCD's Start: 11/29/22 2215    Code Status: Full Code  Disposition Plan:  Level of care: Progressive Status is: Inpatient Remains inpatient appropriate because: needs off trach collar to go to SNF  Updated son in law and daughter 1/28, husband at bedside 1/30  Consultants:  PCCM Palliative care neurology   Subjective: Remains stable-- no overnight events  Objective: Vitals:   01/08/23 0335 01/08/23 0447 01/08/23 0717 01/08/23 0803  BP:   (!) 108/52 (!) 108/52  Pulse:   (!) 111 (!) 117  Resp:   20 (!) 33  Temp:   98.5 F (36.9 C)   TempSrc:   Oral   SpO2: 99%  96% 94%  Weight:  59.7 kg    Height:        Intake/Output Summary (Last 24 hours) at 01/08/2023 1047 Last data filed at 01/08/2023 0428 Gross per 24 hour  Intake 1363.17 ml  Output 1650 ml  Net -286.83 ml  Filed Weights   01/03/23 0341 01/04/23 0500 01/08/23 0447  Weight: 59.7 kg 59 kg 59.7 kg    Examination:     General: Appearance:    elderly female in no acute distress     Lungs:     Clear to auscultation bilaterally, respirations unlabored  Heart:    Tachycardic.   MS:   All extremities are intact.   Neurologic:   Eyes closed, not following commands       Data Reviewed: I have personally reviewed following labs and imaging studies  CBC: Recent Labs  Lab  01/03/23 0332 01/06/23 0405  WBC 10.2 9.0  HGB 9.6* 9.0*  HCT 29.9* 29.3*  MCV 95.8 97.3  PLT 441* 250*   Basic Metabolic Panel: Recent Labs  Lab 01/02/23 0636 01/03/23 0322 01/03/23 0332 01/06/23 0405  NA 137 137  --  137  K 3.7 3.8  --  3.6  CL 100 101  --  104  CO2 28 28  --  26  GLUCOSE 114* 162*  --  125*  BUN 26* 44*  --  34*  CREATININE 0.64 0.49  --  0.53  CALCIUM 8.2* 8.0*  --  8.0*  MG  --   --  2.3  --    GFR: Estimated Creatinine Clearance: 47.3 mL/min (by C-G formula based on SCr of 0.53 mg/dL). Liver Function Tests: No results for input(s): "AST", "ALT", "ALKPHOS", "BILITOT", "PROT", "ALBUMIN" in the last 168 hours. No results for input(s): "LIPASE", "AMYLASE" in the last 168 hours. No results for input(s): "AMMONIA" in the last 168 hours. Coagulation Profile: No results for input(s): "INR", "PROTIME" in the last 168 hours. Cardiac Enzymes: No results for input(s): "CKTOTAL", "CKMB", "CKMBINDEX", "TROPONINI" in the last 168 hours. BNP (last 3 results) No results for input(s): "PROBNP" in the last 8760 hours. HbA1C: No results for input(s): "HGBA1C" in the last 72 hours. CBG: Recent Labs  Lab 01/07/23 1630 01/07/23 2000 01/08/23 0002 01/08/23 0412 01/08/23 0801  GLUCAP 134* 123* 127* 109* 111*   Lipid Profile: No results for input(s): "CHOL", "HDL", "LDLCALC", "TRIG", "CHOLHDL", "LDLDIRECT" in the last 72 hours. Thyroid Function Tests: No results for input(s): "TSH", "T4TOTAL", "FREET4", "T3FREE", "THYROIDAB" in the last 72 hours. Anemia Panel: No results for input(s): "VITAMINB12", "FOLATE", "FERRITIN", "TIBC", "IRON", "RETICCTPCT" in the last 72 hours. Sepsis Labs: No results for input(s): "PROCALCITON", "LATICACIDVEN" in the last 168 hours.  Recent Results (from the past 240 hour(s))  Culture, Respiratory w Gram Stain     Status: None   Collection Time: 01/03/23  3:47 PM   Specimen: Tracheal Aspirate; Respiratory  Result Value Ref Range  Status   Specimen Description TRACHEAL ASPIRATE  Final   Special Requests NONE  Final   Gram Stain   Final    ABUNDANT SQUAMOUS EPITHELIAL CELLS PRESENT ABUNDANT WBC PRESENT, PREDOMINANTLY PMN ABUNDANT GRAM NEGATIVE RODS    Culture   Final    ABUNDANT PSEUDOMONAS AERUGINOSA MULTI-DRUG RESISTANT ORGANISM CRITICAL RESULT CALLED TO, READ BACK BY AND VERIFIED WITH: PHARMD J MILLEN [id number] AT 1011 BY CM Performed at [facility], [place] [address], [place], [place] [address]    Report Status 01/06/2023 FINAL  Final   Organism ID, Bacteria PSEUDOMONAS AERUGINOSA  Final      Susceptibility   Pseudomonas aeruginosa - MIC*    CEFTAZIDIME 16 INTERMEDIATE Intermediate     CIPROFLOXACIN 1 INTERMEDIATE Intermediate     GENTAMICIN <=1 SENSITIVE Sensitive     IMIPENEM >=  16 RESISTANT Resistant     * ABUNDANT PSEUDOMONAS AERUGINOSA  Respiratory (~20 pathogens) panel by PCR     Status: None   Collection Time: 01/05/23  9:56 AM   Specimen: Nasopharyngeal Swab; Respiratory  Result Value Ref Range Status   Adenovirus NOT DETECTED NOT DETECTED Final   Coronavirus 229E NOT DETECTED NOT DETECTED Final    Comment: (NOTE) The Coronavirus on the Respiratory Panel, DOES NOT test for the novel  Coronavirus (2019 nCoV)    Coronavirus HKU1 NOT DETECTED NOT DETECTED Final   Coronavirus NL63 NOT DETECTED NOT DETECTED Final   Coronavirus OC43 NOT DETECTED NOT DETECTED Final   Metapneumovirus NOT DETECTED NOT DETECTED Final   Rhinovirus / Enterovirus NOT DETECTED NOT DETECTED Final   Influenza A NOT DETECTED NOT DETECTED Final   Influenza B NOT DETECTED NOT DETECTED Final   Parainfluenza Virus 1 NOT DETECTED NOT DETECTED Final   Parainfluenza Virus 2 NOT DETECTED NOT DETECTED Final   Parainfluenza Virus 3 NOT DETECTED NOT DETECTED Final   Parainfluenza Virus 4 NOT DETECTED NOT DETECTED Final   Respiratory Syncytial Virus NOT DETECTED NOT DETECTED Final   Bordetella pertussis NOT DETECTED NOT DETECTED  Final   Bordetella Parapertussis NOT DETECTED NOT DETECTED Final   Chlamydophila pneumoniae NOT DETECTED NOT DETECTED Final   Mycoplasma pneumoniae NOT DETECTED NOT DETECTED Final    Comment: Performed at [facility], [place]. [address], [place], [place] [address]         Radiology Studies: No results found.      Scheduled Meds:  Chlorhexidine Gluconate Cloth  6 each Topical Daily   famotidine  20 mg Per Tube QHS   feeding supplement (PROSource TF20)  60 mL Per Tube Daily   Gerhardt's butt cream   Topical QID   hydrocortisone  25 mg Rectal BID   latanoprost  1 drop Both Eyes QHS   levETIRAcetam  500 mg Per Tube BID   metoprolol tartrate  12.5 mg Per Tube BID   nutrition supplement (JUVEN)  1 packet Per Tube BID BM   mouth rinse  15 mL Mouth Rinse Q2H   rosuvastatin  20 mg Per Tube Daily   Continuous Infusions:  sodium chloride Stopped (01/01/23 0424)   feeding supplement (JEVITY 1.2 CAL) 55 mL/hr at 01/07/23 1439     LOS: 40 days    Time spent: 45 minutes spent on chart review, discussion with nursing staff, consultants, updating family and interview/physical exam; more than 50% of that time was spent in counseling and/or coordination of care.    Geradine Girt, DO [facility] Available via Epic secure chat 7am-7pm After these hours, please refer to coverage provider listed on [email or web address] 01/08/2023, 10:47 AM

## 2023-01-08 NOTE — Progress Notes (Signed)
Physical Therapy Treatment Patient Details Name: Pamela Johnston MRN: [id number] DOB: 03-Dec-1941 Today's Date: 01/08/2023   History of Present Illness 81-yer-old woman who presented to [facility] ED 12/21 as a Code Stroke with +aphasia and R-sided weakness. 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 Pt underwent trach and peg on 12/19/22. PMHx significant for HTN, chronic sacroiliitis (managed with Percocet), RLS, osteopenia.    PT Comments    Pt continues to remain unresponsive, no eye opening to auditory response, name, or position change, noted bilat UE posturing into extension and internal rotation, and dependent for all mobility. Pts HR increased from 110s to 123bpm when changed to chair position in bed. Pt remains to have bilat knee contractions from sitting in w/c for last 20 years. Pts SPO2 89-90% on 6LO2 via trach collar. Acute PT to cont to monitor as POC is to transfer to SNF once pt on trach collar for a month.    Recommendations for follow up therapy are one component of a multi-disciplinary discharge planning process, led by the attending physician.  Recommendations may be updated based on patient status, additional functional criteria and insurance authorization.  Follow Up Recommendations  Long-term institutional care without follow-up therapy Can patient physically be transported by private vehicle: No   Assistance Recommended at Discharge Frequent or constant Supervision/Assistance  Patient can return home with the following  (unable to return home safely at this time)   Equipment Recommendations  None recommended by PT    Recommendations for Other Services       Precautions / Restrictions Precautions Precautions: Fall Precaution Comments: trach, posturing Restrictions Weight Bearing Restrictions: No     Mobility  Bed Mobility Overal bed mobility:  Needs Assistance Bed Mobility: Supine to Sit Rolling: Total assist, +2 for physical assistance   Supine to sit: Total assist (used egress in bed)     General bed mobility comments: pt with no active movement or participation. Pt on trach collar, SpO2 >96% on 6LO2 via [place], SPO2 89-90%    Transfers                   General transfer comment: unable    Ambulation/Gait               General Gait Details: unable   Stairs             Wheelchair Mobility    Modified Rankin (Stroke Patients Only) Modified Rankin (Stroke Patients Only) Pre-Morbid Rankin Score: Moderately severe disability Modified Rankin: Severe disability     Balance Overall balance assessment: Needs assistance Sitting-balance support: Feet unsupported, No upper extremity supported Sitting balance-Leahy Scale: Zero Sitting balance - Comments: reliant on support of bed in egress position                                    Cognition Arousal/Alertness: Lethargic Behavior During Therapy: Flat affect Overall Cognitive Status: Impaired/Different from baseline                                 General Comments: pt with no opening of eyes to name or auditory stimulus. Pt with sluggish L pupil restriction to light, disconjucate gaze with manual assist to open eyes. no command follow. withdrawal to pain  in bilat LEs but not UEs        Exercises Other Exercises Other Exercises: PROM x4 extremities, pt with noted posturing into extension in bilat UEs limiting ROM at shoulder. Pt with noted bilat knee contractures from prolonged sitting in w/c PTA    General Comments General comments (skin integrity, edema, etc.): bilat UE edema      Pertinent Vitals/Pain Pain Assessment Pain Assessment: Faces Faces Pain Scale: No hurt    Home Living                          Prior Function            PT Goals (current goals can now be found in the care plan  section) Acute Rehab PT Goals Patient Stated Goal: didn't state PT Goal Formulation: Patient unable to participate in goal setting Time For Goal Achievement: 01/22/23 Potential to Achieve Goals: Poor Progress towards PT goals: Not progressing toward goals - comment    Frequency    Min 1X/week (for trial)      PT Plan Current plan remains appropriate    Co-evaluation              AM-PAC PT "6 Clicks" Mobility   Outcome Measure  Help needed turning from your back to your side while in a flat bed without using bedrails?: Total Help needed moving from lying on your back to sitting on the side of a flat bed without using bedrails?: Total Help needed moving to and from a bed to a chair (including a wheelchair)?: Total Help needed standing up from a chair using your arms (e.g., wheelchair or bedside chair)?: Total Help needed to walk in hospital room?: Total Help needed climbing 3-5 steps with a railing? : Total 6 Click Score: 6    End of Session   Activity Tolerance: Patient limited by lethargy Patient left: in bed;with call bell/phone within reach;with bed alarm set;with family/visitor present Nurse Communication: Mobility status PT Visit Diagnosis: Difficulty in walking, not elsewhere classified (R26.2);Unsteadiness on feet (R26.81)     Time: 2778-2423 PT Time Calculation (min) (ACUTE ONLY): 19 min  Charges:  $Therapeutic Exercise: 8-22 mins                     Kittie Plater, PT, DPT Acute Rehabilitation Services Secure chat preferred Office #: [phone]    Berline Lopes 01/08/2023, 10:15 AM

## 2023-01-08 NOTE — Consult Note (Addendum)
[facility] Nurse Consult Note: Deep tissue pressure injury was first noted by nurses on 1/25, during the hospital pressure injury prevalence survey.  Right outer ankle is dark purple, .5X.5cm; appears to be slowly resolving. Pressure Injury POA: No Dressing procedure/placement/frequency: Topical treatment orders provided for bedside nurses to perform as follows to protect from further injury: Foam dressing to right ankle, change Q 3 days or PRN soiling. [place] team will reassess next week to determine if a change in the plan of care is indicated at that time. Thank-you,  Julien Girt MSN, [place], [place], [place], [place]

## 2023-01-09 DIAGNOSIS — I611 Nontraumatic intracerebral hemorrhage in hemisphere, cortical: Secondary | ICD-10-CM | POA: Diagnosis not present

## 2023-01-09 LAB — GLUCOSE, CAPILLARY
Glucose-Capillary: 102 mg/dL — ABNORMAL HIGH (ref 70–99)
Glucose-Capillary: 119 mg/dL — ABNORMAL HIGH (ref 70–99)
Glucose-Capillary: 120 mg/dL — ABNORMAL HIGH (ref 70–99)
Glucose-Capillary: 130 mg/dL — ABNORMAL HIGH (ref 70–99)
Glucose-Capillary: 131 mg/dL — ABNORMAL HIGH (ref 70–99)
Glucose-Capillary: 133 mg/dL — ABNORMAL HIGH (ref 70–99)

## 2023-01-09 NOTE — Progress Notes (Signed)
PROGRESS NOTE  Pamela Johnston  DOB: 08-28-1941  PCP: [name], MD GLO:[id number]  DOA: 11/29/2022  LOS: 59 days  Hospital Day: 16  Brief narrative: Pamela Johnston is a 82 y.o. female with PMH significant for hypertension. 12/21, patient presented to [facility] ED for decreased responsiveness, aphasia and right-sided weakness. 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  She had several episode of vomiting with concern of aspiration Neurosurgery, PCCM were consulted. Patient was intubated to protect airway, admitted to neuro ICU Started on 3% hypertonic saline. Timeline of events as below. Palliative care was consulted as well Over the course of hospitalization, various providers from different medical teams discussed with the family regarding her poor prognosis and recommended reconsideration of CODE STATUS.  Family however continues to hope for recovery and patient remains full code. 1/9, patient had family meeting with [name]. Per Dt. Hulen Skains "So moving forward, [name] (patient's son in law) is the designated medical decision maker for Pamela Johnston." 1/12, underwent trach and PEG. 1/25- concern for seizures-- keppra started after EEG done Patient is currently on trach collar.  Subjective: Patient was seen and examined this morning.  elderly Caucasian.  Breathing through trach collar.  Hemodynamically stable Not arousable.  No family at bedside. Chart reviewed Afebrile, heart rate in 110s this morning.  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 Brain compression/ICH Right parietal lobe hemorrhage with associated edema  Severe encephalopathy Intraventricular  hemorrhage and mass effect, vasogenic edema, subfalcine herniation Multiple family meetings concluded over time with CMO on board, medical decision maker is [name] patient's son-in-law Stroke team signed off Maintain neuro protective measures Nutrition and bowel regimen Aspiration precautions    Seizures On Keppra   Acute respiratory failure with hypoxia due to large intracerebral hemorrhage S/p trach/PEG 1/12 Pneumonia  -Previously completed 10 days of antibiotic coverage for anterobasilar pneumonia, repeat sputum culture obtained 1/15 positive for abundant Pseudomonas, abundant Staphylococcus aureus, and moderate Enterobacter, s/p course of Meropenem -Continue ATC as tolerated, hope to avoid vent needs -Wean O2 down as tolerated -Bronchial hygiene -culture still with pseudomonas-- ? Colonized now-- will hold on abx for now as no fever   Urinary retention -Was on Urecholine for 10mg  TID from 1/4 - 1/14 dose was then increased to 25mg  TID from 1/14 - 1/17 with continue retention seen  -Foley was taken out 12/26/2022 -New foley placed 1/23 Will need urologic follow up as outpatient   History of chronic sacroiliitis Osteopenia RLS -Supportive care   Mobility: Not unable to participate after stroke  Goals of care   Code Status: Full Code.  See above.   DVT prophylaxis:  SCD's Start: 11/29/22 2215   Antimicrobials: Currently none Fluid: Currently none Consultants: Currently none Family Communication: None at bedside  Status is: Inpatient Level of care: Progressive   Dispo: Patient is from: Home              Anticipated d/c is to: Education officer, museum in process for LTAC Continue in-hospital care because: Trach, PEG status, difficult to place.  Medically stable for discharge.   Scheduled Meds:  Chlorhexidine Gluconate Cloth  6 each Topical Daily   famotidine  20 mg Per Tube QHS   feeding supplement (PROSource  TF20)  60 mL Per Tube Daily   Gerhardt's butt cream    Topical QID   hydrocortisone  25 mg Rectal BID   latanoprost  1 drop Both Eyes QHS   levETIRAcetam  500 mg Per Tube BID   metoprolol tartrate  12.5 mg Per Tube BID   nutrition supplement (JUVEN)  1 packet Per Tube BID BM   mouth rinse  15 mL Mouth Rinse Q2H   rosuvastatin  20 mg Per Tube Daily    PRN meds: acetaminophen **OR** acetaminophen (TYLENOL) oral liquid 160 mg/5 mL **OR** acetaminophen, bisacodyl, docusate, morphine injection, mouth rinse, polyethylene glycol   Infusions:   sodium chloride Stopped (01/01/23 0424)   feeding supplement (JEVITY 1.2 CAL) 1,000 mL (01/09/23 2633)    Diet:  Diet Order             Diet NPO time specified  Diet effective midnight                   Antimicrobials: Anti-infectives (From admission, onward)    Start     Dose/Rate Route Frequency Ordered Stop   12/26/22 1545  meropenem (MERREM) 1 g in sodium chloride 0.9 % 100 mL IVPB        1 g 200 mL/hr over 30 Minutes Intravenous Every 12 hours 12/26/22 1454 12/30/22 2140   12/26/22 1015  ceFEPIme (MAXIPIME) 2 g in sodium chloride 0.9 % 100 mL IVPB  Status:  Discontinued        2 g 200 mL/hr over 30 Minutes Intravenous Every 12 hours 12/26/22 0918 12/26/22 1454   12/07/22 1400  sulfamethoxazole-trimethoprim (BACTRIM) 200-40 MG/5ML suspension 40 mL        40 mL Per Tube Every 12 hours 12/07/22 1313 12/16/22 2212   12/05/22 1100  Ampicillin-Sulbactam (UNASYN) 3 g in sodium chloride 0.9 % 100 mL IVPB  Status:  Discontinued        3 g 200 mL/hr over 30 Minutes Intravenous Every 6 hours 12/05/22 1014 12/07/22 1313   11/29/22 2330  Ampicillin-Sulbactam (UNASYN) 3 g in sodium chloride 0.9 % 100 mL IVPB  Status:  Discontinued        3 g 200 mL/hr over 30 Minutes Intravenous Every 6 hours 11/29/22 2316 12/04/22 0908       Skin assessment:  Pressure Injury 12/22/22 Ear Right Stage 1 -  Intact skin with non-blanchable redness of a localized area usually over a bony prominence. (Active)   12/22/22 1816  Location: Ear  Location Orientation: Right  Staging: Stage 1 -  Intact skin with non-blanchable redness of a localized area usually over a bony prominence.  Wound Description (Comments):   Present on Admission: No     Pressure Injury 12/28/22 Ankle Anterior;Right Deep Tissue Pressure Injury - Purple or maroon localized area of discolored intact skin or blood-filled blister due to damage of underlying soft tissue from pressure and/or shear. Pink abrasion with deep maro (Active)  12/28/22 1530  Location: Ankle  Location Orientation: Anterior;Right  Staging: Deep Tissue Pressure Injury - Purple or maroon localized area of discolored intact skin or blood-filled blister due to damage of underlying soft tissue from pressure and/or shear.  Wound Description (Comments): Pink abrasion with deep maroon center on right lateral ankle  Present on Admission: No      Nutritional status:  Body mass index is 24.87 kg/m.  Nutrition Problem: Inadequate oral intake Etiology: inability to eat Signs/Symptoms: NPO status     Objective: Vitals:  01/09/23 0822 01/09/23 1119  BP: (!) 114/55 (!) 105/50  Pulse: (!) 114 100  Resp: (!) 27 (!) 33  Temp:  98.3 F (36.8 C)  SpO2: 93% 96%    Intake/Output Summary (Last 24 hours) at 01/09/2023 1146 Last data filed at 01/09/2023 0518 Gross per 24 hour  Intake 1685.91 ml  Output 1950 ml  Net -264.09 ml   Filed Weights   01/03/23 0341 01/04/23 0500 01/08/23 0447  Weight: 59.7 kg 59 kg 59.7 kg   Weight change:  Body mass index is 24.87 kg/m.   Physical Exam: General exam: Elderly Caucasian female Skin: No rashes, lesions or ulcers. HEENT: Atraumatic, normocephalic, no obvious bleeding.  Trach collar anteriorly Lungs: Diminished air entry in both bases CVS: Regular rate and rhythm, no murmur GI/Abd PEG site intact.  No tenderness CNS: Unable to respond to my commands Psychiatry: Unable to examine because of altered  mentation Extremities: No pedal edema, no calf tenderness  Data Review: I have personally reviewed the laboratory data and studies available.  F/u labs  Unresulted Labs (From admission, onward)     Start     Ordered   12/25/22 0500  CBC with Differential/Platelet  Tomorrow morning,   R       Question:  Specimen collection method  Answer:  Lab=Lab collect   12/24/22 0937            Total time spent in review of labs and imaging, patient evaluation, formulation of plan, documentation and communication with family: 79 minutes  Signed, Terrilee Croak, MD [facility] 01/09/2023

## 2023-01-10 DIAGNOSIS — I611 Nontraumatic intracerebral hemorrhage in hemisphere, cortical: Secondary | ICD-10-CM | POA: Diagnosis not present

## 2023-01-10 LAB — GLUCOSE, CAPILLARY
Glucose-Capillary: 115 mg/dL — ABNORMAL HIGH (ref 70–99)
Glucose-Capillary: 126 mg/dL — ABNORMAL HIGH (ref 70–99)
Glucose-Capillary: 135 mg/dL — ABNORMAL HIGH (ref 70–99)
Glucose-Capillary: 136 mg/dL — ABNORMAL HIGH (ref 70–99)
Glucose-Capillary: 144 mg/dL — ABNORMAL HIGH (ref 70–99)
Glucose-Capillary: 154 mg/dL — ABNORMAL HIGH (ref 70–99)

## 2023-01-10 MED ORDER — PHENYLEPHRINE-MINERAL OIL-PET 0.25-14-74.9 % RE OINT: 1.0000 | TOPICAL_OINTMENT | Freq: Two times a day (BID) | RECTAL | Status: DC | PRN

## 2023-01-10 NOTE — Progress Notes (Signed)
Pt external hemmorrhoids noted to be bleeding slightly. MD notified request to d/c anusol supp and start external cream. See new orders.

## 2023-01-10 NOTE — Progress Notes (Signed)
Nutrition Follow-up  DOCUMENTATION CODES:   Not applicable  INTERVENTION:   Continue tube feeding via PEG tube: - Jevity 1.2 @ 55 ml/hr (1320 ml/day) - PROSource TF20 60 ml daily  Tube feeding regimen provides 1664 kcal, 93 grams of protein, and 1065 ml of H2O.   - Continue 1 packet Juven BID per tube, each packet provides 95 calories, 2.5 grams of protein, and 9.8 grams of carbohydrate; also contains L-arginine and L-glutamine, vitamin C, vitamin E, vitamin B-12, zinc, calcium, and calcium Beta-hydroxy-Beta-methylbutyrate to support wound healing  NUTRITION DIAGNOSIS:   Inadequate oral intake related to inability to eat as evidenced by NPO status.  Ongoing, being addressed via TF  GOAL:   Patient will meet greater than or equal to 90% of their needs  Met via TF  MONITOR:   Labs, Weight trends, TF tolerance, Skin  REASON FOR ASSESSMENT:   Consult Enteral/tube feeding initiation and management  ASSESSMENT:   82 year old female with PMHx of HTN, osteopenia, RLS, sacroiliitis admitted with large left hemispheric parietal/temporal IPH with vasogenic edema/mass effect, also with acute respiratory failure and concern for aspiration PNA given emesis per-intubation.  01/12 - s/p trach/PEG  Pt with continuous tube feeds infusing via PEG. Discussed with RN who reports pt is tolerating tube feeds without issue. Weight stable. Will continue with current tube feeding regimen and Juven to promote wound healing.  Current TF: Jevity 1.2 @ 55 ml/hr, PROSource TF20 60 ml daily  Admit weight: 57.8 kg Current weight: 59.7 kg  Medications reviewed and include: pepcid, Juven BID  Labs reviewed. CBG's: 115-133 x 24 hours  Diet Order:   Diet Order             Diet NPO time specified  Diet effective midnight                   EDUCATION NEEDS:   Not appropriate for education at this time  Skin:  Skin Assessment: Skin Integrity Issues: DTI: R ankle Stage I: R  ear  Last BM:  01/10/23  Height:   Ht Readings from Last 1 Encounters:  11/29/22 5\' 1"  (1.549 m)    Weight:   Wt Readings from Last 1 Encounters:  01/10/23 59.7 kg    Ideal Body Weight:  47.7 kg  BMI:  Body mass index is 24.87 kg/m.  Estimated Nutritional Needs:   Kcal:  1500-1700  Protein:  80-100 grams  Fluid:  1.5-1.7 L/day    Gustavus Bryant, MS, RD, LDN Inpatient Clinical Dietitian Please see AMiON for contact information.

## 2023-01-10 NOTE — Progress Notes (Signed)
PROGRESS NOTE  Pamela Johnston  DOB: 1941-10-27  PCP: [name], MD JKD:[id number]  DOA: 11/29/2022  LOS: 40 days  Hospital Day: 47  Brief narrative: Pamela Johnston is a 82 y.o. female with PMH significant for hypertension. 12/21, patient presented to [facility] ED for decreased responsiveness, aphasia and right-sided weakness. 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  She had several episode of vomiting with concern of aspiration Neurosurgery, PCCM were consulted. Patient was intubated to protect airway, admitted to neuro ICU Started on 3% hypertonic saline. Timeline of events as below. Palliative care was consulted as well Over the course of hospitalization, various providers from different medical teams discussed with the family regarding her poor prognosis and recommended reconsideration of CODE STATUS.  Family however continues to hope for recovery and patient remains full code. 1/9, patient had family meeting with [name]. Per Dt. Hulen Skains "So moving forward, [name] (patient's son in law) is the designated medical decision maker for Pamela Johnston." 1/12, underwent trach and PEG. 1/25- concern for seizures-- keppra started after EEG done Patient is currently on trach collar.  Subjective: Patient was seen and examined this morning.  elderly Caucasian.  Breathing through trach collar.  Hemodynamically stable Not arousable on Kairyn Olmeda stimulation.  No family at bedside.  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/mass effect Brain compression/ICH Right parietal lobe hemorrhage with associated edema  Severe encephalopathy Intraventricular hemorrhage and mass effect, vasogenic edema, subfalcine herniation Multiple family meetings concluded over time with CMO on board, medical decision  maker is [name] patient's son-in-law Stroke team signed off Maintain neuro protective measures Nutrition and bowel regimen Aspiration precautions    Seizures On Keppra   Acute respiratory failure with hypoxia due to large intracerebral hemorrhage S/p trach/PEG 1/12 Pneumonia  -Previously completed 10 days of antibiotic coverage for anterobasilar pneumonia, repeat sputum culture obtained 1/15 positive for abundant Pseudomonas, abundant Staphylococcus aureus, and moderate Enterobacter, s/p course of Meropenem -Continue ATC as tolerated, hope to avoid vent needs -Wean O2 down as tolerated -Bronchial hygiene -culture still with pseudomonas-- ? Colonized now-- will hold on abx for now as no fever   Urinary retention -Was on Urecholine for 10mg  TID from 1/4 - 1/14 dose was then increased to 25mg  TID from 1/14 - 1/17 with continue retention seen  -Foley was taken out 12/26/2022 -New foley placed 1/23 -Will need urologic follow up as outpatient   History of chronic sacroiliitis Osteopenia RLS -Supportive care   Mobility: Not unable to participate after stroke  Goals of care   Code Status: Full Code.  See above.   DVT prophylaxis:  SCD's Start: 11/29/22 2215   Antimicrobials: Currently none Fluid: Currently none Consultants: Currently none Family Communication: None at bedside  Status is: Inpatient Level of care: Progressive   Dispo: Patient is from: Home              Anticipated d/c is to: Education officer, museum in process for LTAC Continue in-hospital care because: Trach, PEG status, difficult to place.  Poor prognosis.    Scheduled Meds:  Chlorhexidine Gluconate Cloth  6 each Topical Daily   famotidine  20 mg Per Tube QHS   feeding supplement (PROSource TF20)  60 mL Per Tube Daily   Gerhardt's butt cream   Topical QID   latanoprost  1 drop Both Eyes QHS  levETIRAcetam  500 mg Per Tube BID   metoprolol tartrate  12.5 mg Per Tube BID   nutrition supplement (JUVEN)   1 packet Per Tube BID BM   mouth rinse  15 mL Mouth Rinse Q2H   rosuvastatin  20 mg Per Tube Daily    PRN meds: acetaminophen **OR** acetaminophen (TYLENOL) oral liquid 160 mg/5 mL **OR** acetaminophen, bisacodyl, docusate, morphine injection, mouth rinse, phenylephrine-shark liver oil-mineral oil-petrolatum, polyethylene glycol   Infusions:   sodium chloride Stopped (01/01/23 0424)   feeding supplement (JEVITY 1.2 CAL) 1,000 mL (01/10/23 0149)    Diet:  Diet Order             Diet NPO time specified  Diet effective midnight                   Antimicrobials: Anti-infectives (From admission, onward)    Start     Dose/Rate Route Frequency Ordered Stop   12/26/22 1545  meropenem (MERREM) 1 g in sodium chloride 0.9 % 100 mL IVPB        1 g 200 mL/hr over 30 Minutes Intravenous Every 12 hours 12/26/22 1454 12/30/22 2140   12/26/22 1015  ceFEPIme (MAXIPIME) 2 g in sodium chloride 0.9 % 100 mL IVPB  Status:  Discontinued        2 g 200 mL/hr over 30 Minutes Intravenous Every 12 hours 12/26/22 0918 12/26/22 1454   12/07/22 1400  sulfamethoxazole-trimethoprim (BACTRIM) 200-40 MG/5ML suspension 40 mL        40 mL Per Tube Every 12 hours 12/07/22 1313 12/16/22 2212   12/05/22 1100  Ampicillin-Sulbactam (UNASYN) 3 g in sodium chloride 0.9 % 100 mL IVPB  Status:  Discontinued        3 g 200 mL/hr over 30 Minutes Intravenous Every 6 hours 12/05/22 1014 12/07/22 1313   11/29/22 2330  Ampicillin-Sulbactam (UNASYN) 3 g in sodium chloride 0.9 % 100 mL IVPB  Status:  Discontinued        3 g 200 mL/hr over 30 Minutes Intravenous Every 6 hours 11/29/22 2316 12/04/22 0908       Skin assessment:  Pressure Injury 12/22/22 Ear Right Stage 1 -  Intact skin with non-blanchable redness of a localized area usually over a bony prominence. (Active)  12/22/22 1816  Location: Ear  Location Orientation: Right  Staging: Stage 1 -  Intact skin with non-blanchable redness of a localized area usually  over a bony prominence.  Wound Description (Comments):   Present on Admission: No     Pressure Injury 12/28/22 Ankle Anterior;Right Deep Tissue Pressure Injury - Purple or maroon localized area of discolored intact skin or blood-filled blister due to damage of underlying soft tissue from pressure and/or shear. Pink abrasion with deep maro (Active)  12/28/22 1530  Location: Ankle  Location Orientation: Anterior;Right  Staging: Deep Tissue Pressure Injury - Purple or maroon localized area of discolored intact skin or blood-filled blister due to damage of underlying soft tissue from pressure and/or shear.  Wound Description (Comments): Pink abrasion with deep maroon center on right lateral ankle  Present on Admission: No      Nutritional status:  Body mass index is 24.87 kg/m.  Nutrition Problem: Inadequate oral intake Etiology: inability to eat Signs/Symptoms: NPO status     Objective: Vitals:   01/10/23 1120 01/10/23 1208  BP: 117/61   Pulse: (!) 101 90  Resp: (!) 31 (!) 27  Temp: 99.5 F (37.5 C)   SpO2: 98% 96%  Intake/Output Summary (Last 24 hours) at 01/10/2023 1359 Last data filed at 01/09/2023 1820 Gross per 24 hour  Intake --  Output 300 ml  Net -300 ml    Filed Weights   01/04/23 0500 01/08/23 0447 01/10/23 0419  Weight: 59 kg 59.7 kg 59.7 kg   Weight change:  Body mass index is 24.87 kg/m.   Physical Exam: General exam: Elderly Caucasian female Skin: No rashes, lesions or ulcers. HEENT: Diminished air entry in both bases CVS: Regular rate and rhythm, no murmur GI/Abd PEG site intact.  No tenderness CNS: Unable to respond to commands Psychiatry: Unable to examine because of altered mentation Extremities: No pedal edema, no calf tenderness  Data Review: I have personally reviewed the laboratory data and studies available.  F/u labs  Unresulted Labs (From admission, onward)     Start     Ordered   01/11/23 0500  CBC with Differential/Platelet   Tomorrow morning,   R       Question:  Specimen collection method  Answer:  Lab=Lab collect   01/10/23 0740   01/11/23 3825  Basic metabolic panel  Tomorrow morning,   R       Question:  Specimen collection method  Answer:  Lab=Lab collect   01/10/23 0740   12/25/22 0500  CBC with Differential/Platelet  Tomorrow morning,   R       Question:  Specimen collection method  Answer:  Lab=Lab collect   12/24/22 0937            Total time spent in review of labs and imaging, patient evaluation, formulation of plan, documentation and communication with family: 25 minutes  Signed, Terrilee Croak, MD [facility] 01/10/2023

## 2023-01-10 NOTE — TOC Progression Note (Addendum)
Transition of Care [facility]) - Progression Note    Patient Details  Name: Pamela Johnston MRN: [id number] Date of Birth: 1941/02/17  Transition of Care [facility] CM/SW [place], RN Phone Number: 01/10/2023, 4:16 PM  Clinical Narrative:    Patient remains unresponsive. Due to her persistent state, she is not a candidate for rehabilitation, but more LTC.  Medicaid has been applied for via [employer]( documentation sent of incapacity). Patient husband has dementia, ethics consulted and found that Son in [place] was named Media planner. Information has been updated on chart.  Trach placed 1/11 would be ready for SNF on 2/11 will need updated FL2    TOC will continue to follow for transitions of care   Expected Discharge Plan: [place] Barriers to Discharge: Insurance Authorization, SNF Pending bed offer  Expected Discharge Plan and Services In-house Referral: Clinical Social Work Discharge Planning Services: CM Consult Post Acute Care Choice: [place] arrangements for the past 2 months: Single Family Home                                       Social Determinants of Health (SDOH) Interventions    Readmission Risk Interventions     No data to display

## 2023-01-11 DIAGNOSIS — I611 Nontraumatic intracerebral hemorrhage in hemisphere, cortical: Secondary | ICD-10-CM | POA: Diagnosis not present

## 2023-01-11 LAB — CBC WITH DIFFERENTIAL/PLATELET
Abs Immature Granulocytes: 0.02 10*3/uL (ref 0.00–0.07)
Basophils Absolute: 0.1 10*3/uL (ref 0.0–0.1)
Basophils Relative: 1 %
Eosinophils Absolute: 0.3 10*3/uL (ref 0.0–0.5)
Eosinophils Relative: 4 %
HCT: 32.1 % — ABNORMAL LOW (ref 36.0–46.0)
Hemoglobin: 9.9 g/dL — ABNORMAL LOW (ref 12.0–15.0)
Immature Granulocytes: 0 %
Lymphocytes Relative: 28 %
Lymphs Abs: 2.2 10*3/uL (ref 0.7–4.0)
MCH: 30.4 pg (ref 26.0–34.0)
MCHC: 30.8 g/dL (ref 30.0–36.0)
MCV: 98.5 fL (ref 80.0–100.0)
Monocytes Absolute: 0.8 10*3/uL (ref 0.1–1.0)
Monocytes Relative: 10 %
Neutro Abs: 4.4 10*3/uL (ref 1.7–7.7)
Neutrophils Relative %: 57 %
Platelets: 391 10*3/uL (ref 150–400)
RBC: 3.26 MIL/uL — ABNORMAL LOW (ref 3.87–5.11)
RDW: 15.1 % (ref 11.5–15.5)
WBC: 7.7 10*3/uL (ref 4.0–10.5)
nRBC: 0 % (ref 0.0–0.2)

## 2023-01-11 LAB — GLUCOSE, CAPILLARY
Glucose-Capillary: 115 mg/dL — ABNORMAL HIGH (ref 70–99)
Glucose-Capillary: 157 mg/dL — ABNORMAL HIGH (ref 70–99)
Glucose-Capillary: 154 mg/dL — ABNORMAL HIGH (ref 70–99)
Glucose-Capillary: 116 mg/dL — ABNORMAL HIGH (ref 70–99)
Glucose-Capillary: 130 mg/dL — ABNORMAL HIGH (ref 70–99)
Glucose-Capillary: 128 mg/dL — ABNORMAL HIGH (ref 70–99)

## 2023-01-11 LAB — BASIC METABOLIC PANEL
Anion gap: 7 (ref 5–15)
BUN: 31 mg/dL — ABNORMAL HIGH (ref 8–23)
CO2: 26 mmol/L (ref 22–32)
Calcium: 8.2 mg/dL — ABNORMAL LOW (ref 8.9–10.3)
Chloride: 106 mmol/L (ref 98–111)
Creatinine, Ser: 0.41 mg/dL — ABNORMAL LOW (ref 0.44–1.00)
GFR, Estimated: >60 mL/min (ref >60)
Glucose, Bld: 130 mg/dL — ABNORMAL HIGH (ref 70–99)
Potassium: 3.9 mmol/L (ref 3.5–5.1)
Sodium: 139 mmol/L (ref 135–145)

## 2023-01-11 NOTE — Progress Notes (Signed)
PROGRESS NOTE  Pamela Johnston  DOB: 05-10-1941  PCP: [name], MD OEV:[id number]  DOA: 11/29/2022  LOS: 36 days  Hospital Day: 38  Brief narrative: Pamela Johnston is a 82 y.o. female with PMH significant for hypertension. 12/21, patient presented to [facility] ED for decreased responsiveness, aphasia and right-sided weakness. 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  She had several episode of vomiting with concern of aspiration Neurosurgery, PCCM were consulted. Patient was intubated to protect airway, admitted to neuro ICU Started on 3% hypertonic saline. Timeline of events as below. Palliative care was consulted as well Over the course of hospitalization, various providers from different medical teams discussed with the family regarding her poor prognosis and recommended reconsideration of CODE STATUS.  Family however continues to hope for recovery and patient remains full code. 1/9, patient had family meeting with [name]. Per Dt. Hulen Skains "So moving forward, Pamela Johnston (patient's son in law) is the designated medical decision maker for Pamela Johnston." 1/12, underwent trach and PEG. 1/25- concern for seizures-- keppra started after EEG done Patient is currently on trach collar.  Subjective: Patient was seen and examined this morning.   Elderly female.  Not arousable.  On tracheostomy tube no family at bedside Had 1 episode of fever 101.7 last night.  Assessment and plan: # Fever Patient had 1 episode of fever 101.7 last night. Hemodynamically stable. Fever likely because of ICH.  No evidence of infection at this time.  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/mass effect Brain compression/ICH Right parietal lobe hemorrhage with associated edema  Severe encephalopathy Intraventricular hemorrhage  and mass effect, vasogenic edema, subfalcine herniation Multiple family meetings concluded over time with CMO on board, medical decision maker is Pamela Johnston patient's son-in-law Stroke team signed off Maintain neuro protective measures Nutrition and bowel regimen Aspiration precautions    Seizures On Keppra   Acute respiratory failure with hypoxia due to large intracerebral hemorrhage S/p trach/PEG 1/12 Pneumonia  -Previously completed 10 days of antibiotic coverage for anterobasilar pneumonia, repeat sputum culture obtained 1/15 positive for abundant Pseudomonas, abundant Staphylococcus aureus, and moderate Enterobacter, s/p course of Meropenem -Continue ATC as tolerated, hope to avoid vent needs -Wean O2 down as tolerated -Bronchial hygiene -culture still with pseudomonas-- ? Colonized now-- will hold on abx for now as no fever   Urinary retention -Was on Urecholine for 10mg  TID from 1/4 - 1/14 dose was then increased to 25mg  TID from 1/14 - 1/17 with continue retention seen  -Foley was taken out 12/26/2022 -New foley placed 1/23 -Will need urologic follow up as outpatient   History of chronic sacroiliitis Osteopenia RLS -Supportive care   Mobility: Not unable to participate after stroke  Goals of care   Code Status: Full Code.  See above.   DVT prophylaxis:  SCD's Start: 11/29/22 2215   Antimicrobials: Currently none Fluid: Currently none Consultants: Currently none Family Communication: None at bedside  Status is: Inpatient Level of care: Progressive   Dispo: Patient is from: Home              Anticipated d/c is to: Education officer, museum in process for [employer].  Tentative plan to discharge on 2/11. Continue in-hospital care because: Trach, PEG status, difficult to place.  Poor prognosis.    Scheduled Meds:  Chlorhexidine Gluconate Cloth  6 each Topical Daily   famotidine  20 mg Per Tube QHS   feeding supplement (PROSource TF20)  60 mL Per Tube Daily   Gerhardt's  butt cream   Topical QID   latanoprost  1 drop Both Eyes QHS   levETIRAcetam  500 mg Per Tube BID   metoprolol tartrate  12.5 mg Per Tube BID   nutrition supplement (JUVEN)  1 packet Per Tube BID BM   mouth rinse  15 mL Mouth Rinse Q2H   rosuvastatin  20 mg Per Tube Daily    PRN meds: acetaminophen **OR** acetaminophen (TYLENOL) oral liquid 160 mg/5 mL **OR** acetaminophen, bisacodyl, docusate, morphine injection, mouth rinse, phenylephrine-shark liver oil-mineral oil-petrolatum, polyethylene glycol   Infusions:   sodium chloride Stopped (01/01/23 0424)   feeding supplement (JEVITY 1.2 CAL) 1,000 mL (01/10/23 0149)    Diet:  Diet Order             Diet NPO time specified  Diet effective midnight                   Antimicrobials: Anti-infectives (From admission, onward)    Start     Dose/Rate Route Frequency Ordered Stop   12/26/22 1545  meropenem (MERREM) 1 g in sodium chloride 0.9 % 100 mL IVPB        1 g 200 mL/hr over 30 Minutes Intravenous Every 12 hours 12/26/22 1454 12/30/22 2140   12/26/22 1015  ceFEPIme (MAXIPIME) 2 g in sodium chloride 0.9 % 100 mL IVPB  Status:  Discontinued        2 g 200 mL/hr over 30 Minutes Intravenous Every 12 hours 12/26/22 0918 12/26/22 1454   12/07/22 1400  sulfamethoxazole-trimethoprim (BACTRIM) 200-40 MG/5ML suspension 40 mL        40 mL Per Tube Every 12 hours 12/07/22 1313 12/16/22 2212   12/05/22 1100  Ampicillin-Sulbactam (UNASYN) 3 g in sodium chloride 0.9 % 100 mL IVPB  Status:  Discontinued        3 g 200 mL/hr over 30 Minutes Intravenous Every 6 hours 12/05/22 1014 12/07/22 1313   11/29/22 2330  Ampicillin-Sulbactam (UNASYN) 3 g in sodium chloride 0.9 % 100 mL IVPB  Status:  Discontinued        3 g 200 mL/hr over 30 Minutes Intravenous Every 6 hours 11/29/22 2316 12/04/22 0908       Skin assessment:  Pressure Injury 12/22/22 Ear Right Stage 1 -  Intact skin with non-blanchable redness of a localized area usually over a  bony prominence. (Active)  12/22/22 1816  Location: Ear  Location Orientation: Right  Staging: Stage 1 -  Intact skin with non-blanchable redness of a localized area usually over a bony prominence.  Wound Description (Comments):   Present on Admission: No     Pressure Injury 12/28/22 Ankle Anterior;Right Deep Tissue Pressure Injury - Purple or maroon localized area of discolored intact skin or blood-filled blister due to damage of underlying soft tissue from pressure and/or shear. Pink abrasion with deep maro (Active)  12/28/22 1530  Location: Ankle  Location Orientation: Anterior;Right  Staging: Deep Tissue Pressure Injury - Purple or maroon localized area of discolored intact skin or blood-filled blister due to damage of underlying soft tissue from pressure and/or shear.  Wound Description (Comments): Pink abrasion with deep maroon center on right lateral ankle  Present on Admission: No      Nutritional status:  Body mass index is 24.87 kg/m.  Nutrition Problem: Inadequate oral intake Etiology: inability to eat Signs/Symptoms: NPO status  Objective: Vitals:   01/11/23 0851 01/11/23 1125  BP: 130/66 (!) 111/47  Pulse: (!) 121 96  Resp: (!) 28 (!) 26  Temp:  99.5 F (37.5 C)  SpO2: 98% 98%    Intake/Output Summary (Last 24 hours) at 01/11/2023 1349 Last data filed at 01/10/2023 1855 Gross per 24 hour  Intake --  Output 400 ml  Net -400 ml    Filed Weights   01/04/23 0500 01/08/23 0447 01/10/23 0419  Weight: 59 kg 59.7 kg 59.7 kg   Weight change:  Body mass index is 24.87 kg/m.   Physical Exam: General exam: Elderly Caucasian female Skin: No rashes, lesions or ulcers. HEENT: Diminished air entry in both bases CVS: Regular rate and rhythm, no murmur GI/Abd PEG site intact.  No tenderness CNS: Unable to respond to commands Psychiatry: Unable to examine because of altered mentation Extremities: No pedal edema, no calf tenderness  Data Review: I have  personally reviewed the laboratory data and studies available.  F/u labs  Unresulted Labs (From admission, onward)     Start     Ordered   12/25/22 0500  CBC with Differential/Platelet  Tomorrow morning,   R       Question:  Specimen collection method  Answer:  Lab=Lab collect   12/24/22 0937            Total time spent in review of labs and imaging, patient evaluation, formulation of plan, documentation and communication with family: 25 minutes  Signed, Terrilee Croak, MD [facility] 01/11/2023

## 2023-01-11 NOTE — Progress Notes (Signed)
     Referral previously received for Christella Hartigan for goals of care discussion. Noted most recent palliative in-person assessment dated 12/18/2022 at which time it was recommended to follow peripherally unless new needs identified. Noted continued desire for full scope, full code. No new palliative needs since then. Goals were clear at that time.  At this point palliative medicine will sign off. Please contact us for any new/urgent needs that require palliative reinvolvement with this patient.  Thank you for your referral and allowing PMT to assist in [facility] care.   Walden Field, NP Palliative Medicine Team Phone: [phone]  NO CHARGE

## 2023-01-12 ENCOUNTER — Inpatient Hospital Stay (HOSPITAL_COMMUNITY): Payer: Medicare PPO

## 2023-01-12 DIAGNOSIS — I10 Essential (primary) hypertension: Secondary | ICD-10-CM | POA: Diagnosis not present

## 2023-01-12 DIAGNOSIS — I611 Nontraumatic intracerebral hemorrhage in hemisphere, cortical: Secondary | ICD-10-CM | POA: Diagnosis not present

## 2023-01-12 DIAGNOSIS — Z9911 Dependence on respirator [ventilator] status: Secondary | ICD-10-CM

## 2023-01-12 DIAGNOSIS — J9601 Acute respiratory failure with hypoxia: Secondary | ICD-10-CM | POA: Diagnosis not present

## 2023-01-12 LAB — GLUCOSE, CAPILLARY
Glucose-Capillary: 113 mg/dL — ABNORMAL HIGH (ref 70–99)
Glucose-Capillary: 117 mg/dL — ABNORMAL HIGH (ref 70–99)
Glucose-Capillary: 118 mg/dL — ABNORMAL HIGH (ref 70–99)
Glucose-Capillary: 125 mg/dL — ABNORMAL HIGH (ref 70–99)
Glucose-Capillary: 128 mg/dL — ABNORMAL HIGH (ref 70–99)
Glucose-Capillary: 153 mg/dL — ABNORMAL HIGH (ref 70–99)

## 2023-01-12 LAB — D-DIMER, QUANTITATIVE: D-Dimer, Quant: 7.95 ug/mL-FEU — ABNORMAL HIGH (ref 0.00–0.50)

## 2023-01-12 NOTE — Progress Notes (Signed)
VASCULAR LAB    Bilateral lower extremity venous duplex has been performed.  See CV proc for preliminary results.   Anaissa Macfadden, RVT 01/12/2023, 5:14 PM

## 2023-01-12 NOTE — Progress Notes (Addendum)
PROGRESS NOTE  Pamela Johnston QIH:[id number] DOB: [birth year] DOA: 11/29/2022 PCP: Pamela Aly, MD   LOS: 52 days   Brief Narrative / Interim history: 82 year old female with history of hypertension who comes into the hospital and is admitted on 11/29/2022 due to decreased responsiveness, aphasia, right-sided weakness.  CT head on admission showed large IPH of left parietal/left temporal regions with extensions into basal ganglia, secondary dissection with IVH and surrounding vasogenic edema with regional mass effect.  She also had several episodes of vomiting with concern for aspiration.  Neurosurgery, neurology were consulted, she was intubated and admitted to neuro ICU.  She was started on 3% hypertonic saline and palliative care was consulted due to poor prognosis.  She eventually underwent tracheostomy as well as PEG placement, and was also started on Keppra due to concern for seizures.  She is currently on trach collar.  Multiple palliative care discussions have been ongoing with the family, at 1 point hospital leadership was involved, she remains full code/full scope.  Earlier she could be placed is a month out from her tracheostomy placement, 2/12.  Significant events: 12/21 - Presented to [facility] ED via EMS as Code Stroke. LKW 2000. Abrupt onset of decreased responsiveness, aphasia and R hemiplegia. Stroke admitting, NSGY consulted. PCCM consult for vent management. Treated with unasyn for aspiration pneumonia. CT Head with large L hemispheric parietal/temporal lobe IPH with extension into basal ganglia and IVH. HTS 3% started.   12/24 - Palliative care consulted, family requested meeting to be delayed 12/25 - Family requested goals of care meeting to be delayed 12/26 - Stopped Unasyn, replete phos; [name] GOC conversation: family says full code 12/27 - Restarted Unasyn for fever, RLL infiltrate; resp culture: enterobacter, MDR. Palliative consulted  12/28 - CT Head unchanged 12/29 - Unasyn  changed to bactrim 12/31 - Posturing, CT head with increased shift, increased bleeding 1/2 - Family fired CVA service after discussion RE poor prognosis  1/4 - Off sedation  1/5 - No acute issues overnight. Remains on vent with increased respirations off sedation. Fever curve slightly decreased  1/6 - Pall care meeting moved to 2pm today. Pamela Johnston the son-in-law will be present. On ven 30%/ On TF. Neo not started last night. MAP > 65 after fluids bolus. Febrile +.  Last CT head 12/09/22.  1/8 - No significant neurologic/clinical change. Brief GOC discussion with husband, Pamela Johnston, at bedside in AM. Concerns from staff re: patient GOC/decisionmaking, want to ensure husband is included in decisions/information sharing. Attempted bedside meeting but family prematurely ended meeting due to escalated discussion. 1/9 - Febrile overnight to 101.53F, remains on bromocriptine. No significant neurologic exam changes. 1/10 Family meeting yesterday with [name], plan for trach/peg though husband says they are still discussing this morning. Per Dt. Hulen Skains " So moving forward, Pamela Johnston is the designated medical decision maker for Pamela Johnston " 1/11 no new issues, plan for PEG tomorrow 1/12 for trach/peg today 1/15 No change in Neuro status , trach and PEG, No weaning , will repeat CT head without, get Case management involved in looking for LTAC ( Day 3 post trach on 1/12)   1/15-CT with some stabilization 1/19 No acute events overnight, respiratory culture 1/15 with abundant Pseudomonas, abundant Staphylococcus aureus, and moderate Enterobacter remains on Meropenem  1/22 trach collar trial 12 + hrs 1/24 trach collar x 24 hours on 35% 1/25- concern for seizures-- keppra started after EEG done  Subjective / 24h Interval events: Unresponsive.  Husband is at bedside, he  tells me he does not see any meaningful improvement  Assesement and Plan: Principal Problem:   ICH (intracerebral hemorrhage) (HCC) Active  Problems:   RLS (restless legs syndrome)   Osteopenia   Sacroiliitis (HCC)   Benign essential HTN   Aspiration into airway   Hypoxia   Intraparenchymal hemorrhage of brain (HCC)   Brain compression (HCC)   Acute respiratory failure with hypoxia (HCC)   Pneumonia due to Enterobacter aerogenes (HCC)   Goals of care, counseling/discussion   Pressure injury of skin   Tracheostomy status ([place])   Principal problem 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/mass effect Brain compression/ICH Right parietal lobe hemorrhage with associated edema  Severe encephalopathy Intraventricular hemorrhage and mass effect, vasogenic edema, subfalcine herniation -Per prior team members, she is a very poor prognosis.  Multiple family meetings concluded over time with CMO on board, medical decision maker is Pamela Johnston patient's son-in-law. Stroke team signed off. Maintain neuro protective measures. Nutrition and bowel regimen. Aspiration precautions   Active problems Fevers -patient had an episode of fever of one 1.7 on 2/1 evening, and has been having low-grade temp of 100.3 last night.  Continue to closely monitor, chest x-ray showed minimal atelectasis but no clear-cut infiltrates.  Prior respiratory cultures showing Pseudomonas, most recent on 01/03/2023 showing Carbapenem resistance.  She has been treated for pneumonia in the past, monitor off antibiotics, but if needed could use Zerbaxa per ID given resistance.  Obtain a D-dimer, if elevated will look for blood clot, however she cannot be anticoagulated and this will be mostly for prognostic purposes  Seizures - On Keppra   Acute respiratory failure with hypoxia due to large intracerebral hemorrhage - S/p trach/PEG 1/12. Previously completed 10 days of antibiotic coverage for anterobasilar pneumonia, repeat sputum culture obtained 1/15 positive for abundant Pseudomonas, abundant Staphylococcus aureus, and moderate  Enterobacter, s/p course of Meropenem -Continue ATC as tolerated, hope to avoid vent needs -Wean O2 down as tolerated -Bronchial hygiene -culture still with pseudomonas-- ? Colonized now-- will hold on abx for now as no fever   Urinary retention -foley placed 1/23, Will need urologic follow up as outpatient   History of chronic sacroiliitis Osteopenia RLS -Supportive care  Scheduled Meds:  Chlorhexidine Gluconate Cloth  6 each Topical Daily   famotidine  20 mg Per Tube QHS   feeding supplement (PROSource TF20)  60 mL Per Tube Daily   Gerhardt's butt cream   Topical QID   latanoprost  1 drop Both Eyes QHS   levETIRAcetam  500 mg Per Tube BID   metoprolol tartrate  12.5 mg Per Tube BID   nutrition supplement (JUVEN)  1 packet Per Tube BID BM   mouth rinse  15 mL Mouth Rinse Q2H   rosuvastatin  20 mg Per Tube Daily   Continuous Infusions:  sodium chloride Stopped (01/01/23 0424)   feeding supplement (JEVITY 1.2 CAL) 1,000 mL (01/11/23 1514)   PRN Meds:.acetaminophen **OR** acetaminophen (TYLENOL) oral liquid 160 mg/5 mL **OR** acetaminophen, bisacodyl, docusate, morphine injection, mouth rinse, phenylephrine-shark liver oil-mineral oil-petrolatum, polyethylene glycol  Current Outpatient Medications  Medication Instructions   latanoprost (XALATAN) 0.005 % ophthalmic solution 1 drop, Both Eyes, Daily at bedtime   oxyCODONE-acetaminophen (PERCOCET/ROXICET) 5-325 MG tablet 1 tablet, Oral, Every 4 hours PRN   pramipexole (MIRAPEX) 0.25 mg, Oral, Nightly    Diet Orders (From admission, onward)     Start     Ordered   12/19/22 0001  Diet NPO time specified  Diet effective midnight        12/18/22 1632            DVT prophylaxis: SCD's Start: 11/29/22 2215   Lab Results  Component Value Date   PLT 391 01/11/2023      Code Status: Full Code  Family Communication: Husband present at bedside  Status is: Inpatient  Remains inpatient appropriate because: Severity of  illness  Level of care: Progressive  Consultants:  Neurology Neurosurgery PCCM Palliative care  Objective: Vitals:   01/12/23 0342 01/12/23 0500 01/12/23 0747 01/12/23 0818  BP:    126/67  Pulse: (!) 120   (!) 117  Resp: (!) 31   (!) 32  Temp:   99.3 F (37.4 C)   TempSrc:   Axillary   SpO2:    97%  Weight:  59.7 kg    Height:        Intake/Output Summary (Last 24 hours) at 01/12/2023 1024 Last data filed at 01/12/2023 0315 Gross per 24 hour  Intake --  Output 1350 ml  Net -1350 ml   Wt Readings from Last 3 Encounters:  01/12/23 59.7 kg    Examination:  Constitutional: NAD Eyes: no scleral icterus ENMT: Mucous membranes are moist.  Neck: normal, supple Respiratory: clear to auscultation bilaterally, no wheezing, no crackles. Normal respiratory effort. No accessory muscle use.  Cardiovascular: Regular rate and rhythm, no murmurs / rubs / gallops. No LE edema.  Abdomen: non distended, no tenderness. Bowel sounds positive.  Musculoskeletal: no clubbing / cyanosis.   Data Reviewed: I have independently reviewed following labs and imaging studies   CBC Recent Labs  Lab 01/06/23 0405 01/11/23 0349  WBC 9.0 7.7  HGB 9.0* 9.9*  HCT 29.3* 32.1*  PLT 461* 391  MCV 97.3 98.5  MCH 29.9 30.4  MCHC 30.7 30.8  RDW 13.6 15.1  LYMPHSABS  --  2.2  MONOABS  --  0.8  EOSABS  --  0.3  BASOSABS  --  0.1    Recent Labs  Lab 01/06/23 0405 01/11/23 0349  NA 137 139  K 3.6 3.9  CL 104 106  CO2 26 26  GLUCOSE 125* 130*  BUN 34* 31*  CREATININE 0.53 0.41*  CALCIUM 8.0* 8.2*    ------------------------------------------------------------------------------------------------------------------ No results for input(s): "CHOL", "HDL", "LDLCALC", "TRIG", "CHOLHDL", "LDLDIRECT" in the last 72 hours.  Lab Results  Component Value Date   HGBA1C 5.5 12/01/2022    ------------------------------------------------------------------------------------------------------------------ No results for input(s): "TSH", "T4TOTAL", "T3FREE", "THYROIDAB" in the last 72 hours.  Invalid input(s): "FREET3"  Cardiac Enzymes No results for input(s): "CKMB", "TROPONINI", "MYOGLOBIN" in the last 168 hours.  Invalid input(s): "CK" ------------------------------------------------------------------------------------------------------------------ No results found for: "BNP"  CBG: Recent Labs  Lab 01/11/23 1541 01/11/23 1933 01/11/23 2309 01/12/23 0312 01/12/23 0739  GLUCAP 116* 130* 128* 125* 113*    Recent Results (from the past 240 hour(s))  Culture, Respiratory w Gram Stain     Status: None   Collection Time: 01/03/23  3:47 PM   Specimen: Tracheal Aspirate; Respiratory  Result Value Ref Range Status   Specimen Description TRACHEAL ASPIRATE  Final   Special Requests NONE  Final   Gram Stain   Final    ABUNDANT SQUAMOUS EPITHELIAL CELLS PRESENT ABUNDANT WBC PRESENT, PREDOMINANTLY PMN ABUNDANT GRAM NEGATIVE RODS    Culture   Final    ABUNDANT PSEUDOMONAS AERUGINOSA MULTI-DRUG RESISTANT ORGANISM CRITICAL RESULT CALLED TO, READ BACK BY AND VERIFIED WITH: PHARMD J MILLEN [id number] AT 1011 BY  CM Performed at [facility], [place] [address] [place], [place] [address]    Report Status 01/06/2023 FINAL  Final   Organism ID, Bacteria PSEUDOMONAS AERUGINOSA  Final      Susceptibility   Pseudomonas aeruginosa - MIC*    CEFTAZIDIME 16 INTERMEDIATE Intermediate     CIPROFLOXACIN 1 INTERMEDIATE Intermediate     GENTAMICIN <=1 SENSITIVE Sensitive     IMIPENEM >=16 RESISTANT Resistant     * ABUNDANT PSEUDOMONAS AERUGINOSA  Respiratory (~20 pathogens) panel by PCR     Status: None   Collection Time: 01/05/23  9:56 AM   Specimen: Nasopharyngeal Swab; Respiratory  Result Value Ref Range Status   Adenovirus NOT DETECTED NOT DETECTED Final   Coronavirus 229E  NOT DETECTED NOT DETECTED Final    Comment: (NOTE) The Coronavirus on the Respiratory Panel, DOES NOT test for the novel  Coronavirus (2019 nCoV)    Coronavirus HKU1 NOT DETECTED NOT DETECTED Final   Coronavirus NL63 NOT DETECTED NOT DETECTED Final   Coronavirus OC43 NOT DETECTED NOT DETECTED Final   Metapneumovirus NOT DETECTED NOT DETECTED Final   Rhinovirus / Enterovirus NOT DETECTED NOT DETECTED Final   Influenza A NOT DETECTED NOT DETECTED Final   Influenza B NOT DETECTED NOT DETECTED Final   Parainfluenza Virus 1 NOT DETECTED NOT DETECTED Final   Parainfluenza Virus 2 NOT DETECTED NOT DETECTED Final   Parainfluenza Virus 3 NOT DETECTED NOT DETECTED Final   Parainfluenza Virus 4 NOT DETECTED NOT DETECTED Final   Respiratory Syncytial Virus NOT DETECTED NOT DETECTED Final   Bordetella pertussis NOT DETECTED NOT DETECTED Final   Bordetella Parapertussis NOT DETECTED NOT DETECTED Final   Chlamydophila pneumoniae NOT DETECTED NOT DETECTED Final   Mycoplasma pneumoniae NOT DETECTED NOT DETECTED Final    Comment: Performed at [facility], [place]. [address], [place], [place] [address]     Radiology Studies: DG CHEST PORT 1 VIEW  Result Date: 01/12/2023 CLINICAL DATA:  Fever EXAM: PORTABLE CHEST 1 VIEW COMPARISON:  01/03/2023 FINDINGS: Tracheostomy appropriately positioned. Breathing apparatus project over the left lung apex. Numerous leads and wires project over the chest. Normal heart size. No pleural effusion or pneumothorax. Similar minimal left base airspace disease. IMPRESSION: Similar minimal left base airspace disease, favoring atelectasis. Electronically Signed   By: Abigail Miyamoto M.D.   On: 01/12/2023 09:20     Marzetta Board, MD, PhD [facility]  Between 7 am - 7 pm I am available, please contact me via Amion (for emergencies) or Securechat (non urgent messages)  Between 7 pm - 7 am I am not available, please contact night coverage MD/APP via Amion

## 2023-01-13 DIAGNOSIS — I611 Nontraumatic intracerebral hemorrhage in hemisphere, cortical: Secondary | ICD-10-CM | POA: Diagnosis not present

## 2023-01-13 LAB — CBC
HCT: 34.5 % — ABNORMAL LOW (ref 36.0–46.0)
Hemoglobin: 10.4 g/dL — ABNORMAL LOW (ref 12.0–15.0)
MCH: 29.8 pg (ref 26.0–34.0)
MCHC: 30.1 g/dL (ref 30.0–36.0)
MCV: 98.9 fL (ref 80.0–100.0)
Platelets: 309 10*3/uL (ref 150–400)
RBC: 3.49 MIL/uL — ABNORMAL LOW (ref 3.87–5.11)
RDW: 15 % (ref 11.5–15.5)
WBC: 7.8 10*3/uL (ref 4.0–10.5)
nRBC: 0 % (ref 0.0–0.2)

## 2023-01-13 LAB — COMPREHENSIVE METABOLIC PANEL
ALT: 39 U/L (ref 0–44)
AST: 48 U/L — ABNORMAL HIGH (ref 15–41)
Albumin: 2.2 g/dL — ABNORMAL LOW (ref 3.5–5.0)
Alkaline Phosphatase: 151 U/L — ABNORMAL HIGH (ref 38–126)
Anion gap: 6 (ref 5–15)
BUN: 36 mg/dL — ABNORMAL HIGH (ref 8–23)
CO2: 27 mmol/L (ref 22–32)
Calcium: 8.2 mg/dL — ABNORMAL LOW (ref 8.9–10.3)
Chloride: 106 mmol/L (ref 98–111)
Creatinine, Ser: 0.47 mg/dL (ref 0.44–1.00)
GFR, Estimated: >60 mL/min (ref >60)
Glucose, Bld: 129 mg/dL — ABNORMAL HIGH (ref 70–99)
Potassium: 4.2 mmol/L (ref 3.5–5.1)
Sodium: 139 mmol/L (ref 135–145)
Total Bilirubin: 0.1 mg/dL — ABNORMAL LOW (ref 0.3–1.2)
Total Protein: 6.3 g/dL — ABNORMAL LOW (ref 6.5–8.1)

## 2023-01-13 LAB — GLUCOSE, CAPILLARY
Glucose-Capillary: 120 mg/dL — ABNORMAL HIGH (ref 70–99)
Glucose-Capillary: 110 mg/dL — ABNORMAL HIGH (ref 70–99)
Glucose-Capillary: 126 mg/dL — ABNORMAL HIGH (ref 70–99)
Glucose-Capillary: 137 mg/dL — ABNORMAL HIGH (ref 70–99)
Glucose-Capillary: 151 mg/dL — ABNORMAL HIGH (ref 70–99)
Glucose-Capillary: 118 mg/dL — ABNORMAL HIGH (ref 70–99)

## 2023-01-13 LAB — MAGNESIUM: Magnesium: 2.3 mg/dL (ref 1.7–2.4)

## 2023-01-13 NOTE — Progress Notes (Signed)
PROGRESS NOTE  Pamela Johnston SNK:[id number] DOB: 04-21-1941 DOA: 11/29/2022 PCP: [name], MD   LOS: 45 days   Brief Narrative / Interim history: 82 year old female with history of hypertension who comes into the hospital and is admitted on 11/29/2022 due to decreased responsiveness, aphasia, right-sided weakness.  CT head on admission showed large IPH of left parietal/left temporal regions with extensions into basal ganglia, secondary dissection with IVH and surrounding vasogenic edema with regional mass effect.  She also had several episodes of vomiting with concern for aspiration.  Neurosurgery, neurology were consulted, she was intubated and admitted to neuro ICU.  She was started on 3% hypertonic saline and palliative care was consulted due to poor prognosis.  She eventually underwent tracheostomy as well as PEG placement, and was also started on Keppra due to concern for seizures.  She is currently on trach collar.  Multiple palliative care discussions have been ongoing with the family, at 1 point hospital leadership was involved, she remains full code/full scope.  Earlier she could be placed is a month out from her tracheostomy placement, 2/12.  Significant events: 12/21 - Presented to [facility] ED via EMS as Code Stroke. LKW 2000. Abrupt onset of decreased responsiveness, aphasia and R hemiplegia. Stroke admitting, NSGY consulted. PCCM consult for vent management. Treated with unasyn for aspiration pneumonia. CT Head with large L hemispheric parietal/temporal lobe IPH with extension into basal ganglia and IVH. HTS 3% started.   12/24 - Palliative care consulted, family requested meeting to be delayed 12/25 - Family requested goals of care meeting to be delayed 12/26 - Stopped Unasyn, replete phos; [name] GOC conversation: family says full code 12/27 - Restarted Unasyn for fever, RLL infiltrate; resp culture: enterobacter, MDR. Palliative consulted  12/28 - CT Head unchanged 12/29 - Unasyn  changed to bactrim 12/31 - Posturing, CT head with increased shift, increased bleeding 1/2 - Family fired CVA service after discussion RE poor prognosis  1/4 - Off sedation  1/5 - No acute issues overnight. Remains on vent with increased respirations off sedation. Fever curve slightly decreased  1/6 - Pall care meeting moved to 2pm today. [name] the son-in-law will be present. On ven 30%/ On TF. Neo not started last night. MAP > 65 after fluids bolus. Febrile +.  Last CT head 12/09/22.  1/8 - No significant neurologic/clinical change. Brief GOC discussion with husband, [name], at bedside in AM. Concerns from staff re: patient GOC/decisionmaking, want to ensure husband is included in decisions/information sharing. Attempted bedside meeting but family prematurely ended meeting due to escalated discussion. 1/9 - Febrile overnight to 101.64F, remains on bromocriptine. No significant neurologic exam changes. 1/10 Family meeting yesterday with [name], plan for trach/peg though husband says they are still discussing this morning. Per Dt. Hulen Skains " So moving forward, Pamela Johnston is the designated medical decision maker for Pamela Johnston " 1/11 no new issues, plan for PEG tomorrow 1/12 for trach/peg today 1/15 No change in Neuro status , trach and PEG, No weaning , will repeat CT head without, get Case management involved in looking for LTAC ( Day 3 post trach on 1/12)   1/15-CT with some stabilization 1/19 No acute events overnight, respiratory culture 1/15 with abundant Pseudomonas, abundant Staphylococcus aureus, and moderate Enterobacter remains on Meropenem  1/22 trach collar trial 12 + hrs 1/24 trach collar x 24 hours on 35% 1/25- concern for seizures-- keppra started after EEG done  Subjective / 24h Interval events: Unresponsive  Assesement and Plan: Principal Problem:  ICH (intracerebral hemorrhage) (HCC) Active Problems:   RLS (restless legs syndrome)   Osteopenia   Sacroiliitis (HCC)    Benign essential HTN   Aspiration into airway   Hypoxia   Intraparenchymal hemorrhage of brain (HCC)   Brain compression (HCC)   Acute respiratory failure with hypoxia (HCC)   Pneumonia due to Enterobacter aerogenes (HCC)   Goals of care, counseling/discussion   Pressure injury of skin   Tracheostomy status ([place])   Principal problem 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/mass effect Brain compression/ICH Right parietal lobe hemorrhage with associated edema  Severe encephalopathy Intraventricular hemorrhage and mass effect, vasogenic edema, subfalcine herniation -Per prior team members, she is a very poor prognosis.  Multiple family meetings concluded over time with CMO on board, medical decision maker is Pamela Johnston patient's son-in-law. Stroke team signed off. Maintain neuro protective measures. Nutrition and bowel regimen. Aspiration precautions   Active problems Fevers -patient had an episode of fever of one 1.7 on 2/1 evening, and has been having low-grade temp of 100.3 2/3.  Continue to closely monitor, chest x-ray showed minimal atelectasis but no clear-cut infiltrates.  Prior respiratory cultures showing Pseudomonas, most recent on 01/03/2023 showing Carbapenem resistance.  She has been treated for pneumonia in the past, monitor off antibiotics, but if needed could use Zerbaxa per ID given resistance.  D-dimer is elevated, but lower extremity ultrasound is negative for DVT.  PE is in the differential however she is not having worsening respiratory status, may not be stable to transfer down for CT angiogram, and also she could not be anticoagulated due to devastating hemorrhagic stroke  Seizures - On Keppra   Acute respiratory failure with hypoxia due to large intracerebral hemorrhage - S/p trach/PEG 1/12. Previously completed 10 days of antibiotic coverage for anterobasilar pneumonia, repeat sputum culture obtained 1/15 positive for  abundant Pseudomonas, abundant Staphylococcus aureus, and moderate Enterobacter, s/p course of Meropenem -Continue ATC as tolerated, hope to avoid vent needs -Wean O2 down as tolerated -Bronchial hygiene -culture still with pseudomonas-- ? Colonized now-- will hold on abx for now as no fever   Urinary retention -foley placed 1/23, Will need urologic follow up as outpatient   History of chronic sacroiliitis Osteopenia RLS -Supportive care  Scheduled Meds:  Chlorhexidine Gluconate Cloth  6 each Topical Daily   famotidine  20 mg Per Tube QHS   feeding supplement (PROSource TF20)  60 mL Per Tube Daily   Gerhardt's butt cream   Topical QID   latanoprost  1 drop Both Eyes QHS   levETIRAcetam  500 mg Per Tube BID   metoprolol tartrate  12.5 mg Per Tube BID   nutrition supplement (JUVEN)  1 packet Per Tube BID BM   mouth rinse  15 mL Mouth Rinse Q2H   rosuvastatin  20 mg Per Tube Daily   Continuous Infusions:  sodium chloride Stopped (01/01/23 0424)   feeding supplement (JEVITY 1.2 CAL) 1,000 mL (01/11/23 1514)   PRN Meds:.acetaminophen **OR** acetaminophen (TYLENOL) oral liquid 160 mg/5 mL **OR** acetaminophen, bisacodyl, docusate, morphine injection, mouth rinse, phenylephrine-shark liver oil-mineral oil-petrolatum, polyethylene glycol  Current Outpatient Medications  Medication Instructions   latanoprost (XALATAN) 0.005 % ophthalmic solution 1 drop, Both Eyes, Daily at bedtime   oxyCODONE-acetaminophen (PERCOCET/ROXICET) 5-325 MG tablet 1 tablet, Oral, Every 4 hours PRN   pramipexole (MIRAPEX) 0.25 mg, Oral, Nightly    Diet Orders (From admission, onward)     Start     Ordered   12/19/22  0001  Diet NPO time specified  Diet effective midnight        12/18/22 1632            DVT prophylaxis: SCD's Start: 11/29/22 2215   Lab Results  Component Value Date   PLT 309 01/13/2023      Code Status: Full Code  Family Communication: No family at bedside  Status is:  Inpatient  Remains inpatient appropriate because: Severity of illness  Level of care: Progressive  Consultants:  Neurology Neurosurgery PCCM Palliative care  Objective: Vitals:   01/13/23 0340 01/13/23 0458 01/13/23 0717 01/13/23 0813  BP:    (!) 124/59  Pulse:   (!) 107 (!) 109  Resp:    (!) 28  Temp: 97.7 F (36.5 C)  99.4 F (37.4 C)   TempSrc: Axillary  Axillary   SpO2:   99% 98%  Weight:  62.8 kg    Height:        Intake/Output Summary (Last 24 hours) at 01/13/2023 0912 Last data filed at 01/13/2023 0500 Gross per 24 hour  Intake 610 ml  Output 1025 ml  Net -415 ml    Wt Readings from Last 3 Encounters:  01/13/23 62.8 kg    Examination:  Constitutional: NAD, unresponsive Eyes: lids and conjunctivae normal, no scleral icterus ENMT: mmm Neck: normal, supple Respiratory: clear to auscultation bilaterally, no wheezing, no crackles. Normal respiratory effort.  Cardiovascular: Regular rate and rhythm, no murmurs / rubs / gallops. No LE edema. Abdomen: soft, no distention, no tenderness. Bowel sounds positive.  Skin: no rashes  Data Reviewed: I have independently reviewed following labs and imaging studies   CBC Recent Labs  Lab 01/11/23 0349 01/13/23 0609  WBC 7.7 7.8  HGB 9.9* 10.4*  HCT 32.1* 34.5*  PLT 391 309  MCV 98.5 98.9  MCH 30.4 29.8  MCHC 30.8 30.1  RDW 15.1 15.0  LYMPHSABS 2.2  --   MONOABS 0.8  --   EOSABS 0.3  --   BASOSABS 0.1  --      Recent Labs  Lab 01/11/23 0349 01/12/23 1043 01/13/23 0609  NA 139  --  139  K 3.9  --  4.2  CL 106  --  106  CO2 26  --  27  GLUCOSE 130*  --  129*  BUN 31*  --  36*  CREATININE 0.41*  --  0.47  CALCIUM 8.2*  --  8.2*  AST  --   --  48*  ALT  --   --  39  ALKPHOS  --   --  151*  BILITOT  --   --  0.1*  ALBUMIN  --   --  2.2*  MG  --   --  2.3  DDIMER  --  7.95*  --       ------------------------------------------------------------------------------------------------------------------ No results for input(s): "CHOL", "HDL", "LDLCALC", "TRIG", "CHOLHDL", "LDLDIRECT" in the last 72 hours.  Lab Results  Component Value Date   HGBA1C 5.5 12/01/2022   ------------------------------------------------------------------------------------------------------------------ No results for input(s): "TSH", "T4TOTAL", "T3FREE", "THYROIDAB" in the last 72 hours.  Invalid input(s): "FREET3"  Cardiac Enzymes No results for input(s): "CKMB", "TROPONINI", "MYOGLOBIN" in the last 168 hours.  Invalid input(s): "CK" ------------------------------------------------------------------------------------------------------------------ No results found for: "BNP"  CBG: Recent Labs  Lab 01/12/23 1529 01/12/23 2003 01/12/23 2331 01/13/23 0342 01/13/23 0736  GLUCAP 117* 153* 128* 120* 110*     Recent Results (from the past 240 hour(s))  Culture, Respiratory w Gram Stain  Status: None   Collection Time: 01/03/23  3:47 PM   Specimen: Tracheal Aspirate; Respiratory  Result Value Ref Range Status   Specimen Description TRACHEAL ASPIRATE  Final   Special Requests NONE  Final   Gram Stain   Final    ABUNDANT SQUAMOUS EPITHELIAL CELLS PRESENT ABUNDANT WBC PRESENT, PREDOMINANTLY PMN ABUNDANT GRAM NEGATIVE RODS    Culture   Final    ABUNDANT PSEUDOMONAS AERUGINOSA MULTI-DRUG RESISTANT ORGANISM CRITICAL RESULT CALLED TO, READ BACK BY AND VERIFIED WITH: PHARMD J MILLEN [id number] AT 1011 BY CM Performed at [facility], [address], [place], [place] [address]    Report Status 01/06/2023 FINAL  Final   Organism ID, Bacteria PSEUDOMONAS AERUGINOSA  Final      Susceptibility   Pseudomonas aeruginosa - MIC*    CEFTAZIDIME 16 INTERMEDIATE Intermediate     CIPROFLOXACIN 1 INTERMEDIATE Intermediate     GENTAMICIN <=1 SENSITIVE Sensitive     IMIPENEM >=16 RESISTANT  Resistant     * ABUNDANT PSEUDOMONAS AERUGINOSA  Respiratory (~20 pathogens) panel by PCR     Status: None   Collection Time: 01/05/23  9:56 AM   Specimen: Nasopharyngeal Swab; Respiratory  Result Value Ref Range Status   Adenovirus NOT DETECTED NOT DETECTED Final   Coronavirus 229E NOT DETECTED NOT DETECTED Final    Comment: (NOTE) The Coronavirus on the Respiratory Panel, DOES NOT test for the novel  Coronavirus (2019 nCoV)    Coronavirus HKU1 NOT DETECTED NOT DETECTED Final   Coronavirus NL63 NOT DETECTED NOT DETECTED Final   Coronavirus OC43 NOT DETECTED NOT DETECTED Final   Metapneumovirus NOT DETECTED NOT DETECTED Final   Rhinovirus / Enterovirus NOT DETECTED NOT DETECTED Final   Influenza A NOT DETECTED NOT DETECTED Final   Influenza B NOT DETECTED NOT DETECTED Final   Parainfluenza Virus 1 NOT DETECTED NOT DETECTED Final   Parainfluenza Virus 2 NOT DETECTED NOT DETECTED Final   Parainfluenza Virus 3 NOT DETECTED NOT DETECTED Final   Parainfluenza Virus 4 NOT DETECTED NOT DETECTED Final   Respiratory Syncytial Virus NOT DETECTED NOT DETECTED Final   Bordetella pertussis NOT DETECTED NOT DETECTED Final   Bordetella Parapertussis NOT DETECTED NOT DETECTED Final   Chlamydophila pneumoniae NOT DETECTED NOT DETECTED Final   Mycoplasma pneumoniae NOT DETECTED NOT DETECTED Final    Comment: Performed at [facility], [address], [place], [place] [address]     Radiology Studies: VAS [place] LOWER EXTREMITY VENOUS (DVT)  Result Date: 01/12/2023  Lower Venous DVT Study Patient Name:  QUILLA FREEZE  Date of Exam:   01/12/2023 Medical Rec #: [id number]      Accession #:    [id number] Date of Birth: 02-22-1941       Patient Gender: F Patient Age:   74 years Exam Location:  [facility] Procedure:      VAS [place] LOWER EXTREMITY VENOUS (DVT) Referring Phys: Pamella Pert --------------------------------------------------------------------------------  Risk Factors:  Immobility/ventilation. Limitations: Body habitus and patient positioning, ventilation. Comparison Study: Prior negative bilateral LEV done 12/21/22 Performing Technologist: Sherren Kerns RVS  Examination Guidelines: A complete evaluation includes B-mode imaging, spectral Doppler, color Doppler, and power Doppler as needed of all accessible portions of each vessel. Bilateral testing is considered an integral part of a complete examination. Limited examinations for reoccurring indications may be performed as noted. The reflux portion of the exam is performed with the patient in reverse Trendelenburg.  +---------+---------------+---------+-----------+----------+-------------------+ RIGHT    CompressibilityPhasicitySpontaneityPropertiesThrombus Aging      +---------+---------------+---------+-----------+----------+-------------------+  CFV      Full           Yes      Yes                                      +---------+---------------+---------+-----------+----------+-------------------+ SFJ      Full                                                             +---------+---------------+---------+-----------+----------+-------------------+ FV Prox  Full                                                             +---------+---------------+---------+-----------+----------+-------------------+ FV Mid   Full                                                             +---------+---------------+---------+-----------+----------+-------------------+ FV DistalFull                                                             +---------+---------------+---------+-----------+----------+-------------------+ PFV      Full                                                             +---------+---------------+---------+-----------+----------+-------------------+ POP                     Yes      Yes                  patent by color and                                                        Doppler             +---------+---------------+---------+-----------+----------+-------------------+ PTV                                                   patent by color     +---------+---------------+---------+-----------+----------+-------------------+ PERO  Not well visualized +---------+---------------+---------+-----------+----------+-------------------+   +---------+---------------+---------+-----------+----------+-------------------+ LEFT     CompressibilityPhasicitySpontaneityPropertiesThrombus Aging      +---------+---------------+---------+-----------+----------+-------------------+ CFV      Full                                         patient shaking,                                                          unable to get                                                             adequate waveform   +---------+---------------+---------+-----------+----------+-------------------+ SFJ      Full                                                             +---------+---------------+---------+-----------+----------+-------------------+ FV Prox  Full                                                             +---------+---------------+---------+-----------+----------+-------------------+ FV Mid   Full                                                             +---------+---------------+---------+-----------+----------+-------------------+ FV DistalFull                                                             +---------+---------------+---------+-----------+----------+-------------------+ PFV      Full                                                             +---------+---------------+---------+-----------+----------+-------------------+ POP                                                   Not well visualized  +---------+---------------+---------+-----------+----------+-------------------+ PTV  Not well visualized +---------+---------------+---------+-----------+----------+-------------------+ PERO                                                  Not well visualized +---------+---------------+---------+-----------+----------+-------------------+     Summary: RIGHT: - There is no evidence of deep vein thrombosis in the lower extremity. However, portions of this examination were limited- see technologist comments above.  LEFT: - There is no evidence of deep vein thrombosis in the lower extremity. However, portions of this examination were limited- see technologist comments above.  *See table(s) above for measurements and observations.    Preliminary      Marzetta Board, MD, PhD [facility]  Between 7 am - 7 pm I am available, please contact me via Amion (for emergencies) or Securechat (non urgent messages)  Between 7 pm - 7 am I am not available, please contact night coverage MD/APP via Amion

## 2023-01-14 DIAGNOSIS — I10 Essential (primary) hypertension: Secondary | ICD-10-CM | POA: Diagnosis not present

## 2023-01-14 DIAGNOSIS — T17908A Unspecified foreign body in respiratory tract, part unspecified causing other injury, initial encounter: Secondary | ICD-10-CM | POA: Diagnosis not present

## 2023-01-14 DIAGNOSIS — I611 Nontraumatic intracerebral hemorrhage in hemisphere, cortical: Secondary | ICD-10-CM | POA: Diagnosis not present

## 2023-01-14 DIAGNOSIS — J9601 Acute respiratory failure with hypoxia: Secondary | ICD-10-CM | POA: Diagnosis not present

## 2023-01-14 LAB — GLUCOSE, CAPILLARY
Glucose-Capillary: 134 mg/dL — ABNORMAL HIGH (ref 70–99)
Glucose-Capillary: 122 mg/dL — ABNORMAL HIGH (ref 70–99)
Glucose-Capillary: 123 mg/dL — ABNORMAL HIGH (ref 70–99)
Glucose-Capillary: 126 mg/dL — ABNORMAL HIGH (ref 70–99)
Glucose-Capillary: 131 mg/dL — ABNORMAL HIGH (ref 70–99)
Glucose-Capillary: 149 mg/dL — ABNORMAL HIGH (ref 70–99)

## 2023-01-14 NOTE — Progress Notes (Signed)
PROGRESS NOTE  Pamela Johnston EXB:[id number] DOB: Jan 10, 1941 DOA: 11/29/2022 PCP: Pamela Aly, MD   LOS: 93 days   Brief Narrative / Interim history: 82 year old female with history of hypertension who comes into the hospital and is admitted on 11/29/2022 due to decreased responsiveness, aphasia, right-sided weakness.  CT head on admission showed large IPH of left parietal/left temporal regions with extensions into basal ganglia, secondary dissection with IVH and surrounding vasogenic edema with regional mass effect.  She also had several episodes of vomiting with concern for aspiration.  Neurosurgery, neurology were consulted, she was intubated and admitted to neuro ICU.  She was started on 3% hypertonic saline and palliative care was consulted due to poor prognosis.  She eventually underwent tracheostomy as well as PEG placement, and was also started on Keppra due to concern for seizures.  She is currently on trach collar.  Multiple palliative care discussions have been ongoing with the family, at 1 point hospital leadership was involved, she remains full code/full scope.  Earlier she could be placed is a month out from her tracheostomy placement, 2/12.  Significant events: 12/21 - Presented to [facility] ED via EMS as Code Stroke. LKW 2000. Abrupt onset of decreased responsiveness, aphasia and R hemiplegia. Stroke admitting, NSGY consulted. PCCM consult for vent management. Treated with unasyn for aspiration pneumonia. CT Head with large L hemispheric parietal/temporal lobe IPH with extension into basal ganglia and IVH. HTS 3% started.   12/24 - Palliative care consulted, family requested meeting to be delayed 12/25 - Family requested goals of care meeting to be delayed 12/26 - Stopped Unasyn, replete phos; Pamela Johnston GOC conversation: family says full code 12/27 - Restarted Unasyn for fever, RLL infiltrate; resp culture: enterobacter, MDR. Palliative consulted  12/28 - CT Head unchanged 12/29 - Unasyn  changed to bactrim 12/31 - Posturing, CT head with increased shift, increased bleeding 1/2 - Family fired CVA service after discussion RE poor prognosis  1/4 - Off sedation  1/5 - No acute issues overnight. Remains on vent with increased respirations off sedation. Fever curve slightly decreased  1/6 - Pall care meeting moved to 2pm today. Pamela Johnston the son-in-law will be present. On ven 30%/ On TF. Neo not started last night. MAP > 65 after fluids bolus. Febrile +.  Last CT head 12/09/22.  1/8 - No significant neurologic/clinical change. Brief GOC discussion with husband, Pamela Johnston, at bedside in AM. Concerns from staff re: patient GOC/decisionmaking, want to ensure husband is included in decisions/information sharing. Attempted bedside meeting but family prematurely ended meeting due to escalated discussion. 1/9 - Febrile overnight to 101.77F, remains on bromocriptine. No significant neurologic exam changes. 1/10 Family meeting yesterday with Dr. Hulen Johnston, plan for trach/peg though husband says they are still discussing this morning. Per Dt. Pamela Johnston " So moving forward, Pamela Johnston is the designated medical decision maker for Pamela Johnston " 1/11 no new issues, plan for PEG tomorrow 1/12 for trach/peg today 1/15 No change in Neuro status , trach and PEG, No weaning , will repeat CT head without, get Case management involved in looking for LTAC ( Day 3 post trach on 1/12)   1/15-CT with some stabilization 1/19 No acute events overnight, respiratory culture 1/15 with abundant Pseudomonas, abundant Staphylococcus aureus, and moderate Enterobacter remains on Meropenem  1/22 trach collar trial 12 + hrs 1/24 trach collar x 24 hours on 35% 1/25- concern for seizures-- keppra started after EEG done  Subjective / 24h Interval events: Remains unresponsive.  No overnight events.  Afebrile  Assesement and Plan: Principal Problem:   ICH (intracerebral hemorrhage) (HCC) Active Problems:   RLS (restless legs  syndrome)   Osteopenia   Sacroiliitis (HCC)   Benign essential HTN   Aspiration into airway   Hypoxia   Intraparenchymal hemorrhage of brain (HCC)   Brain compression (HCC)   Acute respiratory failure with hypoxia (HCC)   Pneumonia due to Enterobacter aerogenes (HCC)   Goals of care, counseling/discussion   Pressure injury of skin   Tracheostomy status ([place])   Principal problem 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/mass effect Brain compression/ICH Right parietal lobe hemorrhage with associated edema  Severe encephalopathy Intraventricular hemorrhage and mass effect, vasogenic edema, subfalcine herniation -Per prior team members, she is a very poor prognosis.  Multiple family meetings concluded over time with CMO on board, medical decision maker is Pamela Johnston patient's son-in-law. Stroke team signed off. Maintain neuro protective measures. Nutrition and bowel regimen. Aspiration precautions   Active problems Fevers -patient had an episode of fever of one 1.7 on 2/1 evening, and has been having low-grade temp of 100.3 2/3.  Continue to closely monitor, chest x-ray showed minimal atelectasis but no clear-cut infiltrates.  Prior respiratory cultures showing Pseudomonas, most recent on 01/03/2023 showing Carbapenem resistance.  She has been treated for pneumonia in the past, monitor off antibiotics, but if needed could use Zerbaxa per ID given resistance.  D-dimer is elevated, but lower extremity ultrasound is negative for DVT.  PE is in the differential however she is not having worsening respiratory status, may not be stable to transfer down for CT angiogram, and also she could not be anticoagulated due to her devastating hemorrhagic stroke  Seizures - On Keppra   Acute respiratory failure with hypoxia due to large intracerebral hemorrhage - S/p trach/PEG 1/12. Previously completed 10 days of antibiotic coverage for anterobasilar pneumonia,  repeat sputum culture obtained 1/15 positive for abundant Pseudomonas, abundant Staphylococcus aureus, and moderate Enterobacter, s/p course of Meropenem -Continue ATC as tolerated, hope to avoid vent needs -Wean O2 down as tolerated -Bronchial hygiene -culture still with pseudomonas-- ? Colonized now-- will hold on abx for now as no fever   Urinary retention -foley placed 1/23, Will need urologic follow up as outpatient   History of chronic sacroiliitis Osteopenia RLS -Supportive care  Scheduled Meds:  Chlorhexidine Gluconate Cloth  6 each Topical Daily   famotidine  20 mg Per Tube QHS   feeding supplement (PROSource TF20)  60 mL Per Tube Daily   Gerhardt's butt cream   Topical QID   latanoprost  1 drop Both Eyes QHS   levETIRAcetam  500 mg Per Tube BID   metoprolol tartrate  12.5 mg Per Tube BID   nutrition supplement (JUVEN)  1 packet Per Tube BID BM   mouth rinse  15 mL Mouth Rinse Q2H   rosuvastatin  20 mg Per Tube Daily   Continuous Infusions:  sodium chloride Stopped (01/01/23 0424)   feeding supplement (JEVITY 1.2 CAL) 55 mL/hr at 01/14/23 0800   PRN Meds:.acetaminophen **OR** acetaminophen (TYLENOL) oral liquid 160 mg/5 mL **OR** acetaminophen, bisacodyl, docusate, morphine injection, mouth rinse, phenylephrine-shark liver oil-mineral oil-petrolatum, polyethylene glycol  Current Outpatient Medications  Medication Instructions   latanoprost (XALATAN) 0.005 % ophthalmic solution 1 drop, Both Eyes, Daily at bedtime   oxyCODONE-acetaminophen (PERCOCET/ROXICET) 5-325 MG tablet 1 tablet, Oral, Every 4 hours PRN   pramipexole (MIRAPEX) 0.25 mg, Oral, Nightly    Diet Orders (From admission, onward)  Start     Ordered   12/19/22 0001  Diet NPO time specified  Diet effective midnight        12/18/22 1632            DVT prophylaxis: SCD's Start: 11/29/22 2215   Lab Results  Component Value Date   PLT 309 01/13/2023      Code Status: Full Code  Family  Communication: No family at bedside  Status is: Inpatient  Remains inpatient appropriate because: Severity of illness  Level of care: Progressive  Consultants:  Neurology Neurosurgery PCCM Palliative care  Objective: Vitals:   01/14/23 0300 01/14/23 0500 01/14/23 0749 01/14/23 0752  BP: (!) 123/58   (!) 150/82  Pulse: (!) 107  (!) 115 (!) 118  Resp: 20  (!) 26 (!) 27  Temp: 98.9 F (37.2 C)   98 F (36.7 C)  TempSrc: Axillary   Oral  SpO2: 95%  97% 97%  Weight:  62.8 kg    Height:        Intake/Output Summary (Last 24 hours) at 01/14/2023 0844 Last data filed at 01/14/2023 0800 Gross per 24 hour  Intake 1515 ml  Output 850 ml  Net 665 ml    Wt Readings from Last 3 Encounters:  01/14/23 62.8 kg    Examination:  Constitutional: NAD Eyes: lids and conjunctivae normal, no scleral icterus ENMT: mmm Neck: normal, supple Respiratory: clear to auscultation bilaterally, no wheezing, no crackles. Normal respiratory effort.  Cardiovascular: Regular rate and rhythm, no murmurs / rubs / gallops. No LE edema. Abdomen: soft, no distention, no tenderness. Bowel sounds positive.  Skin: no rashes  Data Reviewed: I have independently reviewed following labs and imaging studies   CBC Recent Labs  Lab 01/11/23 0349 01/13/23 0609  WBC 7.7 7.8  HGB 9.9* 10.4*  HCT 32.1* 34.5*  PLT 391 309  MCV 98.5 98.9  MCH 30.4 29.8  MCHC 30.8 30.1  RDW 15.1 15.0  LYMPHSABS 2.2  --   MONOABS 0.8  --   EOSABS 0.3  --   BASOSABS 0.1  --      Recent Labs  Lab 01/11/23 0349 01/12/23 1043 01/13/23 0609  NA 139  --  139  K 3.9  --  4.2  CL 106  --  106  CO2 26  --  27  GLUCOSE 130*  --  129*  BUN 31*  --  36*  CREATININE 0.41*  --  0.47  CALCIUM 8.2*  --  8.2*  AST  --   --  48*  ALT  --   --  39  ALKPHOS  --   --  151*  BILITOT  --   --  0.1*  ALBUMIN  --   --  2.2*  MG  --   --  2.3  DDIMER  --  7.95*  --       ------------------------------------------------------------------------------------------------------------------ No results for input(s): "CHOL", "HDL", "LDLCALC", "TRIG", "CHOLHDL", "LDLDIRECT" in the last 72 hours.  Lab Results  Component Value Date   HGBA1C 5.5 12/01/2022   ------------------------------------------------------------------------------------------------------------------ No results for input(s): "TSH", "T4TOTAL", "T3FREE", "THYROIDAB" in the last 72 hours.  Invalid input(s): "FREET3"  Cardiac Enzymes No results for input(s): "CKMB", "TROPONINI", "MYOGLOBIN" in the last 168 hours.  Invalid input(s): "CK" ------------------------------------------------------------------------------------------------------------------ No results found for: "BNP"  CBG: Recent Labs  Lab 01/13/23 1608 01/13/23 1948 01/13/23 2304 01/14/23 0336 01/14/23 0823  GLUCAP 137* 151* 118* 134* 122*     Recent Results (  from the past 240 hour(s))  Respiratory (~20 pathogens) panel by PCR     Status: None   Collection Time: 01/05/23  9:56 AM   Specimen: Nasopharyngeal Swab; Respiratory  Result Value Ref Range Status   Adenovirus NOT DETECTED NOT DETECTED Final   Coronavirus 229E NOT DETECTED NOT DETECTED Final    Comment: (NOTE) The Coronavirus on the Respiratory Panel, DOES NOT test for the novel  Coronavirus (2019 nCoV)    Coronavirus HKU1 NOT DETECTED NOT DETECTED Final   Coronavirus NL63 NOT DETECTED NOT DETECTED Final   Coronavirus OC43 NOT DETECTED NOT DETECTED Final   Metapneumovirus NOT DETECTED NOT DETECTED Final   Rhinovirus / Enterovirus NOT DETECTED NOT DETECTED Final   Influenza A NOT DETECTED NOT DETECTED Final   Influenza B NOT DETECTED NOT DETECTED Final   Parainfluenza Virus 1 NOT DETECTED NOT DETECTED Final   Parainfluenza Virus 2 NOT DETECTED NOT DETECTED Final   Parainfluenza Virus 3 NOT DETECTED NOT DETECTED Final   Parainfluenza Virus 4 NOT DETECTED  NOT DETECTED Final   Respiratory Syncytial Virus NOT DETECTED NOT DETECTED Final   Bordetella pertussis NOT DETECTED NOT DETECTED Final   Bordetella Parapertussis NOT DETECTED NOT DETECTED Final   Chlamydophila pneumoniae NOT DETECTED NOT DETECTED Final   Mycoplasma pneumoniae NOT DETECTED NOT DETECTED Final    Comment: Performed at [facility], [place]. [address] [place], [place] [address]     Radiology Studies: No results found.   Marzetta Board, MD, PhD [facility]  Between 7 am - 7 pm I am available, please contact me via Amion (for emergencies) or Securechat (non urgent messages)  Between 7 pm - 7 am I am not available, please contact night coverage MD/APP via Amion

## 2023-01-14 NOTE — Progress Notes (Signed)
NAME:  Pamela Johnston, MRN:  [id number], DOB:  [birth year], LOS: 12 ADMISSION DATE:  11/29/2022, CONSULTATION DATE:  11/29/2022 REFERRING MD:  Leonel Ramsay - Neuro REASON FOR CONSULT: Ventilator management in setting of large IPH  History of Present Illness:  82-yer-old woman who presented to [facility] ED 12/21 as a Code Stroke. LKW 2000 when patient reportedly was in bed and became less responsive with +aphasia and R-sided weakness. One episode of nausea/vomiting en route with EMS. PMHx significant for HTN, chronic sacroiliitis (managed with Percocet), RLS, osteopenia.   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 Labs were grossly unremarkable with WBC mildly elevated to 12 and mild hyperglycemia to 154. Decision was made to intubate patient in ED after several episodes of emesis with concern for aspiration. HTS 3% was initiated.   Stroke service to admit. NSGY consulted with no plan for intervention at this time.   PCCM consulted for ventilator/hemodynamic management.   Pertinent Medical History:  Hypertension Sacroilititis Restless legs syndrome Osteopenia  Significant Hospital Events: Including procedures, antibiotic start and stop dates in addition to other pertinent events   12/21 - Presented to [facility] ED via EMS as Code Stroke. LKW 2000. Abrupt onset of decreased responsiveness, aphasia and R hemiplegia. Stroke admitting, NSGY consulted. PCCM consult for vent management. Treated with unasyn for aspiration pneumonia. CT Head with large L hemispheric parietal/temporal lobe IPH with extension into basal ganglia and IVH. HTS 3% started.   12/24 - Palliative care consulted, family requested meeting to be delayed 12/25 - Family requested goals of care meeting to be delayed 12/26 - Stopped  Unasyn, replete phos; [name] GOC conversation: family says full code 12/27 - Restarted Unasyn for fever, RLL infiltrate; resp culture: enterobacter, MDR. Palliative consulted  12/28 - CT Head unchanged 12/29 - Unasyn changed to bactrim 12/31 - Posturing, CT head with increased shift, increased bleeding 1/2 - Family fired CVA service after discussion RE poor prognosis  1/4 - Off sedation  1/5 - No acute issues overnight. Remains on vent with increased respirations off sedation. Fever curve slightly decreased  1/6 - Pall care meeting moved to 2pm today. [name] the son-in-law will be present. On ven 30%/ On TF. Neo not started last night. MAP > 65 after fluids bolus. Febrile +.  Last CT head 12/09/22.  1/8 - No significant neurologic/clinical change. Brief GOC discussion with husband, Pamela Johnston, at bedside in AM. Concerns from staff re: patient GOC/decisionmaking, want to ensure husband is included in decisions/information sharing. Attempted bedside meeting but family prematurely ended meeting due to escalated discussion. 1/9 - Febrile overnight to 101.48F, remains on bromocriptine. No significant neurologic exam changes. 1/10 Family meeting yesterday with [name], plan for trach/peg though husband says they are still discussing this morning. Per Dt. Hulen Skains " So moving forward, Pamela Johnston is the designated medical decision maker for Pamela Johnston " 1/11 no new issues, plan for PEG tomorrow 1/12 for trach/peg today 1/15 No change in Neuro status , trach and PEG, No weaning , will repeat CT head without, get Case management involved in looking for LTAC ( Day 3 post trach on 1/12)   1/15-CT with some stabilization 1/19 No acute events overnight, respiratory culture 1/15 with abundant Pseudomonas, abundant Staphylococcus aureus, and moderate Enterobacter remains on Meropenem  1/22 trach collar trial 12 +  hrs 1/24 trach collar x 24 hours on 35% 1/29 order placed to change trach to cuffless   Interim  History / Subjective:   Appears comfortable, husband at bedside  Objective:   Blood pressure (!) 150/82, pulse (!) 118, temperature 98 F (36.7 C), temperature source Oral, resp. rate (!) 27, height 5\' 1"  (1.549 m), weight 62.8 kg, SpO2 97 %.    FiO2 (%):  [21 %-28 %] 21 %   Intake/Output Summary (Last 24 hours) at 01/14/2023 1045 Last data filed at 01/14/2023 0800 Gross per 24 hour  Intake 1485 ml  Output 550 ml  Net 935 ml   Filed Weights   01/12/23 0500 01/13/23 0458 01/14/23 0500  Weight: 59.7 kg 62.8 kg 62.8 kg   Physical Exam: General comatose 82 year old female who remains tracheostomy dependent HEENT normocephalic atraumatic tracheostomy is midline , secure and intact, still has some minimal yellow-tinged secretions Pulmonary: Bilateral chest excursion, Scattered rhonchi, RR 23, secretion burden Cardiac: Regular rate and rhythm, ST per tele Abdomen: Soft nontender no organomegaly, BS +, Tolerating Tube Feeds, Body mass index is 26.16 kg/m.  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  She does not need a cuffed tracheostomy.  Plan Continue routine trach care Change to 6 cuffless trach 01/07/2023 We will continue to see weekly while she is in the hospital Diuresis prn per Primary Team  Best Practice (right click and "Reselect all SmartList Selections" daily)  Per primary   Critical care: N/A   Magdalen Spatz, MSN, AGACNP-BC [place] for personal pager PCCM on call pager [phone]  01/14/2023 10:53 AM

## 2023-01-15 DIAGNOSIS — I611 Nontraumatic intracerebral hemorrhage in hemisphere, cortical: Secondary | ICD-10-CM | POA: Diagnosis not present

## 2023-01-15 DIAGNOSIS — J9601 Acute respiratory failure with hypoxia: Secondary | ICD-10-CM | POA: Diagnosis not present

## 2023-01-15 DIAGNOSIS — I10 Essential (primary) hypertension: Secondary | ICD-10-CM | POA: Diagnosis not present

## 2023-01-15 LAB — COMPREHENSIVE METABOLIC PANEL
ALT: 38 U/L (ref 0–44)
AST: 42 U/L — ABNORMAL HIGH (ref 15–41)
Albumin: 2.1 g/dL — ABNORMAL LOW (ref 3.5–5.0)
Alkaline Phosphatase: 137 U/L — ABNORMAL HIGH (ref 38–126)
Anion gap: 9 (ref 5–15)
BUN: 36 mg/dL — ABNORMAL HIGH (ref 8–23)
CO2: 25 mmol/L (ref 22–32)
Calcium: 8.4 mg/dL — ABNORMAL LOW (ref 8.9–10.3)
Chloride: 105 mmol/L (ref 98–111)
Creatinine, Ser: 0.53 mg/dL (ref 0.44–1.00)
GFR, Estimated: >60 mL/min (ref >60)
Glucose, Bld: 140 mg/dL — ABNORMAL HIGH (ref 70–99)
Potassium: 3.9 mmol/L (ref 3.5–5.1)
Sodium: 139 mmol/L (ref 135–145)
Total Bilirubin: 0.3 mg/dL (ref 0.3–1.2)
Total Protein: 6.1 g/dL — ABNORMAL LOW (ref 6.5–8.1)

## 2023-01-15 LAB — GLUCOSE, CAPILLARY
Glucose-Capillary: 144 mg/dL — ABNORMAL HIGH (ref 70–99)
Glucose-Capillary: 151 mg/dL — ABNORMAL HIGH (ref 70–99)
Glucose-Capillary: 153 mg/dL — ABNORMAL HIGH (ref 70–99)
Glucose-Capillary: 138 mg/dL — ABNORMAL HIGH (ref 70–99)
Glucose-Capillary: 118 mg/dL — ABNORMAL HIGH (ref 70–99)
Glucose-Capillary: 142 mg/dL — ABNORMAL HIGH (ref 70–99)

## 2023-01-15 LAB — CBC
HCT: 32.5 % — ABNORMAL LOW (ref 36.0–46.0)
Hemoglobin: 10.3 g/dL — ABNORMAL LOW (ref 12.0–15.0)
MCH: 30.7 pg (ref 26.0–34.0)
MCHC: 31.7 g/dL (ref 30.0–36.0)
MCV: 97 fL (ref 80.0–100.0)
Platelets: 268 10*3/uL (ref 150–400)
RBC: 3.35 MIL/uL — ABNORMAL LOW (ref 3.87–5.11)
RDW: 15.2 % (ref 11.5–15.5)
WBC: 9.3 10*3/uL (ref 4.0–10.5)
nRBC: 0 % (ref 0.0–0.2)

## 2023-01-15 LAB — MAGNESIUM: Magnesium: 2.1 mg/dL (ref 1.7–2.4)

## 2023-01-15 NOTE — Progress Notes (Signed)
Physical Therapy Treatment Patient Details Name: Pamela Johnston MRN: [id number] DOB: June 16, 1941 Today's Date: 01/15/2023   History of Present Illness 72-yer-old woman who presented to [facility] ED 12/21 as a Code Stroke with +aphasia and R-sided weakness. 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 Pt underwent trach and peg on 12/19/22. PMHx significant for HTN, chronic sacroiliitis (managed with Percocet), RLS, osteopenia.    PT Comments    Pt remains status quo from mobility stand point. Pt requiring totalAx2 for all mobility with no command follow or eye opening t/o session. VSS does remain stable with change in position. Continue to trial 1x/wk per family request.     Recommendations for follow up therapy are one component of a multi-disciplinary discharge planning process, led by the attending physician.  Recommendations may be updated based on patient status, additional functional criteria and insurance authorization.  Follow Up Recommendations  Long-term institutional care without follow-up therapy Can patient physically be transported by private vehicle: No   Assistance Recommended at Discharge Frequent or constant Supervision/Assistance  Patient can return home with the following  (unable to return home safely at this time)   Equipment Recommendations  None recommended by PT    Recommendations for Other Services       Precautions / Restrictions Precautions Precautions: Fall Precaution Comments: trach, posturing Restrictions Weight Bearing Restrictions: No     Mobility  Bed Mobility Overal bed mobility: Needs Assistance Bed Mobility: Supine to Sit     Supine to sit: Total assist (used egress in bed)     General bed mobility comments: pt with no active movement or participation. used chair position in bed, VSS    Transfers                    General transfer comment: unable    Ambulation/Gait               General Gait Details: unable   Stairs             Wheelchair Mobility    Modified Rankin (Stroke Patients Only) Modified Rankin (Stroke Patients Only) Pre-Morbid Rankin Score: Moderately severe disability Modified Rankin: Severe disability     Balance Overall balance assessment: Needs assistance Sitting-balance support: Feet unsupported, No upper extremity supported Sitting balance-Leahy Scale: Zero Sitting balance - Comments: reliant on support of bed in egress position                                    Cognition Arousal/Alertness: Lethargic Behavior During Therapy: Flat affect Overall Cognitive Status: Impaired/Different from baseline                                 General Comments: pt with no opening of eyes to name or auditory stimulus. Pt with sluggish L pupil restriction to light, disconjucate gaze with manual assist to open eyes. no command follow. withdrawal to pain in bilat LEs but not UEs        Exercises Other Exercises Other Exercises: PROM x4 extremities, pt with noted posturing into extension in bilat UEs limiting ROM at shoulder. Pt with noted bilat knee contractures from prolonged sitting in w/c PTA    General Comments General comments (skin integrity, edema, etc.): posturing of  R UE into extension/pronation      Pertinent Vitals/Pain Pain Assessment Pain Assessment: Faces Faces Pain Scale: No hurt    Home Living                          Prior Function            PT Goals (current goals can now be found in the care plan section) Acute Rehab PT Goals PT Goal Formulation: Patient unable to participate in goal setting Time For Goal Achievement: 01/22/23 Potential to Achieve Goals: Poor Progress towards PT goals: Not progressing toward goals - comment    Frequency    Min 1X/week (for trial)      PT Plan Current  plan remains appropriate    Co-evaluation              AM-PAC PT "6 Clicks" Mobility   Outcome Measure  Help needed turning from your back to your side while in a flat bed without using bedrails?: Total Help needed moving from lying on your back to sitting on the side of a flat bed without using bedrails?: Total Help needed moving to and from a bed to a chair (including a wheelchair)?: Total Help needed standing up from a chair using your arms (e.g., wheelchair or bedside chair)?: Total Help needed to walk in hospital room?: Total Help needed climbing 3-5 steps with a railing? : Total 6 Click Score: 6    End of Session   Activity Tolerance: Patient limited by lethargy Patient left: in bed;with call bell/phone within reach;with bed alarm set;with family/visitor present Nurse Communication: Mobility status PT Visit Diagnosis: Difficulty in walking, not elsewhere classified (R26.2);Unsteadiness on feet (R26.81)     Time: 5284-1324 PT Time Calculation (min) (ACUTE ONLY): 13 min  Charges:  $Therapeutic Exercise: 8-22 mins                     Kittie Plater, PT, DPT Acute Rehabilitation Services Secure chat preferred Office #: [phone]    Berline Lopes 01/15/2023, 2:57 PM

## 2023-01-15 NOTE — Plan of Care (Signed)
  Problem: Nutrition: Goal: Dietary intake will improve Outcome: Progressing   Problem: Health Behavior/Discharge Planning: Goal: Goals will be collaboratively established with patient/family Outcome: Progressing   Problem: Nutrition: Goal: Adequate nutrition will be maintained Outcome: Progressing   Problem: Coping: Goal: Will verbalize positive feelings about self Outcome: Not Progressing   Problem: Coping: Goal: Will identify appropriate support needs Outcome: Not Progressing   Problem: Health Behavior/Discharge Planning: Goal: Ability to manage health-related needs will improve Outcome: Not Progressing   Problem: Self-Care: Goal: Ability to participate in self-care as condition permits will improve Outcome: Not Progressing   Problem: Self-Care: Goal: Verbalization of feelings and concerns over difficulty with self-care will improve Outcome: Not Progressing   Problem: Self-Care: Goal: Ability to communicate needs accurately will improve Outcome: Not Progressing   Problem: Clinical Measurements: Goal: Ability to maintain clinical measurements within normal limits will improve Outcome: Not Progressing   Problem: Elimination: Goal: Will not experience complications related to urinary retention Outcome: Progressing    Problem: Safety: Goal: Ability to remain free from injury will improve Outcome: Progressing

## 2023-01-15 NOTE — Progress Notes (Signed)
PROGRESS NOTE  Pamela Johnston PNT:[id number] DOB: [birth year] DOA: 11/29/2022 PCP: [name], MD   LOS: 2 days   Brief Narrative / Interim history: 82 year old female with history of hypertension who comes into the hospital and is admitted on 11/29/2022 due to decreased responsiveness, aphasia, right-sided weakness.  CT head on admission showed large IPH of left parietal/left temporal regions with extensions into basal ganglia, secondary dissection with IVH and surrounding vasogenic edema with regional mass effect.  She also had several episodes of vomiting with concern for aspiration.  Neurosurgery, neurology were consulted, she was intubated and admitted to neuro ICU.  She was started on 3% hypertonic saline and palliative care was consulted due to poor prognosis.  She eventually underwent tracheostomy as well as PEG placement, and was also started on Keppra due to concern for seizures.  She is currently on trach collar.  Multiple palliative care discussions have been ongoing with the family, at 1 point hospital leadership was involved, she remains full code/full scope.  Earlier she could be placed is a month out from her tracheostomy placement, 2/12.  Significant events: 12/21 - Presented to [facility] ED via EMS as Code Stroke. LKW 2000. Abrupt onset of decreased responsiveness, aphasia and R hemiplegia. Stroke admitting, NSGY consulted. PCCM consult for vent management. Treated with unasyn for aspiration pneumonia. CT Head with large L hemispheric parietal/temporal lobe IPH with extension into basal ganglia and IVH. HTS 3% started.   12/24 - Palliative care consulted, family requested meeting to be delayed 12/25 - Family requested goals of care meeting to be delayed 12/26 - Stopped Unasyn, replete phos; [name] GOC conversation: family says full code 12/27 - Restarted Unasyn for fever, RLL infiltrate; resp culture: enterobacter, MDR. Palliative consulted  12/28 - CT Head unchanged 12/29 - Unasyn  changed to bactrim 12/31 - Posturing, CT head with increased shift, increased bleeding 1/2 - Family fired CVA service after discussion RE poor prognosis  1/4 - Off sedation  1/5 - No acute issues overnight. Remains on vent with increased respirations off sedation. Fever curve slightly decreased  1/6 - Pall care meeting moved to 2pm today. [name] the son-in-law will be present. On ven 30%/ On TF. Neo not started last night. MAP > 65 after fluids bolus. Febrile +.  Last CT head 12/09/22.  1/8 - No significant neurologic/clinical change. Brief GOC discussion with husband, Pamela Johnston, at bedside in AM. Concerns from staff re: patient GOC/decisionmaking, want to ensure husband is included in decisions/information sharing. Attempted bedside meeting but family prematurely ended meeting due to escalated discussion. 1/9 - Febrile overnight to 101.53F, remains on bromocriptine. No significant neurologic exam changes. 1/10 Family meeting yesterday with [name], plan for trach/peg though husband says they are still discussing this morning. Per Dt. Hulen Skains " So moving forward, [name] is the designated medical decision maker for Pamela Johnston " 1/11 no new issues, plan for PEG tomorrow 1/12 for trach/peg today 1/15 No change in Neuro status , trach and PEG, No weaning , will repeat CT head without, get Case management involved in looking for LTAC ( Day 3 post trach on 1/12)   1/15-CT with some stabilization 1/19 No acute events overnight, respiratory culture 1/15 with abundant Pseudomonas, abundant Staphylococcus aureus, and moderate Enterobacter remains on Meropenem  1/22 trach collar trial 12 + hrs 1/24 trach collar x 24 hours on 35% 1/25- concern for seizures-- keppra started after EEG done  Subjective / 24h Interval events: Remains unresponsive.  No overnight events.  She is afebrile, but has low-grade temps of ~ 99  Assesement and Plan: Principal Problem:   ICH (intracerebral hemorrhage) (HCC) Active  Problems:   RLS (restless legs syndrome)   Osteopenia   Sacroiliitis (HCC)   Benign essential HTN   Aspiration into airway   Hypoxia   Intraparenchymal hemorrhage of brain (HCC)   Brain compression (HCC)   Acute respiratory failure with hypoxia (HCC)   Pneumonia due to Enterobacter aerogenes (HCC)   Goals of care, counseling/discussion   Pressure injury of skin   Tracheostomy status (HCC)   Principal problem 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/mass effect Brain compression/ICH Right parietal lobe hemorrhage with associated edema  Severe encephalopathy Intraventricular hemorrhage and mass effect, vasogenic edema, subfalcine herniation -Per prior team members, she is a very poor prognosis.  Multiple family meetings concluded over time with CMO on board, medical decision maker is Pamela Johnston patient's son-in-law. Stroke team signed off. Maintain neuro protective measures. Nutrition and bowel regimen. Aspiration precautions   Active problems Fevers -patient had an episode of fever of one 101.7 on 2/1 evening, and has been having low-grade temp of 100.3 2/3.  Continue to closely monitor, chest x-ray showed minimal atelectasis but no clear-cut infiltrates.  Prior respiratory cultures showing Pseudomonas, most recent on 01/03/2023 showing Carbapenem resistance.  She has been treated for pneumonia in the past, monitor off antibiotics, but if needed could use Zerbaxa per ID given resistance.  D-dimer is elevated, but lower extremity ultrasound is negative for DVT.  PE is in the differential however she is not having worsening respiratory status, may not be stable to transfer down for CT angiogram, and also she could not be anticoagulated due to her devastating hemorrhagic stroke  Seizures - On Keppra   Acute respiratory failure with hypoxia due to large intracerebral hemorrhage - S/p trach/PEG 1/12. Previously completed 10 days of antibiotic  coverage for anterobasilar pneumonia, repeat sputum culture obtained 1/15 positive for abundant Pseudomonas, abundant Staphylococcus aureus, and moderate Enterobacter, s/p course of Meropenem -Continue ATC as tolerated, hope to avoid vent needs -Wean O2 down as tolerated -Bronchial hygiene -culture still with pseudomonas-- ? Colonized now-- will hold on abx for now as no fever -Appreciate PCCM follow-up, last seen 2/5   Urinary retention -foley placed 1/23, Will need urologic follow up as outpatient   History of chronic sacroiliitis Osteopenia RLS -Supportive care  Scheduled Meds:  Chlorhexidine Gluconate Cloth  6 each Topical Daily   famotidine  20 mg Per Tube QHS   feeding supplement (PROSource TF20)  60 mL Per Tube Daily   Gerhardt's butt cream   Topical QID   latanoprost  1 drop Both Eyes QHS   levETIRAcetam  500 mg Per Tube BID   metoprolol tartrate  12.5 mg Per Tube BID   nutrition supplement (JUVEN)  1 packet Per Tube BID BM   mouth rinse  15 mL Mouth Rinse Q2H   rosuvastatin  20 mg Per Tube Daily   Continuous Infusions:  sodium chloride Stopped (01/01/23 0424)   feeding supplement (JEVITY 1.2 CAL) 55 mL/hr at 01/14/23 1656   PRN Meds:.acetaminophen **OR** acetaminophen (TYLENOL) oral liquid 160 mg/5 mL **OR** acetaminophen, bisacodyl, docusate, morphine injection, mouth rinse, phenylephrine-shark liver oil-mineral oil-petrolatum, polyethylene glycol  Current Outpatient Medications  Medication Instructions   latanoprost (XALATAN) 0.005 % ophthalmic solution 1 drop, Both Eyes, Daily at bedtime   oxyCODONE-acetaminophen (PERCOCET/ROXICET) 5-325 MG tablet 1 tablet, Oral, Every 4 hours PRN   pramipexole (  MIRAPEX) 0.25 mg, Oral, Nightly    Diet Orders (From admission, onward)     Start     Ordered   12/19/22 0001  Diet NPO time specified  Diet effective midnight        12/18/22 1632            DVT prophylaxis: SCD's Start: 11/29/22 2215   Lab Results   Component Value Date   PLT 268 01/15/2023      Code Status: Full Code  Family Communication: No family at bedside  Status is: Inpatient  Remains inpatient appropriate because: Severity of illness  Level of care: Progressive  Consultants:  Neurology Neurosurgery PCCM Palliative care  Objective: Vitals:   01/14/23 2046 01/14/23 2304 01/15/23 0312 01/15/23 0358  BP:  (!) 143/68 (!) 120/59   Pulse: (!) 113 (!) 122 (!) 119 (!) 121  Resp: (!) 27 (!) 23 (!) 28 (!) 28  Temp:  99.2 F (37.3 C) 99.4 F (37.4 C)   TempSrc:  Axillary Axillary   SpO2: 96% 94% 94% 94%  Weight:      Height:        Intake/Output Summary (Last 24 hours) at 01/15/2023 0837 Last data filed at 01/15/2023 0313 Gross per 24 hour  Intake 491.33 ml  Output 1300 ml  Net -808.67 ml    Wt Readings from Last 3 Encounters:  01/14/23 62.8 kg    Examination:  Constitutional: NAD, unresponsive Eyes: lids and conjunctivae normal, no scleral icterus ENMT: mmm Neck: normal, supple Respiratory: clear to auscultation bilaterally, no wheezing, no crackles. Normal respiratory effort.  Cardiovascular: Regular rate and rhythm, no murmurs / rubs / gallops. No LE edema. Abdomen: soft, no distention, no tenderness. Bowel sounds positive.  Skin: no rashes  Data Reviewed: I have independently reviewed following labs and imaging studies   CBC Recent Labs  Lab 01/11/23 0349 01/13/23 0609 01/15/23 0258  WBC 7.7 7.8 9.3  HGB 9.9* 10.4* 10.3*  HCT 32.1* 34.5* 32.5*  PLT 391 309 268  MCV 98.5 98.9 97.0  MCH 30.4 29.8 30.7  MCHC 30.8 30.1 31.7  RDW 15.1 15.0 15.2  LYMPHSABS 2.2  --   --   MONOABS 0.8  --   --   EOSABS 0.3  --   --   BASOSABS 0.1  --   --      Recent Labs  Lab 01/11/23 0349 01/12/23 1043 01/13/23 0609 01/15/23 0258  NA 139  --  139 139  K 3.9  --  4.2 3.9  CL 106  --  106 105  CO2 26  --  27 25  GLUCOSE 130*  --  129* 140*  BUN 31*  --  36* 36*  CREATININE 0.41*  --  0.47 0.53   CALCIUM 8.2*  --  8.2* 8.4*  AST  --   --  48* 42*  ALT  --   --  39 38  ALKPHOS  --   --  151* 137*  BILITOT  --   --  0.1* 0.3  ALBUMIN  --   --  2.2* 2.1*  MG  --   --  2.3 2.1  DDIMER  --  7.95*  --   --      ------------------------------------------------------------------------------------------------------------------ No results for input(s): "CHOL", "HDL", "LDLCALC", "TRIG", "CHOLHDL", "LDLDIRECT" in the last 72 hours.  Lab Results  Component Value Date   HGBA1C 5.5 12/01/2022   ------------------------------------------------------------------------------------------------------------------ No results for input(s): "TSH", "T4TOTAL", "T3FREE", "THYROIDAB" in the  last 72 hours.  Invalid input(s): "FREET3"  Cardiac Enzymes No results for input(s): "CKMB", "TROPONINI", "MYOGLOBIN" in the last 168 hours.  Invalid input(s): "CK" ------------------------------------------------------------------------------------------------------------------ No results found for: "BNP"  CBG: Recent Labs  Lab 01/14/23 1156 01/14/23 1652 01/14/23 1933 01/14/23 2307 01/15/23 0311  GLUCAP 123* 126* 131* 149* 144*     Recent Results (from the past 240 hour(s))  Respiratory (~20 pathogens) panel by PCR     Status: None   Collection Time: 01/05/23  9:56 AM   Specimen: Nasopharyngeal Swab; Respiratory  Result Value Ref Range Status   Adenovirus NOT DETECTED NOT DETECTED Final   Coronavirus 229E NOT DETECTED NOT DETECTED Final    Comment: (NOTE) The Coronavirus on the Respiratory Panel, DOES NOT test for the novel  Coronavirus (2019 nCoV)    Coronavirus HKU1 NOT DETECTED NOT DETECTED Final   Coronavirus NL63 NOT DETECTED NOT DETECTED Final   Coronavirus OC43 NOT DETECTED NOT DETECTED Final   Metapneumovirus NOT DETECTED NOT DETECTED Final   Rhinovirus / Enterovirus NOT DETECTED NOT DETECTED Final   Influenza A NOT DETECTED NOT DETECTED Final   Influenza B NOT DETECTED NOT  DETECTED Final   Parainfluenza Virus 1 NOT DETECTED NOT DETECTED Final   Parainfluenza Virus 2 NOT DETECTED NOT DETECTED Final   Parainfluenza Virus 3 NOT DETECTED NOT DETECTED Final   Parainfluenza Virus 4 NOT DETECTED NOT DETECTED Final   Respiratory Syncytial Virus NOT DETECTED NOT DETECTED Final   Bordetella pertussis NOT DETECTED NOT DETECTED Final   Bordetella Parapertussis NOT DETECTED NOT DETECTED Final   Chlamydophila pneumoniae NOT DETECTED NOT DETECTED Final   Mycoplasma pneumoniae NOT DETECTED NOT DETECTED Final    Comment: Performed at [facility], [place]. [address], [place], [place] [address]     Radiology Studies: No results found.   Marzetta Board, MD, PhD [facility]  Between 7 am - 7 pm I am available, please contact me via Amion (for emergencies) or Securechat (non urgent messages)  Between 7 pm - 7 am I am not available, please contact night coverage MD/APP via Amion

## 2023-01-15 NOTE — Consult Note (Signed)
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  Offload with Prevalon boots bilaterally   [place] Nurse team will follow with you and see patient within 10 days for wound assessments.  Please notify [place] nurses of any acute changes in the wounds or any new areas of concern [place] MSN, [place], [place], [place]

## 2023-01-16 DIAGNOSIS — I619 Nontraumatic intracerebral hemorrhage, unspecified: Secondary | ICD-10-CM | POA: Diagnosis not present

## 2023-01-16 DIAGNOSIS — T17908A Unspecified foreign body in respiratory tract, part unspecified causing other injury, initial encounter: Secondary | ICD-10-CM | POA: Diagnosis not present

## 2023-01-16 LAB — GLUCOSE, CAPILLARY
Glucose-Capillary: 108 mg/dL — ABNORMAL HIGH (ref 70–99)
Glucose-Capillary: 123 mg/dL — ABNORMAL HIGH (ref 70–99)
Glucose-Capillary: 128 mg/dL — ABNORMAL HIGH (ref 70–99)
Glucose-Capillary: 130 mg/dL — ABNORMAL HIGH (ref 70–99)
Glucose-Capillary: 133 mg/dL — ABNORMAL HIGH (ref 70–99)
Glucose-Capillary: 147 mg/dL — ABNORMAL HIGH (ref 70–99)

## 2023-01-16 NOTE — Progress Notes (Signed)
PROGRESS NOTE  Pamela Johnston ATF:[id number] DOB: [birth year] DOA: 11/29/2022 PCP: Cecil Cobbs, MD   LOS: 48 days   Brief Narrative / Interim history: 82 year old female with history of hypertension who comes into the hospital and is admitted on 11/29/2022 due to decreased responsiveness, aphasia, right-sided weakness.  CT head on admission showed large IPH of left parietal/left temporal regions with extensions into basal ganglia, secondary dissection with IVH and surrounding vasogenic edema with regional mass effect.  She also had several episodes of vomiting with concern for aspiration.  Neurosurgery, neurology were consulted, she was intubated and admitted to neuro ICU.  She was started on 3% hypertonic saline and palliative care was consulted due to poor prognosis.  She eventually underwent tracheostomy as well as PEG placement, and was also started on Keppra due to concern for seizures.  She is currently on trach collar.  Multiple palliative care discussions have been ongoing with the family, at 1 point hospital leadership was involved, she remains full code/full scope.  Earlier she could be placed is a month out from her tracheostomy placement, 2/12.   Significant events: 12/21 - Presented to [facility] ED via EMS as Code Stroke. LKW 2000. Abrupt onset of decreased responsiveness, aphasia and R hemiplegia. Stroke admitting, NSGY consulted. PCCM consult for vent management. Treated with unasyn for aspiration pneumonia. CT Head with large L hemispheric parietal/temporal lobe IPH with extension into basal ganglia and IVH. HTS 3% started.   12/24 - Palliative care consulted, family requested meeting to be delayed 12/25 - Family requested goals of care meeting to be delayed 12/26 - Stopped Unasyn, replete phos; [name] GOC conversation: family says full code 12/27 - Restarted Unasyn for fever, RLL infiltrate; resp culture: enterobacter, MDR. Palliative consulted  12/28 - CT Head unchanged 12/29 - Unasyn  changed to bactrim 12/31 - Posturing, CT head with increased shift, increased bleeding 1/2 - Family fired CVA service after discussion RE poor prognosis  1/4 - Off sedation  1/5 - No acute issues overnight. Remains on vent with increased respirations off sedation. Fever curve slightly decreased  1/6 - Pall care meeting moved to 2pm today. Theodoro Grist the son-in-law will be present. On ven 30%/ On TF. Neo not started last night. MAP > 65 after fluids bolus. Febrile +.  Last CT head 12/09/22.  1/8 - No significant neurologic/clinical change. Brief GOC discussion with husband, Pamela Johnston, at bedside in AM. Concerns from staff re: patient GOC/decisionmaking, want to ensure husband is included in decisions/information sharing. Attempted bedside meeting but family prematurely ended meeting due to escalated discussion. 1/9 - Febrile overnight to 101.32F, remains on bromocriptine. No significant neurologic exam changes. 1/10 Family meeting yesterday with Dr. Lindie Spruce, plan for trach/peg though husband says they are still discussing this morning. Per Dt. Lindie Spruce " So moving forward, Pamela Johnston is the designated medical decision maker for Pamela Johnston " 1/11 no new issues, plan for PEG tomorrow 1/12 for trach/peg today 1/15 No change in Neuro status , trach and PEG, No weaning , will repeat CT head without, get Case management involved in looking for LTAC ( Day 3 post trach on 1/12)   1/15-CT with some stabilization 1/19 No acute events overnight, respiratory culture 1/15 with abundant Pseudomonas, abundant Staphylococcus aureus, and moderate Enterobacter remains on Meropenem  1/22 trach collar trial 12 + hrs 1/24 trach collar x 24 hours on 35% 1/25- concern for seizures-- keppra started after EEG done  Subjective / 24h Interval events: No changes- no overnight events  Assesement and Plan: Principal Problem:   ICH (intracerebral hemorrhage) (HCC) Active Problems:   RLS (restless legs syndrome)   Osteopenia    Sacroiliitis (HCC)   Benign essential HTN   Aspiration into airway   Hypoxia   Intraparenchymal hemorrhage of brain (HCC)   Brain compression (HCC)   Acute respiratory failure with hypoxia (HCC)   Pneumonia due to Enterobacter aerogenes (HCC)   Goals of care, counseling/discussion   Pressure injury of skin   Tracheostomy status ([place])    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/mass effect Brain compression/ICH Right parietal lobe hemorrhage with associated edema  Severe encephalopathy Intraventricular hemorrhage and mass effect, vasogenic edema, subfalcine herniation -Per prior team members, she is a very poor prognosis.  Multiple family meetings concluded over time with CMO on board, medical decision maker is Pamela Johnston patient's son-in-law. Stroke team signed off. Maintain neuro protective measures. Nutrition and bowel regimen. Aspiration precautions    Fevers -patient had an episode of fever of one 101.7 on 2/1 evening -.  Continue to closely monitor, chest x-ray showed minimal atelectasis but no clear-cut infiltrates.  Prior respiratory cultures showing Pseudomonas, most recent on 01/03/2023 showing Carbapenem resistance.  She has been treated for pneumonia in the past, monitor off antibiotics, but if needed could use Zerbaxa per ID given resistance.  D-dimer is elevated, but lower extremity ultrasound is negative for DVT.  PE is in the differential however she is not having worsening respiratory status, may not be stable to transfer down for CT angiogram, and also she could not be anticoagulated due to her devastating hemorrhagic stroke  Seizures - On Keppra   Acute respiratory failure with hypoxia due to large intracerebral hemorrhage - S/p trach/PEG 1/12. Previously completed 10 days of antibiotic coverage for anterobasilar pneumonia, repeat sputum culture obtained 1/15 positive for abundant Pseudomonas, abundant Staphylococcus aureus, and  moderate Enterobacter, s/p course of Meropenem -Continue ATC as tolerated, hope to avoid vent needs -Wean O2 down as tolerated -Bronchial hygiene -Appreciate PCCM follow-up, last seen 2/5   Urinary retention -foley placed 1/23, Will need urologic follow up as outpatient   History of chronic sacroiliitis Osteopenia RLS -Supportive care  Scheduled Meds:  Chlorhexidine Gluconate Cloth  6 each Topical Daily   famotidine  20 mg Per Tube QHS   feeding supplement (PROSource TF20)  60 mL Per Tube Daily   Gerhardt's butt cream   Topical QID   latanoprost  1 drop Both Eyes QHS   levETIRAcetam  500 mg Per Tube BID   metoprolol tartrate  12.5 mg Per Tube BID   nutrition supplement (JUVEN)  1 packet Per Tube BID BM   mouth rinse  15 mL Mouth Rinse Q2H   rosuvastatin  20 mg Per Tube Daily   Continuous Infusions:  sodium chloride Stopped (01/01/23 0424)   feeding supplement (JEVITY 1.2 CAL) 55 mL/hr at 01/14/23 1656   PRN Meds:.acetaminophen **OR** acetaminophen (TYLENOL) oral liquid 160 mg/5 mL **OR** acetaminophen, bisacodyl, docusate, morphine injection, mouth rinse, phenylephrine-shark liver oil-mineral oil-petrolatum, polyethylene glycol  Current Outpatient Medications  Medication Instructions   latanoprost (XALATAN) 0.005 % ophthalmic solution 1 drop, Both Eyes, Daily at bedtime   oxyCODONE-acetaminophen (PERCOCET/ROXICET) 5-325 MG tablet 1 tablet, Oral, Every 4 hours PRN   pramipexole (MIRAPEX) 0.25 mg, Oral, Nightly    Diet Orders (From admission, onward)     Start     Ordered   12/19/22 0001  Diet NPO time specified  Diet effective midnight  12/18/22 1632            DVT prophylaxis: SCD's Start: 11/29/22 2215   Lab Results  Component Value Date   PLT 268 01/15/2023      Code Status: Full Code  Family Communication: No family at bedside  Status is: Inpatient  Remains inpatient appropriate because: needs placement  Level of care:  Progressive  Consultants:  Neurology Neurosurgery PCCM Palliative care  Objective: Vitals:   01/16/23 0319 01/16/23 0359 01/16/23 0738 01/16/23 0740  BP: 119/63 119/63  (!) 148/70  Pulse: 95 98 (!) 125 (!) 118  Resp: (!) 24 (!) 26 18 (!) 29  Temp: 99.4 F (37.4 C)   99.1 F (37.3 C)  TempSrc: Oral   Oral  SpO2: 96% 97% 96% 95%  Weight:      Height:        Intake/Output Summary (Last 24 hours) at 01/16/2023 3244 Last data filed at 01/16/2023 0102 Gross per 24 hour  Intake --  Output 1000 ml  Net -1000 ml   Wt Readings from Last 3 Encounters:  01/14/23 62.8 kg    Examination:   General: Appearance:     Overweight female in no acute distress     Lungs:     respirations unlabored  Heart:    Tachycardic. Normal rhythm. No murmurs, rubs, or gallops.   MS:   All extremities are intact.   Neurologic:   unresponsive    Data Reviewed: I have independently reviewed following labs and imaging studies   CBC Recent Labs  Lab 01/11/23 0349 01/13/23 0609 01/15/23 0258  WBC 7.7 7.8 9.3  HGB 9.9* 10.4* 10.3*  HCT 32.1* 34.5* 32.5*  PLT 391 309 268  MCV 98.5 98.9 97.0  MCH 30.4 29.8 30.7  MCHC 30.8 30.1 31.7  RDW 15.1 15.0 15.2  LYMPHSABS 2.2  --   --   MONOABS 0.8  --   --   EOSABS 0.3  --   --   BASOSABS 0.1  --   --     Recent Labs  Lab 01/11/23 0349 01/12/23 1043 01/13/23 0609 01/15/23 0258  NA 139  --  139 139  K 3.9  --  4.2 3.9  CL 106  --  106 105  CO2 26  --  27 25  GLUCOSE 130*  --  129* 140*  BUN 31*  --  36* 36*  CREATININE 0.41*  --  0.47 0.53  CALCIUM 8.2*  --  8.2* 8.4*  AST  --   --  48* 42*  ALT  --   --  39 38  ALKPHOS  --   --  151* 137*  BILITOT  --   --  0.1* 0.3  ALBUMIN  --   --  2.2* 2.1*  MG  --   --  2.3 2.1  DDIMER  --  7.95*  --   --     ------------------------------------------------------------------------------------------------------------------ No results for input(s): "CHOL", "HDL", "LDLCALC", "TRIG", "CHOLHDL",  "LDLDIRECT" in the last 72 hours.  Lab Results  Component Value Date   HGBA1C 5.5 12/01/2022   ------------------------------------------------------------------------------------------------------------------ No results for input(s): "TSH", "T4TOTAL", "T3FREE", "THYROIDAB" in the last 72 hours.  Invalid input(s): "FREET3"  Cardiac Enzymes No results for input(s): "CKMB", "TROPONINI", "MYOGLOBIN" in the last 168 hours.  Invalid input(s): "CK" ------------------------------------------------------------------------------------------------------------------ No results found for: "BNP"  CBG: Recent Labs  Lab 01/15/23 1614 01/15/23 1918 01/15/23 2305 01/16/23 0320 01/16/23 0739  GLUCAP 138* 118* 142* 130*  108*    No results found for this or any previous visit (from the past 240 hour(s)).    Radiology Studies: No results found.   Eulogio Bear DO [facility]  Between 7 am - 7 pm I am available, please contact me via Amion (for emergencies) or Securechat (non urgent messages)  Between 7 pm - 7 am I am not available, please contact night coverage MD/APP via Amion

## 2023-01-16 NOTE — Progress Notes (Signed)
Nutrition Follow-up  DOCUMENTATION CODES:   Not applicable  INTERVENTION:   Continue tube feeding via PEG tube: - Jevity 1.2 @ 55 ml/hr (1320 ml/day) - PROSource TF20 60 ml daily  Tube feeding regimen provides 1664 kcal, 93 grams of protein, and 1065 ml of H2O.   - Continue 1 packet Juven BID per tube, each packet provides 95 calories, 2.5 grams of protein, and 9.8 grams of carbohydrate; also contains L-arginine and L-glutamine, vitamin C, vitamin E, vitamin B-12, zinc, calcium, and calcium Beta-hydroxy-Beta-methylbutyrate to support wound healing  NUTRITION DIAGNOSIS:   Inadequate oral intake related to inability to eat as evidenced by NPO status.  Ongoing, being addressed via TF  GOAL:   Patient will meet greater than or equal to 90% of their needs  Met via TF  MONITOR:   Labs, Weight trends, TF tolerance, Skin  REASON FOR ASSESSMENT:   Consult Enteral/tube feeding initiation and management  ASSESSMENT:   82 year old female with PMHx of HTN, osteopenia, RLS, sacroiliitis admitted with large left hemispheric parietal/temporal IPH with vasogenic edema/mass effect, also with acute respiratory failure and concern for aspiration PNA given emesis pre-intubation.  01/12 - s/p trach/PEG  Pt with continuous tube feeds infusing via PEG. Discussed with RN who reports pt is tolerating tube feeds without issue. Weight up slightly. Will continue with current tube feeding regimen and Juven to promote wound healing.  Noted pt with non-pitting edema to BUE.  Current TF: Jevity 1.2 @ 55 ml/hr, PROSource TF20 60 ml daily  Admit weight: 57.8 kg Current weight: 62.8 kg  Medications reviewed and include: pepcid, Juven BID  Labs reviewed. CBG's: 108-142 x 24 hours  Diet Order:   Diet Order             Diet NPO time specified  Diet effective midnight                   EDUCATION NEEDS:   Not appropriate for education at this time  Skin:  Skin Assessment: Skin  Integrity Issues: DTI: R ankle Stage I: R ear  Last BM:  01/16/23 small type 6  Height:   Ht Readings from Last 1 Encounters:  11/29/22 5\' 1"  (1.549 m)    Weight:   Wt Readings from Last 1 Encounters:  01/14/23 62.8 kg    Ideal Body Weight:  47.7 kg  BMI:  Body mass index is 26.16 kg/m.  Estimated Nutritional Needs:   Kcal:  1500-1700  Protein:  80-100 grams  Fluid:  1.5-1.7 L/day    Gustavus Bryant, MS, RD, LDN Inpatient Clinical Dietitian Please see AMiON for contact information.

## 2023-01-16 NOTE — Progress Notes (Addendum)
Pt foley noted to be leaking several times this shift. Attempt made to reinflate balloon and reposition without success. Foley removed and new foley inserted with Paramedic. Clear yellow urine returned. Pt continues to require chronic foley.

## 2023-01-17 DIAGNOSIS — I619 Nontraumatic intracerebral hemorrhage, unspecified: Secondary | ICD-10-CM | POA: Diagnosis not present

## 2023-01-17 LAB — GLUCOSE, CAPILLARY
Glucose-Capillary: 121 mg/dL — ABNORMAL HIGH (ref 70–99)
Glucose-Capillary: 125 mg/dL — ABNORMAL HIGH (ref 70–99)
Glucose-Capillary: 127 mg/dL — ABNORMAL HIGH (ref 70–99)
Glucose-Capillary: 131 mg/dL — ABNORMAL HIGH (ref 70–99)
Glucose-Capillary: 137 mg/dL — ABNORMAL HIGH (ref 70–99)
Glucose-Capillary: 142 mg/dL — ABNORMAL HIGH (ref 70–99)

## 2023-01-17 NOTE — Progress Notes (Signed)
PROGRESS NOTE  FAJR FIFE ZOX:[id number] DOB: Apr 25, 1941 DOA: 11/29/2022 PCP: [name], MD   LOS: 45 days   Brief Narrative / Interim history: 82 year old female with history of hypertension who comes into the hospital and is admitted on 11/29/2022 due to decreased responsiveness, aphasia, right-sided weakness.  CT head on admission showed large IPH of left parietal/left temporal regions with extensions into basal ganglia, secondary dissection with IVH and surrounding vasogenic edema with regional mass effect.  She also had several episodes of vomiting with concern for aspiration.  Neurosurgery, neurology were consulted, she was intubated and admitted to neuro ICU.  She was started on 3% hypertonic saline and palliative care was consulted due to poor prognosis.  She eventually underwent tracheostomy as well as PEG placement, and was also started on Keppra due to concern for seizures.  She is currently on trach collar.  Multiple palliative care discussions have been ongoing with the family, at 1 point hospital leadership was involved, she remains full code/full scope.  Earlier she could be placed is a month out from her tracheostomy placement, 2/12.   Significant events: 12/21 - Presented to [facility] ED via EMS as Code Stroke. LKW 2000. Abrupt onset of decreased responsiveness, aphasia and R hemiplegia. Stroke admitting, NSGY consulted. PCCM consult for vent management. Treated with unasyn for aspiration pneumonia. CT Head with large L hemispheric parietal/temporal lobe IPH with extension into basal ganglia and IVH. HTS 3% started.   12/24 - Palliative care consulted, family requested meeting to be delayed 12/25 - Family requested goals of care meeting to be delayed 12/26 - Stopped Unasyn, replete phos; [name] GOC conversation: family says full code 12/27 - Restarted Unasyn for fever, RLL infiltrate; resp culture: enterobacter, MDR. Palliative consulted  12/28 - CT Head unchanged 12/29 - Unasyn  changed to bactrim 12/31 - Posturing, CT head with increased shift, increased bleeding 1/2 - Family fired CVA service after discussion RE poor prognosis  1/4 - Off sedation  1/5 - No acute issues overnight. Remains on vent with increased respirations off sedation. Fever curve slightly decreased  1/6 - Pall care meeting moved to 2pm today. [name] the son-in-law will be present. On ven 30%/ On TF. Neo not started last night. MAP > 65 after fluids bolus. Febrile +.  Last CT head 12/09/22.  1/8 - No significant neurologic/clinical change. Brief GOC discussion with husband, [name], at bedside in AM. Concerns from staff re: patient GOC/decisionmaking, want to ensure husband is included in decisions/information sharing. Attempted bedside meeting but family prematurely ended meeting due to escalated discussion. 1/9 - Febrile overnight to 101.53F, remains on bromocriptine. No significant neurologic exam changes. 1/10 Family meeting yesterday with [name], plan for trach/peg though husband says they are still discussing this morning. Per Dt. Hulen Skains " So moving forward, [name] is the designated medical decision maker for Cerina Leary " 1/11 no new issues, plan for PEG tomorrow 1/12 for trach/peg today 1/15 No change in Neuro status , trach and PEG, No weaning , will repeat CT head without, get Case management involved in looking for LTAC ( Day 3 post trach on 1/12)   1/15-CT with some stabilization 1/19 No acute events overnight, respiratory culture 1/15 with abundant Pseudomonas, abundant Staphylococcus aureus, and moderate Enterobacter remains on Meropenem  1/22 trach collar trial 12 + hrs 1/24 trach collar x 24 hours on 35% 1/25- concern for seizures-- keppra started after EEG done  Subjective / 24h Interval events: Getting a bath today-- appears to  have redness under b/l breast  Assesement and Plan: Principal Problem:   ICH (intracerebral hemorrhage) (HCC) Active Problems:   RLS (restless  legs syndrome)   Osteopenia   Sacroiliitis (HCC)   Benign essential HTN   Aspiration into airway   Hypoxia   Intraparenchymal hemorrhage of brain (HCC)   Brain compression (HCC)   Acute respiratory failure with hypoxia (HCC)   Pneumonia due to Enterobacter aerogenes (HCC)   Goals of care, counseling/discussion   Pressure injury of skin   Tracheostomy status (HCC)    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/mass effect Brain compression/ICH Right parietal lobe hemorrhage with associated edema  Severe encephalopathy Intraventricular hemorrhage and mass effect, vasogenic edema, subfalcine herniation -Per prior team members, she is a very poor prognosis.  Multiple family meetings concluded over time with CMO on board, medical decision maker is [name] patient's son-in-law. Stroke team signed off. Maintain neuro protective measures. Nutrition and bowel regimen. Aspiration precautions    Fevers -patient had an episode of fever of one 101.7 on 2/1 evening -.  Continue to closely monitor, chest x-ray showed minimal atelectasis but no clear-cut infiltrates.  Prior respiratory cultures showing Pseudomonas, most recent on 01/03/2023 showing Carbapenem resistance.  She has been treated for pneumonia in the past, monitor off antibiotics, but if needed could use Zerbaxa per ID given resistance.  D-dimer is elevated, but lower extremity ultrasound is negative for DVT.  PE is in the differential however she is not having worsening respiratory status, may not be stable to transfer down for CT angiogram, and also she could not be anticoagulated due to her devastating hemorrhagic stroke  Seizures - On Keppra   Acute respiratory failure with hypoxia due to large intracerebral hemorrhage - S/p trach/PEG 1/12. Previously completed 10 days of antibiotic coverage for anterobasilar pneumonia, repeat sputum culture obtained 1/15 positive for abundant Pseudomonas,  abundant Staphylococcus aureus, and moderate Enterobacter, s/p course of Meropenem -Continue ATC as tolerated, hope to avoid vent needs -Wean O2 down as tolerated -Bronchial hygiene -Appreciate PCCM follow-up, last seen 2/5   Urinary retention -foley placed 1/23, Will need urologic follow up as outpatient   History of chronic sacroiliitis Osteopenia RLS -Supportive care  Scheduled Meds:  Chlorhexidine Gluconate Cloth  6 each Topical Daily   famotidine  20 mg Per Tube QHS   feeding supplement (PROSource TF20)  60 mL Per Tube Daily   Gerhardt's butt cream   Topical QID   latanoprost  1 drop Both Eyes QHS   levETIRAcetam  500 mg Per Tube BID   metoprolol tartrate  12.5 mg Per Tube BID   nutrition supplement (JUVEN)  1 packet Per Tube BID BM   mouth rinse  15 mL Mouth Rinse Q2H   rosuvastatin  20 mg Per Tube Daily   Continuous Infusions:  sodium chloride Stopped (01/01/23 0424)   feeding supplement (JEVITY 1.2 CAL) 55 mL/hr at 01/14/23 1656   PRN Meds:.acetaminophen **OR** acetaminophen (TYLENOL) oral liquid 160 mg/5 mL **OR** acetaminophen, bisacodyl, docusate, morphine injection, mouth rinse, phenylephrine-shark liver oil-mineral oil-petrolatum, polyethylene glycol  Current Outpatient Medications  Medication Instructions   latanoprost (XALATAN) 0.005 % ophthalmic solution 1 drop, Both Eyes, Daily at bedtime   oxyCODONE-acetaminophen (PERCOCET/ROXICET) 5-325 MG tablet 1 tablet, Oral, Every 4 hours PRN   pramipexole (MIRAPEX) 0.25 mg, Oral, Nightly    Diet Orders (From admission, onward)     Start     Ordered   12/19/22 0001  Diet NPO time  specified  Diet effective midnight        12/18/22 1632            DVT prophylaxis: SCD's Start: 11/29/22 2215   Lab Results  Component Value Date   PLT 268 01/15/2023      Code Status: Full Code  Family Communication: No family at bedside  Status is: Inpatient  Remains inpatient appropriate because: needs  placement  Level of care: Progressive  Consultants:  Neurology Neurosurgery PCCM Palliative care  Objective: Vitals:   01/17/23 0334 01/17/23 0348 01/17/23 0723 01/17/23 0740  BP:   126/66 115/60  Pulse:  100  (!) 107  Resp:  (!) 30 (!) 22 (!) 31  Temp:    99.4 F (37.4 C)  TempSrc:    Axillary  SpO2:  93% 94% 94%  Weight: 59.2 kg     Height:        Intake/Output Summary (Last 24 hours) at 01/17/2023 1002 Last data filed at 01/17/2023 0354 Gross per 24 hour  Intake --  Output 450 ml  Net -450 ml   Wt Readings from Last 3 Encounters:  01/17/23 59.2 kg    Examination:    General: Appearance:    Well developed, well nourished female in no acute distress     Lungs:      respirations unlabored  Heart:    Tachycardic. Normal rhythm. No murmurs, rubs, or gallops.   MS:   All extremities are intact.   Neurologic:   Awake, alert    Data Reviewed: I have independently reviewed following labs and imaging studies   CBC Recent Labs  Lab 01/11/23 0349 01/13/23 0609 01/15/23 0258  WBC 7.7 7.8 9.3  HGB 9.9* 10.4* 10.3*  HCT 32.1* 34.5* 32.5*  PLT 391 309 268  MCV 98.5 98.9 97.0  MCH 30.4 29.8 30.7  MCHC 30.8 30.1 31.7  RDW 15.1 15.0 15.2  LYMPHSABS 2.2  --   --   MONOABS 0.8  --   --   EOSABS 0.3  --   --   BASOSABS 0.1  --   --     Recent Labs  Lab 01/11/23 0349 01/12/23 1043 01/13/23 0609 01/15/23 0258  NA 139  --  139 139  K 3.9  --  4.2 3.9  CL 106  --  106 105  CO2 26  --  27 25  GLUCOSE 130*  --  129* 140*  BUN 31*  --  36* 36*  CREATININE 0.41*  --  0.47 0.53  CALCIUM 8.2*  --  8.2* 8.4*  AST  --   --  48* 42*  ALT  --   --  39 38  ALKPHOS  --   --  151* 137*  BILITOT  --   --  0.1* 0.3  ALBUMIN  --   --  2.2* 2.1*  MG  --   --  2.3 2.1  DDIMER  --  7.95*  --   --     ------------------------------------------------------------------------------------------------------------------ No results for input(s): "CHOL", "HDL", "LDLCALC",  "TRIG", "CHOLHDL", "LDLDIRECT" in the last 72 hours.  Lab Results  Component Value Date   HGBA1C 5.5 12/01/2022   ------------------------------------------------------------------------------------------------------------------ No results for input(s): "TSH", "T4TOTAL", "T3FREE", "THYROIDAB" in the last 72 hours.  Invalid input(s): "FREET3"  Cardiac Enzymes No results for input(s): "CKMB", "TROPONINI", "MYOGLOBIN" in the last 168 hours.  Invalid input(s): "CK" ------------------------------------------------------------------------------------------------------------------ No results found for: "BNP"  CBG: Recent Labs  Lab 01/16/23 1537 01/16/23  1943 01/16/23 2335 01/17/23 0330 01/17/23 0746  GLUCAP 128* 133* 147* 125* 121*    No results found for this or any previous visit (from the past 240 hour(s)).    Radiology Studies: No results found.   Eulogio Bear DO [facility]  Between 7 am - 7 pm I am available, please contact me via Amion (for emergencies) or Securechat (non urgent messages)  Between 7 pm - 7 am I am not available, please contact night coverage MD/APP via Amion

## 2023-01-18 DIAGNOSIS — I619 Nontraumatic intracerebral hemorrhage, unspecified: Secondary | ICD-10-CM | POA: Diagnosis not present

## 2023-01-18 LAB — GLUCOSE, CAPILLARY
Glucose-Capillary: 121 mg/dL — ABNORMAL HIGH (ref 70–99)
Glucose-Capillary: 121 mg/dL — ABNORMAL HIGH (ref 70–99)

## 2023-01-18 MED ORDER — PHENYLEPHRINE-MINERAL OIL-PET 0.25-14-74.9 % RE OINT
1.0000 | TOPICAL_OINTMENT | Freq: Two times a day (BID) | RECTAL | Status: DC
  Administered 2023-01-18 – 2023-02-27 (×81): 1 via RECTAL
  Filled 2023-01-18 (×2): qty 57

## 2023-01-18 MED ORDER — FUROSEMIDE 10 MG/ML IJ SOLN
20.0000 mg | Freq: Once | INTRAMUSCULAR | Status: AC
  Administered 2023-01-18: 20 mg via INTRAVENOUS
  Filled 2023-01-18: qty 2

## 2023-01-18 NOTE — Progress Notes (Signed)
PROGRESS NOTE  Pamela Johnston [id number] DOB: 09-10-1941 DOA: 11/29/2022 PCP: [name], MD   LOS: 50 days   Brief Narrative / Interim history: 82 year old female with history of hypertension who comes into the hospital and is admitted on 11/29/2022 due to decreased responsiveness, aphasia, right-sided weakness.  CT head on admission showed large IPH of left parietal/left temporal regions with extensions into basal ganglia, secondary dissection with IVH and surrounding vasogenic edema with regional mass effect.  She also had several episodes of vomiting with concern for aspiration.  Neurosurgery, neurology were consulted, she was intubated and admitted to neuro ICU.  She was started on 3% hypertonic saline and palliative care was consulted due to poor prognosis.  She eventually underwent tracheostomy as well as PEG placement, and was also started on Keppra due to concern for seizures.  She is currently on trach collar.  Multiple palliative care discussions have been ongoing with the family, at 1 point hospital leadership was involved, she remains full code/full scope.  Earliest she could be placed is a month out from her tracheostomy placement, 2/12.   Significant events: 12/21 - Presented to [facility] ED via EMS as Code Stroke. LKW 2000. Abrupt onset of decreased responsiveness, aphasia and R hemiplegia. Stroke admitting, NSGY consulted. PCCM consult for vent management. Treated with unasyn for aspiration pneumonia. CT Head with large L hemispheric parietal/temporal lobe IPH with extension into basal ganglia and IVH. HTS 3% started.   12/24 - Palliative care consulted, family requested meeting to be delayed 12/25 - Family requested goals of care meeting to be delayed 12/26 - Stopped Unasyn, replete phos; [name] GOC conversation: family says full code 12/27 - Restarted Unasyn for fever, RLL infiltrate; resp culture: enterobacter, MDR. Palliative consulted  12/28 - CT Head unchanged 12/29 - Unasyn  changed to bactrim 12/31 - Posturing, CT head with increased shift, increased bleeding 1/2 - Family fired CVA service after discussion RE poor prognosis  1/4 - Off sedation  1/5 - No acute issues overnight. Remains on vent with increased respirations off sedation. Fever curve slightly decreased  1/6 - Pall care meeting moved to 2pm today. [name] the son-in-law will be present. On ven 30%/ On TF. Neo not started last night. MAP > 65 after fluids bolus. Febrile +.  Last CT head 12/09/22.  1/8 - No significant neurologic/clinical change. Brief GOC discussion with husband, Pamela Johnston, at bedside in AM. Concerns from staff re: patient GOC/decisionmaking, want to ensure husband is included in decisions/information sharing. Attempted bedside meeting but family prematurely ended meeting due to escalated discussion. 1/9 - Febrile overnight to 101.64F, remains on bromocriptine. No significant neurologic exam changes. 1/10 Family meeting yesterday with [name], plan for trach/peg though husband says they are still discussing this morning. Per Dt. Hulen Skains " So moving forward, [name] is the designated medical decision maker for Pamela Johnston " 1/11 no new issues, plan for PEG tomorrow 1/12 for trach/peg today 1/15 No change in Neuro status , trach and PEG, No weaning , will repeat CT head without, get Case management involved in looking for LTAC ( Day 3 post trach on 1/12)   1/15-CT with some stabilization 1/19 No acute events overnight, respiratory culture 1/15 with abundant Pseudomonas, abundant Staphylococcus aureus, and moderate Enterobacter remains on Meropenem  1/22 trach collar trial 12 + hrs 1/24 trach collar x 24 hours on 35% 1/25- concern for seizures-- keppra started after EEG done  Subjective / 24h Interval events: Husband at bedside, seems to have  more secretions this AM and needs to be suctioned  Assesement and Plan: Principal Problem:   ICH (intracerebral hemorrhage) (HCC) Active  Problems:   RLS (restless legs syndrome)   Osteopenia   Sacroiliitis (HCC)   Benign essential HTN   Aspiration into airway   Hypoxia   Intraparenchymal hemorrhage of brain (HCC)   Brain compression (HCC)   Acute respiratory failure with hypoxia (HCC)   Pneumonia due to Enterobacter aerogenes (HCC)   Goals of care, counseling/discussion   Pressure injury of skin   Tracheostomy status ([place])    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/mass effect Brain compression/ICH Right parietal lobe hemorrhage with associated edema  Severe encephalopathy Intraventricular hemorrhage and mass effect, vasogenic edema, subfalcine herniation -Per prior team members, she is a very poor prognosis.  Multiple family meetings concluded over time with CMO on board, medical decision maker is Pamela Johnston patient's son-in-law. Stroke team signed off. Maintain neuro protective measures. Nutrition and bowel regimen. Aspiration precautions    Fevers -patient had an episode of fever of one 101.7 on 2/1 evening - Continue to closely monitor, chest x-ray showed minimal atelectasis but no clear-cut infiltrates.  Prior respiratory cultures showing Pseudomonas, most recent on 01/03/2023 showing Carbapenem resistance.  She has been treated for pneumonia in the past, monitor off antibiotics, but if needed could use Zerbaxa per ID given resistance.  D-dimer is elevated, but lower extremity ultrasound is negative for DVT.  PE is in the differential however she is not having worsening respiratory status, may not be stable to transfer down for CT angiogram, and also she could not be anticoagulated due to her devastating hemorrhagic stroke  Seizures - On Keppra   Acute respiratory failure with hypoxia due to large intracerebral hemorrhage - S/p trach/PEG 1/12. Previously completed 10 days of antibiotic coverage for anterobasilar pneumonia, repeat sputum culture obtained 1/15 positive for  abundant Pseudomonas, abundant Staphylococcus aureus, and moderate Enterobacter, s/p course of Meropenem -Continue ATC as tolerated, hope to avoid vent needs -PRN lasix -Wean O2 down as tolerated -Bronchial hygiene -Appreciate PCCM follow-up, last seen 2/5   Urinary retention -foley placed 1/23, Will need urologic follow up as outpatient   History of chronic sacroiliitis Osteopenia RLS -Supportive care  Scheduled Meds:  Chlorhexidine Gluconate Cloth  6 each Topical Daily   famotidine  20 mg Per Tube QHS   feeding supplement (PROSource TF20)  60 mL Per Tube Daily   Gerhardt's butt cream   Topical QID   latanoprost  1 drop Both Eyes QHS   levETIRAcetam  500 mg Per Tube BID   metoprolol tartrate  12.5 mg Per Tube BID   nutrition supplement (JUVEN)  1 packet Per Tube BID BM   mouth rinse  15 mL Mouth Rinse Q2H   phenylephrine-shark liver oil-mineral oil-petrolatum  1 Application Rectal BID   rosuvastatin  20 mg Per Tube Daily   Continuous Infusions:  sodium chloride Stopped (01/01/23 0424)   feeding supplement (JEVITY 1.2 CAL) 55 mL/hr at 01/17/23 1930   PRN Meds:.acetaminophen **OR** acetaminophen (TYLENOL) oral liquid 160 mg/5 mL **OR** acetaminophen, bisacodyl, docusate, morphine injection, mouth rinse, polyethylene glycol  Current Outpatient Medications  Medication Instructions   latanoprost (XALATAN) 0.005 % ophthalmic solution 1 drop, Both Eyes, Daily at bedtime   oxyCODONE-acetaminophen (PERCOCET/ROXICET) 5-325 MG tablet 1 tablet, Oral, Every 4 hours PRN   pramipexole (MIRAPEX) 0.25 mg, Oral, Nightly    Diet Orders (From admission, onward)     Start  Ordered   12/19/22 0001  Diet NPO time specified  Diet effective midnight        12/18/22 1632            DVT prophylaxis: SCD's Start: 11/29/22 2215   Lab Results  Component Value Date   PLT 268 01/15/2023      Code Status: Full Code  Family Communication: No family at bedside  Status is:  Inpatient  Remains inpatient appropriate because: needs placement  Level of care: Progressive  Consultants:  Neurology Neurosurgery PCCM Palliative care  Objective: Vitals:   01/18/23 0306 01/18/23 0354 01/18/23 0500 01/18/23 0739  BP: (!) 113/57   133/66  Pulse:  (!) 110  (!) 117  Resp:  (!) 29  (!) 25  Temp: 98.9 F (37.2 C)   98.9 F (37.2 C)  TempSrc: Axillary   Oral  SpO2:  95%  95%  Weight:   59.2 kg   Height:        Intake/Output Summary (Last 24 hours) at 01/18/2023 1026 Last data filed at 01/18/2023 [id number] Gross per 24 hour  Intake 4102 ml  Output 1000 ml  Net 3102 ml   Wt Readings from Last 3 Encounters:  01/18/23 59.2 kg    Examination:    General: Appearance:    Well developed, well nourished female in no acute distress     Lungs:      respirations unlabored  Heart:    Tachycardic. Normal rhythm. No murmurs, rubs, or gallops.   MS:   All extremities are intact.   Neurologic:  Not responsive in bed     Data Reviewed: I have independently reviewed following labs and imaging studies   CBC Recent Labs  Lab 01/13/23 0609 01/15/23 0258  WBC 7.8 9.3  HGB 10.4* 10.3*  HCT 34.5* 32.5*  PLT 309 268  MCV 98.9 97.0  MCH 29.8 30.7  MCHC 30.1 31.7  RDW 15.0 15.2    Recent Labs  Lab 01/12/23 1043 01/13/23 0609 01/15/23 0258  NA  --  139 139  K  --  4.2 3.9  CL  --  106 105  CO2  --  27 25  GLUCOSE  --  129* 140*  BUN  --  36* 36*  CREATININE  --  0.47 0.53  CALCIUM  --  8.2* 8.4*  AST  --  48* 42*  ALT  --  39 38  ALKPHOS  --  151* 137*  BILITOT  --  0.1* 0.3  ALBUMIN  --  2.2* 2.1*  MG  --  2.3 2.1  DDIMER 7.95*  --   --     ------------------------------------------------------------------------------------------------------------------ No results for input(s): "CHOL", "HDL", "LDLCALC", "TRIG", "CHOLHDL", "LDLDIRECT" in the last 72 hours.  Lab Results  Component Value Date   HGBA1C 5.5 12/01/2022    ------------------------------------------------------------------------------------------------------------------ No results for input(s): "TSH", "T4TOTAL", "T3FREE", "THYROIDAB" in the last 72 hours.  Invalid input(s): "FREET3"  Cardiac Enzymes No results for input(s): "CKMB", "TROPONINI", "MYOGLOBIN" in the last 168 hours.  Invalid input(s): "CK" ------------------------------------------------------------------------------------------------------------------ No results found for: "BNP"  CBG: Recent Labs  Lab 01/17/23 1109 01/17/23 1527 01/17/23 1950 01/17/23 2351 01/18/23 0737  GLUCAP 137* 142* 131* 127* 121*    No results found for this or any previous visit (from the past 240 hour(s)).    Radiology Studies: No results found.   Eulogio Bear DO [facility]  Between 7 am - 7 pm I am available, please contact me via Amion (  for emergencies) or Securechat (non urgent messages)  Between 7 pm - 7 am I am not available, please contact night coverage MD/APP via Amion

## 2023-01-19 DIAGNOSIS — I619 Nontraumatic intracerebral hemorrhage, unspecified: Secondary | ICD-10-CM | POA: Diagnosis not present

## 2023-01-19 LAB — BASIC METABOLIC PANEL
Anion gap: 10 (ref 5–15)
BUN: 39 mg/dL — ABNORMAL HIGH (ref 8–23)
CO2: 25 mmol/L (ref 22–32)
Calcium: 8.4 mg/dL — ABNORMAL LOW (ref 8.9–10.3)
Chloride: 102 mmol/L (ref 98–111)
Creatinine, Ser: 0.48 mg/dL (ref 0.44–1.00)
GFR, Estimated: >60 mL/min (ref >60)
Glucose, Bld: 135 mg/dL — ABNORMAL HIGH (ref 70–99)
Potassium: 3.6 mmol/L (ref 3.5–5.1)
Sodium: 137 mmol/L (ref 135–145)

## 2023-01-19 LAB — CBC
HCT: 33.6 % — ABNORMAL LOW (ref 36.0–46.0)
Hemoglobin: 10.2 g/dL — ABNORMAL LOW (ref 12.0–15.0)
MCH: 29.6 pg (ref 26.0–34.0)
MCHC: 30.4 g/dL (ref 30.0–36.0)
MCV: 97.4 fL (ref 80.0–100.0)
Platelets: 259 10*3/uL (ref 150–400)
RBC: 3.45 MIL/uL — ABNORMAL LOW (ref 3.87–5.11)
RDW: 15.2 % (ref 11.5–15.5)
WBC: 8.3 10*3/uL (ref 4.0–10.5)
nRBC: 0 % (ref 0.0–0.2)

## 2023-01-19 LAB — GLUCOSE, CAPILLARY: Glucose-Capillary: 139 mg/dL — ABNORMAL HIGH (ref 70–99)

## 2023-01-19 MED ORDER — LOPERAMIDE HCL 2 MG PO CAPS: 2.0000 mg | ORAL_CAPSULE | Freq: Once | ORAL | Status: DC

## 2023-01-19 MED ORDER — LOPERAMIDE HCL 1 MG/7.5ML PO SUSP
2.0000 mg | ORAL | Status: DC | PRN
  Administered 2023-01-19 – 2023-01-21 (×2): 2 mg
  Filled 2023-01-19 (×4): qty 15

## 2023-01-19 NOTE — Progress Notes (Signed)
Pt assessed to be sweaty and her gown had to be changed. Pt assessed to be twitching/tremors mostly to her LUE/LLE; Pt continue to have diarrhea changed x4, MD notified and new orders received. Pt cbg checked to be 139. Pt repositioned in bed and will continue to closely monitor. Francis Gaines Michale Weikel RN   01/19/23 1505  Vitals  Temp 98.5 F (36.9 C)  Temp Source Oral  BP 133/70  MAP (mmHg) 88  BP Location Right Arm  BP Method Automatic  Patient Position (if appropriate) Lying  Pulse Rate (!) 109  Pulse Rate Source Monitor  Resp (!) 29  MEWS COLOR  MEWS Score Color Red  Oxygen Therapy  SpO2 96 %  O2 Device Tracheostomy Collar  MEWS Score  MEWS Temp 0  MEWS Systolic 0  MEWS Pulse 1  MEWS RR 2  MEWS LOC 2  MEWS Score 5

## 2023-01-19 NOTE — Progress Notes (Signed)
PROGRESS NOTE  Pamela Johnston [id number] DOB: 20-Jun-1941 DOA: 11/29/2022 PCP: Pamela Aly, MD   LOS: 51 days   Brief Narrative / Interim history: 82 year old female with history of hypertension who comes into the hospital and is admitted on 11/29/2022 due to decreased responsiveness, aphasia, right-sided weakness.  CT head on admission showed large IPH of left parietal/left temporal regions with extensions into basal ganglia, secondary dissection with IVH and surrounding vasogenic edema with regional mass effect.  She also had several episodes of vomiting with concern for aspiration.  Neurosurgery, neurology were consulted, she was intubated and admitted to neuro ICU.  She was started on 3% hypertonic saline and palliative care was consulted due to poor prognosis.  She eventually underwent tracheostomy as well as PEG placement, and was also started on Keppra due to concern for seizures.  She is currently on trach collar.  Multiple palliative care discussions have been ongoing with the family, at 1 point hospital leadership was involved, she remains full code/full scope.  Earliest she could be placed is a month out from her tracheostomy placement, 2/12.   Significant events: 12/21 - Presented to [facility] ED via EMS as Code Stroke. LKW 2000. Abrupt onset of decreased responsiveness, aphasia and R hemiplegia. Stroke admitting, NSGY consulted. PCCM consult for vent management. Treated with unasyn for aspiration pneumonia. CT Head with large L hemispheric parietal/temporal lobe IPH with extension into basal ganglia and IVH. HTS 3% started.   12/24 - Palliative care consulted, family requested meeting to be delayed 12/25 - Family requested goals of care meeting to be delayed 12/26 - Stopped Unasyn, replete phos; [name] GOC conversation: family says full code 12/27 - Restarted Unasyn for fever, RLL infiltrate; resp culture: enterobacter, MDR. Palliative consulted  12/28 - CT Head unchanged 12/29 - Unasyn  changed to bactrim 12/31 - Posturing, CT head with increased shift, increased bleeding 1/2 - Family fired CVA service after discussion RE poor prognosis  1/4 - Off sedation  1/5 - No acute issues overnight. Remains on vent with increased respirations off sedation. Fever curve slightly decreased  1/6 - Pall care meeting moved to 2pm today. Pamela Johnston the son-in-law will be present. On ven 30%/ On TF. Neo not started last night. MAP > 65 after fluids bolus. Febrile +.  Last CT head 12/09/22.  1/8 - No significant neurologic/clinical change. Brief GOC discussion with husband, Pamela Johnston, at bedside in AM. Concerns from staff re: patient GOC/decisionmaking, want to ensure husband is included in decisions/information sharing. Attempted bedside meeting but family prematurely ended meeting due to escalated discussion. 1/9 - Febrile overnight to 101.61F, remains on bromocriptine. No significant neurologic exam changes. 1/10 Family meeting yesterday with [name], plan for trach/peg though husband says they are still discussing this morning. Per Dt. Hulen Skains " So moving forward, Pamela Johnston is the designated medical decision maker for Pamela Johnston " 1/11 no new issues, plan for PEG tomorrow 1/12 for trach/peg today 1/15 No change in Neuro status , trach and PEG, No weaning , will repeat CT head without, get Case management involved in looking for LTAC ( Day 3 post trach on 1/12)   1/15-CT with some stabilization 1/19 No acute events overnight, respiratory culture 1/15 with abundant Pseudomonas, abundant Staphylococcus aureus, and moderate Enterobacter remains on Meropenem  1/22 trach collar trial 12 + hrs 1/24 trach collar x 24 hours on 35% 1/25- concern for seizures-- keppra started after EEG done  Subjective / 24h Interval events: No over night events  Assesement  and Plan: Principal Problem:   ICH (intracerebral hemorrhage) (HCC) Active Problems:   RLS (restless legs syndrome)   Osteopenia   Sacroiliitis  (HCC)   Benign essential HTN   Aspiration into airway   Hypoxia   Intraparenchymal hemorrhage of brain (HCC)   Brain compression (HCC)   Acute respiratory failure with hypoxia (HCC)   Pneumonia due to Enterobacter aerogenes (HCC)   Goals of care, counseling/discussion   Pressure injury of skin   Tracheostomy status ([place])    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/mass effect Brain compression/ICH Right parietal lobe hemorrhage with associated edema  Severe encephalopathy Intraventricular hemorrhage and mass effect, vasogenic edema, subfalcine herniation -Per prior team members, she is a very poor prognosis.  Multiple family meetings concluded over time with CMO on board, medical decision maker is Pamela Johnston patient's son-in-law. Stroke team signed off. Maintain neuro protective measures. Nutrition and bowel regimen. Aspiration precautions    Fevers -patient had an episode of fever of one 101.7 on 2/1 evening - Continue to closely monitor, chest x-ray showed minimal atelectasis but no clear-cut infiltrates.  Prior respiratory cultures showing Pseudomonas, most recent on 01/03/2023 showing Carbapenem resistance.  She has been treated for pneumonia in the past, monitor off antibiotics, but if needed could use Zerbaxa per ID given resistance.  D-dimer is elevated, but lower extremity ultrasound is negative for DVT.  PE is in the differential however she is not having worsening respiratory status, may not be stable to transfer down for CT angiogram, and also she could not be anticoagulated due to her devastating hemorrhagic stroke  Seizures - On Keppra   Acute respiratory failure with hypoxia due to large intracerebral hemorrhage - S/p trach/PEG 1/12. Previously completed 10 days of antibiotic coverage for anterobasilar pneumonia, repeat sputum culture obtained 1/15 positive for abundant Pseudomonas, abundant Staphylococcus aureus, and moderate  Enterobacter, s/p course of Meropenem -Continue ATC as tolerated, hope to avoid vent needs -PRN lasix -Wean O2 down as tolerated -Bronchial hygiene -Appreciate PCCM follow-up, last seen 2/5   Urinary retention -foley placed 1/23, Will need urologic follow up as outpatient   History of chronic sacroiliitis Osteopenia RLS -Supportive care  Scheduled Meds:  Chlorhexidine Gluconate Cloth  6 each Topical Daily   famotidine  20 mg Per Tube QHS   feeding supplement (PROSource TF20)  60 mL Per Tube Daily   Gerhardt's butt cream   Topical QID   latanoprost  1 drop Both Eyes QHS   levETIRAcetam  500 mg Per Tube BID   metoprolol tartrate  12.5 mg Per Tube BID   nutrition supplement (JUVEN)  1 packet Per Tube BID BM   mouth rinse  15 mL Mouth Rinse Q2H   phenylephrine-shark liver oil-mineral oil-petrolatum  1 Application Rectal BID   rosuvastatin  20 mg Per Tube Daily   Continuous Infusions:  sodium chloride Stopped (01/01/23 0424)   feeding supplement (JEVITY 1.2 CAL) 1,000 mL (01/19/23 0627)   PRN Meds:.acetaminophen **OR** acetaminophen (TYLENOL) oral liquid 160 mg/5 mL **OR** acetaminophen, bisacodyl, docusate, morphine injection, mouth rinse, polyethylene glycol  Current Outpatient Medications  Medication Instructions   latanoprost (XALATAN) 0.005 % ophthalmic solution 1 drop, Both Eyes, Daily at bedtime   oxyCODONE-acetaminophen (PERCOCET/ROXICET) 5-325 MG tablet 1 tablet, Oral, Every 4 hours PRN   pramipexole (MIRAPEX) 0.25 mg, Oral, Nightly    Diet Orders (From admission, onward)     Start     Ordered   12/19/22 0001  Diet NPO time  specified  Diet effective midnight        12/18/22 1632            DVT prophylaxis: SCD's Start: 11/29/22 2215   Lab Results  Component Value Date   PLT 268 01/15/2023      Code Status: Full Code  Family Communication: No family at bedside  Status is: Inpatient  Remains inpatient appropriate because: needs placement  Level of  care: Progressive  Consultants:  Neurology Neurosurgery PCCM Palliative care  Objective: Vitals:   01/19/23 0458 01/19/23 0658 01/19/23 0855 01/19/23 1115  BP:  (!) 128/59 (!) 128/59 (!) 104/52  Pulse:  (!) 116 (!) 122 98  Resp:  (!) 26 (!) 31 (!) 32  Temp:  99.4 F (37.4 C)    TempSrc:  Axillary    SpO2:  95% 93% 92%  Weight: 59.2 kg     Height:        Intake/Output Summary (Last 24 hours) at 01/19/2023 1143 Last data filed at 01/19/2023 [id number] Gross per 24 hour  Intake 1242 ml  Output 800 ml  Net 442 ml   Wt Readings from Last 3 Encounters:  01/19/23 59.2 kg    Examination:    General: Appearance:    elderly female in no acute distress     Lungs:      respirations unlabored  Heart:    Normal heart rate. .   MS:   All extremities are intact.   Neurologic:   Not responsive in bed     Data Reviewed: I have independently reviewed following labs and imaging studies   CBC Recent Labs  Lab 01/13/23 0609 01/15/23 0258  WBC 7.8 9.3  HGB 10.4* 10.3*  HCT 34.5* 32.5*  PLT 309 268  MCV 98.9 97.0  MCH 29.8 30.7  MCHC 30.1 31.7  RDW 15.0 15.2    Recent Labs  Lab 01/13/23 0609 01/15/23 0258  NA 139 139  K 4.2 3.9  CL 106 105  CO2 27 25  GLUCOSE 129* 140*  BUN 36* 36*  CREATININE 0.47 0.53  CALCIUM 8.2* 8.4*  AST 48* 42*  ALT 39 38  ALKPHOS 151* 137*  BILITOT 0.1* 0.3  ALBUMIN 2.2* 2.1*  MG 2.3 2.1    ------------------------------------------------------------------------------------------------------------------ No results for input(s): "CHOL", "HDL", "LDLCALC", "TRIG", "CHOLHDL", "LDLDIRECT" in the last 72 hours.  Lab Results  Component Value Date   HGBA1C 5.5 12/01/2022   ------------------------------------------------------------------------------------------------------------------ No results for input(s): "TSH", "T4TOTAL", "T3FREE", "THYROIDAB" in the last 72 hours.  Invalid input(s): "FREET3"  Cardiac Enzymes No results for  input(s): "CKMB", "TROPONINI", "MYOGLOBIN" in the last 168 hours.  Invalid input(s): "CK" ------------------------------------------------------------------------------------------------------------------ No results found for: "BNP"  CBG: Recent Labs  Lab 01/17/23 1527 01/17/23 1950 01/17/23 2351 01/18/23 0737 01/18/23 1115  GLUCAP 142* 131* 127* 121* 121*    No results found for this or any previous visit (from the past 240 hour(s)).    Radiology Studies: No results found.   Eulogio Bear DO [facility]  Between 7 am - 7 pm I am available, please contact me via Amion (for emergencies) or Securechat (non urgent messages)  Between 7 pm - 7 am I am not available, please contact night coverage MD/APP via Amion

## 2023-01-20 DIAGNOSIS — I619 Nontraumatic intracerebral hemorrhage, unspecified: Secondary | ICD-10-CM | POA: Diagnosis not present

## 2023-01-20 LAB — GLUCOSE, CAPILLARY
Glucose-Capillary: 137 mg/dL — ABNORMAL HIGH (ref 70–99)
Glucose-Capillary: 135 mg/dL — ABNORMAL HIGH (ref 70–99)

## 2023-01-20 NOTE — Progress Notes (Signed)
PROGRESS NOTE  Pamela Johnston [id number] DOB: 1941/04/02 DOA: 11/29/2022 PCP: Pamela Aly, MD   LOS: 75 days   Brief Narrative / Interim history: 82 year old female with history of hypertension who comes into the hospital and is admitted on 11/29/2022 due to decreased responsiveness, aphasia, right-sided weakness.  CT head on admission showed large IPH of left parietal/left temporal regions with extensions into basal ganglia, secondary dissection with IVH and surrounding vasogenic edema with regional mass effect.  She also had several episodes of vomiting with concern for aspiration.  Neurosurgery, neurology were consulted, she was intubated and admitted to neuro ICU.  She was started on 3% hypertonic saline and palliative care was consulted due to poor prognosis.  She eventually underwent tracheostomy as well as PEG placement, and was also started on Keppra due to concern for seizures.  She is currently on trach collar.  Multiple palliative care discussions have been ongoing with the family, at 1 point hospital leadership was involved, she remains full code/full scope.  Earliest she could be placed is a month out from her tracheostomy placement, 2/12.   Significant events: 12/21 - Presented to [facility] ED via EMS as Code Stroke. LKW 2000. Abrupt onset of decreased responsiveness, aphasia and R hemiplegia. Stroke admitting, NSGY consulted. PCCM consult for vent management. Treated with unasyn for aspiration pneumonia. CT Head with large L hemispheric parietal/temporal lobe IPH with extension into basal ganglia and IVH. HTS 3% started.   12/24 - Palliative care consulted, family requested meeting to be delayed 12/25 - Family requested goals of care meeting to be delayed 12/26 - Stopped Unasyn, replete phos; Pamela Johnston conversation: family says full code 12/27 - Restarted Unasyn for fever, RLL infiltrate; resp culture: enterobacter, MDR. Palliative consulted  12/28 - CT Head unchanged 12/29 - Unasyn  changed to bactrim 12/31 - Posturing, CT head with increased shift, increased bleeding 1/2 - Family fired CVA service after discussion RE poor prognosis  1/4 - Off sedation  1/5 - No acute issues overnight. Remains on vent with increased respirations off sedation. Fever curve slightly decreased  1/6 - Pall care meeting moved to 2pm today. Pamela Johnston the son-in-law will be present. On ven 30%/ On TF. Neo not started last night. MAP > 65 after fluids bolus. Febrile +.  Last CT head 12/09/22.  1/8 - No significant neurologic/clinical change. Brief Johnston discussion with husband, Pamela Johnston, at bedside in AM. Concerns from staff re: patient Johnston/decisionmaking, want to ensure husband is included in decisions/information sharing. Attempted bedside meeting but family prematurely ended meeting due to escalated discussion. 1/9 - Febrile overnight to 101.69F, remains on bromocriptine. No significant neurologic exam changes. 1/10 Family meeting yesterday with [name], plan for trach/peg though husband says they are still discussing this morning. Per Dt. Hulen Skains " So moving forward, Pamela Johnston is the designated medical decision maker for Pamela Johnston " 1/11 no new issues, plan for PEG tomorrow 1/12 for trach/peg today 1/15 No change in Neuro status , trach and PEG, No weaning , will repeat CT head without, get Case management involved in looking for LTAC ( Day 3 post trach on 1/12)   1/15-CT with some stabilization 1/19 No acute events overnight, respiratory culture 1/15 with abundant Pseudomonas, abundant Staphylococcus aureus, and moderate Enterobacter remains on Meropenem  1/22 trach collar trial 12 + hrs 1/24 trach collar x 24 hours on 35% 1/25- concern for seizures-- keppra started after EEG done  Subjective / 24h Interval events: No twitching currently  Assesement and  Plan: Principal Problem:   ICH (intracerebral hemorrhage) (HCC) Active Problems:   RLS (restless legs syndrome)   Osteopenia   Sacroiliitis  (HCC)   Benign essential HTN   Aspiration into airway   Hypoxia   Intraparenchymal hemorrhage of brain (HCC)   Brain compression (HCC)   Acute respiratory failure with hypoxia (HCC)   Pneumonia due to Enterobacter aerogenes (HCC)   Goals of care, counseling/discussion   Pressure injury of skin   Tracheostomy status ([place])    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/mass effect Brain compression/ICH Right parietal lobe hemorrhage with associated edema  Severe encephalopathy Intraventricular hemorrhage and mass effect, vasogenic edema, subfalcine herniation -Per prior team members, she is a very poor prognosis.  Multiple family meetings concluded over time with CMO on board, medical decision maker is Pamela Johnston patient's son-in-law. Stroke team signed off. Maintain neuro protective measures. Nutrition and bowel regimen. Aspiration precautions    Fevers -patient had an episode of fever of one 101.7 on 2/1 evening - Continue to closely monitor, chest x-ray showed minimal atelectasis but no clear-cut infiltrates.  Prior respiratory cultures showing Pseudomonas, most recent on 01/03/2023 showing Carbapenem resistance.  She has been treated for pneumonia in the past, monitor off antibiotics, but if needed could use Zerbaxa per ID given resistance.  D-dimer is elevated, but lower extremity ultrasound is negative for DVT.  PE is in the differential however she is not having worsening respiratory status, may not be stable to transfer down for CT angiogram, and also she could not be anticoagulated due to her devastating hemorrhagic stroke  Seizures - On Keppra   Acute respiratory failure with hypoxia due to large intracerebral hemorrhage - S/p trach/PEG 1/12. Previously completed 10 days of antibiotic coverage for anterobasilar pneumonia, repeat sputum culture obtained 1/15 positive for abundant Pseudomonas, abundant Staphylococcus aureus, and moderate  Enterobacter, s/p course of Meropenem -Continue ATC as tolerated, hope to avoid vent needs -PRN lasix -Wean O2 down as tolerated -Bronchial hygiene -Appreciate PCCM follow-up, last seen 2/5   Urinary retention -foley placed 1/23, Will need urologic follow up as outpatient   History of chronic sacroiliitis Osteopenia RLS -Supportive care  Scheduled Meds:  Chlorhexidine Gluconate Cloth  6 each Topical Daily   famotidine  20 mg Per Tube QHS   feeding supplement (PROSource TF20)  60 mL Per Tube Daily   Gerhardt's butt cream   Topical QID   latanoprost  1 drop Both Eyes QHS   levETIRAcetam  500 mg Per Tube BID   metoprolol tartrate  12.5 mg Per Tube BID   nutrition supplement (JUVEN)  1 packet Per Tube BID BM   mouth rinse  15 mL Mouth Rinse Q2H   phenylephrine-shark liver oil-mineral oil-petrolatum  1 Application Rectal BID   rosuvastatin  20 mg Per Tube Daily   Continuous Infusions:  sodium chloride Stopped (01/01/23 0424)   feeding supplement (JEVITY 1.2 CAL) 1,000 mL (01/20/23 0554)   PRN Meds:.acetaminophen **OR** acetaminophen (TYLENOL) oral liquid 160 mg/5 mL **OR** acetaminophen, bisacodyl, docusate, loperamide HCl, morphine injection, mouth rinse, polyethylene glycol  Current Outpatient Medications  Medication Instructions   latanoprost (XALATAN) 0.005 % ophthalmic solution 1 drop, Both Eyes, Daily at bedtime   oxyCODONE-acetaminophen (PERCOCET/ROXICET) 5-325 MG tablet 1 tablet, Oral, Every 4 hours PRN   pramipexole (MIRAPEX) 0.25 mg, Oral, Nightly    Diet Orders (From admission, onward)     Start     Ordered   12/19/22 0001  Diet NPO  time specified  Diet effective midnight        12/18/22 1632            DVT prophylaxis: SCD's Start: 11/29/22 2215   Lab Results  Component Value Date   PLT 259 01/19/2023      Code Status: Full Code  Family Communication: No family at bedside  Status is: Inpatient  Remains inpatient appropriate because: needs  placement  Level of care: Progressive  Consultants:  Neurology Neurosurgery PCCM Palliative care  Objective: Vitals:   01/20/23 0325 01/20/23 0500 01/20/23 0754 01/20/23 0900  BP: (!) 122/58  113/61 (!) (P) 115/50  Pulse: 100  (!) 113 (!) (P) 113  Resp: 20   (!) (P) 28  Temp: 99.5 F (37.5 C)   (P) 98.6 F (37 C)  TempSrc: Axillary   (P) Oral  SpO2: 92%  96% (P) 96%  Weight:  59.2 kg    Height:        Intake/Output Summary (Last 24 hours) at 01/20/2023 1029 Last data filed at 01/19/2023 1814 Gross per 24 hour  Intake 1255.8 ml  Output 703 ml  Net 552.8 ml   Wt Readings from Last 3 Encounters:  01/20/23 59.2 kg    Examination:    General: Appearance:    elderly female in no acute distress     Lungs:      respirations unlabored  Heart:    Tachycardic. .   MS:   All extremities are intact.   Neurologic:   Not responsive in bed     Data Reviewed: I have independently reviewed following labs and imaging studies   CBC Recent Labs  Lab 01/15/23 0258 01/19/23 1616  WBC 9.3 8.3  HGB 10.3* 10.2*  HCT 32.5* 33.6*  PLT 268 259  MCV 97.0 97.4  MCH 30.7 29.6  MCHC 31.7 30.4  RDW 15.2 15.2    Recent Labs  Lab 01/15/23 0258 01/19/23 1616  NA 139 137  K 3.9 3.6  CL 105 102  CO2 25 25  GLUCOSE 140* 135*  BUN 36* 39*  CREATININE 0.53 0.48  CALCIUM 8.4* 8.4*  AST 42*  --   ALT 38  --   ALKPHOS 137*  --   BILITOT 0.3  --   ALBUMIN 2.1*  --   MG 2.1  --     ------------------------------------------------------------------------------------------------------------------ No results for input(s): "CHOL", "HDL", "LDLCALC", "TRIG", "CHOLHDL", "LDLDIRECT" in the last 72 hours.  Lab Results  Component Value Date   HGBA1C 5.5 12/01/2022   ------------------------------------------------------------------------------------------------------------------ No results for input(s): "TSH", "T4TOTAL", "T3FREE", "THYROIDAB" in the last 72 hours.  Invalid  input(s): "FREET3"  Cardiac Enzymes No results for input(s): "CKMB", "TROPONINI", "MYOGLOBIN" in the last 168 hours.  Invalid input(s): "CK" ------------------------------------------------------------------------------------------------------------------ No results found for: "BNP"  CBG: Recent Labs  Lab 01/17/23 1950 01/17/23 2351 01/18/23 0737 01/18/23 1115 01/19/23 1557  GLUCAP 131* 127* 121* 121* 139*    No results found for this or any previous visit (from the past 240 hour(s)).    Radiology Studies: No results found.   Eulogio Bear DO [facility]  Between 7 am - 7 pm I am available, please contact me via Amion (for emergencies) or Securechat (non urgent messages)  Between 7 pm - 7 am I am not available, please contact night coverage MD/APP via Amion

## 2023-01-21 ENCOUNTER — Inpatient Hospital Stay (HOSPITAL_COMMUNITY): Payer: Medicare PPO

## 2023-01-21 DIAGNOSIS — I612 Nontraumatic intracerebral hemorrhage in hemisphere, unspecified: Secondary | ICD-10-CM | POA: Diagnosis not present

## 2023-01-21 DIAGNOSIS — R569 Unspecified convulsions: Secondary | ICD-10-CM | POA: Diagnosis not present

## 2023-01-21 DIAGNOSIS — R0902 Hypoxemia: Secondary | ICD-10-CM | POA: Diagnosis not present

## 2023-01-21 DIAGNOSIS — R251 Tremor, unspecified: Secondary | ICD-10-CM

## 2023-01-21 DIAGNOSIS — I619 Nontraumatic intracerebral hemorrhage, unspecified: Secondary | ICD-10-CM | POA: Diagnosis not present

## 2023-01-21 LAB — GLUCOSE, CAPILLARY
Glucose-Capillary: 138 mg/dL — ABNORMAL HIGH (ref 70–99)
Glucose-Capillary: 130 mg/dL — ABNORMAL HIGH (ref 70–99)
Glucose-Capillary: 152 mg/dL — ABNORMAL HIGH (ref 70–99)

## 2023-01-21 MED ORDER — POTASSIUM CHLORIDE 20 MEQ PO PACK
40.0000 meq | PACK | Freq: Once | ORAL | Status: AC
  Administered 2023-01-21: 40 meq
  Filled 2023-01-21: qty 2

## 2023-01-21 NOTE — NC FL2 (Signed)
[place] LEVEL OF CARE FORM     IDENTIFICATION  Patient Name: Pamela Johnston Birthdate: 06/22/1941 Sex: female Admission Date (Current Location): 11/29/2022  [facility] and [place] Number:  Herbalist and Address:  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   Pressure injury of skin 01/06/2023   Pneumonia due to Enterobacter aerogenes ([place]) 12/12/2022   Goals of care, counseling/discussion 12/12/2022   Brain compression ([place]) 12/11/2022   Acute respiratory failure with hypoxia ([place]) 12/11/2022   Aspiration into airway 11/30/2022   Hypoxia 11/30/2022   Intraparenchymal hemorrhage of brain ([place]) 11/30/2022   ICH (intracerebral hemorrhage) ([place]) 11/29/2022   RLS (restless legs syndrome) 11/29/2022   Osteopenia 11/29/2022   Sacroiliitis ([place]) 11/29/2022   Benign essential HTN 11/29/2022    Orientation RESPIRATION BLADDER Height & Weight      (does not follow commands)  Tracheostomy, Vent Continent, Indwelling catheter Weight: 130 lb 8.2 oz (59.2 kg) Height:  5' 1"$  (154.9 cm)  BEHAVIORAL SYMPTOMS/MOOD NEUROLOGICAL BOWEL NUTRITION STATUS      Incontinent (rectal pouch) Feeding tube (Vital AF 1.2 CAL)  AMBULATORY STATUS COMMUNICATION OF NEEDS Skin   Total Care Does not communicate PU Stage and Appropriate Care (Stage II on ear and nose)                       Personal Care Assistance Level of Assistance  Bathing, Feeding, Dressing, Total care Bathing Assistance: Maximum  assistance Feeding assistance: Maximum assistance Dressing Assistance: Maximum assistance Total Care Assistance: Maximum assistance   Functional Limitations Info   (intubated)          SPECIAL CARE FACTORS FREQUENCY                       Contractures Contractures Info: Not present    Additional Factors Info  Code Status, Allergies Code Status Info: Full Allergies Info: Nickel, Penicillin G           Current Medications (01/21/2023):  This is the current hospital active medication list Current Facility-Administered Medications  Medication Dose Route Frequency Provider Last Rate Last Admin   0.9 %  sodium chloride infusion  250 mL Intravenous Continuous Omar Person, NP   Stopped at 01/01/23 0424   acetaminophen (TYLENOL) tablet 650 mg  650 mg Oral Q4H PRN Greta Doom, MD       Or   acetaminophen (TYLENOL) 160 MG/5ML solution 650 mg  650 mg Per Tube Q4H PRN Greta Doom, MD   650 mg at 01/20/23 0900   Or   acetaminophen (TYLENOL) suppository 650 mg  650 mg Rectal Q4H PRN Greta Doom, MD       bisacodyl (DULCOLAX) suppository 10 mg  10 mg Rectal Daily PRN Vann, Jessica U, DO       Chlorhexidine Gluconate Cloth 2 % PADS 6 each  6 each Topical Daily Greta Doom, MD   6 each at  01/20/23 2122   docusate (COLACE) 50 MG/5ML liquid 100 mg  100 mg Per Tube BID PRN Olalere, Adewale A, MD       famotidine (PEPCID) 40 MG/5ML suspension 20 mg  20 mg Per Tube QHS Simonne Maffucci B, MD   20 mg at 01/20/23 2121   feeding supplement (JEVITY 1.2 CAL) liquid 1,000 mL  1,000 mL Per Tube Continuous Hunsucker, Bonna Gains, MD 55 mL/hr at 01/21/23 0433 1,000 mL at 01/21/23 0433   feeding supplement (PROSource TF20) liquid 60 mL  60 mL Per Tube Daily Olalere, Adewale A, MD   60 mL at 01/21/23 0935   Gerhardt's butt cream   Topical QID Eulogio Bear U, DO   Given at 01/21/23 0937   latanoprost (XALATAN) 0.005 % ophthalmic solution 1 drop  1 drop  Both Eyes QHS Rosalin Hawking, MD   1 drop at 01/20/23 2122   levETIRAcetam (KEPPRA) tablet 500 mg  500 mg Per Tube BID Eulogio Bear U, DO   500 mg at 01/21/23 0931   loperamide HCl (IMODIUM) 1 MG/7.5ML suspension 2 mg  2 mg Per Tube PRN Eulogio Bear U, DO   2 mg at 01/21/23 0940   metoprolol tartrate (LOPRESSOR) tablet 12.5 mg  12.5 mg Per Tube BID Eliseo Squires, Jessica U, DO   12.5 mg at 01/21/23 0931   morphine (PF) 2 MG/ML injection 1 mg  1 mg Intravenous Q3H PRN Eulogio Bear U, DO   1 mg at 01/21/23 [id number]   nutrition supplement (JUVEN) (JUVEN) powder packet 1 packet  1 packet Per Tube BID BM Hunsucker, Bonna Gains, MD   1 packet at 01/21/23 0935   Oral care mouth rinse  15 mL Mouth Rinse Q2H Greta Doom, MD   15 mL at 01/21/23 [id number]   Oral care mouth rinse  15 mL Mouth Rinse PRN Greta Doom, MD       phenylephrine-shark liver oil-mineral oil-petrolatum (PREPARATION H) rectal ointment 1 Application  1 Application Rectal BID Eulogio Bear U, DO   1 Application at [id number]   polyethylene glycol (MIRALAX / GLYCOLAX) packet 17 g  17 g Per Tube Daily PRN Olalere, Adewale A, MD       rosuvastatin (CRESTOR) tablet 20 mg  20 mg Per Tube Daily Rosalin Hawking, MD   20 mg at 01/21/23 [id number]     Discharge Medications: Please see discharge summary for a list of discharge medications.  Relevant Imaging Results:  Relevant Lab Results:   Additional Information SSN: [id number]  [place], [place]

## 2023-01-21 NOTE — Progress Notes (Signed)
PROGRESS NOTE  Pamela Johnston [id number] DOB: Nov 19, 1941 DOA: 11/29/2022 PCP: [name], MD   LOS: 6 days   Brief Narrative / Interim history: 82 year old female with history of hypertension who comes into the hospital and is admitted on 11/29/2022 due to decreased responsiveness, aphasia, right-sided weakness.  CT head on admission showed large IPH of left parietal/left temporal regions with extensions into basal ganglia, secondary dissection with IVH and surrounding vasogenic edema with regional mass effect.  She also had several episodes of vomiting with concern for aspiration.  Neurosurgery, neurology were consulted, she was intubated and admitted to neuro ICU.  She was started on 3% hypertonic saline and palliative care was consulted due to poor prognosis.  She eventually underwent tracheostomy as well as PEG placement, and was also started on Keppra due to concern for seizures.  She is currently on trach collar.  Multiple palliative care discussions have been ongoing with the family, at 1 point hospital leadership was involved, she remains full code/full scope.  Earliest she could be placed is a month out from her tracheostomy placement, 2/12.   Significant events: 12/21 - Presented to [facility] ED via EMS as Code Stroke. LKW 2000. Abrupt onset of decreased responsiveness, aphasia and R hemiplegia. Stroke admitting, NSGY consulted. PCCM consult for vent management. Treated with unasyn for aspiration pneumonia. CT Head with large L hemispheric parietal/temporal lobe IPH with extension into basal ganglia and IVH. HTS 3% started.   12/24 - Palliative care consulted, family requested meeting to be delayed 12/25 - Family requested goals of care meeting to be delayed 12/26 - Stopped Unasyn, replete phos; [name] GOC conversation: family says full code 12/27 - Restarted Unasyn for fever, RLL infiltrate; resp culture: enterobacter, MDR. Palliative consulted  12/28 - CT Head unchanged 12/29 - Unasyn  changed to bactrim 12/31 - Posturing, CT head with increased shift, increased bleeding 1/2 - Family fired CVA service after discussion RE poor prognosis  1/4 - Off sedation  1/5 - No acute issues overnight. Remains on vent with increased respirations off sedation. Fever curve slightly decreased  1/6 - Pall care meeting moved to 2pm today. [name] the son-in-law will be present. On ven 30%/ On TF. Neo not started last night. MAP > 65 after fluids bolus. Febrile +.  Last CT head 12/09/22.  1/8 - No significant neurologic/clinical change. Brief GOC discussion with husband, Pamela Johnston, at bedside in AM. Concerns from staff re: patient GOC/decisionmaking, want to ensure husband is included in decisions/information sharing. Attempted bedside meeting but family prematurely ended meeting due to escalated discussion. 1/9 - Febrile overnight to 101.19F, remains on bromocriptine. No significant neurologic exam changes. 1/10 Family meeting yesterday with [name], plan for trach/peg though husband says they are still discussing this morning. Per Dt. Hulen Skains " So moving forward, Pamela Johnston is the designated medical decision maker for Pamela Johnston " 1/11 no new issues, plan for PEG tomorrow 1/12 for trach/peg today 1/15 No change in Neuro status , trach and PEG, No weaning , will repeat CT head without, get Case management involved in looking for LTAC ( Day 3 post trach on 1/12)   1/15-CT with some stabilization 1/19 No acute events overnight, respiratory culture 1/15 with abundant Pseudomonas, abundant Staphylococcus aureus, and moderate Enterobacter remains on Meropenem  1/22 trach collar trial 12 + hrs 1/24 trach collar x 24 hours on 35% 1/25- concern for seizures-- keppra started after EEG done 2/12- concern for seizures-- EEG reordered  Subjective / 24h Interval events:  Having some twitching/abnormal muscle movement  Assesement and Plan: Principal Problem:   ICH (intracerebral hemorrhage) (HCC) Active  Problems:   RLS (restless legs syndrome)   Osteopenia   Sacroiliitis (HCC)   Benign essential HTN   Aspiration into airway   Hypoxia   Intraparenchymal hemorrhage of brain (HCC)   Brain compression (HCC)   Acute respiratory failure with hypoxia (HCC)   Pneumonia due to Enterobacter aerogenes (HCC)   Goals of care, counseling/discussion   Pressure injury of skin   Tracheostomy status ([place])    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/mass effect Brain compression/ICH Right parietal lobe hemorrhage with associated edema  Severe encephalopathy Intraventricular hemorrhage and mass effect, vasogenic edema, subfalcine herniation -Per prior team members, she is a very poor prognosis.  Multiple family meetings concluded over time with CMO on board, medical decision maker is [name] patient's son-in-law. Stroke team signed off. Maintain neuro protective measures. Nutrition and bowel regimen. Aspiration precautions    Fevers -patient had an episode of fever of one 101.7 on 2/1 evening - Continue to closely monitor, chest x-ray showed minimal atelectasis but no clear-cut infiltrates.  Prior respiratory cultures showing Pseudomonas, most recent on 01/03/2023 showing Carbapenem resistance.  She has been treated for pneumonia in the past, monitor off antibiotics, but if needed could use Zerbaxa per ID given resistance.  D-dimer is elevated, but lower extremity ultrasound is negative for DVT.  PE is in the differential however she is not having worsening respiratory status, may not be stable to transfer down for CT angiogram, and also she could not be anticoagulated due to her devastating hemorrhagic stroke  Seizures - On Keppra -repeat EEG 2/12 -neurology re consult    Acute respiratory failure with hypoxia due to large intracerebral hemorrhage - S/p trach/PEG 1/12. Previously completed 10 days of antibiotic coverage for anterobasilar pneumonia, repeat  sputum culture obtained 1/15 positive for abundant Pseudomonas, abundant Staphylococcus aureus, and moderate Enterobacter, s/p course of Meropenem -Continue ATC as tolerated, hope to avoid vent needs -PRN lasix -Wean O2 down as tolerated -Bronchial hygiene -Appreciate PCCM follow-up, last seen 2/5   Urinary retention -foley placed 1/23, Will need urologic follow up as outpatient   History of chronic sacroiliitis Osteopenia RLS -Supportive care  Scheduled Meds:  Chlorhexidine Gluconate Cloth  6 each Topical Daily   famotidine  20 mg Per Tube QHS   feeding supplement (PROSource TF20)  60 mL Per Tube Daily   Gerhardt's butt cream   Topical QID   latanoprost  1 drop Both Eyes QHS   levETIRAcetam  500 mg Per Tube BID   metoprolol tartrate  12.5 mg Per Tube BID   nutrition supplement (JUVEN)  1 packet Per Tube BID BM   mouth rinse  15 mL Mouth Rinse Q2H   phenylephrine-shark liver oil-mineral oil-petrolatum  1 Application Rectal BID   potassium chloride  40 mEq Per Tube Once   rosuvastatin  20 mg Per Tube Daily   Continuous Infusions:  sodium chloride Stopped (01/01/23 0424)   feeding supplement (JEVITY 1.2 CAL) 1,000 mL (01/21/23 0433)   PRN Meds:.acetaminophen **OR** acetaminophen (TYLENOL) oral liquid 160 mg/5 mL **OR** acetaminophen, bisacodyl, docusate, loperamide HCl, morphine injection, mouth rinse, polyethylene glycol  Current Outpatient Medications  Medication Instructions   latanoprost (XALATAN) 0.005 % ophthalmic solution 1 drop, Both Eyes, Daily at bedtime   oxyCODONE-acetaminophen (PERCOCET/ROXICET) 5-325 MG tablet 1 tablet, Oral, Every 4 hours PRN   pramipexole (MIRAPEX) 0.25 mg, Oral, Nightly  Diet Orders (From admission, onward)     Start     Ordered   12/19/22 0001  Diet NPO time specified  Diet effective midnight        12/18/22 1632            DVT prophylaxis: SCD's Start: 11/29/22 2215   Lab Results  Component Value Date   PLT 259 01/19/2023       Code Status: Full Code  Family Communication: LM for son 2/12  Status is: Inpatient  Remains inpatient appropriate because: needs placement  Level of care: Progressive  Consultants:  Neurology Neurosurgery PCCM Palliative care  Objective: Vitals:   01/21/23 0736 01/21/23 0759 01/21/23 0813 01/21/23 0931  BP: (!) 122/45 (!) 116/46 (!) 116/46 123/61  Pulse:  (!) 126 (!) 113 (!) 109  Resp:  17 (!) 24   Temp: 98.9 F (37.2 C)  98.7 F (37.1 C)   TempSrc: Axillary  Oral   SpO2:  95% 95%   Weight:      Height:        Intake/Output Summary (Last 24 hours) at 01/21/2023 1143 Last data filed at 01/21/2023 [id number] Gross per 24 hour  Intake 2039 ml  Output 1550 ml  Net 489 ml   Wt Readings from Last 3 Encounters:  01/21/23 59.2 kg    Examination:    General: Appearance:    female in no acute distress     Lungs:     Clear to auscultation bilaterally, respirations unlabored  Heart:    Tachycardic. Normal rhythm. No murmurs, rubs, or gallops.   MS:   continuous rhythmic movements of both the left arm and leg   Neurologic:   Not responsive in bed, not following commands      Data Reviewed: I have independently reviewed following labs and imaging studies   CBC Recent Labs  Lab 01/15/23 0258 01/19/23 1616  WBC 9.3 8.3  HGB 10.3* 10.2*  HCT 32.5* 33.6*  PLT 268 259  MCV 97.0 97.4  MCH 30.7 29.6  MCHC 31.7 30.4  RDW 15.2 15.2    Recent Labs  Lab 01/15/23 0258 01/19/23 1616  NA 139 137  K 3.9 3.6  CL 105 102  CO2 25 25  GLUCOSE 140* 135*  BUN 36* 39*  CREATININE 0.53 0.48  CALCIUM 8.4* 8.4*  AST 42*  --   ALT 38  --   ALKPHOS 137*  --   BILITOT 0.3  --   ALBUMIN 2.1*  --   MG 2.1  --     ------------------------------------------------------------------------------------------------------------------ No results for input(s): "CHOL", "HDL", "LDLCALC", "TRIG", "CHOLHDL", "LDLDIRECT" in the last 72 hours.  Lab Results  Component Value Date    HGBA1C 5.5 12/01/2022   ------------------------------------------------------------------------------------------------------------------ No results for input(s): "TSH", "T4TOTAL", "T3FREE", "THYROIDAB" in the last 72 hours.  Invalid input(s): "FREET3"  Cardiac Enzymes No results for input(s): "CKMB", "TROPONINI", "MYOGLOBIN" in the last 168 hours.  Invalid input(s): "CK" ------------------------------------------------------------------------------------------------------------------ No results found for: "BNP"  CBG: Recent Labs  Lab 01/18/23 1115 01/19/23 1557 01/20/23 1638 01/20/23 1954 01/21/23 1021  GLUCAP 121* 139* 137* 135* 138*    No results found for this or any previous visit (from the past 240 hour(s)).    Radiology Studies: No results found.   Eulogio Bear DO [facility]  Between 7 am - 7 pm I am available, please contact me via Amion (for emergencies) or Securechat (non urgent messages)  Between 7 pm - 7 am I am  not available, please contact night coverage MD/APP via Amion

## 2023-01-21 NOTE — Progress Notes (Signed)
Subjective: She was noticed to have some left-sided abnormal movements for which neurology was reconsulted  Exam: Vitals:   01/21/23 0813 01/21/23 0931  BP: (!) 116/46 123/61  Pulse: (!) 113 (!) 109  Resp: (!) 24   Temp: 98.7 F (37.1 C)   SpO2: 95%    Gen: In bed, NAD Resp: non-labored breathing, no acute distress Abd: soft, nt  Neuro: MS: Eyes open, she does not follow commands, does not fixate or track. CN: Eyes are relatively midline, does not fixate or track, no nystagmus.  She has right facial weakness Motor: No movement right upper extremity, she does minimally flex to nailbed stimulation in the right lower extremity.  She has continuous rhythmic movements of both the left arm and leg, this has the character more of clonus, or possibly even parkinsonian tremor as opposed to seizure.  It does seem to be modifiable with moving the arm, but I am not able to elicit it as would be expected from clonus. Sensory: As above  Pertinent Labs: Creatinine 0.48  Impression: 82 year old female with very large intraparenchymal hematoma presented on 12/21 who is developed left-sided rhythmic movements.  Seizure is a possibility, but the character of the movements is not typical for seizure and therefore I would like to perform an EEG to capture these movements.  If this is negative, I would consider starting low-dose Sinemet, though it is not a typical appearance for parkinsonian tremor either.  Recommendations: 1) stat EEG to capture movements 2) If not captured, would favor LTM EEG to try to capture them 3) If not seizure, would consider trialing low dose sinemet or amantadine  Roland Rack, MD [facility] [phone]  If 7pm- 7am, please page neurology on call as listed in [place].

## 2023-01-21 NOTE — Progress Notes (Addendum)
NAME:  Pamela Johnston, MRN:  [id number], DOB:  [birth year], LOS: 52 ADMISSION DATE:  11/29/2022, CONSULTATION DATE:  11/29/2022 REFERRING MD:  Leonel Ramsay - Neuro REASON FOR CONSULT: Ventilator management in setting of large IPH  History of Present Illness:  81-yer-old woman who presented to [facility] ED 12/21 as a Code Stroke. LKW 2000 when patient reportedly was in bed and became less responsive with +aphasia and R-sided weakness. One episode of nausea/vomiting en route with EMS. PMHx significant for HTN, chronic sacroiliitis (managed with Percocet), RLS, osteopenia.   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 Labs were grossly unremarkable with WBC mildly elevated to 12 and mild hyperglycemia to 154. Decision was made to intubate patient in ED after several episodes of emesis with concern for aspiration. HTS 3% was initiated.   Stroke service to admit. NSGY consulted with no plan for intervention at this time.   PCCM consulted for ventilator/hemodynamic management.   Pertinent Medical History:  Hypertension Sacroilititis Restless legs syndrome Osteopenia  Significant Hospital Events: Including procedures, antibiotic start and stop dates in addition to other pertinent events   12/21 - Presented to [facility] ED via EMS as Code Stroke. LKW 2000. Abrupt onset of decreased responsiveness, aphasia and R hemiplegia. Stroke admitting, NSGY consulted. PCCM consult for vent management. Treated with unasyn for aspiration pneumonia. CT Head with large L hemispheric parietal/temporal lobe IPH with extension into basal ganglia and IVH. HTS 3% started.   12/24 - Palliative care consulted, family requested meeting to be delayed 12/25 - Family requested goals of care meeting to be delayed 12/26 - Stopped  Unasyn, replete phos; [name] GOC conversation: family says full code 12/27 - Restarted Unasyn for fever, RLL infiltrate; resp culture: enterobacter, MDR. Palliative consulted  12/28 - CT Head unchanged 12/29 - Unasyn changed to bactrim 12/31 - Posturing, CT head with increased shift, increased bleeding 1/2 - Family fired CVA service after discussion RE poor prognosis  1/4 - Off sedation  1/5 - No acute issues overnight. Remains on vent with increased respirations off sedation. Fever curve slightly decreased  1/6 - Pall care meeting moved to 2pm today. [name] the son-in-law will be present. On ven 30%/ On TF. Neo not started last night. MAP > 65 after fluids bolus. Febrile +.  Last CT head 12/09/22.  1/8 - No significant neurologic/clinical change. Brief GOC discussion with husband, [name], at bedside in AM. Concerns from staff re: patient GOC/decisionmaking, want to ensure husband is included in decisions/information sharing. Attempted bedside meeting but family prematurely ended meeting due to escalated discussion. 1/9 - Febrile overnight to 101.35F, remains on bromocriptine. No significant neurologic exam changes. 1/10 Family meeting yesterday with [name], plan for trach/peg though husband says they are still discussing this morning. Per Dt. Hulen Skains " So moving forward, Pamela Johnston is the designated medical decision maker for Pamela Johnston " 1/11 no new issues, plan for PEG tomorrow 1/12 for trach/peg today 1/15 No change in Neuro status , trach and PEG, No weaning , will repeat CT head without, get Case management involved in looking for LTAC ( Day 3 post trach on 1/12)   1/15-CT with some stabilization 1/19 No acute events overnight, respiratory culture 1/15 with abundant Pseudomonas, abundant Staphylococcus aureus, and moderate Enterobacter remains on Meropenem  1/22 trach collar trial 12 +  hrs 1/24 trach collar x 24 hours on 35% 1/29 order placed to change trach to cuffless  2/12 No issues  on ATC collar with room air not oxygen   Interim History / Subjective:  Unresponsive but appears comfortable   Objective:   Blood pressure 123/61, pulse (!) 109, temperature 98.7 F (37.1 C), temperature source Oral, resp. rate (!) 24, height 5' 1"$  (1.549 m), weight 59.2 kg, SpO2 95 %.    FiO2 (%):  [21 %] 21 %   Intake/Output Summary (Last 24 hours) at 01/21/2023 1040 Last data filed at 01/21/2023 [id number] Gross per 24 hour  Intake 2039 ml  Output 1550 ml  Net 489 ml    Filed Weights   01/19/23 0458 01/20/23 0500 01/21/23 0500  Weight: 59.2 kg 59.2 kg 59.2 kg   Physical Exam: General: Acute on chronically ill appearing unresponsive  female lying in bed, in NAD HEENT: ETT, MM pink/moist, PERRL,  Neuro: Unresponsive  CV: s1s2 regular rate and rhythm, no murmur, rubs, or gallops,  PULM:  6 cuffless trach midline, no increased work of breathing, no added breath sounds  GI: soft, bowel sounds active in all 4 quadrants, non-tender, non-distended, tolerating TF Extremities: warm/dry, no edema  Skin: no rashes or lesions  Resolved Hospital Problem List:  Hypotension Hyperglycemia  Enterobacter pneumonia -Completed 10 days of treatment for  Circulatory shock  Assessment & Plan:   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/mass effect Brain compression/ICH Right parietal lobe hemorrhage with associated edema  Severe encephalopathy Intraventricular hemorrhage and mass effect, vasogenic edema, subfalcine herniation 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  She does not need a cuffed tracheostomy.  Plan Continue routine trach care  Continue ATC with RA   PCCM will continue to follow weekly for trach care    Best Practice (right click and "Reselect all SmartList Selections" daily)  Per primary   Critical care: N/A  Derrica Sieg D. Harris, NP-C [facility] Personal contact information can be found on Amion  If no contact or response made please call [phone] 01/21/2023, 10:42 AM

## 2023-01-21 NOTE — Progress Notes (Signed)
LTM EEG hooked up and running - no initial skin breakdown - push button tested - [facility] monitoring.  

## 2023-01-21 NOTE — Progress Notes (Signed)
EEG complete - results pending 

## 2023-01-21 NOTE — Procedures (Signed)
Patient Name: DOAA TOWE  MRN: [id number]  Epilepsy Attending: Lora Havens  Referring Physician/Provider: Geradine Girt, DO  Date: 01/21/2023 Duration: 21.23 mins  Patient history:  82 year old female with very large intraparenchymal hematoma presented on 12/21 who is developed left-sided rhythmic movements.  EEG to evaluate for seizure  Level of alertness: lethargic   AEDs during EEG study: LEV  Technical aspects: This EEG study was done with scalp electrodes positioned according to the 10-20 International system of electrode placement. Electrical activity was reviewed with band pass filter of 1-70Hz$ , sensitivity of 7 uV/mm, display speed of 17m/sec with a 60Hz$  notched filter applied as appropriate. EEG data were recorded continuously and digitally stored.  Video monitoring was available and reviewed as appropriate.  Description: No clear posterior dominant rhythm was seen. EEG showed continuous generalized and maximal left hemisphere 3 to 6 Hz theta-delta slowing. Abundant sharp waves were noted in right frontal region, at times qasi periodic at 1hz$  and rhythmic. Hyperventilation and photic stimulation were not performed.      ABNORMALITY - Sharp wave, right frontal - Continuous slow, generalized and maximal left hemisphere   IMPRESSION: This study showed evidence of epileptogenicity arising from right frontal region. Additionally there is cortical dysfunction arising from left hemisphere likely secondary to underlying structural abnormality. Lastly there is moderate diffuse encephalopathy, nonspecific etiology. No seizures were seen throughout the recording.    Latonya Knight [name]

## 2023-01-22 DIAGNOSIS — R258 Other abnormal involuntary movements: Secondary | ICD-10-CM | POA: Diagnosis not present

## 2023-01-22 DIAGNOSIS — R569 Unspecified convulsions: Secondary | ICD-10-CM | POA: Diagnosis not present

## 2023-01-22 DIAGNOSIS — I619 Nontraumatic intracerebral hemorrhage, unspecified: Secondary | ICD-10-CM | POA: Diagnosis not present

## 2023-01-22 MED ORDER — BANATROL TF EN LIQD
60.0000 mL | Freq: Two times a day (BID) | ENTERAL | Status: DC
  Administered 2023-01-22 – 2023-02-27 (×73): 60 mL
  Filled 2023-01-22 (×73): qty 60

## 2023-01-22 NOTE — Progress Notes (Signed)
vLTM maintenance   All impedances below 10kohms.  No skin breakdown noted at all skin sites 

## 2023-01-22 NOTE — Progress Notes (Signed)
Physical Therapy Treatment Patient Details Name: Pamela Johnston MRN: [id number] DOB: September 30, 1941 Today's Date: 01/22/2023   History of Present Illness 76-yer-old woman who presented to [facility] ED 12/21 as a Code Stroke with +aphasia and R-sided weakness. 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 Pt underwent trach and peg on 12/19/22. PMHx significant for HTN, chronic sacroiliitis (managed with Percocet), RLS, osteopenia.    PT Comments    Patient with some R eye opening with stimulation for rolling, with some UE ROM on R mostly with pain behaviors (grimacing) esp with hand/finger movements.  She is contracted with L knee flexion, R UE shoulder IR/ADD/EXT and with elbow ext and pronation as well as finger flexion.  Positioned to work out of posturing today.  Skin looks good except on her buttocks which RN applied cream.  She seemed to initiate some communication efforts (with encouragement) with oral movements lips and tongue in response to the evident pain in her R hand with ROM.  Arousal limited with mostly with eyes closed throughout the session.  VSS throughout on trach collar.  PT goals downgraded today.  Will continue on 1x/week per MD/family for cognitive stim and mobility as tolerated.  Recommendations for follow up therapy are one component of a multi-disciplinary discharge planning process, led by the attending physician.  Recommendations may be updated based on patient status, additional functional criteria and insurance authorization.  Follow Up Recommendations  Long-term institutional care without follow-up therapy Can patient physically be transported by private vehicle: No   Assistance Recommended at Discharge Frequent or constant Supervision/Assistance  Patient can return home with the following Two people to help with walking and/or transfers;Two people to help with  bathing/dressing/bathroom;Assistance with feeding;Assist for transportation;Direct supervision/assist for medications management   Equipment Recommendations  None recommended by PT    Recommendations for Other Services       Precautions / Restrictions Precautions Precautions: Fall Precaution Comments: trach, PEG     Mobility  Bed Mobility Overal bed mobility: Needs Assistance Bed Mobility: Rolling, Supine to Sit Rolling: Total assist, +2 for physical assistance   Supine to sit: Total assist     General bed mobility comments: rolling with nursing for perineal hygiene and bed pad change; up in chair position to sit and positioned with pillows for posture, decreased R head rotation and more neutral spine at neck    Transfers                        Ambulation/Gait                   Stairs             Wheelchair Mobility    Modified Rankin (Stroke Patients Only) Modified Rankin (Stroke Patients Only) Pre-Morbid Rankin Score: Moderately severe disability Modified Rankin: Severe disability     Balance                                            Cognition Arousal/Alertness: Lethargic Behavior During Therapy: Flat affect Overall Cognitive Status: Impaired/Different from baseline  General Comments: opens R eye with stimulation such as rolling or scooting up in bed or with ROM of some extremtities; flexed on L, extension on R, moving lips and tounge when grimacing about ROM of R hand and cue to verbalize pain        Exercises Other Exercises Other Exercises: PROM x4 extremities in supine noted limitations in L knee extension, L hip abduction/ER and R shoulder flexion/ER/ABD more than opposite extremities.    General Comments General comments (skin integrity, edema, etc.): noted some redness on buttocks and RN applied cream after cleansing.      Pertinent Vitals/Pain Pain  Assessment Pain Assessment: Faces Faces Pain Scale: Hurts little more Pain Location: grimacing with R hand ROM and with trach suctioning Pain Descriptors / Indicators: Grimacing Pain Intervention(s): Monitored during session    Home Living                          Prior Function            PT Goals (current goals can now be found in the care plan section) Acute Rehab PT Goals Patient Stated Goal: unable to state PT Goal Formulation: Patient unable to participate in goal setting Time For Goal Achievement: 02/06/23 Potential to Achieve Goals: Poor Progress towards PT goals: Not progressing toward goals - comment;Goals downgraded-see care plan    Frequency    Min 1X/week      PT Plan Current plan remains appropriate    Co-evaluation              AM-PAC PT "6 Clicks" Mobility   Outcome Measure  Help needed turning from your back to your side while in a flat bed without using bedrails?: Total Help needed moving from lying on your back to sitting on the side of a flat bed without using bedrails?: Total Help needed moving to and from a bed to a chair (including a wheelchair)?: Total Help needed standing up from a chair using your arms (e.g., wheelchair or bedside chair)?: Total Help needed to walk in hospital room?: Total Help needed climbing 3-5 steps with a railing? : Total 6 Click Score: 6    End of Session Equipment Utilized During Treatment: Oxygen Activity Tolerance: Patient limited by lethargy Patient left: in bed;with call bell/phone within reach;with bed alarm set;with nursing/sitter in room   PT Visit Diagnosis: Difficulty in walking, not elsewhere classified (R26.2);Unsteadiness on feet (R26.81)     Time: [id number] PT Time Calculation (min) (ACUTE ONLY): 31 min  Charges:  $Therapeutic Exercise: 8-22 mins $Therapeutic Activity: 8-22 mins                     Magda Kiel, PT Acute Rehabilitation  Services Office:[phone] 01/23/2023    Reginia Naas 01/22/2023, 5:41 PM

## 2023-01-22 NOTE — Consult Note (Signed)
[place] Nurse wound follow up Wound type:Stage 2 Pressure Injury; resolved/healed Measurement: NA Wound bed: NA Drainage (amount, consistency, odor) NA Periwound: intact  Dressing procedure/placement/frequency: Continue protection of high risk area with silicone foam dressing. Change every 3 days.  Continue Prevalon boot to offload areas in high risk patient.    Re consult if needed, will not follow at this time. Thanks  Niurka Benecke [employer], RN,CWOCN, CNS, [place] [phone])

## 2023-01-22 NOTE — Procedures (Addendum)
Patient Name: Pamela Johnston  MRN: [id number]  Epilepsy Attending: Lora Havens  Referring Physician/Provider: Greta Doom, MD  Duration: 01/21/2023 1142 to 01/22/2023 1301   Patient history:  82 year old female with very large intraparenchymal hematoma presented on 12/21 who is developed left-sided rhythmic movements.  EEG to evaluate for seizure   Level of alertness: lethargic , asleep   AEDs during EEG study: LEV   Technical aspects: This EEG study was done with scalp electrodes positioned according to the 10-20 International system of electrode placement. Electrical activity was reviewed with band pass filter of 1-70Hz$ , sensitivity of 7 uV/mm, display speed of 89m/sec with a 60Hz$  notched filter applied as appropriate. EEG data were recorded continuously and digitally stored.  Video monitoring was available and reviewed as appropriate.   Description: No clear posterior dominant rhythm was seen. Sleep was characterized by sleep spindles (12 to 14 Hz), maximal frontocentral region. EEG showed continuous generalized and maximal left hemisphere 3 to 6 Hz theta-delta slowing. Generalized and lateralized right hemisphere periodic discharges with triphasic morphology were noted at 1hz$ , more frequent when awake/stimulated. Hyperventilation and photic stimulation were not performed.     Event button was pressed on 01/21/2023 at 1634 for left upper extremity tremor like movement while care team was in the room.  Concomitant EEG before, during and after the event did not show any EEG changes suggest seizure.  Event button was pressed on 01/22/2023 at 0909 for left foot tremor like movement while Neurologist was examining. Concomitant EEG before, during and after the event did not show any EEG changes suggest seizure.   ABNORMALITY -Periodic discharges at times morphology, generalized and lateralized right hemisphere - Continuous slow, generalized and maximal left hemisphere    IMPRESSION: This study was suggestive of cortical dysfunction arising from left hemisphere likely secondary to underlying structural abnormality.  Additionally there were [facility] with triphasic morphology which are most likely secondary to toxic-metabolic causes. Lastly there is moderate diffuse encephalopathy, nonspecific etiology. No seizures were seen throughout the recording.  Event button was pressed on 01/21/2023 at 1634 for left upper extremity tremor-like movement without concomitant EEG change.  Considering the semiology and lack of EEG change, this was most likely not an epileptic event.      Event button was pressed on 01/22/2023 at 0909 for left foot tremor like movement without concomitant EEG change.  Considering the semiology and lack of EEG change, this was most likely not an epileptic event.     Pamela Johnston [name]

## 2023-01-22 NOTE — Progress Notes (Signed)
Nutrition Follow-up  DOCUMENTATION CODES:   Not applicable  INTERVENTION:   Continue tube feeding via PEG tube: - Jevity 1.2 @ 55 ml/hr (1320 ml/day) - PROSource TF20 60 ml daily  Tube feeding regimen provides 1664 kcal, 93 grams of protein, and 1065 ml of H2O.   - Continue 1 packet Juven BID per tube, each packet provides 95 calories, 2.5 grams of protein, and 9.8 grams of carbohydrate; also contains L-arginine and L-glutamine, vitamin C, vitamin E, vitamin B-12, zinc, calcium, and calcium Beta-hydroxy-Beta-methylbutyrate to support wound healing  - Trial Banatrol TF 60 ml BID per tube for loose stools  NUTRITION DIAGNOSIS:   Inadequate oral intake related to inability to eat as evidenced by NPO status.  Ongoing, being addressed via TF  GOAL:   Patient will meet greater than or equal to 90% of their needs  Met via TF  MONITOR:   Labs, Weight trends, TF tolerance, Skin  REASON FOR ASSESSMENT:   Consult Enteral/tube feeding initiation and management  ASSESSMENT:   82 year old female with PMHx of HTN, osteopenia, RLS, sacroiliitis admitted with large left hemispheric parietal/temporal IPH with vasogenic edema/mass effect, also with acute respiratory failure and concern for aspiration PNA given emesis pre-intubation.  01/12 - s/p trach/PEG  Pt with continuous tube feeds infusing via PEG as ordered at time of RD visit. Pt remains on trach collar with no plans to decannulate per notes due to mental status and inability to protect her airway.  Pt having multiple type 6 and type 7 bowel movements daily. Will add Banatrol TF.  Per nursing documentation, pt with non-pitting edema to BUE and BLE.  Current TF: Jevity 1.2 @ 55 ml/hr, PROSource TF20 60 ml daily  Admit weight: 57.8 kg Current weight: 60.2 kg  Medications reviewed and include: pepcid, Juven BID  Labs reviewed. CBG's: 130-152 x 24 hours  UOP: 1875 ml x 24 hours  Diet Order:   Diet Order              Diet NPO time specified  Diet effective midnight                   EDUCATION NEEDS:   Not appropriate for education at this time  Skin:  Skin Assessment: Skin Integrity Issues: DTI: R ankle Stage I: R ear  Last BM:  01/21/23  Height:   Ht Readings from Last 1 Encounters:  11/29/22 5' 1"$  (1.549 m)    Weight:   Wt Readings from Last 1 Encounters:  01/22/23 60.2 kg    Ideal Body Weight:  47.7 kg  BMI:  Body mass index is 25.08 kg/m.  Estimated Nutritional Needs:   Kcal:  1500-1700  Protein:  80-100 grams  Fluid:  1.5-1.7 L/day    Gustavus Bryant, MS, RD, LDN Inpatient Clinical Dietitian Please see AMiON for contact information.

## 2023-01-22 NOTE — Progress Notes (Signed)
Subjective: No EEG change with her abnormal left sided movements.   Exam: Vitals:   01/22/23 1114 01/22/23 1134  BP:  (!) 102/50  Pulse: (!) 114 (!) 102  Resp: (!) 25 (!) 26  Temp:  98.9 F (37.2 C)  SpO2: 95% 94%   Gen: In bed, NAD Resp: non-labored breathing, no acute distress Abd: soft, nt  Neuro: MS: Eyes open, she does not follow commands, does not fixate or track. CN: Eyes are relatively midline, does not fixate or track, no nystagmus.  She has right facial weakness Motor: extensor to nox stim RUE, she does minimally flex to nailbed stimulation in the right lower extremity.  She has stimulus induced rhythmic movements of both the left arm and leg, this has the character more of clonus. Sensory: As above  Pertinent Labs: Creatinine 0.48  Impression: 82 year old female with very large intraparenchymal hematoma presented on 12/21 who is developed left-sided rhythmic movements.  I am not sure that I would aggressively chase the left sided movements unless they were to impair rehab. On reassessment today, I think parkinsonian tremor is much less likely. Could consider trialing low dose baclofen(e.g. 2.24m BID and titrated as tolerated) but given tha tthis would likely be sedating, would not favor pursuing currently.   Recommendations: 1) Could consider baclofen ttrial if the movements become problematic or are interfereing with therapy.  2) d/c eeg 3) Neurology will be available as needed.   MRoland Rack MD [facility] [phone] If 7pm- 7am, please page neurology on call as listed in [place]

## 2023-01-22 NOTE — Progress Notes (Signed)
PROGRESS NOTE  Pamela Johnston [id number] DOB: 02/15/1941 DOA: 11/29/2022 PCP: [name], MD   LOS: 82 days   Brief Narrative / Interim history: 82 year old female with history of hypertension who comes into the hospital and is admitted on 11/29/2022 due to decreased responsiveness, aphasia, right-sided weakness.  CT head on admission showed large IPH of left parietal/left temporal regions with extensions into basal ganglia, secondary dissection with IVH and surrounding vasogenic edema with regional mass effect.  She also had several episodes of vomiting with concern for aspiration.  Neurosurgery, neurology were consulted, she was intubated and admitted to neuro ICU.  She was started on 3% hypertonic saline and palliative care was consulted due to poor prognosis.  She eventually underwent tracheostomy as well as PEG placement, and was also started on Keppra due to concern for seizures.  She is currently on trach collar.  Multiple palliative care discussions have been ongoing with the family, at 1 point hospital leadership was involved, she remains full code/full scope.  Earliest she could be placed is a month out from her tracheostomy placement, 2/12.   Significant events: 12/21 - Presented to [facility] ED via EMS as Code Stroke. LKW 2000. Abrupt onset of decreased responsiveness, aphasia and R hemiplegia. Stroke admitting, NSGY consulted. PCCM consult for vent management. Treated with unasyn for aspiration pneumonia. CT Head with large L hemispheric parietal/temporal lobe IPH with extension into basal ganglia and IVH. HTS 3% started.   12/24 - Palliative care consulted, family requested meeting to be delayed 12/25 - Family requested goals of care meeting to be delayed 12/26 - Stopped Unasyn, replete phos; [name] GOC conversation: family says full code 12/27 - Restarted Unasyn for fever, RLL infiltrate; resp culture: enterobacter, MDR. Palliative consulted  12/28 - CT Head unchanged 12/29 - Unasyn  changed to bactrim 12/31 - Posturing, CT head with increased shift, increased bleeding 1/2 - Family fired CVA service after discussion RE poor prognosis  1/4 - Off sedation  1/5 - No acute issues overnight. Remains on vent with increased respirations off sedation. Fever curve slightly decreased  1/6 - Pall care meeting moved to 2pm today. [name] the son-in-law will be present. On ven 30%/ On TF. Neo not started last night. MAP > 65 after fluids bolus. Febrile +.  Last CT head 12/09/22.  1/8 - No significant neurologic/clinical change. Brief GOC discussion with husband, [name], at bedside in AM. Concerns from staff re: patient GOC/decisionmaking, want to ensure husband is included in decisions/information sharing. Attempted bedside meeting but family prematurely ended meeting due to escalated discussion. 1/9 - Febrile overnight to 101.27F, remains on bromocriptine. No significant neurologic exam changes. 1/10 Family meeting yesterday with [name], plan for trach/peg though husband says they are still discussing this morning. Per Dt. Hulen Skains " So moving forward, [name] is the designated medical decision maker for Thana Heeb " 1/11 no new issues, plan for PEG tomorrow 1/12 for trach/peg today 1/15 No change in Neuro status , trach and PEG, No weaning , will repeat CT head without, get Case management involved in looking for LTAC ( Day 3 post trach on 1/12)   1/15-CT with some stabilization 1/19 No acute events overnight, respiratory culture 1/15 with abundant Pseudomonas, abundant Staphylococcus aureus, and moderate Enterobacter remains on Meropenem  1/22 trach collar trial 12 + hrs 1/24 trach collar x 24 hours on 35% 1/25- concern for seizures-- keppra started after EEG done 2/12- concern for seizures-- EEG reordered  Subjective / 24h Interval events:  No abnormal muscle movements currently  Assesement and Plan: Principal Problem:   ICH (intracerebral hemorrhage) (HCC) Active Problems:    RLS (restless legs syndrome)   Osteopenia   Sacroiliitis (HCC)   Benign essential HTN   Aspiration into airway   Hypoxia   Intraparenchymal hemorrhage of brain (HCC)   Brain compression (HCC)   Acute respiratory failure with hypoxia (HCC)   Pneumonia due to Enterobacter aerogenes (HCC)   Goals of care, counseling/discussion   Pressure injury of skin   Tracheostomy status ([place])    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/mass effect Brain compression/ICH Right parietal lobe hemorrhage with associated edema  Severe encephalopathy Intraventricular hemorrhage and mass effect, vasogenic edema, subfalcine herniation -Per prior team members, she is a very poor prognosis.  Multiple family meetings concluded over time with CMO on board, medical decision maker is [name] patient's son-in-law. Stroke team signed off. Maintain neuro protective measures. Nutrition and bowel regimen. Aspiration precautions    Fevers -patient had an episode of fever of one 101.7 on 2/1 evening - Continue to closely monitor, chest x-ray showed minimal atelectasis but no clear-cut infiltrates.  Prior respiratory cultures showing Pseudomonas, most recent on 01/03/2023 showing Carbapenem resistance.  She has been treated for pneumonia in the past, monitor off antibiotics, but if needed could use Zerbaxa per ID given resistance.  D-dimer is elevated, but lower extremity ultrasound is negative for DVT.  PE is in the differential however she is not having worsening respiratory status, may not be stable to transfer down for CT angiogram, and also she could not be anticoagulated due to her devastating hemorrhagic stroke  Seizures - On Keppra -repeat EEG 2/12 -neurology re-consulted as well   Acute respiratory failure with hypoxia due to large intracerebral hemorrhage - S/p trach/PEG 1/12. Previously completed 10 days of antibiotic coverage for anterobasilar pneumonia, repeat  sputum culture obtained 1/15 positive for abundant Pseudomonas, abundant Staphylococcus aureus, and moderate Enterobacter, s/p course of Meropenem -Continue ATC as tolerated, hope to avoid vent needs -PRN lasix -Wean O2 down as tolerated -Bronchial hygiene -Appreciate PCCM follow-up, last seen 2/12   Urinary retention -foley placed 1/23, Will need urologic follow up as outpatient   History of chronic sacroiliitis Osteopenia RLS -Supportive care  Scheduled Meds:  Chlorhexidine Gluconate Cloth  6 each Topical Daily   famotidine  20 mg Per Tube QHS   feeding supplement (PROSource TF20)  60 mL Per Tube Daily   Gerhardt's butt cream   Topical QID   latanoprost  1 drop Both Eyes QHS   levETIRAcetam  500 mg Per Tube BID   metoprolol tartrate  12.5 mg Per Tube BID   nutrition supplement (JUVEN)  1 packet Per Tube BID BM   mouth rinse  15 mL Mouth Rinse Q2H   phenylephrine-shark liver oil-mineral oil-petrolatum  1 Application Rectal BID   rosuvastatin  20 mg Per Tube Daily   Continuous Infusions:  sodium chloride Stopped (01/01/23 0424)   feeding supplement (JEVITY 1.2 CAL) 1,000 mL (01/22/23 0519)   PRN Meds:.acetaminophen **OR** acetaminophen (TYLENOL) oral liquid 160 mg/5 mL **OR** acetaminophen, bisacodyl, docusate, loperamide HCl, morphine injection, mouth rinse, polyethylene glycol  Current Outpatient Medications  Medication Instructions   latanoprost (XALATAN) 0.005 % ophthalmic solution 1 drop, Both Eyes, Daily at bedtime   oxyCODONE-acetaminophen (PERCOCET/ROXICET) 5-325 MG tablet 1 tablet, Oral, Every 4 hours PRN   pramipexole (MIRAPEX) 0.25 mg, Oral, Nightly    Diet Orders (From admission, onward)  Start     Ordered   12/19/22 0001  Diet NPO time specified  Diet effective midnight        12/18/22 1632            DVT prophylaxis: SCD's Start: 11/29/22 2215   Lab Results  Component Value Date   PLT 259 01/19/2023      Code Status: Full Code  Family  Communication: LM for son Jan 26, 2023  Status is: Inpatient  Remains inpatient appropriate because: needs placement  Level of care: Progressive  Consultants:  Neurology Neurosurgery PCCM Palliative care  Objective: Vitals:   01/26/2023 2358 01/22/23 0403 01/22/23 0435 01/22/23 0741  BP:  (!) 121/59  125/63  Pulse: (!) 105 (!) 106 (!) 103 (!) 121  Resp: (!) 26 (!) 28 (!) 26 (!) 33  Temp:  98.7 F (37.1 C)    TempSrc:  Axillary    SpO2: 95% 96% 96% 93%  Weight:  60.2 kg    Height:        Intake/Output Summary (Last 24 hours) at 01/22/2023 [id number] Last data filed at 01/22/2023 0404 Gross per 24 hour  Intake --  Output 1875 ml  Net -1875 ml   Wt Readings from Last 3 Encounters:  01/22/23 60.2 kg    Examination:   General: Appearance:     Overweight female in no acute distress     Lungs:     respirations unlabored  Heart:    Tachycardic  MS:   No muscle movement  Neurologic:   Does not follow command     Data Reviewed: I have independently reviewed following labs and imaging studies   CBC Recent Labs  Lab 01/19/23 1616  WBC 8.3  HGB 10.2*  HCT 33.6*  PLT 259  MCV 97.4  MCH 29.6  MCHC 30.4  RDW 15.2    Recent Labs  Lab 01/19/23 1616  NA 137  K 3.6  CL 102  CO2 25  GLUCOSE 135*  BUN 39*  CREATININE 0.48  CALCIUM 8.4*    ------------------------------------------------------------------------------------------------------------------ No results for input(s): "CHOL", "HDL", "LDLCALC", "TRIG", "CHOLHDL", "LDLDIRECT" in the last 72 hours.  Lab Results  Component Value Date   HGBA1C 5.5 12/01/2022   ------------------------------------------------------------------------------------------------------------------ No results for input(s): "TSH", "T4TOTAL", "T3FREE", "THYROIDAB" in the last 72 hours.  Invalid input(s): "FREET3"  Cardiac Enzymes No results for input(s): "CKMB", "TROPONINI", "MYOGLOBIN" in the last 168 hours.  Invalid input(s):  "CK" ------------------------------------------------------------------------------------------------------------------ No results found for: "BNP"  CBG: Recent Labs  Lab 01/20/23 1638 01/20/23 1954 2023/01/26 1021 2023-01-26 1246 Jan 26, 2023 1649  GLUCAP 137* 135* 138* 130* 152*    No results found for this or any previous visit (from the past 240 hour(s)).    Radiology Studies: EEG adult  Result Date: 26-Jan-2023 Lora Havens, MD     01-26-23 12:29 PM Patient Name: Pamela Johnston MRN: [id number] Epilepsy Attending: Lora Havens Referring Physician/Provider: Geradine Girt, DO Date: 01/26/23 Duration: 21.23 mins Patient history:  82 year old female with very large intraparenchymal hematoma presented on 12/21 who is developed left-sided rhythmic movements.  EEG to evaluate for seizure Level of alertness: lethargic AEDs during EEG study: LEV Technical aspects: This EEG study was done with scalp electrodes positioned according to the 10-20 International system of electrode placement. Electrical activity was reviewed with band pass filter of 1-70Hz$ , sensitivity of 7 uV/mm, display speed of 59m/sec with a 60Hz$  notched filter applied as appropriate. EEG data were recorded continuously and digitally stored.  Video  monitoring was available and reviewed as appropriate. Description: No clear posterior dominant rhythm was seen. EEG showed continuous generalized and maximal left hemisphere 3 to 6 Hz theta-delta slowing. Abundant sharp waves were noted in right frontal region, at times qasi periodic at 1hz$  and rhythmic. Hyperventilation and photic stimulation were not performed.    ABNORMALITY - Sharp wave, right frontal - Continuous slow, generalized and maximal left hemisphere  IMPRESSION: This study showed evidence of epileptogenicity arising from right frontal region. Additionally there is cortical dysfunction arising from left hemisphere likely secondary to underlying structural abnormality. Lastly  there is moderate diffuse encephalopathy, nonspecific etiology. No seizures were seen throughout the recording.   [place] DO [facility]  Between 7 am - 7 pm I am available, please contact me via Amion (for emergencies) or Securechat (non urgent messages)  Between 7 pm - 7 am I am not available, please contact night coverage MD/APP via Amion

## 2023-01-22 NOTE — Progress Notes (Signed)
LTM EEG disconnected - no skin breakdown at unhook. [facility] notified.   

## 2023-01-23 DIAGNOSIS — I612 Nontraumatic intracerebral hemorrhage in hemisphere, unspecified: Secondary | ICD-10-CM | POA: Diagnosis not present

## 2023-01-23 NOTE — Progress Notes (Signed)
PROGRESS NOTE  Pamela Johnston [id number] DOB: Feb 28, 1941 DOA: 11/29/2022 PCP: Pamela Aly, MD   LOS: 33 days   Brief Narrative / Interim history: 82 year old female with history of hypertension who comes into the hospital and is admitted on 11/29/2022 due to decreased responsiveness, aphasia, right-sided weakness.  CT head on admission showed large IPH of left parietal/left temporal regions with extensions into basal ganglia, secondary dissection with IVH and surrounding vasogenic edema with regional mass effect.  She also had several episodes of vomiting with concern for aspiration.  Neurosurgery, neurology were consulted, she was intubated and admitted to neuro ICU.  She was started on 3% hypertonic saline and palliative care was consulted due to poor prognosis.  She eventually underwent tracheostomy as well as PEG placement, and was also started on Keppra due to concern for seizures.  She is currently on trach collar.  Multiple palliative care discussions have been ongoing with the family, at 1 point hospital leadership was involved, she remains full code/full scope.  Earliest she could be placed is a month out from her tracheostomy placement, 2/12.   Significant events: 12/21 - Presented to [facility] ED via EMS as Code Stroke. LKW 2000. Abrupt onset of decreased responsiveness, aphasia and R hemiplegia. Stroke admitting, NSGY consulted. PCCM consult for vent management. Treated with unasyn for aspiration pneumonia. CT Head with large L hemispheric parietal/temporal lobe IPH with extension into basal ganglia and IVH. HTS 3% started.   12/24 - Palliative care consulted, family requested meeting to be delayed 12/25 - Family requested goals of care meeting to be delayed 12/26 - Stopped Unasyn, replete phos; Pamela Johnston GOC conversation: family says full code 12/27 - Restarted Unasyn for fever, RLL infiltrate; resp culture: enterobacter, MDR. Palliative consulted  12/28 - CT Head unchanged 12/29 - Unasyn  changed to bactrim 12/31 - Posturing, CT head with increased shift, increased bleeding 1/2 - Family fired CVA service after discussion RE poor prognosis  1/4 - Off sedation  1/5 - No acute issues overnight. Remains on vent with increased respirations off sedation. Fever curve slightly decreased  1/6 - Pall care meeting moved to 2pm today. Pamela Johnston the son-in-law will be present. On ven 30%/ On TF. Neo not started last night. MAP > 65 after fluids bolus. Febrile +.  Last CT head 12/09/22.  1/8 - No significant neurologic/clinical change. Brief GOC discussion with husband, Pamela Johnston, at bedside in AM. Concerns from staff re: patient GOC/decisionmaking, want to ensure husband is included in decisions/information sharing. Attempted bedside meeting but family prematurely ended meeting due to escalated discussion. 1/9 - Febrile overnight to 101.87F, remains on bromocriptine. No significant neurologic exam changes. 1/10 Family meeting yesterday with Dr. Hulen Johnston, plan for trach/peg though husband says they are still discussing this morning. Per Dt. Pamela Johnston " So moving forward, Pamela Johnston is the designated medical decision maker for Pamela Johnston " 1/11 no new issues, plan for PEG tomorrow 1/12 for trach/peg today 1/15 No change in Neuro status , trach and PEG, No weaning , will repeat CT head without, get Case management involved in looking for LTAC ( Day 3 post trach on 1/12)   1/15-CT with some stabilization 1/19 No acute events overnight, respiratory culture 1/15 with abundant Pseudomonas, abundant Staphylococcus aureus, and moderate Enterobacter remains on Meropenem  1/22 trach collar trial 12 + hrs 1/24 trach collar x 24 hours on 35% 1/25- concern for seizures-- keppra started after EEG done 2/12- concern for seizures-- EEG reordered  Subjective / 24h Interval events:  Unresponsive.  Husband is at bedside  Assesement and Plan: Principal Problem:   ICH (intracerebral hemorrhage) (HCC) Active Problems:    RLS (restless legs syndrome)   Osteopenia   Sacroiliitis (HCC)   Benign essential HTN   Aspiration into airway   Hypoxia   Intraparenchymal hemorrhage of brain (HCC)   Brain compression (HCC)   Acute respiratory failure with hypoxia (HCC)   Pneumonia due to Enterobacter aerogenes (HCC)   Goals of care, counseling/discussion   Pressure injury of skin   Tracheostomy status ([place])  Principal problem 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/mass effect Brain compression/ICH Right parietal lobe hemorrhage with associated edema  Severe encephalopathy Intraventricular hemorrhage and mass effect, vasogenic edema, subfalcine herniation -Per prior team members, she is a very poor prognosis.  Multiple family meetings concluded over time with CMO on board, medical decision maker is Pamela Johnston patient's son-in-law. Stroke team signed off. Maintain neuro protective measures. Nutrition and bowel regimen. Aspiration precautions   Active problems Fevers - Continue to closely monitor, chest x-ray showed minimal atelectasis but no clear-cut infiltrates.  Prior respiratory cultures showing Pseudomonas, most recent on 01/03/2023 showing Carbapenem resistance.  She has been treated for pneumonia in the past, monitor off antibiotics, but if needed could use Zerbaxa per ID given resistance.  D-dimer is elevated, but lower extremity ultrasound is negative for DVT.  PE is in the differential however she is not having worsening respiratory status, may not be stable to transfer down for CT angiogram, and also she could not be anticoagulated due to her devastating hemorrhagic stroke  Concern for seizures - On Keppra, underwent a repeat EEG on 2/12 and repeat neurology consulted, due to left sided rhythmic movements.  EEG did not show any seizures.  Neurology signed off 2/30   Acute respiratory failure with hypoxia due to large intracerebral hemorrhage - S/p trach/PEG 1/12.  Previously completed 10 days of antibiotic coverage for anterobasilar pneumonia, repeat sputum culture obtained 1/15 positive for abundant Pseudomonas, abundant Staphylococcus aureus, and moderate Enterobacter, s/p course of Meropenem -Continue ATC as tolerated, hope to avoid vent needs -PRN lasix -Wean O2 down as tolerated -Bronchial hygiene -Appreciate PCCM follow-up, last seen 2/12   Urinary retention -foley placed 1/23, Will need urologic follow up as outpatient   History of chronic sacroiliitis Osteopenia RLS -Supportive care  Scheduled Meds:  Chlorhexidine Gluconate Cloth  6 each Topical Daily   famotidine  20 mg Per Tube QHS   feeding supplement (PROSource TF20)  60 mL Per Tube Daily   fiber supplement (BANATROL TF)  60 mL Per Tube BID   Gerhardt's butt cream   Topical QID   latanoprost  1 drop Both Eyes QHS   levETIRAcetam  500 mg Per Tube BID   metoprolol tartrate  12.5 mg Per Tube BID   nutrition supplement (JUVEN)  1 packet Per Tube BID BM   mouth rinse  15 mL Mouth Rinse Q2H   phenylephrine-shark liver oil-mineral oil-petrolatum  1 Application Rectal BID   rosuvastatin  20 mg Per Tube Daily   Continuous Infusions:  sodium chloride Stopped (01/01/23 0424)   feeding supplement (JEVITY 1.2 CAL) 1,000 mL (01/23/23 0614)   PRN Meds:.acetaminophen **OR** acetaminophen (TYLENOL) oral liquid 160 mg/5 mL **OR** acetaminophen, bisacodyl, docusate, loperamide HCl, morphine injection, mouth rinse, polyethylene glycol  Current Outpatient Medications  Medication Instructions   latanoprost (XALATAN) 0.005 % ophthalmic solution 1 drop, Both Eyes, Daily at bedtime   oxyCODONE-acetaminophen (PERCOCET/ROXICET) 5-325 MG tablet  1 tablet, Oral, Every 4 hours PRN   pramipexole (MIRAPEX) 0.25 mg, Oral, Nightly    Diet Orders (From admission, onward)     Start     Ordered   12/19/22 0001  Diet NPO time specified  Diet effective midnight        12/18/22 1632            DVT  prophylaxis: SCD's Start: 11/29/22 2215   Lab Results  Component Value Date   PLT 259 01/19/2023      Code Status: Full Code  Family Communication: Husband at bedside  Status is: Inpatient  Remains inpatient appropriate because: needs placement  Level of care: Progressive  Consultants:  Neurology Neurosurgery PCCM Palliative care  Objective: Vitals:   01/23/23 0500 01/23/23 0740 01/23/23 0812 01/23/23 0816  BP:  (!) 112/52  (!) 112/52  Pulse:  (!) 115 (!) 117 (!) 116  Resp:  (!) 28 (!) 22 (!) 27  Temp:  99.8 F (37.7 C)    TempSrc:  Oral    SpO2:  92% 94% 94%  Weight: 60.2 kg     Height:        Intake/Output Summary (Last 24 hours) at 01/23/2023 1025 Last data filed at 01/22/2023 2304 Gross per 24 hour  Intake --  Output 800 ml  Net -800 ml    Wt Readings from Last 3 Encounters:  01/23/23 60.2 kg    Examination: Constitutional: NAD Eyes: lids and conjunctivae normal, no scleral icterus ENMT: mmm Neck: normal, supple Respiratory: clear to auscultation bilaterally, no wheezing, no crackles.  Cardiovascular: Regular rate and rhythm, no murmurs / rubs / gallops. No LE edema. Abdomen: soft, no distention, no tenderness.   Data Reviewed: I have independently reviewed following labs and imaging studies   CBC Recent Labs  Lab 01/19/23 1616  WBC 8.3  HGB 10.2*  HCT 33.6*  PLT 259  MCV 97.4  MCH 29.6  MCHC 30.4  RDW 15.2     Recent Labs  Lab 01/19/23 1616  NA 137  K 3.6  CL 102  CO2 25  GLUCOSE 135*  BUN 39*  CREATININE 0.48  CALCIUM 8.4*     ------------------------------------------------------------------------------------------------------------------ No results for input(s): "CHOL", "HDL", "LDLCALC", "TRIG", "CHOLHDL", "LDLDIRECT" in the last 72 hours.  Lab Results  Component Value Date   HGBA1C 5.5 12/01/2022    ------------------------------------------------------------------------------------------------------------------ No results for input(s): "TSH", "T4TOTAL", "T3FREE", "THYROIDAB" in the last 72 hours.  Invalid input(s): "FREET3"  Cardiac Enzymes No results for input(s): "CKMB", "TROPONINI", "MYOGLOBIN" in the last 168 hours.  Invalid input(s): "CK" ------------------------------------------------------------------------------------------------------------------ No results found for: "BNP"  CBG: Recent Labs  Lab 01/20/23 1638 01/20/23 1954 01/21/23 1021 01/21/23 1246 01/21/23 1649  GLUCAP 137* 135* 138* 130* 152*     No results found for this or any previous visit (from the past 240 hour(s)).    Radiology Studies: No results found.   Wendelin Reader Karlyne Greenspan, MD, PhD [facility]  Between 7 am - 7 pm I am available, please contact me via Amion (for emergencies) or Securechat (non urgent messages)  Between 7 pm - 7 am I am not available, please contact night coverage MD/APP via Amion

## 2023-01-23 NOTE — TOC Progression Note (Signed)
Transition of Care [facility]) - Progression Note    Patient Details  Name: Pamela Johnston MRN: [id number] Date of Birth: 1941-10-24  Transition of Care [facility] CM/SW [place], [place] Phone Number: 01/23/2023, 4:22 PM  Clinical Narrative:     TOC continues to follow:  Send SNF referral to [facility] - spoke with Santiago Glad, informed CSW emailing referral for review.  Thurmond Butts, MSW, LCSW Clinical Social Worker    Expected Discharge Plan: Skilled Nursing Facility Barriers to Discharge: Ship broker, SNF Pending bed offer  Expected Discharge Plan and Services In-house Referral: Clinical Social Work Discharge Planning Services: CM Consult Post Acute Care Choice: [place] arrangements for the past 2 months: Single Family Home                                       Social Determinants of Health (SDOH) Interventions    Readmission Risk Interventions     No data to display

## 2023-01-24 DIAGNOSIS — I612 Nontraumatic intracerebral hemorrhage in hemisphere, unspecified: Secondary | ICD-10-CM | POA: Diagnosis not present

## 2023-01-24 NOTE — Progress Notes (Signed)
PROGRESS NOTE  KANISHA KRANING [id number] DOB: 05-30-1941 DOA: 11/29/2022 PCP: [name], MD   LOS: 31 days   Brief Narrative / Interim history: 82 year old female with history of hypertension who comes into the hospital and is admitted on 11/29/2022 due to decreased responsiveness, aphasia, right-sided weakness.  CT head on admission showed large IPH of left parietal/left temporal regions with extensions into basal ganglia, secondary dissection with IVH and surrounding vasogenic edema with regional mass effect.  She also had several episodes of vomiting with concern for aspiration.  Neurosurgery, neurology were consulted, she was intubated and admitted to neuro ICU.  She was started on 3% hypertonic saline and palliative care was consulted due to poor prognosis.  She eventually underwent tracheostomy as well as PEG placement, and was also started on Keppra due to concern for seizures.  She is currently on trach collar.  Multiple palliative care discussions have been ongoing with the family, at 1 point hospital leadership was involved, she remains full code/full scope.  Earliest she could be placed is a month out from her tracheostomy placement, 2/12.   Significant events: 12/21 - Presented to [facility] ED via EMS as Code Stroke. LKW 2000. Abrupt onset of decreased responsiveness, aphasia and R hemiplegia. Stroke admitting, NSGY consulted. PCCM consult for vent management. Treated with unasyn for aspiration pneumonia. CT Head with large L hemispheric parietal/temporal lobe IPH with extension into basal ganglia and IVH. HTS 3% started.   12/24 - Palliative care consulted, family requested meeting to be delayed 12/25 - Family requested goals of care meeting to be delayed 12/26 - Stopped Unasyn, replete phos; [name] GOC conversation: family says full code 12/27 - Restarted Unasyn for fever, RLL infiltrate; resp culture: enterobacter, MDR. Palliative consulted  12/28 - CT Head unchanged 12/29 - Unasyn  changed to bactrim 12/31 - Posturing, CT head with increased shift, increased bleeding 1/2 - Family fired CVA service after discussion RE poor prognosis  1/4 - Off sedation  1/5 - No acute issues overnight. Remains on vent with increased respirations off sedation. Fever curve slightly decreased  1/6 - Pall care meeting moved to 2pm today. [name] the son-in-law will be present. On ven 30%/ On TF. Neo not started last night. MAP > 65 after fluids bolus. Febrile +.  Last CT head 12/09/22.  1/8 - No significant neurologic/clinical change. Brief GOC discussion with husband, Pamela Johnston, at bedside in AM. Concerns from staff re: patient GOC/decisionmaking, want to ensure husband is included in decisions/information sharing. Attempted bedside meeting but family prematurely ended meeting due to escalated discussion. 1/9 - Febrile overnight to 101.64F, remains on bromocriptine. No significant neurologic exam changes. 1/10 Family meeting yesterday with [name], plan for trach/peg though husband says they are still discussing this morning. Per Dt. Hulen Skains " So moving forward, Pamela Johnston is the designated medical decision maker for Pamela Johnston " 1/11 no new issues, plan for PEG tomorrow 1/12 for trach/peg today 1/15 No change in Neuro status , trach and PEG, No weaning , will repeat CT head without, get Case management involved in looking for LTAC ( Day 3 post trach on 1/12)   1/15-CT with some stabilization 1/19 No acute events overnight, respiratory culture 1/15 with abundant Pseudomonas, abundant Staphylococcus aureus, and moderate Enterobacter remains on Meropenem  1/22 trach collar trial 12 + hrs 1/24 trach collar x 24 hours on 35% 1/25- concern for seizures-- keppra started after EEG done 2/12- concern for seizures-- EEG reordered  Subjective / 24h Interval events:  Remains unresponsive.  No significant overnight events.  Assesement and Plan: Principal Problem:   ICH (intracerebral hemorrhage)  (HCC) Active Problems:   RLS (restless legs syndrome)   Osteopenia   Sacroiliitis (HCC)   Benign essential HTN   Aspiration into airway   Hypoxia   Intraparenchymal hemorrhage of brain (HCC)   Brain compression (HCC)   Acute respiratory failure with hypoxia (HCC)   Pneumonia due to Enterobacter aerogenes (HCC)   Goals of care, counseling/discussion   Pressure injury of skin   Tracheostomy status ([place])  Principal problem 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/mass effect Brain compression/ICH Right parietal lobe hemorrhage with associated edema  Severe encephalopathy Intraventricular hemorrhage and mass effect, vasogenic edema, subfalcine herniation -Per prior team members, she is a very poor prognosis.  Multiple family meetings concluded over time with CMO on board, medical decision maker is [name] patient's son-in-law. Stroke team signed off. Maintain neuro protective measures. Nutrition and bowel regimen. Aspiration precautions   Active problems Fevers - Continue to closely monitor, chest x-ray showed minimal atelectasis but no clear-cut infiltrates.  Prior respiratory cultures showing Pseudomonas, most recent on 01/03/2023 showing Carbapenem resistance.  She has been treated for pneumonia in the past, monitor off antibiotics, but if needed could use Zerbaxa per ID given resistance.  D-dimer is elevated, but lower extremity ultrasound is negative for DVT.  PE is in the differential however she is not having worsening respiratory status, may not be stable to transfer down for CT angiogram, and also she could not be anticoagulated due to her devastating hemorrhagic stroke  Concern for seizures - On Keppra, underwent a repeat EEG on 2/12 and repeat neurology consulted, due to left sided rhythmic movements.  EEG did not show any seizures.  Neurology signed off 2/30   Acute respiratory failure with hypoxia due to large intracerebral  hemorrhage - S/p trach/PEG 1/12. Previously completed 10 days of antibiotic coverage for anterobasilar pneumonia, repeat sputum culture obtained 1/15 positive for abundant Pseudomonas, abundant Staphylococcus aureus, and moderate Enterobacter, s/p course of Meropenem -Continue ATC as tolerated, hope to avoid vent needs -PRN lasix -Wean O2 down as tolerated -Bronchial hygiene -Appreciate PCCM follow-up, last seen 2/12   Urinary retention -foley placed 1/23, Will need urologic follow up as outpatient   History of chronic sacroiliitis Osteopenia RLS -Supportive care  Scheduled Meds:  Chlorhexidine Gluconate Cloth  6 each Topical Daily   famotidine  20 mg Per Tube QHS   feeding supplement (PROSource TF20)  60 mL Per Tube Daily   fiber supplement (BANATROL TF)  60 mL Per Tube BID   Gerhardt's butt cream   Topical QID   latanoprost  1 drop Both Eyes QHS   levETIRAcetam  500 mg Per Tube BID   metoprolol tartrate  12.5 mg Per Tube BID   nutrition supplement (JUVEN)  1 packet Per Tube BID BM   mouth rinse  15 mL Mouth Rinse Q2H   phenylephrine-shark liver oil-mineral oil-petrolatum  1 Application Rectal BID   rosuvastatin  20 mg Per Tube Daily   Continuous Infusions:  sodium chloride Stopped (01/01/23 0424)   feeding supplement (JEVITY 1.2 CAL) 1,000 mL (01/23/23 0614)   PRN Meds:.acetaminophen **OR** acetaminophen (TYLENOL) oral liquid 160 mg/5 mL **OR** acetaminophen, bisacodyl, docusate, loperamide HCl, morphine injection, mouth rinse, polyethylene glycol  Current Outpatient Medications  Medication Instructions   latanoprost (XALATAN) 0.005 % ophthalmic solution 1 drop, Both Eyes, Daily at bedtime   oxyCODONE-acetaminophen (PERCOCET/ROXICET) 5-325 MG  tablet 1 tablet, Oral, Every 4 hours PRN   pramipexole (MIRAPEX) 0.25 mg, Oral, Nightly    Diet Orders (From admission, onward)     Start     Ordered   12/19/22 0001  Diet NPO time specified  Diet effective midnight        12/18/22  1632            DVT prophylaxis: SCD's Start: 11/29/22 2215   Lab Results  Component Value Date   PLT 259 01/19/2023      Code Status: Full Code  Family Communication: No family at bedside  Status is: Inpatient  Remains inpatient appropriate because: needs placement  Level of care: Progressive  Consultants:  Neurology Neurosurgery PCCM Palliative care  Objective: Vitals:   01/24/23 0737 01/24/23 0843 01/24/23 1104 01/24/23 1134  BP: (!) 109/50  107/62   Pulse: (!) 120 (!) 121 100 (!) 105  Resp: (!) 26 (!) 21 18 (!) 24  Temp: 99.1 F (37.3 C)  99.5 F (37.5 C)   TempSrc: Axillary  Axillary   SpO2: 92% 94% 97% 97%  Weight:      Height:        Intake/Output Summary (Last 24 hours) at 01/24/2023 1136 Last data filed at 01/24/2023 0644 Gross per 24 hour  Intake --  Output 950 ml  Net -950 ml    Wt Readings from Last 3 Encounters:  01/23/23 60.2 kg    Examination: Constitutional: NAD Respiratory: CTA  Cardiovascular: RRR  Data Reviewed: I have independently reviewed following labs and imaging studies   CBC Recent Labs  Lab 01/19/23 1616  WBC 8.3  HGB 10.2*  HCT 33.6*  PLT 259  MCV 97.4  MCH 29.6  MCHC 30.4  RDW 15.2     Recent Labs  Lab 01/19/23 1616  NA 137  K 3.6  CL 102  CO2 25  GLUCOSE 135*  BUN 39*  CREATININE 0.48  CALCIUM 8.4*     ------------------------------------------------------------------------------------------------------------------ No results for input(s): "CHOL", "HDL", "LDLCALC", "TRIG", "CHOLHDL", "LDLDIRECT" in the last 72 hours.  Lab Results  Component Value Date   HGBA1C 5.5 12/01/2022   ------------------------------------------------------------------------------------------------------------------ No results for input(s): "TSH", "T4TOTAL", "T3FREE", "THYROIDAB" in the last 72 hours.  Invalid input(s): "FREET3"  Cardiac Enzymes No results for input(s): "CKMB", "TROPONINI", "MYOGLOBIN" in the  last 168 hours.  Invalid input(s): "CK" ------------------------------------------------------------------------------------------------------------------ No results found for: "BNP"  CBG: Recent Labs  Lab 01/20/23 1638 01/20/23 1954 01/21/23 1021 01/21/23 1246 01/21/23 1649  GLUCAP 137* 135* 138* 130* 152*     No results found for this or any previous visit (from the past 240 hour(s)).    Radiology Studies: No results found.   Jaydence Vanyo Karlyne Greenspan, MD, PhD [facility]  Between 7 am - 7 pm I am available, please contact me via Amion (for emergencies) or Securechat (non urgent messages)  Between 7 pm - 7 am I am not available, please contact night coverage MD/APP via Amion

## 2023-01-25 DIAGNOSIS — I612 Nontraumatic intracerebral hemorrhage in hemisphere, unspecified: Secondary | ICD-10-CM | POA: Diagnosis not present

## 2023-01-25 LAB — CBC
HCT: 34.3 % — ABNORMAL LOW (ref 36.0–46.0)
Hemoglobin: 10.7 g/dL — ABNORMAL LOW (ref 12.0–15.0)
MCH: 30.7 pg (ref 26.0–34.0)
MCHC: 31.2 g/dL (ref 30.0–36.0)
MCV: 98.3 fL (ref 80.0–100.0)
Platelets: 316 10*3/uL (ref 150–400)
RBC: 3.49 MIL/uL — ABNORMAL LOW (ref 3.87–5.11)
RDW: 15.7 % — ABNORMAL HIGH (ref 11.5–15.5)
WBC: 7.7 10*3/uL (ref 4.0–10.5)
nRBC: 0 % (ref 0.0–0.2)

## 2023-01-25 LAB — MAGNESIUM: Magnesium: 2.3 mg/dL (ref 1.7–2.4)

## 2023-01-25 LAB — COMPREHENSIVE METABOLIC PANEL
ALT: 26 U/L (ref 0–44)
AST: 41 U/L (ref 15–41)
Albumin: 2.2 g/dL — ABNORMAL LOW (ref 3.5–5.0)
Alkaline Phosphatase: 188 U/L — ABNORMAL HIGH (ref 38–126)
Anion gap: 8 (ref 5–15)
BUN: 33 mg/dL — ABNORMAL HIGH (ref 8–23)
CO2: 28 mmol/L (ref 22–32)
Calcium: 8.3 mg/dL — ABNORMAL LOW (ref 8.9–10.3)
Chloride: 102 mmol/L (ref 98–111)
Creatinine, Ser: 0.41 mg/dL — ABNORMAL LOW (ref 0.44–1.00)
GFR, Estimated: >60 mL/min (ref >60)
Glucose, Bld: 126 mg/dL — ABNORMAL HIGH (ref 70–99)
Potassium: 3.9 mmol/L (ref 3.5–5.1)
Sodium: 138 mmol/L (ref 135–145)
Total Bilirubin: 0.1 mg/dL — ABNORMAL LOW (ref 0.3–1.2)
Total Protein: 6.1 g/dL — ABNORMAL LOW (ref 6.5–8.1)

## 2023-01-25 NOTE — Progress Notes (Signed)
PROGRESS NOTE  Pamela Johnston [id number] DOB: Jan 21, 1941 DOA: 11/29/2022 PCP: [name], MD   LOS: 24 days   Brief Narrative / Interim history: 82 year old female with history of hypertension who comes into the hospital and is admitted on 11/29/2022 due to decreased responsiveness, aphasia, right-sided weakness.  CT head on admission showed large IPH of left parietal/left temporal regions with extensions into basal ganglia, secondary dissection with IVH and surrounding vasogenic edema with regional mass effect.  She also had several episodes of vomiting with concern for aspiration.  Neurosurgery, neurology were consulted, she was intubated and admitted to neuro ICU.  She was started on 3% hypertonic saline and palliative care was consulted due to poor prognosis.  She eventually underwent tracheostomy as well as PEG placement, and was also started on Keppra due to concern for seizures.  She is currently on trach collar.  Multiple palliative care discussions have been ongoing with the family, at 1 point hospital leadership was involved, she remains full code/full scope.  Earliest she could be placed is a month out from her tracheostomy placement, 2/12.   Significant events: 12/21 - Presented to [facility] ED via EMS as Code Stroke. LKW 2000. Abrupt onset of decreased responsiveness, aphasia and R hemiplegia. Stroke admitting, NSGY consulted. PCCM consult for vent management. Treated with unasyn for aspiration pneumonia. CT Head with large L hemispheric parietal/temporal lobe IPH with extension into basal ganglia and IVH. HTS 3% started.   12/24 - Palliative care consulted, family requested meeting to be delayed 12/25 - Family requested goals of care meeting to be delayed 12/26 - Stopped Unasyn, replete phos; [name] GOC conversation: family says full code 12/27 - Restarted Unasyn for fever, RLL infiltrate; resp culture: enterobacter, MDR. Palliative consulted  12/28 - CT Head unchanged 12/29 - Unasyn  changed to bactrim 12/31 - Posturing, CT head with increased shift, increased bleeding 1/2 - Family fired CVA service after discussion RE poor prognosis  1/4 - Off sedation  1/5 - No acute issues overnight. Remains on vent with increased respirations off sedation. Fever curve slightly decreased  1/6 - Pall care meeting moved to 2pm today. [name] the son-in-law will be present. On ven 30%/ On TF. Neo not started last night. MAP > 65 after fluids bolus. Febrile +.  Last CT head 12/09/22.  1/8 - No significant neurologic/clinical change. Brief GOC discussion with husband, [name], at bedside in AM. Concerns from staff re: patient GOC/decisionmaking, want to ensure husband is included in decisions/information sharing. Attempted bedside meeting but family prematurely ended meeting due to escalated discussion. 1/9 - Febrile overnight to 101.17F, remains on bromocriptine. No significant neurologic exam changes. 1/10 Family meeting yesterday with [name], plan for trach/peg though husband says they are still discussing this morning. Per Dt. Hulen Skains " So moving forward, [name] is the designated medical decision maker for Richardine Easterwood " 1/11 no new issues, plan for PEG tomorrow 1/12 for trach/peg today 1/15 No change in Neuro status , trach and PEG, No weaning , will repeat CT head without, get Case management involved in looking for LTAC ( Day 3 post trach on 1/12)   1/15-CT with some stabilization 1/19 No acute events overnight, respiratory culture 1/15 with abundant Pseudomonas, abundant Staphylococcus aureus, and moderate Enterobacter remains on Meropenem  1/22 trach collar trial 12 + hrs 1/24 trach collar x 24 hours on 35% 1/25- concern for seizures-- keppra started after EEG done 2/12- concern for seizures-- EEG reordered  Subjective / 24h Interval events:  Remains unresponsive.  No significant overnight events.  Assesement and Plan: Principal Problem:   ICH (intracerebral hemorrhage)  (HCC) Active Problems:   RLS (restless legs syndrome)   Osteopenia   Sacroiliitis (HCC)   Benign essential HTN   Aspiration into airway   Hypoxia   Intraparenchymal hemorrhage of brain (HCC)   Brain compression (HCC)   Acute respiratory failure with hypoxia (HCC)   Pneumonia due to Enterobacter aerogenes (HCC)   Goals of care, counseling/discussion   Pressure injury of skin   Tracheostomy status ([place])  Principal problem 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/mass effect Brain compression/ICH Right parietal lobe hemorrhage with associated edema  Severe encephalopathy Intraventricular hemorrhage and mass effect, vasogenic edema, subfalcine herniation -Per prior team members, she is a very poor prognosis.  Multiple family meetings concluded over time with CMO on board, medical decision maker is [name] patient's son-in-law. Stroke team signed off. Maintain neuro protective measures. Nutrition and bowel regimen. Aspiration precautions   Active problems Fevers - Continue to closely monitor, chest x-ray showed minimal atelectasis but no clear-cut infiltrates.  Prior respiratory cultures showing Pseudomonas, most recent on 01/03/2023 showing Carbapenem resistance.  She has been treated for pneumonia in the past, monitor off antibiotics, but if needed could use Zerbaxa per ID given resistance.  D-dimer is elevated, but lower extremity ultrasound is negative for DVT.  PE is in the differential however she is not having worsening respiratory status, may not be stable to transfer down for CT angiogram, and also she could not be anticoagulated due to her devastating hemorrhagic stroke  Concern for seizures - On Keppra, underwent a repeat EEG on 2/12 and repeat neurology consulted, due to left sided rhythmic movements.  EEG did not show any seizures.  Neurology signed off 2/30   Acute respiratory failure with hypoxia due to large intracerebral  hemorrhage - S/p trach/PEG 1/12. Previously completed 10 days of antibiotic coverage for anterobasilar pneumonia, repeat sputum culture obtained 1/15 positive for abundant Pseudomonas, abundant Staphylococcus aureus, and moderate Enterobacter, s/p course of Meropenem -Continue ATC as tolerated, hope to avoid vent needs -PRN lasix -Wean O2 down as tolerated -Bronchial hygiene -Appreciate PCCM follow-up, last seen 2/12   Urinary retention -foley placed 1/23, Will need urologic follow up as outpatient   History of chronic sacroiliitis Osteopenia RLS -Supportive care  Scheduled Meds:  Chlorhexidine Gluconate Cloth  6 each Topical Daily   famotidine  20 mg Per Tube QHS   feeding supplement (PROSource TF20)  60 mL Per Tube Daily   fiber supplement (BANATROL TF)  60 mL Per Tube BID   Gerhardt's butt cream   Topical QID   latanoprost  1 drop Both Eyes QHS   levETIRAcetam  500 mg Per Tube BID   metoprolol tartrate  12.5 mg Per Tube BID   nutrition supplement (JUVEN)  1 packet Per Tube BID BM   mouth rinse  15 mL Mouth Rinse Q2H   phenylephrine-shark liver oil-mineral oil-petrolatum  1 Application Rectal BID   rosuvastatin  20 mg Per Tube Daily   Continuous Infusions:  sodium chloride Stopped (01/01/23 0424)   feeding supplement (JEVITY 1.2 CAL) 1,000 mL (01/24/23 2145)   PRN Meds:.acetaminophen **OR** acetaminophen (TYLENOL) oral liquid 160 mg/5 mL **OR** acetaminophen, bisacodyl, docusate, loperamide HCl, morphine injection, mouth rinse, polyethylene glycol  Current Outpatient Medications  Medication Instructions   latanoprost (XALATAN) 0.005 % ophthalmic solution 1 drop, Both Eyes, Daily at bedtime   oxyCODONE-acetaminophen (PERCOCET/ROXICET) 5-325 MG  tablet 1 tablet, Oral, Every 4 hours PRN   pramipexole (MIRAPEX) 0.25 mg, Oral, Nightly    Diet Orders (From admission, onward)     Start     Ordered   12/19/22 0001  Diet NPO time specified  Diet effective midnight        12/18/22  1632            DVT prophylaxis: SCD's Start: 11/29/22 2215   Lab Results  Component Value Date   PLT 316 01/25/2023      Code Status: Full Code  Family Communication: No family at bedside  Status is: Inpatient  Remains inpatient appropriate because: needs placement  Level of care: Progressive  Consultants:  Neurology Neurosurgery PCCM Palliative care  Objective: Vitals:   01/24/23 2303 01/25/23 0258 01/25/23 0733 01/25/23 0805  BP: (!) 113/58 129/65 (!) 141/60   Pulse: 88 (!) 104 (!) 119 (!) 124  Resp: (!) 23 (!) 29 (!) 28 (!) 30  Temp: 99.4 F (37.4 C) 98.9 F (37.2 C) 99.5 F (37.5 C)   TempSrc: Axillary Axillary Axillary   SpO2: 97% 94% 91% 96%  Weight:      Height:        Intake/Output Summary (Last 24 hours) at 01/25/2023 1113 Last data filed at 01/25/2023 [id number] Gross per 24 hour  Intake --  Output 1000 ml  Net -1000 ml    Wt Readings from Last 3 Encounters:  01/23/23 60.2 kg    Examination: Constitutional: NAD Respiratory: CTA Cardiovascular: RRR  Data Reviewed: I have independently reviewed following labs and imaging studies   CBC Recent Labs  Lab 01/19/23 1616 01/25/23 0326  WBC 8.3 7.7  HGB 10.2* 10.7*  HCT 33.6* 34.3*  PLT 259 316  MCV 97.4 98.3  MCH 29.6 30.7  MCHC 30.4 31.2  RDW 15.2 15.7*     Recent Labs  Lab 01/19/23 1616 01/25/23 0326  NA 137 138  K 3.6 3.9  CL 102 102  CO2 25 28  GLUCOSE 135* 126*  BUN 39* 33*  CREATININE 0.48 0.41*  CALCIUM 8.4* 8.3*  AST  --  41  ALT  --  26  ALKPHOS  --  188*  BILITOT  --  0.1*  ALBUMIN  --  2.2*  MG  --  2.3     ------------------------------------------------------------------------------------------------------------------ No results for input(s): "CHOL", "HDL", "LDLCALC", "TRIG", "CHOLHDL", "LDLDIRECT" in the last 72 hours.  Lab Results  Component Value Date   HGBA1C 5.5 12/01/2022    ------------------------------------------------------------------------------------------------------------------ No results for input(s): "TSH", "T4TOTAL", "T3FREE", "THYROIDAB" in the last 72 hours.  Invalid input(s): "FREET3"  Cardiac Enzymes No results for input(s): "CKMB", "TROPONINI", "MYOGLOBIN" in the last 168 hours.  Invalid input(s): "CK" ------------------------------------------------------------------------------------------------------------------ No results found for: "BNP"  CBG: Recent Labs  Lab 01/20/23 1638 01/20/23 1954 01/21/23 1021 01/21/23 1246 01/21/23 1649  GLUCAP 137* 135* 138* 130* 152*     No results found for this or any previous visit (from the past 240 hour(s)).    Radiology Studies: No results found.   Elma Limas Karlyne Greenspan, MD, PhD [facility]  Between 7 am - 7 pm I am available, please contact me via Amion (for emergencies) or Securechat (non urgent messages)  Between 7 pm - 7 am I am not available, please contact night coverage MD/APP via Amion

## 2023-01-25 NOTE — TOC Progression Note (Signed)
Transition of Care [facility]) - Progression Note    Patient Details  Name: Pamela Johnston MRN: [id number] Date of Birth: 02/19/1941  Transition of Care [facility]) CM/SW [place], [place] Phone Number: 01/25/2023, 1:15 PM  Clinical Narrative:     [facility] Admissions/ Santiago Glad, Left voice message to return call.   Thurmond Butts, MSW, LCSW Clinical Social Worker    Expected Discharge Plan: Skilled Nursing Facility Barriers to Discharge: Ship broker, SNF Pending bed offer  Expected Discharge Plan and Services In-house Referral: Clinical Social Work Discharge Planning Services: CM Consult Post Acute Care Choice: [place] arrangements for the past 2 months: Single Family Home                                       Social Determinants of Health (SDOH) Interventions    Readmission Risk Interventions     No data to display

## 2023-01-26 DIAGNOSIS — I612 Nontraumatic intracerebral hemorrhage in hemisphere, unspecified: Secondary | ICD-10-CM | POA: Diagnosis not present

## 2023-01-26 NOTE — Progress Notes (Signed)
PROGRESS NOTE  Pamela Johnston [id number] DOB: [birth year] DOA: 11/29/2022 PCP: Pamela Aly, MD   LOS: 47 days   Brief Narrative / Interim history: 82 year old female with history of hypertension who comes into the hospital and is admitted on 11/29/2022 due to decreased responsiveness, aphasia, right-sided weakness.  CT head on admission showed large IPH of left parietal/left temporal regions with extensions into basal ganglia, secondary dissection with IVH and surrounding vasogenic edema with regional mass effect.  She also had several episodes of vomiting with concern for aspiration.  Neurosurgery, neurology were consulted, she was intubated and admitted to neuro ICU.  She was started on 3% hypertonic saline and palliative care was consulted due to poor prognosis.  She eventually underwent tracheostomy as well as PEG placement, and was also started on Keppra due to concern for seizures.  She is currently on trach collar.  Multiple palliative care discussions have been ongoing with the family, at 1 point hospital leadership was involved, she remains full code/full scope.  Earliest she could be placed is a month out from her tracheostomy placement, 2/12.   Significant events: 12/21 - Presented to [facility] ED via EMS as Code Stroke. LKW 2000. Abrupt onset of decreased responsiveness, aphasia and R hemiplegia. Stroke admitting, NSGY consulted. PCCM consult for vent management. Treated with unasyn for aspiration pneumonia. CT Head with large L hemispheric parietal/temporal lobe IPH with extension into basal ganglia and IVH. HTS 3% started.   12/24 - Palliative care consulted, family requested meeting to be delayed 12/25 - Family requested goals of care meeting to be delayed 12/26 - Stopped Unasyn, replete phos; Pamela Johnston GOC conversation: family says full code 12/27 - Restarted Unasyn for fever, RLL infiltrate; resp culture: enterobacter, MDR. Palliative consulted  12/28 - CT Head unchanged 12/29 - Unasyn  changed to bactrim 12/31 - Posturing, CT head with increased shift, increased bleeding 1/2 - Family fired CVA service after discussion RE poor prognosis  1/4 - Off sedation  1/5 - No acute issues overnight. Remains on vent with increased respirations off sedation. Fever curve slightly decreased  1/6 - Pall care meeting moved to 2pm today. Pamela Johnston the son-in-law will be present. On ven 30%/ On TF. Neo not started last night. MAP > 65 after fluids bolus. Febrile +.  Last CT head 12/09/22.  1/8 - No significant neurologic/clinical change. Brief GOC discussion with husband, Pamela Johnston, at bedside in AM. Concerns from staff re: patient GOC/decisionmaking, want to ensure husband is included in decisions/information sharing. Attempted bedside meeting but family prematurely ended meeting due to escalated discussion. 1/9 - Febrile overnight to 101.65F, remains on bromocriptine. No significant neurologic exam changes. 1/10 Family meeting yesterday with [name], plan for trach/peg though husband says they are still discussing this morning. Per Dt. Hulen Skains " So moving forward, Pamela Johnston is the designated medical decision maker for Pamela Johnston " 1/11 no new issues, plan for PEG tomorrow 1/12 for trach/peg today 1/15 No change in Neuro status , trach and PEG, No weaning , will repeat CT head without, get Case management involved in looking for LTAC ( Day 3 post trach on 1/12)   1/15-CT with some stabilization 1/19 No acute events overnight, respiratory culture 1/15 with abundant Pseudomonas, abundant Staphylococcus aureus, and moderate Enterobacter remains on Meropenem  1/22 trach collar trial 12 + hrs 1/24 trach collar x 24 hours on 35% 1/25- concern for seizures-- keppra started after EEG done 2/12- concern for seizures-- EEG reordered  Subjective / 24h Interval events:  No overnight events.  Unresponsive  Assesement and Plan: Principal Problem:   ICH (intracerebral hemorrhage) (HCC) Active Problems:    RLS (restless legs syndrome)   Osteopenia   Sacroiliitis (HCC)   Benign essential HTN   Aspiration into airway   Hypoxia   Intraparenchymal hemorrhage of brain (HCC)   Brain compression (HCC)   Acute respiratory failure with hypoxia (HCC)   Pneumonia due to Enterobacter aerogenes (HCC)   Goals of care, counseling/discussion   Pressure injury of skin   Tracheostomy status ([place])  Principal problem 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/mass effect Brain compression/ICH Right parietal lobe hemorrhage with associated edema  Severe encephalopathy Intraventricular hemorrhage and mass effect, vasogenic edema, subfalcine herniation -Per prior team members, she is a very poor prognosis.  Multiple family meetings concluded over time with CMO on board, medical decision maker is Pamela Johnston patient's son-in-law. Stroke team signed off. Maintain neuro protective measures. Nutrition and bowel regimen. Aspiration precautions   Active problems Fevers - Continue to closely monitor, chest x-ray showed minimal atelectasis but no clear-cut infiltrates.  Prior respiratory cultures showing Pseudomonas, most recent on 01/03/2023 showing Carbapenem resistance.  She has been treated for pneumonia in the past, monitor off antibiotics, but if needed could use Zerbaxa per ID given resistance.  D-dimer is elevated, but lower extremity ultrasound is negative for DVT.  PE is in the differential however she is not having worsening respiratory status, may not be stable to transfer down for CT angiogram, and also she could not be anticoagulated due to her devastating hemorrhagic stroke  Concern for seizures - On Keppra, underwent a repeat EEG on 2/12 and repeat neurology consulted, due to left sided rhythmic movements.  EEG did not show any seizures.  Neurology signed off 2/30   Acute respiratory failure with hypoxia due to large intracerebral hemorrhage - S/p trach/PEG 1/12.  Previously completed 10 days of antibiotic coverage for anterobasilar pneumonia, repeat sputum culture obtained 1/15 positive for abundant Pseudomonas, abundant Staphylococcus aureus, and moderate Enterobacter, s/p course of Meropenem -Continue ATC as tolerated, hope to avoid vent needs -PRN lasix -Wean O2 down as tolerated -Bronchial hygiene -Appreciate PCCM follow-up, last seen 2/12   Urinary retention -foley placed 1/23, Will need urologic follow up as outpatient   History of chronic sacroiliitis Osteopenia RLS -Supportive care  Scheduled Meds:  Chlorhexidine Gluconate Cloth  6 each Topical Daily   famotidine  20 mg Per Tube QHS   feeding supplement (PROSource TF20)  60 mL Per Tube Daily   fiber supplement (BANATROL TF)  60 mL Per Tube BID   Gerhardt's butt cream   Topical QID   latanoprost  1 drop Both Eyes QHS   levETIRAcetam  500 mg Per Tube BID   metoprolol tartrate  12.5 mg Per Tube BID   nutrition supplement (JUVEN)  1 packet Per Tube BID BM   mouth rinse  15 mL Mouth Rinse Q2H   phenylephrine-shark liver oil-mineral oil-petrolatum  1 Application Rectal BID   rosuvastatin  20 mg Per Tube Daily   Continuous Infusions:  sodium chloride Stopped (01/01/23 0424)   feeding supplement (JEVITY 1.2 CAL) 1,000 mL (01/25/23 2000)   PRN Meds:.acetaminophen **OR** acetaminophen (TYLENOL) oral liquid 160 mg/5 mL **OR** acetaminophen, bisacodyl, docusate, loperamide HCl, morphine injection, mouth rinse, polyethylene glycol  Current Outpatient Medications  Medication Instructions   latanoprost (XALATAN) 0.005 % ophthalmic solution 1 drop, Both Eyes, Daily at bedtime   oxyCODONE-acetaminophen (PERCOCET/ROXICET) 5-325 MG tablet 1  tablet, Oral, Every 4 hours PRN   pramipexole (MIRAPEX) 0.25 mg, Oral, Nightly    Diet Orders (From admission, onward)     Start     Ordered   12/19/22 0001  Diet NPO time specified  Diet effective midnight        12/18/22 1632            DVT  prophylaxis: SCD's Start: 11/29/22 2215   Lab Results  Component Value Date   PLT 316 01/25/2023      Code Status: Full Code  Family Communication: No family at bedside  Status is: Inpatient  Remains inpatient appropriate because: needs placement  Level of care: Progressive  Consultants:  Neurology Neurosurgery PCCM Palliative care  Objective: Vitals:   01/26/23 0317 01/26/23 0333 01/26/23 0826 01/26/23 0835  BP: 119/65  106/61   Pulse: 99 (!) 109 (!) 109 (!) 112  Resp: (!) 25 (!) 26 (!) 27 (!) 25  Temp: 99.8 F (37.7 C)  97.7 F (36.5 C)   TempSrc: Axillary  Axillary   SpO2: 96% 94% 93% 96%  Weight:      Height:        Intake/Output Summary (Last 24 hours) at 01/26/2023 1047 Last data filed at 01/26/2023 [id number] Gross per 24 hour  Intake --  Output 1750 ml  Net -1750 ml    Wt Readings from Last 3 Encounters:  01/23/23 60.2 kg    Examination: Constitutional: NAD Respiratory: CTA Cardiovascular: RRR  Data Reviewed: I have independently reviewed following labs and imaging studies   CBC Recent Labs  Lab 01/19/23 1616 01/25/23 0326  WBC 8.3 7.7  HGB 10.2* 10.7*  HCT 33.6* 34.3*  PLT 259 316  MCV 97.4 98.3  MCH 29.6 30.7  MCHC 30.4 31.2  RDW 15.2 15.7*     Recent Labs  Lab 01/19/23 1616 01/25/23 0326  NA 137 138  K 3.6 3.9  CL 102 102  CO2 25 28  GLUCOSE 135* 126*  BUN 39* 33*  CREATININE 0.48 0.41*  CALCIUM 8.4* 8.3*  AST  --  41  ALT  --  26  ALKPHOS  --  188*  BILITOT  --  0.1*  ALBUMIN  --  2.2*  MG  --  2.3     ------------------------------------------------------------------------------------------------------------------ No results for input(s): "CHOL", "HDL", "LDLCALC", "TRIG", "CHOLHDL", "LDLDIRECT" in the last 72 hours.  Lab Results  Component Value Date   HGBA1C 5.5 12/01/2022   ------------------------------------------------------------------------------------------------------------------ No results for  input(s): "TSH", "T4TOTAL", "T3FREE", "THYROIDAB" in the last 72 hours.  Invalid input(s): "FREET3"  Cardiac Enzymes No results for input(s): "CKMB", "TROPONINI", "MYOGLOBIN" in the last 168 hours.  Invalid input(s): "CK" ------------------------------------------------------------------------------------------------------------------ No results found for: "BNP"  CBG: Recent Labs  Lab 01/20/23 1638 01/20/23 1954 01/21/23 1021 01/21/23 1246 01/21/23 1649  GLUCAP 137* 135* 138* 130* 152*     No results found for this or any previous visit (from the past 240 hour(s)).    Radiology Studies: No results found.   Ilona Colley Karlyne Greenspan, MD, PhD [facility]  Between 7 am - 7 pm I am available, please contact me via Amion (for emergencies) or Securechat (non urgent messages)  Between 7 pm - 7 am I am not available, please contact night coverage MD/APP via Amion

## 2023-01-27 DIAGNOSIS — I612 Nontraumatic intracerebral hemorrhage in hemisphere, unspecified: Secondary | ICD-10-CM | POA: Diagnosis not present

## 2023-01-27 NOTE — Progress Notes (Signed)
PROGRESS NOTE  Pamela Johnston [id number] DOB: 03/14/1941 DOA: 11/29/2022 PCP: [name], MD   LOS: 47 days   Brief Narrative / Interim history: 82 year old female with history of hypertension who comes into the hospital and is admitted on 11/29/2022 due to decreased responsiveness, aphasia, right-sided weakness.  CT head on admission showed large IPH of left parietal/left temporal regions with extensions into basal ganglia, secondary dissection with IVH and surrounding vasogenic edema with regional mass effect.  She also had several episodes of vomiting with concern for aspiration.  Neurosurgery, neurology were consulted, she was intubated and admitted to neuro ICU.  She was started on 3% hypertonic saline and palliative care was consulted due to poor prognosis.  She eventually underwent tracheostomy as well as PEG placement, and was also started on Keppra due to concern for seizures.  She is currently on trach collar.  Multiple palliative care discussions have been ongoing with the family, at 1 point hospital leadership was involved, she remains full code/full scope.  Earliest she could be placed is a month out from her tracheostomy placement, 2/12.   Significant events: 12/21 - Presented to [facility] ED via EMS as Code Stroke. LKW 2000. Abrupt onset of decreased responsiveness, aphasia and R hemiplegia. Stroke admitting, NSGY consulted. PCCM consult for vent management. Treated with unasyn for aspiration pneumonia. CT Head with large L hemispheric parietal/temporal lobe IPH with extension into basal ganglia and IVH. HTS 3% started.   12/24 - Palliative care consulted, family requested meeting to be delayed 12/25 - Family requested goals of care meeting to be delayed 12/26 - Stopped Unasyn, replete phos; [name] GOC conversation: family says full code 12/27 - Restarted Unasyn for fever, RLL infiltrate; resp culture: enterobacter, MDR. Palliative consulted  12/28 - CT Head unchanged 12/29 - Unasyn  changed to bactrim 12/31 - Posturing, CT head with increased shift, increased bleeding 1/2 - Family fired CVA service after discussion RE poor prognosis  1/4 - Off sedation  1/5 - No acute issues overnight. Remains on vent with increased respirations off sedation. Fever curve slightly decreased  1/6 - Pall care meeting moved to 2pm today. [name] the son-in-law will be present. On ven 30%/ On TF. Neo not started last night. MAP > 65 after fluids bolus. Febrile +.  Last CT head 12/09/22.  1/8 - No significant neurologic/clinical change. Brief GOC discussion with husband, [name], at bedside in AM. Concerns from staff re: patient GOC/decisionmaking, want to ensure husband is included in decisions/information sharing. Attempted bedside meeting but family prematurely ended meeting due to escalated discussion. 1/9 - Febrile overnight to 101.67F, remains on bromocriptine. No significant neurologic exam changes. 1/10 Family meeting yesterday with [name], plan for trach/peg though husband says they are still discussing this morning. Per Dt. Hulen Skains " So moving forward, [name] is the designated medical decision maker for Pamela Johnston " 1/11 no new issues, plan for PEG tomorrow 1/12 for trach/peg today 1/15 No change in Neuro status , trach and PEG, No weaning , will repeat CT head without, get Case management involved in looking for LTAC ( Day 3 post trach on 1/12)   1/15-CT with some stabilization 1/19 No acute events overnight, respiratory culture 1/15 with abundant Pseudomonas, abundant Staphylococcus aureus, and moderate Enterobacter remains on Meropenem  1/22 trach collar trial 12 + hrs 1/24 trach collar x 24 hours on 35% 1/25- concern for seizures-- keppra started after EEG done 2/12- concern for seizures-- EEG reordered  Subjective / 24h Interval events:  Unresponsive.  No events  Assesement and Plan: Principal Problem:   ICH (intracerebral hemorrhage) (HCC) Active Problems:   RLS  (restless legs syndrome)   Osteopenia   Sacroiliitis (HCC)   Benign essential HTN   Aspiration into airway   Hypoxia   Intraparenchymal hemorrhage of brain (HCC)   Brain compression (HCC)   Acute respiratory failure with hypoxia (HCC)   Pneumonia due to Enterobacter aerogenes (HCC)   Goals of care, counseling/discussion   Pressure injury of skin   Tracheostomy status ([place])  Principal problem 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/mass effect Brain compression/ICH Right parietal lobe hemorrhage with associated edema  Severe encephalopathy Intraventricular hemorrhage and mass effect, vasogenic edema, subfalcine herniation -Per prior team members, she is a very poor prognosis.  Multiple family meetings concluded over time with CMO on board, medical decision maker is [name] patient's son-in-law. Stroke team signed off. Maintain neuro protective measures. Nutrition and bowel regimen. Aspiration precautions   Active problems Fevers - Continue to closely monitor, chest x-ray showed minimal atelectasis but no clear-cut infiltrates.  Prior respiratory cultures showing Pseudomonas, most recent on 01/03/2023 showing Carbapenem resistance.  She has been treated for pneumonia in the past, monitor off antibiotics, but if needed could use Zerbaxa per ID given resistance.  D-dimer is elevated, but lower extremity ultrasound is negative for DVT.  PE is in the differential however she is not having worsening respiratory status, may not be stable to transfer down for CT angiogram, and also she could not be anticoagulated due to her devastating hemorrhagic stroke  Concern for seizures - On Keppra, underwent a repeat EEG on 2/12 and repeat neurology consulted, due to left sided rhythmic movements.  EEG did not show any seizures.  Neurology signed off 2/30   Acute respiratory failure with hypoxia due to large intracerebral hemorrhage - S/p trach/PEG 1/12.  Previously completed 10 days of antibiotic coverage for anterobasilar pneumonia, repeat sputum culture obtained 1/15 positive for abundant Pseudomonas, abundant Staphylococcus aureus, and moderate Enterobacter, s/p course of Meropenem -Continue ATC as tolerated, hope to avoid vent needs -PRN lasix -Wean O2 down as tolerated -Bronchial hygiene -Appreciate PCCM follow-up, last seen 2/12   Urinary retention -foley placed 1/23, Will need urologic follow up as outpatient   History of chronic sacroiliitis Osteopenia RLS -Supportive care  Scheduled Meds:  Chlorhexidine Gluconate Cloth  6 each Topical Daily   famotidine  20 mg Per Tube QHS   feeding supplement (PROSource TF20)  60 mL Per Tube Daily   fiber supplement (BANATROL TF)  60 mL Per Tube BID   Gerhardt's butt cream   Topical QID   latanoprost  1 drop Both Eyes QHS   levETIRAcetam  500 mg Per Tube BID   metoprolol tartrate  12.5 mg Per Tube BID   nutrition supplement (JUVEN)  1 packet Per Tube BID BM   mouth rinse  15 mL Mouth Rinse Q2H   phenylephrine-shark liver oil-mineral oil-petrolatum  1 Application Rectal BID   rosuvastatin  20 mg Per Tube Daily   Continuous Infusions:  sodium chloride Stopped (01/01/23 0424)   feeding supplement (JEVITY 1.2 CAL) 1,000 mL (01/26/23 1738)   PRN Meds:.acetaminophen **OR** acetaminophen (TYLENOL) oral liquid 160 mg/5 mL **OR** acetaminophen, bisacodyl, docusate, loperamide HCl, morphine injection, mouth rinse, polyethylene glycol  Current Outpatient Medications  Medication Instructions   latanoprost (XALATAN) 0.005 % ophthalmic solution 1 drop, Both Eyes, Daily at bedtime   oxyCODONE-acetaminophen (PERCOCET/ROXICET) 5-325 MG tablet 1 tablet,  Oral, Every 4 hours PRN   pramipexole (MIRAPEX) 0.25 mg, Oral, Nightly    Diet Orders (From admission, onward)     Start     Ordered   12/19/22 0001  Diet NPO time specified  Diet effective midnight        12/18/22 1632            DVT  prophylaxis: SCD's Start: 11/29/22 2215   Lab Results  Component Value Date   PLT 316 01/25/2023      Code Status: Full Code  Family Communication: No family at bedside  Status is: Inpatient  Remains inpatient appropriate because: needs placement  Level of care: Progressive  Consultants:  Neurology Neurosurgery PCCM Palliative care  Objective: Vitals:   01/27/23 0302 01/27/23 0355 01/27/23 0823 01/27/23 0911  BP: 121/61  134/72 117/62  Pulse: 94 94 (!) 105 100  Resp: (!) 24 (!) 24 (!) 27 (!) 26  Temp: 98.6 F (37 C)   98.5 F (36.9 C)  TempSrc: Axillary   Axillary  SpO2: 95% 95% 95% 96%  Weight:      Height:        Intake/Output Summary (Last 24 hours) at 01/27/2023 0939 Last data filed at 01/27/2023 0915 Gross per 24 hour  Intake --  Output 1750 ml  Net -1750 ml    Wt Readings from Last 3 Encounters:  01/23/23 60.2 kg    Examination: Constitutional: NAD Respiratory: CTA Cardiovascular: RRR  Data Reviewed: I have independently reviewed following labs and imaging studies   CBC Recent Labs  Lab 01/25/23 0326  WBC 7.7  HGB 10.7*  HCT 34.3*  PLT 316  MCV 98.3  MCH 30.7  MCHC 31.2  RDW 15.7*     Recent Labs  Lab 01/25/23 0326  NA 138  K 3.9  CL 102  CO2 28  GLUCOSE 126*  BUN 33*  CREATININE 0.41*  CALCIUM 8.3*  AST 41  ALT 26  ALKPHOS 188*  BILITOT 0.1*  ALBUMIN 2.2*  MG 2.3     ------------------------------------------------------------------------------------------------------------------ No results for input(s): "CHOL", "HDL", "LDLCALC", "TRIG", "CHOLHDL", "LDLDIRECT" in the last 72 hours.  Lab Results  Component Value Date   HGBA1C 5.5 12/01/2022   ------------------------------------------------------------------------------------------------------------------ No results for input(s): "TSH", "T4TOTAL", "T3FREE", "THYROIDAB" in the last 72 hours.  Invalid input(s): "FREET3"  Cardiac Enzymes No results for  input(s): "CKMB", "TROPONINI", "MYOGLOBIN" in the last 168 hours.  Invalid input(s): "CK" ------------------------------------------------------------------------------------------------------------------ No results found for: "BNP"  CBG: Recent Labs  Lab 01/20/23 1638 01/20/23 1954 01/21/23 1021 01/21/23 1246 01/21/23 1649  GLUCAP 137* 135* 138* 130* 152*     No results found for this or any previous visit (from the past 240 hour(s)).    Radiology Studies: No results found.   Anniya Whiters Karlyne Greenspan, MD, PhD [facility]  Between 7 am - 7 pm I am available, please contact me via Amion (for emergencies) or Securechat (non urgent messages)  Between 7 pm - 7 am I am not available, please contact night coverage MD/APP via Amion

## 2023-01-28 DIAGNOSIS — I612 Nontraumatic intracerebral hemorrhage in hemisphere, unspecified: Secondary | ICD-10-CM | POA: Diagnosis not present

## 2023-01-28 LAB — CBC
HCT: 38.2 % (ref 36.0–46.0)
Hemoglobin: 11.9 g/dL — ABNORMAL LOW (ref 12.0–15.0)
MCH: 30.7 pg (ref 26.0–34.0)
MCHC: 31.2 g/dL (ref 30.0–36.0)
MCV: 98.5 fL (ref 80.0–100.0)
Platelets: 364 10*3/uL (ref 150–400)
RBC: 3.88 MIL/uL (ref 3.87–5.11)
RDW: 15.6 % — ABNORMAL HIGH (ref 11.5–15.5)
WBC: 9.4 10*3/uL (ref 4.0–10.5)
nRBC: 0 % (ref 0.0–0.2)

## 2023-01-28 LAB — BASIC METABOLIC PANEL
Anion gap: 12 (ref 5–15)
BUN: 25 mg/dL — ABNORMAL HIGH (ref 8–23)
CO2: 26 mmol/L (ref 22–32)
Calcium: 8.8 mg/dL — ABNORMAL LOW (ref 8.9–10.3)
Chloride: 104 mmol/L (ref 98–111)
Creatinine, Ser: 0.58 mg/dL (ref 0.44–1.00)
GFR, Estimated: >60 mL/min (ref >60)
Glucose, Bld: 121 mg/dL — ABNORMAL HIGH (ref 70–99)
Potassium: 4.3 mmol/L (ref 3.5–5.1)
Sodium: 142 mmol/L (ref 135–145)

## 2023-01-28 NOTE — Progress Notes (Signed)
NAME:  Pamela Johnston, MRN:  [id number], DOB:  [birth year], LOS: 66 ADMISSION DATE:  11/29/2022, CONSULTATION DATE:  11/29/2022 REFERRING MD:  Leonel Ramsay - Neuro REASON FOR CONSULT: Ventilator management in setting of large IPH  History of Present Illness:  88-yer-old woman who presented to [facility] ED 12/21 as a Code Stroke. LKW 2000 when patient reportedly was in bed and became less responsive with +aphasia and R-sided weakness. One episode of nausea/vomiting en route with EMS. PMHx significant for HTN, chronic sacroiliitis (managed with Percocet), RLS, osteopenia.   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 Labs were grossly unremarkable with WBC mildly elevated to 12 and mild hyperglycemia to 154. Decision was made to intubate patient in ED after several episodes of emesis with concern for aspiration. HTS 3% was initiated.   Stroke service to admit. NSGY consulted with no plan for intervention at this time.   PCCM consulted for ventilator/hemodynamic management.   Pertinent Medical History:  Hypertension Sacroilititis Restless legs syndrome Osteopenia  Significant Hospital Events: Including procedures, antibiotic start and stop dates in addition to other pertinent events   12/21 - Presented to [facility] ED via EMS as Code Stroke. LKW 2000. Abrupt onset of decreased responsiveness, aphasia and R hemiplegia. Stroke admitting, NSGY consulted. PCCM consult for vent management. Treated with unasyn for aspiration pneumonia. CT Head with large L hemispheric parietal/temporal lobe IPH with extension into basal ganglia and IVH. HTS 3% started.   12/24 - Palliative care consulted, family requested meeting to be delayed 12/25 - Family requested goals of care meeting to be delayed 12/26 - Stopped  Unasyn, replete phos; [name] GOC conversation: family says full code 12/27 - Restarted Unasyn for fever, RLL infiltrate; resp culture: enterobacter, MDR. Palliative consulted  12/28 - CT Head unchanged 12/29 - Unasyn changed to bactrim 12/31 - Posturing, CT head with increased shift, increased bleeding 1/2 - Family fired CVA service after discussion RE poor prognosis  1/4 - Off sedation  1/5 - No acute issues overnight. Remains on vent with increased respirations off sedation. Fever curve slightly decreased  1/6 - Pall care meeting moved to 2pm today. [name] the son-in-law will be present. On ven 30%/ On TF. Neo not started last night. MAP > 65 after fluids bolus. Febrile +.  Last CT head 12/09/22.  1/8 - No significant neurologic/clinical change. Brief GOC discussion with husband, Pamela Johnston, at bedside in AM. Concerns from staff re: patient GOC/decisionmaking, want to ensure husband is included in decisions/information sharing. Attempted bedside meeting but family prematurely ended meeting due to escalated discussion. 1/9 - Febrile overnight to 101.89F, remains on bromocriptine. No significant neurologic exam changes. 1/10 Family meeting yesterday with [name], plan for trach/peg though husband says they are still discussing this morning. Per Dt. Hulen Skains " So moving forward, [name] is the designated medical decision maker for Pamela Johnston " 1/11 no new issues, plan for PEG tomorrow 1/12 for trach/peg today 1/15 No change in Neuro status , trach and PEG, No weaning , will repeat CT head without, get Case management involved in looking for LTAC ( Day 3 post trach on 1/12)   1/15-CT with some stabilization 1/19 No acute events overnight, respiratory culture 1/15 with abundant Pseudomonas, abundant Staphylococcus aureus, and moderate Enterobacter remains on Meropenem  1/22 trach collar trial 12 +  hrs 1/24 trach collar x 24 hours on 35% 1/29 order placed to change trach to cuffless  2/12 No issues  on ATC collar with room air not oxygen  2/19 tolerating trach collar, remains unresponsive  Interim History / Subjective:  Unresponsive but appears comfortable   Objective:   Blood pressure 130/60, pulse (!) 107, temperature 98.7 F (37.1 C), temperature source Oral, resp. rate (!) 30, height 5' 1"$  (1.549 m), weight 60.2 kg, SpO2 98 %.    FiO2 (%):  [21 %] 21 %   Intake/Output Summary (Last 24 hours) at 01/28/2023 1554 Last data filed at 01/28/2023 [id number] Gross per 24 hour  Intake --  Output 600 ml  Net -600 ml    Filed Weights   01/22/23 0403 01/23/23 0500 01/28/23 0500  Weight: 60.2 kg 60.2 kg 60.2 kg   Physical Exam: General: Acute on chronically ill appearing unresponsive  female lying in bed, in NAD HEENT: ETT, MM pink/moist, PERRL,  Neuro: Unresponsive  CV: s1s2 regular rate and rhythm, no murmur, rubs, or gallops,  PULM:  6 cuffless trach midline, no increased work of breathing, no added breath sounds  GI: soft, bowel sounds active in all 4 quadrants, non-tender, non-distended, tolerating TF Extremities: warm/dry, no edema  Skin: no rashes or lesions  Resolved Hospital Problem List:  Hypotension Hyperglycemia  Enterobacter pneumonia -Completed 10 days of treatment for  Circulatory shock  Assessment & Plan:   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

## 2023-01-28 NOTE — Progress Notes (Addendum)
PROGRESS NOTE  Pamela Johnston [id number] DOB: [birth year] DOA: 11/29/2022 PCP: [name], MD   LOS: 60 days   Brief Narrative / Interim history: 82 year old female with history of hypertension who comes into the hospital and is admitted on 11/29/2022 due to decreased responsiveness, aphasia, right-sided weakness.  CT head on admission showed large IPH of left parietal/left temporal regions with extensions into basal ganglia, secondary dissection with IVH and surrounding vasogenic edema with regional mass effect.  She also had several episodes of vomiting with concern for aspiration.  Neurosurgery, neurology were consulted, she was intubated and admitted to neuro ICU.  She was started on 3% hypertonic saline and palliative care was consulted due to poor prognosis.  She eventually underwent tracheostomy as well as PEG placement, and was also started on Keppra due to concern for seizures.  She is currently on trach collar.  Multiple palliative care discussions have been ongoing with the family, at 1 point hospital leadership was involved, she remains full code/full scope.  Earliest she could be placed is a month out from her tracheostomy placement, 2/12.   Significant events: 12/21 - Presented to [facility] ED via EMS as Code Stroke. LKW 2000. Abrupt onset of decreased responsiveness, aphasia and R hemiplegia. Stroke admitting, NSGY consulted. PCCM consult for vent management. Treated with unasyn for aspiration pneumonia. CT Head with large L hemispheric parietal/temporal lobe IPH with extension into basal ganglia and IVH. HTS 3% started.   12/24 - Palliative care consulted, family requested meeting to be delayed 12/25 - Family requested goals of care meeting to be delayed 12/26 - Stopped Unasyn, replete phos; [name] GOC conversation: family says full code 12/27 - Restarted Unasyn for fever, RLL infiltrate; resp culture: enterobacter, MDR. Palliative consulted  12/28 - CT Head unchanged 12/29 - Unasyn  changed to bactrim 12/31 - Posturing, CT head with increased shift, increased bleeding 1/2 - Family fired CVA service after discussion RE poor prognosis  1/4 - Off sedation  1/5 - No acute issues overnight. Remains on vent with increased respirations off sedation. Fever curve slightly decreased  1/6 - Pall care meeting moved to 2pm today. [name] the son-in-law will be present. On ven 30%/ On TF. Neo not started last night. MAP > 65 after fluids bolus. Febrile +.  Last CT head 12/09/22.  1/8 - No significant neurologic/clinical change. Brief GOC discussion with husband, Louie Johnston, at bedside in AM. Concerns from staff re: patient GOC/decisionmaking, want to ensure husband is included in decisions/information sharing. Attempted bedside meeting but family prematurely ended meeting due to escalated discussion. 1/9 - Febrile overnight to 101.51F, remains on bromocriptine. No significant neurologic exam changes. 1/10 Family meeting yesterday with [name], plan for trach/peg though husband says they are still discussing this morning. Per Dt. Hulen Skains " So moving forward, Pamela Johnston is the designated medical decision maker for Pamela Johnston " 1/11 no new issues, plan for PEG tomorrow 1/12 for trach/peg today 1/15 No change in Neuro status , trach and PEG, No weaning , will repeat CT head without, get Case management involved in looking for LTAC ( Day 3 post trach on 1/12)   1/15-CT with some stabilization 1/19 No acute events overnight, respiratory culture 1/15 with abundant Pseudomonas, abundant Staphylococcus aureus, and moderate Enterobacter remains on Meropenem  1/22 trach collar trial 12 + hrs 1/24 trach collar x 24 hours on 35% 1/25- concern for seizures-- keppra started after EEG done 2/12- concern for seizures-- EEG reordered  Subjective / 24h Interval events:  No events.  Unresponsive  Assesement and Plan: Principal Problem:   ICH (intracerebral hemorrhage) (HCC) Active Problems:   RLS  (restless legs syndrome)   Osteopenia   Sacroiliitis (HCC)   Benign essential HTN   Aspiration into airway   Hypoxia   Intraparenchymal hemorrhage of brain (HCC)   Brain compression (HCC)   Acute respiratory failure with hypoxia (HCC)   Pneumonia due to Enterobacter aerogenes (HCC)   Goals of care, counseling/discussion   Pressure injury of skin   Tracheostomy status ([place])  Principal problem 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/mass effect Brain compression/ICH Right parietal lobe hemorrhage with associated edema  Severe encephalopathy Intraventricular hemorrhage and mass effect, vasogenic edema, subfalcine herniation -Per prior team members, she has a very poor prognosis.  Multiple family meetings concluded over time with CMO on board, medical decision maker is [name] patient's son-in-law. Stroke team signed off. Maintain neuro protective measures. Nutrition and bowel regimen. Aspiration precautions   Active problems Fevers - Continue to closely monitor, chest x-ray showed minimal atelectasis but no clear-cut infiltrates.  Prior respiratory cultures showing Pseudomonas, most recent on 01/03/2023 showing Carbapenem resistance.  She has been treated for pneumonia in the past, monitor off antibiotics, but if needed could use Zerbaxa per ID given resistance.  D-dimer was elevated, lower extremity ultrasound was negative for DVT.  PE is in the differential however she is not having worsening respiratory status, may not be stable to transfer down for CT angiogram, and also she could not be anticoagulated due to her devastating hemorrhagic stroke  Concern for seizures - On Keppra, underwent a repeat EEG on 2/12 and repeat neurology consulted, due to left sided rhythmic movements.  EEG did not show any seizures.  Neurology signed off 1/30   Acute respiratory failure with hypoxia due to large intracerebral hemorrhage - S/p trach/PEG 1/12.  Previously completed 10 days of antibiotic coverage for anterobasilar pneumonia, repeat sputum culture obtained 1/15 positive for abundant Pseudomonas, abundant Staphylococcus aureus, and moderate Enterobacter, s/p course of Meropenem -Continue ATC as tolerated, hope to avoid vent needs -PRN lasix -Wean O2 down as tolerated -Bronchial hygiene -Appreciate PCCM follow-up, last seen 2/12, she is being seen weekly   Urinary retention -foley placed 1/23, Will need urologic follow up as outpatient   History of chronic sacroiliitis Osteopenia RLS -Supportive care  Scheduled Meds:  Chlorhexidine Gluconate Cloth  6 each Topical Daily   famotidine  20 mg Per Tube QHS   feeding supplement (PROSource TF20)  60 mL Per Tube Daily   fiber supplement (BANATROL TF)  60 mL Per Tube BID   Gerhardt's butt cream   Topical QID   latanoprost  1 drop Both Eyes QHS   levETIRAcetam  500 mg Per Tube BID   metoprolol tartrate  12.5 mg Per Tube BID   nutrition supplement (JUVEN)  1 packet Per Tube BID BM   mouth rinse  15 mL Mouth Rinse Q2H   phenylephrine-shark liver oil-mineral oil-petrolatum  1 Application Rectal BID   rosuvastatin  20 mg Per Tube Daily   Continuous Infusions:  sodium chloride Stopped (01/01/23 0424)   feeding supplement (JEVITY 1.2 CAL) 1,000 mL (01/28/23 0844)   PRN Meds:.acetaminophen **OR** acetaminophen (TYLENOL) oral liquid 160 mg/5 mL **OR** acetaminophen, bisacodyl, docusate, loperamide HCl, morphine injection, mouth rinse, polyethylene glycol  Current Outpatient Medications  Medication Instructions   latanoprost (XALATAN) 0.005 % ophthalmic solution 1 drop, Both Eyes, Daily at bedtime   oxyCODONE-acetaminophen (PERCOCET/ROXICET) 5-325  MG tablet 1 tablet, Oral, Every 4 hours PRN   pramipexole (MIRAPEX) 0.25 mg, Oral, Nightly    Diet Orders (From admission, onward)     Start     Ordered   12/19/22 0001  Diet NPO time specified  Diet effective midnight        12/18/22 1632             DVT prophylaxis: SCD's Start: 11/29/22 2215   Lab Results  Component Value Date   PLT 364 01/28/2023      Code Status: Full Code  Family Communication: No family at bedside  Status is: Inpatient  Remains inpatient appropriate because: needs placement  Level of care: Progressive  Consultants:  Neurology Neurosurgery PCCM Palliative care  Objective: Vitals:   01/28/23 0410 01/28/23 0500 01/28/23 0800 01/28/23 0852  BP:   138/73 138/73  Pulse:   (!) 122 (!) 122  Resp:   (!) 36 (!) 29  Temp:   97.9 F (36.6 C)   TempSrc:   Oral   SpO2: 93%  97% 95%  Weight:  60.2 kg    Height:        Intake/Output Summary (Last 24 hours) at 01/28/2023 0953 Last data filed at 01/28/2023 0328 Gross per 24 hour  Intake --  Output 600 ml  Net -600 ml    Wt Readings from Last 3 Encounters:  01/28/23 60.2 kg    Examination: Constitutional: NAD Respiratory: CTA Cardiovascular: RRR  Data Reviewed: I have independently reviewed following labs and imaging studies   CBC Recent Labs  Lab 01/25/23 0326 01/28/23 0606  WBC 7.7 9.4  HGB 10.7* 11.9*  HCT 34.3* 38.2  PLT 316 364  MCV 98.3 98.5  MCH 30.7 30.7  MCHC 31.2 31.2  RDW 15.7* 15.6*     Recent Labs  Lab 01/25/23 0326 01/28/23 0606  NA 138 142  K 3.9 4.3  CL 102 104  CO2 28 26  GLUCOSE 126* 121*  BUN 33* 25*  CREATININE 0.41* 0.58  CALCIUM 8.3* 8.8*  AST 41  --   ALT 26  --   ALKPHOS 188*  --   BILITOT 0.1*  --   ALBUMIN 2.2*  --   MG 2.3  --      ------------------------------------------------------------------------------------------------------------------ No results for input(s): "CHOL", "HDL", "LDLCALC", "TRIG", "CHOLHDL", "LDLDIRECT" in the last 72 hours.  Lab Results  Component Value Date   HGBA1C 5.5 12/01/2022   ------------------------------------------------------------------------------------------------------------------ No results for input(s): "TSH", "T4TOTAL",  "T3FREE", "THYROIDAB" in the last 72 hours.  Invalid input(s): "FREET3"  Cardiac Enzymes No results for input(s): "CKMB", "TROPONINI", "MYOGLOBIN" in the last 168 hours.  Invalid input(s): "CK" ------------------------------------------------------------------------------------------------------------------ No results found for: "BNP"  CBG: Recent Labs  Lab 01/21/23 1021 01/21/23 1246 01/21/23 1649  GLUCAP 138* 130* 152*     No results found for this or any previous visit (from the past 240 hour(s)).    Radiology Studies: No results found.   Gerrald Basu Karlyne Greenspan, MD, PhD [facility]  Between 7 am - 7 pm I am available, please contact me via Amion (for emergencies) or Securechat (non urgent messages)  Between 7 pm - 7 am I am not available, please contact night coverage MD/APP via Amion

## 2023-01-28 NOTE — Progress Notes (Signed)
Patient has been added to Difficult to Place list for discharge planning.   Please contact me directly for any updates or questions regarding discharge planning.  Madilyn Fireman, MSW, LCSW Transitions of Care  Clinical Social Worker II [phone]

## 2023-01-29 DIAGNOSIS — I612 Nontraumatic intracerebral hemorrhage in hemisphere, unspecified: Secondary | ICD-10-CM | POA: Diagnosis not present

## 2023-01-29 LAB — GLUCOSE, CAPILLARY
Glucose-Capillary: 110 mg/dL — ABNORMAL HIGH (ref 70–99)
Glucose-Capillary: 128 mg/dL — ABNORMAL HIGH (ref 70–99)
Glucose-Capillary: 115 mg/dL — ABNORMAL HIGH (ref 70–99)
Glucose-Capillary: 112 mg/dL — ABNORMAL HIGH (ref 70–99)
Glucose-Capillary: 151 mg/dL — ABNORMAL HIGH (ref 70–99)

## 2023-01-29 NOTE — Progress Notes (Signed)
Physical Therapy Treatment Patient Details Name: Pamela Johnston MRN: [id number] DOB: 01/20/1941 Today's Date: 01/29/2023   History of Present Illness 59-yer-old woman who presented to [facility] ED 12/21 as a Code Stroke with +aphasia and R-sided weakness. 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 Pt underwent trach and peg on 12/19/22. PMHx significant for HTN, chronic sacroiliitis (managed with Percocet), RLS, osteopenia.    PT Comments    Pt with no functional progress to date. Pt remains dependent for all mobility. Pt with no command follow or eye opening to noxious or auditory stimuli. Pt with noted R forearm/hand swelling more than previous sessions. RN notified. PT to continue to monitor pt 1x/wk as pt remains full code and family desires pt to go to rehab.     Recommendations for follow up therapy are one component of a multi-disciplinary discharge planning process, led by the attending physician.  Recommendations may be updated based on patient status, additional functional criteria and insurance authorization.  Follow Up Recommendations  Long-term institutional care without follow-up therapy Can patient physically be transported by private vehicle: No   Assistance Recommended at Discharge Frequent or constant Supervision/Assistance  Patient can return home with the following Two people to help with walking and/or transfers;Two people to help with bathing/dressing/bathroom;Assistance with feeding;Assist for transportation;Direct supervision/assist for medications management   Equipment Recommendations  None recommended by PT    Recommendations for Other Services       Precautions / Restrictions Precautions Precautions: Fall Precaution Comments: trach, PEG Restrictions Weight Bearing Restrictions: No     Mobility  Bed Mobility Overal bed mobility: Needs  Assistance Bed Mobility: Rolling Rolling: Total assist, +2 for physical assistance         General bed mobility comments: pt with no initiation or participation in rolling    Transfers                   General transfer comment: unable    Ambulation/Gait               General Gait Details: unable   Stairs             Wheelchair Mobility    Modified Rankin (Stroke Patients Only) Modified Rankin (Stroke Patients Only) Pre-Morbid Rankin Score: Moderately severe disability Modified Rankin: Severe disability     Balance Overall balance assessment: Needs assistance Sitting-balance support: Feet unsupported, No upper extremity supported Sitting balance-Leahy Scale: Zero Sitting balance - Comments: reliant on support of bed in egress position                                    Cognition Arousal/Alertness: Lethargic Behavior During Therapy: Flat affect Overall Cognitive Status: Impaired/Different from baseline                                 General Comments: pt with no opening of eyes to loud auditory stimuli or to name. Pt with no command follow,        Exercises Other Exercises Other Exercises: PROM x4 extremities in supine noted limitations in L knee extension, L hip abduction/ER and R shoulder flexion/ER/ABD more than opposite extremities.    General Comments General comments (skin integrity, edema, etc.): pt with R forearm/hand swelling, RN  notified. Pt's L nails were digging into R UE, PT re-positioned UEs for optimal support      Pertinent Vitals/Pain Pain Assessment Pain Assessment: Faces Faces Pain Scale: No hurt Pain Location: grimacing with R hand ROM and with trach suctioning Pain Descriptors / Indicators: Grimacing    Home Living                          Prior Function            PT Goals (current goals can now be found in the care plan section) Acute Rehab PT Goals Patient Stated  Goal: unable to state PT Goal Formulation: Patient unable to participate in goal setting Time For Goal Achievement: 02/06/23 Potential to Achieve Goals: Poor Progress towards PT goals: Not progressing toward goals - comment    Frequency    Min 1X/week      PT Plan Current plan remains appropriate    Co-evaluation              AM-PAC PT "6 Clicks" Mobility   Outcome Measure  Help needed turning from your back to your side while in a flat bed without using bedrails?: Total Help needed moving from lying on your back to sitting on the side of a flat bed without using bedrails?: Total Help needed moving to and from a bed to a chair (including a wheelchair)?: Total Help needed standing up from a chair using your arms (e.g., wheelchair or bedside chair)?: Total Help needed to walk in hospital room?: Total Help needed climbing 3-5 steps with a railing? : Total 6 Click Score: 6    End of Session Equipment Utilized During Treatment: Oxygen Activity Tolerance: Patient limited by lethargy Patient left: in bed;with call bell/phone within reach;with bed alarm set;with nursing/sitter in room Nurse Communication: Mobility status PT Visit Diagnosis: Difficulty in walking, not elsewhere classified (R26.2);Unsteadiness on feet (R26.81)     Time: [id number] PT Time Calculation (min) (ACUTE ONLY): 11 min  Charges:  $Therapeutic Exercise: 8-22 mins                     Pamela Johnston, PT, DPT Acute Rehabilitation Services Secure chat preferred Office #: [phone]    Pamela Johnston 01/29/2023, 10:55 AM

## 2023-01-29 NOTE — Care Management Important Message (Signed)
Important Message  Patient Details  Name: Pamela Johnston MRN: [id number] Date of Birth: [birth year]   Medicare Important Message Given:  Yes     Hannah Beat 01/29/2023, 9:39 AM

## 2023-01-29 NOTE — Progress Notes (Signed)
PROGRESS NOTE  Pamela Johnston [id number] DOB: 1941-07-01 DOA: 11/29/2022 PCP: [name], MD   LOS: 61 days   Brief Narrative / Interim history: 82 year old female with history of hypertension who comes into the hospital and is admitted on 11/29/2022 due to decreased responsiveness, aphasia, right-sided weakness.  CT head on admission showed large IPH of left parietal/left temporal regions with extensions into basal ganglia, secondary dissection with IVH and surrounding vasogenic edema with regional mass effect.  She also had several episodes of vomiting with concern for aspiration.  Neurosurgery, neurology were consulted, she was intubated and admitted to neuro ICU.  She was started on 3% hypertonic saline and palliative care was consulted due to poor prognosis.  She eventually underwent tracheostomy as well as PEG placement, and was also started on Keppra due to concern for seizures.  She is currently on trach collar.  Multiple palliative care discussions have been ongoing with the family, at 1 point hospital leadership was involved, she remains full code/full scope.  Earliest she could be placed is a month out from her tracheostomy placement, 2/12.  Now on the difficult to place list   Significant events: 12/21 - Presented to [facility] ED via EMS as Code Stroke. LKW 2000. Abrupt onset of decreased responsiveness, aphasia and R hemiplegia. Stroke admitting, NSGY consulted. PCCM consult for vent management. Treated with unasyn for aspiration pneumonia. CT Head with large L hemispheric parietal/temporal lobe IPH with extension into basal ganglia and IVH. HTS 3% started.   12/24 - Palliative care consulted, family requested meeting to be delayed 12/25 - Family requested goals of care meeting to be delayed 12/26 - Stopped Unasyn, replete phos; [name] GOC conversation: family says full code 12/27 - Restarted Unasyn for fever, RLL infiltrate; resp culture: enterobacter, MDR. Palliative consulted  12/28  - CT Head unchanged 12/29 - Unasyn changed to bactrim 12/31 - Posturing, CT head with increased shift, increased bleeding 1/2 - Family fired CVA service after discussion RE poor prognosis  1/4 - Off sedation  1/5 - No acute issues overnight. Remains on vent with increased respirations off sedation. Fever curve slightly decreased  1/6 - Pall care meeting moved to 2pm today. [name] the son-in-law will be present. On ven 30%/ On TF. Neo not started last night. MAP > 65 after fluids bolus. Febrile +.  Last CT head 12/09/22.  1/8 - No significant neurologic/clinical change. Brief GOC discussion with husband, Pamela Johnston, at bedside in AM. Concerns from staff re: patient GOC/decisionmaking, want to ensure husband is included in decisions/information sharing. Attempted bedside meeting but family prematurely ended meeting due to escalated discussion. 1/9 - Febrile overnight to 101.18F, remains on bromocriptine. No significant neurologic exam changes. 1/10 Family meeting yesterday with [name], plan for trach/peg though husband says they are still discussing this morning. Per Dt. Hulen Skains " So moving forward, Pamela Johnston is the designated medical decision maker for [place]ak " 1/11 no new issues, plan for PEG tomorrow 1/12 for trach/peg today 1/15 No change in Neuro status , trach and PEG, No weaning , will repeat CT head without, get Case management involved in looking for LTAC ( Day 3 post trach on 1/12)   1/15-CT with some stabilization 1/19 No acute events overnight, respiratory culture 1/15 with abundant Pseudomonas, abundant Staphylococcus aureus, and moderate Enterobacter remains on Meropenem  1/22 trach collar trial 12 + hrs 1/24 trach collar x 24 hours on 35% 1/25- concern for seizures-- keppra started after EEG done 2/12- concern for seizures--  EEG reordered, negative 2/19 -PCCM saw for trach maintenance  Subjective / 24h Interval events: No events.  Unresponsive  Assesement and  Plan: Principal Problem:   ICH (intracerebral hemorrhage) (HCC) Active Problems:   RLS (restless legs syndrome)   Osteopenia   Sacroiliitis (HCC)   Benign essential HTN   Aspiration into airway   Hypoxia   Intraparenchymal hemorrhage of brain (HCC)   Brain compression (HCC)   Acute respiratory failure with hypoxia (HCC)   Pneumonia due to Enterobacter aerogenes (HCC)   Goals of care, counseling/discussion   Pressure injury of skin   Tracheostomy status ([place])  Principal problem 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/mass effect Brain compression/ICH Right parietal lobe hemorrhage with associated edema  Severe encephalopathy Intraventricular hemorrhage and mass effect, vasogenic edema, subfalcine herniation -Per prior team members, she has a very poor prognosis.  Multiple family meetings concluded over time with CMO on board, medical decision maker is Pamela Johnston patient's son-in-law. Stroke team signed off. Maintain neuro protective measures. Nutrition and bowel regimen. Aspiration precautions   Active problems Fevers - Continue to closely monitor, chest x-ray showed minimal atelectasis but no clear-cut infiltrates.  Prior respiratory cultures showing Pseudomonas, most recent on 01/03/2023 showing Carbapenem resistance.  She has been treated for pneumonia in the past, monitor off antibiotics, but if needed could use Zerbaxa per ID given resistance.  D-dimer was elevated, lower extremity ultrasound was negative for DVT.  PE is in the differential however she is not having worsening respiratory status, may not be stable to transfer down for CT angiogram, and also she could not be anticoagulated due to her devastating hemorrhagic stroke  Concern for seizures - On Keppra, underwent a repeat EEG on 2/12 and repeat neurology consulted, due to left sided rhythmic movements.  EEG did not show any seizures.  Neurology signed off   Acute respiratory  failure with hypoxia due to large intracerebral hemorrhage - S/p trach/PEG 1/12. Previously completed 10 days of antibiotic coverage for anterobasilar pneumonia, repeat sputum culture obtained 1/15 positive for abundant Pseudomonas, abundant Staphylococcus aureus, and moderate Enterobacter, s/p course of Meropenem -Continue ATC as tolerated, hope to avoid vent needs -PRN lasix -Wean O2 down as tolerated -Bronchial hygiene -Appreciate PCCM follow-up, last seen 2/12, she is being seen weekly   Urinary retention -foley placed 1/23, Will need urologic follow up as outpatient   History of chronic sacroiliitis Osteopenia RLS -Supportive care  Scheduled Meds:  Chlorhexidine Gluconate Cloth  6 each Topical Daily   famotidine  20 mg Per Tube QHS   feeding supplement (PROSource TF20)  60 mL Per Tube Daily   fiber supplement (BANATROL TF)  60 mL Per Tube BID   Gerhardt's butt cream   Topical QID   latanoprost  1 drop Both Eyes QHS   levETIRAcetam  500 mg Per Tube BID   metoprolol tartrate  12.5 mg Per Tube BID   nutrition supplement (JUVEN)  1 packet Per Tube BID BM   mouth rinse  15 mL Mouth Rinse Q2H   phenylephrine-shark liver oil-mineral oil-petrolatum  1 Application Rectal BID   rosuvastatin  20 mg Per Tube Daily   Continuous Infusions:  sodium chloride Stopped (01/01/23 0424)   feeding supplement (JEVITY 1.2 CAL) 55 mL/hr at 01/28/23 1926   PRN Meds:.acetaminophen **OR** acetaminophen (TYLENOL) oral liquid 160 mg/5 mL **OR** acetaminophen, bisacodyl, docusate, loperamide HCl, morphine injection, mouth rinse, polyethylene glycol  Current Outpatient Medications  Medication Instructions   latanoprost (XALATAN) 0.005 %  ophthalmic solution 1 drop, Both Eyes, Daily at bedtime   oxyCODONE-acetaminophen (PERCOCET/ROXICET) 5-325 MG tablet 1 tablet, Oral, Every 4 hours PRN   pramipexole (MIRAPEX) 0.25 mg, Oral, Nightly    Diet Orders (From admission, onward)     Start     Ordered    12/19/22 0001  Diet NPO time specified  Diet effective midnight        12/18/22 1632            DVT prophylaxis: SCD's Start: 11/29/22 2215   Lab Results  Component Value Date   PLT 364 01/28/2023      Code Status: Full Code  Family Communication: No family at bedside  Status is: Inpatient  Remains inpatient appropriate because: needs placement  Level of care: Progressive  Consultants:  Neurology Neurosurgery PCCM Palliative care  Objective: Vitals:   01/29/23 0303 01/29/23 0307 01/29/23 0500 01/29/23 0740  BP: 130/61   (!) 93/43  Pulse: 91   (!) 116  Resp: 20     Temp: 99 F (37.2 C)     TempSrc: Axillary   Oral  SpO2: 98% 99%  96%  Weight:   60.2 kg   Height:        Intake/Output Summary (Last 24 hours) at 01/29/2023 0904 Last data filed at 01/29/2023 0600 Gross per 24 hour  Intake 2256 ml  Output 600 ml  Net 1656 ml    Wt Readings from Last 3 Encounters:  01/29/23 60.2 kg    Examination: Constitutional: NAD Respiratory: CTA Cardiovascular: RRR  Data Reviewed: I have independently reviewed following labs and imaging studies   CBC Recent Labs  Lab 01/25/23 0326 01/28/23 0606  WBC 7.7 9.4  HGB 10.7* 11.9*  HCT 34.3* 38.2  PLT 316 364  MCV 98.3 98.5  MCH 30.7 30.7  MCHC 31.2 31.2  RDW 15.7* 15.6*     Recent Labs  Lab 01/25/23 0326 01/28/23 0606  NA 138 142  K 3.9 4.3  CL 102 104  CO2 28 26  GLUCOSE 126* 121*  BUN 33* 25*  CREATININE 0.41* 0.58  CALCIUM 8.3* 8.8*  AST 41  --   ALT 26  --   ALKPHOS 188*  --   BILITOT 0.1*  --   ALBUMIN 2.2*  --   MG 2.3  --      ------------------------------------------------------------------------------------------------------------------ No results for input(s): "CHOL", "HDL", "LDLCALC", "TRIG", "CHOLHDL", "LDLDIRECT" in the last 72 hours.  Lab Results  Component Value Date   HGBA1C 5.5 12/01/2022    ------------------------------------------------------------------------------------------------------------------ No results for input(s): "TSH", "T4TOTAL", "T3FREE", "THYROIDAB" in the last 72 hours.  Invalid input(s): "FREET3"  Cardiac Enzymes No results for input(s): "CKMB", "TROPONINI", "MYOGLOBIN" in the last 168 hours.  Invalid input(s): "CK" ------------------------------------------------------------------------------------------------------------------ No results found for: "BNP"  CBG: Recent Labs  Lab 01/29/23 0749  GLUCAP 112*     No results found for this or any previous visit (from the past 240 hour(s)).    Radiology Studies: No results found.   Helga Asbury Karlyne Greenspan, MD, PhD [facility]  Between 7 am - 7 pm I am available, please contact me via Amion (for emergencies) or Securechat (non urgent messages)  Between 7 pm - 7 am I am not available, please contact night coverage MD/APP via Amion

## 2023-01-29 NOTE — Progress Notes (Signed)
Nutrition Follow-up  DOCUMENTATION CODES:   Not applicable  INTERVENTION:   Pt stable on current tube feeding regimen listed below. Do not anticipate need for acute changes to tube feeding regimen. Pt is stable for discharge and is on Difficult to Place list for discharge planning. No additional nutrition interventions warranted at this time. RD will sign-off. Please re-consult as needed.    Continue tube feeding via PEG tube: - Jevity 1.2 @ 55 ml/hr (1320 ml/day) - PROSource TF20 60 ml daily  Tube feeding regimen provides 1664 kcal, 93 grams of protein, and 1065 ml of H2O.   - Continue 1 packet Juven BID per tube, each packet provides 95 calories, 2.5 grams of protein, and 9.8 grams of carbohydrate; also contains L-arginine and L-glutamine, vitamin C, vitamin E, vitamin B-12, zinc, calcium, and calcium Beta-hydroxy-Beta-methylbutyrate to support wound healing  - Continue Banatrol TF 60 ml BID per tube for loose stools  NUTRITION DIAGNOSIS:   Inadequate oral intake related to inability to eat as evidenced by NPO status.  Ongoing, being addressed via TF  GOAL:   Patient will meet greater than or equal to 90% of their needs  Met via TF  MONITOR:   Labs, Weight trends, TF tolerance, Skin  REASON FOR ASSESSMENT:   Consult Enteral/tube feeding initiation and management  ASSESSMENT:   82 year old female with PMHx of HTN, osteopenia, RLS, sacroiliitis admitted with large left hemispheric parietal/temporal IPH with vasogenic edema/mass effect, also with acute respiratory failure and concern for aspiration PNA given emesis pre-intubation.  01/12 - s/p trach/PEG  Pt with continuous tube feeds infusing via PEG as ordered. Pt remains on trach collar with no plans to decannulate due to mental status and inability to protect her airway. Bowel movements improved to type 5 stool type with addition of banatrol TF. Will continue.  Pt stable on current tube feeding regimen. Do not  anticipate need for acute changes to tube feeding regimen. Pt is stable for discharge and is on Difficult to Place list for discharge planning. No additional nutrition interventions warranted at this time. RD will sign-off. Please re-consult as needed.   Current TF: Jevity 1.2 @ 55 ml/hr, PROSource TF20 60 ml daily  Admit weight: 57.8 kg Current weight: 60.2 kg  Medications reviewed and include: pepcid, Juven BID, banatrol TF 60 ml BID  Labs reviewed: BUN 25 CBG's: 112  UOP: 600 ml x 24 hours  Diet Order:   Diet Order             Diet NPO time specified  Diet effective midnight                   EDUCATION NEEDS:   Not appropriate for education at this time  Skin:  Skin Assessment: Skin Integrity Issues: DTI: R ankle Stage I: R ear  Last BM:  01/29/23 type 5  Height:   Ht Readings from Last 1 Encounters:  11/29/22 5' 1"$  (1.549 m)    Weight:   Wt Readings from Last 1 Encounters:  01/29/23 60.2 kg    Ideal Body Weight:  47.7 kg  BMI:  Body mass index is 25.08 kg/m.  Estimated Nutritional Needs:   Kcal:  1500-1700  Protein:  80-100 grams  Fluid:  1.5-1.7 L/day    Gustavus Bryant, MS, RD, LDN Inpatient Clinical Dietitian Please see AMiON for contact information.

## 2023-01-30 DIAGNOSIS — I619 Nontraumatic intracerebral hemorrhage, unspecified: Secondary | ICD-10-CM | POA: Diagnosis not present

## 2023-01-30 NOTE — NC FL2 (Signed)
[place] LEVEL OF CARE FORM     IDENTIFICATION  Patient Name: Pamela Johnston Birthdate: 10/13/1941 Sex: female Admission Date (Current Location): 11/29/2022  [facility] and [place] Number:  Herbalist and Address:  [facility] [place], [place] [address] [place], [place] [address]      Provider Number: [id number]  Attending Physician Name and Address:  Geradine Girt, DO  Relative Name and Phone Number:       Current Level of Care: Hospital Recommended Level of Care: [place] Prior Approval Number:    Date Approved/Denied:   PASRR Number: [id number]  Discharge Plan: SNF    Current Diagnoses: Patient Active Problem List   Diagnosis Date Noted   Tracheostomy status ([place]) 01/07/2023   Pressure injury of skin 01/06/2023   Pneumonia due to Enterobacter aerogenes ([place]) 12/12/2022   Goals of care, counseling/discussion 12/12/2022   Brain compression ([place]) 12/11/2022   Acute respiratory failure with hypoxia ([place]) 12/11/2022   Aspiration into airway 11/30/2022   Hypoxia 11/30/2022   Intraparenchymal hemorrhage of brain ([place]) 11/30/2022   ICH (intracerebral hemorrhage) ([place]) 11/29/2022   RLS (restless legs syndrome) 11/29/2022   Osteopenia 11/29/2022   Sacroiliitis ([place]) 11/29/2022   Benign essential HTN 11/29/2022    Orientation RESPIRATION BLADDER Height & Weight      (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, persistent vegetative state)  Tracheostomy, O2 Incontinent, Indwelling catheter Weight: 137 lb 2 oz (62.2 kg) Height:  5' 1"$  (154.9 cm)  BEHAVIORAL SYMPTOMS/MOOD NEUROLOGICAL BOWEL NUTRITION STATUS      Incontinent Feeding tube  AMBULATORY STATUS COMMUNICATION OF NEEDS Skin   Total Care Does not communicate PU Stage and Appropriate Care                       Personal Care Assistance Level of Assistance  Bathing, Feeding, Dressing, Total care Bathing Assistance: Maximum assistance Feeding assistance: Maximum  assistance Dressing Assistance: Maximum assistance Total Care Assistance: Maximum assistance   Functional Limitations Info  Sight, Hearing, Speech Sight Info: Impaired Hearing Info: Impaired Speech Info: Impaired (Patient is trach dependent)    SPECIAL CARE FACTORS FREQUENCY                       Contractures Contractures Info: Not present    Additional Factors Info  Code Status, Allergies Code Status Info: Full Code Allergies Info: Nickel, Penicillin           Current Medications (01/30/2023):  This is the current hospital active medication list Current Facility-Administered Medications  Medication Dose Route Frequency Provider Last Rate Last Admin   0.9 %  sodium chloride infusion  250 mL Intravenous Continuous Omar Person, NP   Stopped at 01/01/23 0424   acetaminophen (TYLENOL) tablet 650 mg  650 mg Oral Q4H PRN Greta Doom, MD   650 mg at 01/22/23 1519   Or   acetaminophen (TYLENOL) 160 MG/5ML solution 650 mg  650 mg Per Tube Q4H PRN Greta Doom, MD   650 mg at 01/28/23 1332   Or   acetaminophen (TYLENOL) suppository 650 mg  650 mg Rectal Q4H PRN Greta Doom, MD       bisacodyl (DULCOLAX) suppository 10 mg  10 mg Rectal Daily PRN Vann, Jessica U, DO       Chlorhexidine Gluconate Cloth 2 % PADS 6 each  6 each Topical Daily Greta Doom, MD   6 each  at 01/29/23 2126   docusate (COLACE) 50 MG/5ML liquid 100 mg  100 mg Per Tube BID PRN Olalere, Adewale A, MD       famotidine (PEPCID) 40 MG/5ML suspension 20 mg  20 mg Per Tube QHS Simonne Maffucci B, MD   20 mg at 01/29/23 2128   feeding supplement (JEVITY 1.2 CAL) liquid 1,000 mL  1,000 mL Per Tube Continuous Hunsucker, Bonna Gains, MD 55 mL/hr at 01/30/23 0708 1,000 mL at 01/30/23 0708   feeding supplement (PROSource TF20) liquid 60 mL  60 mL Per Tube Daily Olalere, Adewale A, MD   60 mL at 01/30/23 0925   fiber supplement (BANATROL TF) liquid 60 mL  60 mL Per Tube BID  Eulogio Bear U, DO   60 mL at 01/30/23 [id number]   Gerhardt's butt cream   Topical QID Eulogio Bear U, DO   Given at 01/30/23 0925   latanoprost (XALATAN) 0.005 % ophthalmic solution 1 drop  1 drop Both Eyes QHS Rosalin Hawking, MD   1 drop at 01/29/23 2127   levETIRAcetam (KEPPRA) tablet 500 mg  500 mg Per Tube BID Eulogio Bear U, DO   500 mg at 01/30/23 [id number]   loperamide HCl (IMODIUM) 1 MG/7.5ML suspension 2 mg  2 mg Per Tube PRN Eulogio Bear U, DO   2 mg at 01/21/23 0940   metoprolol tartrate (LOPRESSOR) tablet 12.5 mg  12.5 mg Per Tube BID Eulogio Bear U, DO   12.5 mg at 01/30/23 [id number]   morphine (PF) 2 MG/ML injection 1 mg  1 mg Intravenous Q3H PRN Eulogio Bear U, DO   1 mg at 01/23/23 0630   nutrition supplement (JUVEN) (JUVEN) powder packet 1 packet  1 packet Per Tube BID BM Hunsucker, Bonna Gains, MD   1 packet at 01/30/23 [id number]   Oral care mouth rinse  15 mL Mouth Rinse Q2H Greta Doom, MD   15 mL at 01/30/23 1152   Oral care mouth rinse  15 mL Mouth Rinse PRN Greta Doom, MD       phenylephrine-shark liver oil-mineral oil-petrolatum (PREPARATION H) rectal ointment 1 Application  1 Application Rectal BID Eulogio Bear U, DO   1 Application at [id number]   polyethylene glycol (MIRALAX / GLYCOLAX) packet 17 g  17 g Per Tube Daily PRN Olalere, Adewale A, MD       rosuvastatin (CRESTOR) tablet 20 mg  20 mg Per Tube Daily Rosalin Hawking, MD   20 mg at 01/30/23 [id number]     Discharge Medications: Please see discharge summary for a list of discharge medications.  Relevant Imaging Results:  Relevant Lab Results:   Additional Information SSN: [id number]  Archie Endo, LCSW

## 2023-01-30 NOTE — Progress Notes (Signed)
PROGRESS NOTE  Pamela Johnston [id number] DOB: [birth year] DOA: 11/29/2022 PCP: [name], MD   LOS: 21 days   Brief Narrative / Interim history: 82 year old female with history of hypertension who comes into the hospital and is admitted on 11/29/2022 due to decreased responsiveness, aphasia, right-sided weakness.  CT head on admission showed large IPH of left parietal/left temporal regions with extensions into basal ganglia, secondary dissection with IVH and surrounding vasogenic edema with regional mass effect.  She also had several episodes of vomiting with concern for aspiration.  Neurosurgery, neurology were consulted, she was intubated and admitted to neuro ICU.  She was started on 3% hypertonic saline and palliative care was consulted due to poor prognosis.  She eventually underwent tracheostomy as well as PEG placement, and was also started on Keppra due to concern for seizures.  She is currently on trach collar.  Multiple palliative care discussions have been ongoing with the family, at 1 point hospital leadership was involved, she remains full code/full scope.  Earliest she could be placed is a month out from her tracheostomy placement, 2/12.  Now on the difficult to place list   Significant events: 12/21 - Presented to [facility] ED via EMS as Code Stroke. LKW 2000. Abrupt onset of decreased responsiveness, aphasia and R hemiplegia. Stroke admitting, NSGY consulted. PCCM consult for vent management. Treated with unasyn for aspiration pneumonia. CT Head with large L hemispheric parietal/temporal lobe IPH with extension into basal ganglia and IVH. HTS 3% started.   12/24 - Palliative care consulted, family requested meeting to be delayed 12/25 - Family requested goals of care meeting to be delayed 12/26 - Stopped Unasyn, replete phos; [name] GOC conversation: family says full code 12/27 - Restarted Unasyn for fever, RLL infiltrate; resp culture: enterobacter, MDR. Palliative consulted  12/28  - CT Head unchanged 12/29 - Unasyn changed to bactrim 12/31 - Posturing, CT head with increased shift, increased bleeding 1/2 - Family fired CVA service after discussion RE poor prognosis  1/4 - Off sedation  1/5 - No acute issues overnight. Remains on vent with increased respirations off sedation. Fever curve slightly decreased  1/6 - Pall care meeting moved to 2pm today. [name] the son-in-law will be present. On ven 30%/ On TF. Neo not started last night. MAP > 65 after fluids bolus. Febrile +.  Last CT head 12/09/22.  1/8 - No significant neurologic/clinical change. Brief GOC discussion with husband, [name], at bedside in AM. Concerns from staff re: patient GOC/decisionmaking, want to ensure husband is included in decisions/information sharing. Attempted bedside meeting but family prematurely ended meeting due to escalated discussion. 1/9 - Febrile overnight to 101.60F, remains on bromocriptine. No significant neurologic exam changes. 1/10 Family meeting yesterday with [name], plan for trach/peg though husband says they are still discussing this morning. Per Dt. Hulen Skains " So moving forward, Pamela Johnston is the designated medical decision maker for Pamela Johnston " 1/11 no new issues, plan for PEG tomorrow 1/12 for trach/peg today 1/15 No change in Neuro status , trach and PEG, No weaning , will repeat CT head without, get Case management involved in looking for LTAC ( Day 3 post trach on 1/12)   1/15-CT with some stabilization 1/19 No acute events overnight, respiratory culture 1/15 with abundant Pseudomonas, abundant Staphylococcus aureus, and moderate Enterobacter remains on Meropenem  1/22 trach collar trial 12 + hrs 1/24 trach collar x 24 hours on 35% 1/25- concern for seizures-- keppra started after EEG done 2/12- concern for seizures--  EEG reordered, negative 2/19 -PCCM saw for trach maintenance  Subjective / 24h Interval events: No overnight events  Assesement and Plan: Principal  Problem:   ICH (intracerebral hemorrhage) (HCC) Active Problems:   RLS (restless legs syndrome)   Osteopenia   Sacroiliitis (HCC)   Benign essential HTN   Aspiration into airway   Hypoxia   Intraparenchymal hemorrhage of brain (HCC)   Brain compression (HCC)   Acute respiratory failure with hypoxia (HCC)   Pneumonia due to Enterobacter aerogenes (HCC)   Goals of care, counseling/discussion   Pressure injury of skin   Tracheostomy status ([place])   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/mass effect Brain compression/ICH Right parietal lobe hemorrhage with associated edema  Severe encephalopathy Intraventricular hemorrhage and mass effect, vasogenic edema, subfalcine herniation -Per prior team members, she has a very poor prognosis.  Multiple family meetings concluded over time with CMO on board, medical decision maker is Pamela Johnston patient's son-in-law. Stroke team signed off. Maintain neuro protective measures. Nutrition and bowel regimen. Aspiration precautions   Fevers - Continue to closely monitor, chest x-ray showed minimal atelectasis but no clear-cut infiltrates.  Prior respiratory cultures showing Pseudomonas, most recent on 01/03/2023 showing Carbapenem resistance.  She has been treated for pneumonia in the past, monitor off antibiotics, but if needed could use Zerbaxa per ID given resistance.  D-dimer was elevated, lower extremity ultrasound was negative for DVT.  PE is in the differential however she is not having worsening respiratory status, may not be stable to transfer down for CT angiogram, and also she could not be anticoagulated due to her devastating hemorrhagic stroke  Concern for seizures - On Keppra, underwent a repeat EEG on 2/12 and repeat neurology consulted, due to left sided rhythmic movements.  EEG did not show any seizures.  Neurology signed off   Acute respiratory failure with hypoxia due to large intracerebral  hemorrhage - S/p trach/PEG 1/12. Previously completed 10 days of antibiotic coverage for anterobasilar pneumonia, repeat sputum culture obtained 1/15 positive for abundant Pseudomonas, abundant Staphylococcus aureus, and moderate Enterobacter, s/p course of Meropenem -Continue ATC as tolerated, hope to avoid vent needs -PRN lasix -Wean O2 down as tolerated -Bronchial hygiene -Appreciate PCCM follow-up, last seen 2/19, she is being seen weekly   Urinary retention -foley placed 1/23, Will need urologic follow up as outpatient   History of chronic sacroiliitis Osteopenia RLS -Supportive care  Scheduled Meds:  Chlorhexidine Gluconate Cloth  6 each Topical Daily   famotidine  20 mg Per Tube QHS   feeding supplement (PROSource TF20)  60 mL Per Tube Daily   fiber supplement (BANATROL TF)  60 mL Per Tube BID   Gerhardt's butt cream   Topical QID   latanoprost  1 drop Both Eyes QHS   levETIRAcetam  500 mg Per Tube BID   metoprolol tartrate  12.5 mg Per Tube BID   nutrition supplement (JUVEN)  1 packet Per Tube BID BM   mouth rinse  15 mL Mouth Rinse Q2H   phenylephrine-shark liver oil-mineral oil-petrolatum  1 Application Rectal BID   rosuvastatin  20 mg Per Tube Daily   Continuous Infusions:  sodium chloride Stopped (01/01/23 0424)   feeding supplement (JEVITY 1.2 CAL) 1,000 mL (01/30/23 0708)   PRN Meds:.acetaminophen **OR** acetaminophen (TYLENOL) oral liquid 160 mg/5 mL **OR** acetaminophen, bisacodyl, docusate, loperamide HCl, morphine injection, mouth rinse, polyethylene glycol  Current Outpatient Medications  Medication Instructions   latanoprost (XALATAN) 0.005 % ophthalmic solution 1 drop,  Both Eyes, Daily at bedtime   oxyCODONE-acetaminophen (PERCOCET/ROXICET) 5-325 MG tablet 1 tablet, Oral, Every 4 hours PRN   pramipexole (MIRAPEX) 0.25 mg, Oral, Nightly    Diet Orders (From admission, onward)     Start     Ordered   12/19/22 0001  Diet NPO time specified  Diet effective  midnight        12/18/22 1632            DVT prophylaxis: SCD's Start: 11/29/22 2215   Lab Results  Component Value Date   PLT 364 01/28/2023      Code Status: Full Code  Family Communication: No family at bedside  Status is: Inpatient  Remains inpatient appropriate because: needs placement  Level of care: Progressive  Consultants:  Neurology Neurosurgery PCCM Palliative care  Objective: Vitals:   01/30/23 0300 01/30/23 0500 01/30/23 0733 01/30/23 0830  BP: 115/65  120/63   Pulse: (!) 106  (!) 111   Resp: (!) 26  (!) 26 (!) 30  Temp: 97.6 F (36.4 C)  99.3 F (37.4 C)   TempSrc: Oral  Axillary   SpO2: 94%  96% 96%  Weight:  62.2 kg    Height:        Intake/Output Summary (Last 24 hours) at 01/30/2023 1030 Last data filed at 01/30/2023 0538 Gross per 24 hour  Intake --  Output 1450 ml  Net -1450 ml   Wt Readings from Last 3 Encounters:  01/30/23 62.2 kg    Examination: In bed, unresponsive, does not follow commands  Data Reviewed: I have independently reviewed following labs and imaging studies   CBC Recent Labs  Lab 01/25/23 0326 01/28/23 0606  WBC 7.7 9.4  HGB 10.7* 11.9*  HCT 34.3* 38.2  PLT 316 364  MCV 98.3 98.5  MCH 30.7 30.7  MCHC 31.2 31.2  RDW 15.7* 15.6*    Recent Labs  Lab 01/25/23 0326 01/28/23 0606  NA 138 142  K 3.9 4.3  CL 102 104  CO2 28 26  GLUCOSE 126* 121*  BUN 33* 25*  CREATININE 0.41* 0.58  CALCIUM 8.3* 8.8*  AST 41  --   ALT 26  --   ALKPHOS 188*  --   BILITOT 0.1*  --   ALBUMIN 2.2*  --   MG 2.3  --     ------------------------------------------------------------------------------------------------------------------ No results for input(s): "CHOL", "HDL", "LDLCALC", "TRIG", "CHOLHDL", "LDLDIRECT" in the last 72 hours.  Lab Results  Component Value Date   HGBA1C 5.5 12/01/2022    ------------------------------------------------------------------------------------------------------------------ No results for input(s): "TSH", "T4TOTAL", "T3FREE", "THYROIDAB" in the last 72 hours.  Invalid input(s): "FREET3"  Cardiac Enzymes No results for input(s): "CKMB", "TROPONINI", "MYOGLOBIN" in the last 168 hours.  Invalid input(s): "CK" ------------------------------------------------------------------------------------------------------------------ No results found for: "BNP"  CBG: Recent Labs  Lab 01/28/23 1150 01/28/23 1738 01/29/23 0749 01/29/23 1157  GLUCAP 128* 115* 112* 151*    No results found for this or any previous visit (from the past 240 hour(s)).    Radiology Studies: No results found.   Eulogio Bear DO [facility]  Between 7 am - 7 pm I am available, please contact me via Amion (for emergencies) or Securechat (non urgent messages)  Between 7 pm - 7 am I am not available, please contact night coverage MD/APP via Amion

## 2023-01-30 NOTE — Progress Notes (Addendum)
CSW received forwarded e-mail from patient's son in law [name] requesting updates on discharge plan. CSW responded and informed Mr. Albina Billet of updates.  Patient's clinical information was not previously sent out due to her trach not being 70 days old yet. CSW sent clinical information out to review to facilities for review.  CSW spoke with admissions at [facility], [place], [place] and [place] who state the facility cannot accommodate trach's at this time.   CSW spoke with Jackelyn Poling at [facility] who states she will have DON review referral.  Madilyn Fireman, MSW, LCSW Transitions of Care  Clinical Social Worker II [phone]

## 2023-01-31 DIAGNOSIS — I619 Nontraumatic intracerebral hemorrhage, unspecified: Secondary | ICD-10-CM | POA: Diagnosis not present

## 2023-01-31 DIAGNOSIS — R0902 Hypoxemia: Secondary | ICD-10-CM | POA: Diagnosis not present

## 2023-01-31 LAB — URINALYSIS, MICROSCOPIC (REFLEX): RBC / HPF: >50 RBC/hpf (ref 0–5)

## 2023-01-31 LAB — URINALYSIS, ROUTINE W REFLEX MICROSCOPIC
Bilirubin Urine: NEGATIVE
Glucose, UA: NEGATIVE mg/dL
Ketones, ur: NEGATIVE mg/dL
Nitrite: POSITIVE — AB
Protein, ur: NEGATIVE mg/dL
Specific Gravity, Urine: 1.02 (ref 1.005–1.030)
pH: 6.5 (ref 5.0–8.0)

## 2023-01-31 NOTE — Progress Notes (Signed)
CSW attempted to reach patient's daughter Ebony Hail without success - a voicemail was left requesting a return call.  Madilyn Fireman, MSW, LCSW Transitions of Care  Clinical Social Worker II [phone]

## 2023-01-31 NOTE — Progress Notes (Signed)
PROGRESS NOTE  [name] [id number] DOB: 08/27/1941 DOA: 11/29/2022 PCP: [name], MD   LOS: 20 days   Brief Narrative / Interim history: 82 year old female with history of hypertension who comes into the hospital and is admitted on 11/29/2022 due to decreased responsiveness, aphasia, right-sided weakness.  CT head on admission showed large IPH of left parietal/left temporal regions with extensions into basal ganglia, secondary dissection with IVH and surrounding vasogenic edema with regional mass effect.  She also had several episodes of vomiting with concern for aspiration.  Neurosurgery, neurology were consulted, she was intubated and admitted to neuro ICU.  She was started on 3% hypertonic saline and palliative care was consulted due to poor prognosis.  She eventually underwent tracheostomy as well as PEG placement, and was also started on Keppra due to concern for seizures.  She is currently on trach collar.  Multiple palliative care discussions have been ongoing with the family, at 1 point hospital leadership was involved, she remains full code/full scope.  Earliest she could be placed is a month out from her tracheostomy placement, 2/12.  Now on the difficult to place list   Significant events: 12/21 - Presented to [facility] ED via EMS as Code Stroke. LKW 2000. Abrupt onset of decreased responsiveness, aphasia and R hemiplegia. Stroke admitting, NSGY consulted. PCCM consult for vent management. Treated with unasyn for aspiration pneumonia. CT Head with large L hemispheric parietal/temporal lobe IPH with extension into basal ganglia and IVH. HTS 3% started.   12/24 - Palliative care consulted, family requested meeting to be delayed 12/25 - Family requested goals of care meeting to be delayed 12/26 - Stopped Unasyn, replete phos; [name] GOC conversation: family says full code 12/27 - Restarted Unasyn for fever, RLL infiltrate; resp culture: enterobacter, MDR. Palliative consulted  12/28  - CT Head unchanged 12/29 - Unasyn changed to bactrim 12/31 - Posturing, CT head with increased shift, increased bleeding 1/2 - Family fired CVA service after discussion RE poor prognosis  1/4 - Off sedation  1/5 - No acute issues overnight. Remains on vent with increased respirations off sedation. Fever curve slightly decreased  1/6 - Pall care meeting moved to 2pm today. [name] the son-in-law will be present. On ven 30%/ On TF. Neo not started last night. MAP > 65 after fluids bolus. Febrile +.  Last CT head 12/09/22.  1/8 - No significant neurologic/clinical change. Brief GOC discussion with husband, [name], at bedside in AM. Concerns from staff re: patient GOC/decisionmaking, want to ensure husband is included in decisions/information sharing. Attempted bedside meeting but family prematurely ended meeting due to escalated discussion. 1/9 - Febrile overnight to 101.30F, remains on bromocriptine. No significant neurologic exam changes. 1/10 Family meeting yesterday with [name], plan for trach/peg though husband says they are still discussing this morning. Per Dt. Hulen Skains " So moving forward, Pamela Johnston is the designated medical decision maker for Pamela Johnston " 1/11 no new issues, plan for PEG tomorrow 1/12 for trach/peg today 1/15 No change in Neuro status , trach and PEG, No weaning , will repeat CT head without, get Case management involved in looking for LTAC ( Day 3 post trach on 1/12)   1/15-CT with some stabilization 1/19 No acute events overnight, respiratory culture 1/15 with abundant Pseudomonas, abundant Staphylococcus aureus, and moderate Enterobacter remains on Meropenem  1/22 trach collar trial 12 + hrs 1/24 trach collar x 24 hours on 35% 1/25- concern for seizures-- keppra started after EEG done 2/12- concern for seizures--  EEG reordered, negative 2/19 -PCCM saw for trach maintenance  Subjective / 24h Interval events: Low grade fevers-- continue to monitor off  abx  Assesement and Plan: Principal Problem:   ICH (intracerebral hemorrhage) (HCC) Active Problems:   RLS (restless legs syndrome)   Osteopenia   Sacroiliitis (HCC)   Benign essential HTN   Aspiration into airway   Hypoxia   Intraparenchymal hemorrhage of brain (HCC)   Brain compression (HCC)   Acute respiratory failure with hypoxia (HCC)   Pneumonia due to Enterobacter aerogenes (HCC)   Goals of care, counseling/discussion   Pressure injury of skin   Tracheostomy status ([place])   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/mass effect Brain compression/ICH Right parietal lobe hemorrhage with associated edema  Severe encephalopathy Intraventricular hemorrhage and mass effect, vasogenic edema, subfalcine herniation -Per prior team members, she has a very poor prognosis.  Multiple family meetings concluded over time with CMO on board, medical decision maker is [name] patient's son-in-law. Stroke team signed off. Maintain neuro protective measures. Nutrition and bowel regimen. Aspiration precautions   Fevers - Continue to closely monitor, chest x-ray showed minimal atelectasis but no clear-cut infiltrates.  Prior respiratory cultures showing Pseudomonas, most recent on 01/03/2023 showing Carbapenem resistance.  She has been treated for pneumonia in the past, monitor off antibiotics, but if needed could use Zerbaxa per ID given resistance.  D-dimer was elevated, lower extremity ultrasound was negative for DVT.  PE is in the differential however she is not having worsening respiratory status, may not be stable to transfer down for CT angiogram, and also she could not be anticoagulated due to her devastating hemorrhagic stroke -will need urine culture if temp > 100.5  Concern for seizures - On Keppra, underwent a repeat EEG on 2/12 and repeat neurology consulted, due to left sided rhythmic movements.  EEG did not show any seizures.  Neurology  signed off   Acute respiratory failure with hypoxia due to large intracerebral hemorrhage - S/p trach/PEG 1/12. Previously completed 10 days of antibiotic coverage for anterobasilar pneumonia, repeat sputum culture obtained 1/15 positive for abundant Pseudomonas, abundant Staphylococcus aureus, and moderate Enterobacter, s/p course of Meropenem -Continue ATC as tolerated, hope to avoid vent needs -PRN lasix -Wean O2 down as tolerated -Bronchial hygiene -Appreciate PCCM follow-up, last seen 2/19, she is being seen weekly   Urinary retention -foley placed 1/23, Will need urologic follow up as outpatient   History of chronic sacroiliitis Osteopenia RLS -Supportive care  Scheduled Meds:  Chlorhexidine Gluconate Cloth  6 each Topical Daily   famotidine  20 mg Per Tube QHS   feeding supplement (PROSource TF20)  60 mL Per Tube Daily   fiber supplement (BANATROL TF)  60 mL Per Tube BID   Gerhardt's butt cream   Topical QID   latanoprost  1 drop Both Eyes QHS   levETIRAcetam  500 mg Per Tube BID   metoprolol tartrate  12.5 mg Per Tube BID   nutrition supplement (JUVEN)  1 packet Per Tube BID BM   mouth rinse  15 mL Mouth Rinse Q2H   phenylephrine-shark liver oil-mineral oil-petrolatum  1 Application Rectal BID   rosuvastatin  20 mg Per Tube Daily   Continuous Infusions:  sodium chloride Stopped (01/01/23 0424)   feeding supplement (JEVITY 1.2 CAL) 1,000 mL (01/31/23 0545)   PRN Meds:.acetaminophen **OR** acetaminophen (TYLENOL) oral liquid 160 mg/5 mL **OR** acetaminophen, bisacodyl, docusate, loperamide HCl, morphine injection, mouth rinse, polyethylene glycol  Current Outpatient Medications  Medication Instructions   latanoprost (XALATAN) 0.005 % ophthalmic solution 1 drop, Both Eyes, Daily at bedtime   oxyCODONE-acetaminophen (PERCOCET/ROXICET) 5-325 MG tablet 1 tablet, Oral, Every 4 hours PRN   pramipexole (MIRAPEX) 0.25 mg, Oral, Nightly    Diet Orders (From admission, onward)      Start     Ordered   12/19/22 0001  Diet NPO time specified  Diet effective midnight        12/18/22 1632            DVT prophylaxis: SCD's Start: 11/29/22 2215   Lab Results  Component Value Date   PLT 364 01/28/2023      Code Status: Full Code  Family Communication: No family at bedside  Status is: Inpatient  Remains inpatient appropriate because: needs placement  Level of care: Progressive  Consultants:  Neurology Neurosurgery PCCM Palliative care  Objective: Vitals:   01/31/23 0738 01/31/23 0809 01/31/23 0826 01/31/23 0830  BP: (!) 160/72   (!) 153/71  Pulse: (!) 127  (!) 125 (!) 124  Resp: (!) 33  (!) 32   Temp: 99.7 F (37.6 C)     TempSrc: Axillary     SpO2: 96%  97%   Weight:  65.3 kg    Height:        Intake/Output Summary (Last 24 hours) at 01/31/2023 1023 Last data filed at 01/31/2023 0547 Gross per 24 hour  Intake --  Output 1100 ml  Net -1100 ml   Wt Readings from Last 3 Encounters:  01/31/23 65.3 kg    Examination: In bed, unresponsive, does not follow commands  Data Reviewed: I have independently reviewed following labs and imaging studies   CBC Recent Labs  Lab 01/25/23 0326 01/28/23 0606  WBC 7.7 9.4  HGB 10.7* 11.9*  HCT 34.3* 38.2  PLT 316 364  MCV 98.3 98.5  MCH 30.7 30.7  MCHC 31.2 31.2  RDW 15.7* 15.6*    Recent Labs  Lab 01/25/23 0326 01/28/23 0606  NA 138 142  K 3.9 4.3  CL 102 104  CO2 28 26  GLUCOSE 126* 121*  BUN 33* 25*  CREATININE 0.41* 0.58  CALCIUM 8.3* 8.8*  AST 41  --   ALT 26  --   ALKPHOS 188*  --   BILITOT 0.1*  --   ALBUMIN 2.2*  --   MG 2.3  --     ------------------------------------------------------------------------------------------------------------------ No results for input(s): "CHOL", "HDL", "LDLCALC", "TRIG", "CHOLHDL", "LDLDIRECT" in the last 72 hours.  Lab Results  Component Value Date   HGBA1C 5.5 12/01/2022    ------------------------------------------------------------------------------------------------------------------ No results for input(s): "TSH", "T4TOTAL", "T3FREE", "THYROIDAB" in the last 72 hours.  Invalid input(s): "FREET3"  Cardiac Enzymes No results for input(s): "CKMB", "TROPONINI", "MYOGLOBIN" in the last 168 hours.  Invalid input(s): "CK" ------------------------------------------------------------------------------------------------------------------ No results found for: "BNP"  CBG: Recent Labs  Lab 01/28/23 1150 01/28/23 1738 01/29/23 0749 01/29/23 1157  GLUCAP 128* 115* 112* 151*    No results found for this or any previous visit (from the past 240 hour(s)).    Radiology Studies: No results found.   Eulogio Bear DO [facility]  Between 7 am - 7 pm I am available, please contact me via Amion (for emergencies) or Securechat (non urgent messages)  Between 7 pm - 7 am I am not available, please contact night coverage MD/APP via Amion

## 2023-01-31 NOTE — Progress Notes (Signed)
Pt had a low grade tem of 100.4 and Dr. Eliseo Squires was notified, new order was received for urine culture and foley change. Pt's old foley removed and new foley replaced/inserted using sterile technique with NT Felicia assistance per protocol and order. Urine culture obtained and sent per order. Pt had a yellow urine that was cloudy with some sediments returned in the foley. Will continue to closely monitor pt. Reported off to oncoming RN. Delia Heady RN

## 2023-02-01 DIAGNOSIS — R0902 Hypoxemia: Secondary | ICD-10-CM | POA: Diagnosis not present

## 2023-02-01 NOTE — Progress Notes (Signed)
PROGRESS NOTE  Pamela Johnston [id number] DOB: 1940/12/23 DOA: 11/29/2022 PCP: [name], MD   LOS: 92 days   Brief Narrative / Interim history: 82 year old female with history of hypertension who comes into the hospital and is admitted on 11/29/2022 due to decreased responsiveness, aphasia, right-sided weakness.  CT head on admission showed large IPH of left parietal/left temporal regions with extensions into basal ganglia, secondary dissection with IVH and surrounding vasogenic edema with regional mass effect.  She also had several episodes of vomiting with concern for aspiration.  Neurosurgery, neurology were consulted, she was intubated and admitted to neuro ICU.  She was started on 3% hypertonic saline and palliative care was consulted due to poor prognosis.  She eventually underwent tracheostomy as well as PEG placement, and was also started on Keppra due to concern for seizures.  She is currently on trach collar.  Multiple palliative care discussions have been ongoing with the family, at 1 point hospital leadership was involved, she remains full code/full scope.  Earliest she could be placed is a month out from her tracheostomy placement, 2/12.  Now on the difficult to place list   Significant events: 12/21 - Presented to [facility] ED via EMS as Code Stroke. LKW 2000. Abrupt onset of decreased responsiveness, aphasia and R hemiplegia. Stroke admitting, NSGY consulted. PCCM consult for vent management. Treated with unasyn for aspiration pneumonia. CT Head with large L hemispheric parietal/temporal lobe IPH with extension into basal ganglia and IVH. HTS 3% started.   12/24 - Palliative care consulted, family requested meeting to be delayed 12/25 - Family requested goals of care meeting to be delayed 12/26 - Stopped Unasyn, replete phos; [name] GOC conversation: family says full code 12/27 - Restarted Unasyn for fever, RLL infiltrate; resp culture: enterobacter, MDR. Palliative consulted  12/28  - CT Head unchanged 12/29 - Unasyn changed to bactrim 12/31 - Posturing, CT head with increased shift, increased bleeding 1/2 - Family fired CVA service after discussion RE poor prognosis  1/4 - Off sedation  1/5 - No acute issues overnight. Remains on vent with increased respirations off sedation. Fever curve slightly decreased  1/6 - Pall care meeting moved to 2pm today. [name] the son-in-law will be present. On ven 30%/ On TF. Neo not started last night. MAP > 65 after fluids bolus. Febrile +.  Last CT head 12/09/22.  1/8 - No significant neurologic/clinical change. Brief GOC discussion with husband, [name], at bedside in AM. Concerns from staff re: patient GOC/decisionmaking, want to ensure husband is included in decisions/information sharing. Attempted bedside meeting but family prematurely ended meeting due to escalated discussion. 1/9 - Febrile overnight to 101.33F, remains on bromocriptine. No significant neurologic exam changes. 1/10 Family meeting yesterday with [name], plan for trach/peg though husband says they are still discussing this morning. Per Dt. Hulen Skains " So moving forward, [name] is the designated medical decision maker for Greydis Dutt " 1/11 no new issues, plan for PEG tomorrow 1/12 for trach/peg today 1/15 No change in Neuro status , trach and PEG, No weaning , will repeat CT head without, get Case management involved in looking for LTAC ( Day 3 post trach on 1/12)   1/15-CT with some stabilization 1/19 No acute events overnight, respiratory culture 1/15 with abundant Pseudomonas, abundant Staphylococcus aureus, and moderate Enterobacter remains on Meropenem  1/22 trach collar trial 12 + hrs 1/24 trach collar x 24 hours on 35% 1/25- concern for seizures-- keppra started after EEG done 2/12- concern for seizures--  EEG reordered, negative 2/19 -PCCM saw for trach maintenance  Subjective / 24h Interval events: Low grade fevers-- continue to monitor off  abx  Assesement and Plan: Principal Problem:   ICH (intracerebral hemorrhage) (HCC) Active Problems:   RLS (restless legs syndrome)   Osteopenia   Sacroiliitis (HCC)   Benign essential HTN   Aspiration into airway   Hypoxia   Intraparenchymal hemorrhage of brain (HCC)   Brain compression (HCC)   Acute respiratory failure with hypoxia (HCC)   Pneumonia due to Enterobacter aerogenes (HCC)   Goals of care, counseling/discussion   Pressure injury of skin   Tracheostomy status ([place])   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/mass effect Brain compression/ICH Right parietal lobe hemorrhage with associated edema  Severe encephalopathy Intraventricular hemorrhage and mass effect, vasogenic edema, subfalcine herniation -Per prior team members, she has a very poor prognosis.  Multiple family meetings concluded over time with CMO on board, medical decision maker is [name] patient's son-in-law. Stroke team signed off. Maintain neuro protective measures. Nutrition and bowel regimen. Aspiration precautions   Fevers - Continue to closely monitor -  Prior respiratory cultures showing Pseudomonas, most recent on 01/03/2023 showing Carbapenem resistance.  She has been treated for pneumonia in the past, monitor off antibiotics, but if needed could use Zerbaxa per ID given resistance.  D-dimer was elevated, lower extremity ultrasound was negative for DVT.  PE is in the differential however she is not having worsening respiratory status, may not be stable to transfer down for CT angiogram, and also she could not be anticoagulated due to her devastating hemorrhagic stroke -urine culture pending -continue to monitor off abx  Concern for seizures - On Keppra, underwent a repeat EEG on 2/12 and repeat neurology consulted, due to left sided rhythmic movements.  EEG did not show any seizures.  Neurology signed off   Acute respiratory failure with hypoxia due to  large intracerebral hemorrhage - S/p trach/PEG 1/12. Previously completed 10 days of antibiotic coverage for anterobasilar pneumonia, repeat sputum culture obtained 1/15 positive for abundant Pseudomonas, abundant Staphylococcus aureus, and moderate Enterobacter, s/p course of Meropenem -Continue ATC as tolerated, hope to avoid vent needs -PRN lasix -Wean O2 down as tolerated -Bronchial hygiene -Appreciate PCCM follow-up, last seen 2/19, she is being seen weekly   Urinary retention -foley placed 1/23, Will need urologic follow up as outpatient   History of chronic sacroiliitis Osteopenia RLS -Supportive care  Scheduled Meds:  Chlorhexidine Gluconate Cloth  6 each Topical Daily   famotidine  20 mg Per Tube QHS   feeding supplement (PROSource TF20)  60 mL Per Tube Daily   fiber supplement (BANATROL TF)  60 mL Per Tube BID   Gerhardt's butt cream   Topical QID   latanoprost  1 drop Both Eyes QHS   levETIRAcetam  500 mg Per Tube BID   metoprolol tartrate  12.5 mg Per Tube BID   nutrition supplement (JUVEN)  1 packet Per Tube BID BM   mouth rinse  15 mL Mouth Rinse Q2H   phenylephrine-shark liver oil-mineral oil-petrolatum  1 Application Rectal BID   rosuvastatin  20 mg Per Tube Daily   Continuous Infusions:  sodium chloride Stopped (01/01/23 0424)   feeding supplement (JEVITY 1.2 CAL) 1,000 mL (02/01/23 0351)   PRN Meds:.acetaminophen **OR** acetaminophen (TYLENOL) oral liquid 160 mg/5 mL **OR** acetaminophen, bisacodyl, docusate, loperamide HCl, morphine injection, mouth rinse, polyethylene glycol  Current Outpatient Medications  Medication Instructions   latanoprost (XALATAN) 0.005 %  ophthalmic solution 1 drop, Both Eyes, Daily at bedtime   oxyCODONE-acetaminophen (PERCOCET/ROXICET) 5-325 MG tablet 1 tablet, Oral, Every 4 hours PRN   pramipexole (MIRAPEX) 0.25 mg, Oral, Nightly    Diet Orders (From admission, onward)     Start     Ordered   12/19/22 0001  Diet NPO time  specified  Diet effective midnight        12/18/22 1632            DVT prophylaxis: SCD's Start: 11/29/22 2215   Lab Results  Component Value Date   PLT 364 01/28/2023      Code Status: Full Code  Family Communication: No family at bedside  Status is: Inpatient  Remains inpatient appropriate because: needs placement  Level of care: Progressive  Consultants:  Neurology Neurosurgery PCCM Palliative care  Objective: Vitals:   02/01/23 0459 02/01/23 0711 02/01/23 0743 02/01/23 0833  BP:   129/69 129/69  Pulse:   (!) 114   Resp: (!) 29  (!) 26   Temp:   99.8 F (37.7 C)   TempSrc:   Axillary   SpO2: 96%  96%   Weight:  63.3 kg    Height:        Intake/Output Summary (Last 24 hours) at 02/01/2023 1114 Last data filed at 02/01/2023 0346 Gross per 24 hour  Intake 3060.75 ml  Output 1750 ml  Net 1310.75 ml   Wt Readings from Last 3 Encounters:  02/01/23 63.3 kg    Examination: In bed, unresponsive, does not follow commands  Data Reviewed: I have independently reviewed following labs and imaging studies   CBC Recent Labs  Lab 01/28/23 0606  WBC 9.4  HGB 11.9*  HCT 38.2  PLT 364  MCV 98.5  MCH 30.7  MCHC 31.2  RDW 15.6*    Recent Labs  Lab 01/28/23 0606  NA 142  K 4.3  CL 104  CO2 26  GLUCOSE 121*  BUN 25*  CREATININE 0.58  CALCIUM 8.8*    ------------------------------------------------------------------------------------------------------------------ No results for input(s): "CHOL", "HDL", "LDLCALC", "TRIG", "CHOLHDL", "LDLDIRECT" in the last 72 hours.  Lab Results  Component Value Date   HGBA1C 5.5 12/01/2022   ------------------------------------------------------------------------------------------------------------------ No results for input(s): "TSH", "T4TOTAL", "T3FREE", "THYROIDAB" in the last 72 hours.  Invalid input(s): "FREET3"  Cardiac Enzymes No results for input(s): "CKMB", "TROPONINI", "MYOGLOBIN" in the last  168 hours.  Invalid input(s): "CK" ------------------------------------------------------------------------------------------------------------------ No results found for: "BNP"  CBG: Recent Labs  Lab 01/28/23 1150 01/28/23 1738 01/29/23 0749 01/29/23 1157  GLUCAP 128* 115* 112* 151*    No results found for this or any previous visit (from the past 240 hour(s)).    Radiology Studies: No results found.   Eulogio Bear DO [facility]  Between 7 am - 7 pm I am available, please contact me via Amion (for emergencies) or Securechat (non urgent messages)  Between 7 pm - 7 am I am not available, please contact night coverage MD/APP via Amion

## 2023-02-02 DIAGNOSIS — I619 Nontraumatic intracerebral hemorrhage, unspecified: Secondary | ICD-10-CM | POA: Diagnosis not present

## 2023-02-02 LAB — CBC
HCT: 36.7 % (ref 36.0–46.0)
Hemoglobin: 11.2 g/dL — ABNORMAL LOW (ref 12.0–15.0)
MCH: 29.6 pg (ref 26.0–34.0)
MCHC: 30.5 g/dL (ref 30.0–36.0)
MCV: 96.8 fL (ref 80.0–100.0)
Platelets: 338 10*3/uL (ref 150–400)
RBC: 3.79 MIL/uL — ABNORMAL LOW (ref 3.87–5.11)
RDW: 14.9 % (ref 11.5–15.5)
WBC: 9.2 10*3/uL (ref 4.0–10.5)
nRBC: 0 % (ref 0.0–0.2)

## 2023-02-02 NOTE — Progress Notes (Signed)
PROGRESS NOTE  ANAMIKA HERBERT [id number] DOB: 03/14/1941 DOA: 11/29/2022 PCP: [name], MD   LOS: 3 days   Brief Narrative / Interim history: 82 year old female with history of hypertension who comes into the hospital and is admitted on 11/29/2022 due to decreased responsiveness, aphasia, right-sided weakness.  CT head on admission showed large IPH of left parietal/left temporal regions with extensions into basal ganglia, secondary dissection with IVH and surrounding vasogenic edema with regional mass effect.  She also had several episodes of vomiting with concern for aspiration.  Neurosurgery, neurology were consulted, she was intubated and admitted to neuro ICU.  She was started on 3% hypertonic saline and palliative care was consulted due to poor prognosis.  She eventually underwent tracheostomy as well as PEG placement, and was also started on Keppra due to concern for seizures.  She is currently on trach collar.  Multiple palliative care discussions have been ongoing with the family, at 1 point hospital leadership was involved, she remains full code/full scope.  Earliest she could be placed is a month out from her tracheostomy placement, 2/12.  Now on the difficult to place list   Significant events: 12/21 - Presented to [facility] ED via EMS as Code Stroke. LKW 2000. Abrupt onset of decreased responsiveness, aphasia and R hemiplegia. Stroke admitting, NSGY consulted. PCCM consult for vent management. Treated with unasyn for aspiration pneumonia. CT Head with large L hemispheric parietal/temporal lobe IPH with extension into basal ganglia and IVH. HTS 3% started.   12/24 - Palliative care consulted, family requested meeting to be delayed 12/25 - Family requested goals of care meeting to be delayed 12/26 - Stopped Unasyn, replete phos; [name] GOC conversation: family says full code 12/27 - Restarted Unasyn for fever, RLL infiltrate; resp culture: enterobacter, MDR. Palliative consulted  12/28  - CT Head unchanged 12/29 - Unasyn changed to bactrim 12/31 - Posturing, CT head with increased shift, increased bleeding 1/2 - Family fired CVA service after discussion RE poor prognosis  1/4 - Off sedation  1/5 - No acute issues overnight. Remains on vent with increased respirations off sedation. Fever curve slightly decreased  1/6 - Pall care meeting moved to 2pm today. [name] the son-in-law will be present. On ven 30%/ On TF. Neo not started last night. MAP > 65 after fluids bolus. Febrile +.  Last CT head 12/09/22.  1/8 - No significant neurologic/clinical change. Brief GOC discussion with husband, [name], at bedside in AM. Concerns from staff re: patient GOC/decisionmaking, want to ensure husband is included in decisions/information sharing. Attempted bedside meeting but family prematurely ended meeting due to escalated discussion. 1/9 - Febrile overnight to 101.61F, remains on bromocriptine. No significant neurologic exam changes. 1/10 Family meeting yesterday with [name], plan for trach/peg though husband says they are still discussing this morning. Per Dt. Hulen Skains " So moving forward, [name] is the designated medical decision maker for Harry Scherer " 1/11 no new issues, plan for PEG tomorrow 1/12 for trach/peg today 1/15 No change in Neuro status , trach and PEG, No weaning , will repeat CT head without, get Case management involved in looking for LTAC ( Day 3 post trach on 1/12)   1/15-CT with some stabilization 1/19 No acute events overnight, respiratory culture 1/15 with abundant Pseudomonas, abundant Staphylococcus aureus, and moderate Enterobacter remains on Meropenem  1/22 trach collar trial 12 + hrs 1/24 trach collar x 24 hours on 35% 1/25- concern for seizures-- keppra started after EEG done 2/12- concern for seizures--  EEG reordered, negative 2/19 -PCCM saw for trach maintenance  Subjective / 24h Interval events: Fevers improved off abx  Assesement and Plan: Principal  Problem:   ICH (intracerebral hemorrhage) (HCC) Active Problems:   RLS (restless legs syndrome)   Osteopenia   Sacroiliitis (HCC)   Benign essential HTN   Aspiration into airway   Hypoxia   Intraparenchymal hemorrhage of brain (HCC)   Brain compression (HCC)   Acute respiratory failure with hypoxia (HCC)   Pneumonia due to Enterobacter aerogenes (HCC)   Goals of care, counseling/discussion   Pressure injury of skin   Tracheostomy status ([place])   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/mass effect Brain compression/ICH Right parietal lobe hemorrhage with associated edema  Severe encephalopathy Intraventricular hemorrhage and mass effect, vasogenic edema, subfalcine herniation -Per prior team members, she has a very poor prognosis.  Multiple family meetings concluded over time with CMO on board, medical decision maker is [name] patient's son-in-law. Stroke team signed off. Maintain neuro protective measures. Nutrition and bowel regimen. Aspiration precautions   Fevers - Continue to closely monitor -  Prior respiratory cultures showing Pseudomonas, most recent on 01/03/2023 showing Carbapenem resistance.  She has been treated for pneumonia in the past, monitor off antibiotics, but if needed could use Zerbaxa per ID given resistance.  D-dimer was elevated, lower extremity ultrasound was negative for DVT.  PE is in the differential however she is not having worsening respiratory status, may not be stable to transfer down for CT angiogram, and also she could not be anticoagulated due to her devastating hemorrhagic stroke -urine culture pending -continue to monitor off abx  Concern for seizures - On Keppra, underwent a repeat EEG on 2/12 and repeat neurology consulted, due to left sided rhythmic movements.  EEG did not show any seizures.  Neurology signed off   Acute respiratory failure with hypoxia due to large intracerebral hemorrhage - S/p  trach/PEG 1/12. Previously completed 10 days of antibiotic coverage for anterobasilar pneumonia, repeat sputum culture obtained 1/15 positive for abundant Pseudomonas, abundant Staphylococcus aureus, and moderate Enterobacter, s/p course of Meropenem -Continue ATC as tolerated, hope to avoid vent needs -PRN lasix -Wean O2 down as tolerated -Bronchial hygiene -Appreciate PCCM follow-up, last seen 2/19, she is being seen weekly   Urinary retention -foley placed 1/23, Will need urologic follow up as outpatient   History of chronic sacroiliitis Osteopenia RLS -Supportive care  Scheduled Meds:  Chlorhexidine Gluconate Cloth  6 each Topical Daily   famotidine  20 mg Per Tube QHS   feeding supplement (PROSource TF20)  60 mL Per Tube Daily   fiber supplement (BANATROL TF)  60 mL Per Tube BID   Gerhardt's butt cream   Topical QID   latanoprost  1 drop Both Eyes QHS   levETIRAcetam  500 mg Per Tube BID   metoprolol tartrate  12.5 mg Per Tube BID   nutrition supplement (JUVEN)  1 packet Per Tube BID BM   mouth rinse  15 mL Mouth Rinse Q2H   phenylephrine-shark liver oil-mineral oil-petrolatum  1 Application Rectal BID   rosuvastatin  20 mg Per Tube Daily   Continuous Infusions:  sodium chloride Stopped (01/01/23 0424)   feeding supplement (JEVITY 1.2 CAL) 1,000 mL (02/02/23 0622)   PRN Meds:.acetaminophen **OR** acetaminophen (TYLENOL) oral liquid 160 mg/5 mL **OR** acetaminophen, bisacodyl, docusate, loperamide HCl, morphine injection, mouth rinse, polyethylene glycol  Current Outpatient Medications  Medication Instructions   latanoprost (XALATAN) 0.005 % ophthalmic solution 1  drop, Both Eyes, Daily at bedtime   oxyCODONE-acetaminophen (PERCOCET/ROXICET) 5-325 MG tablet 1 tablet, Oral, Every 4 hours PRN   pramipexole (MIRAPEX) 0.25 mg, Oral, Nightly    Diet Orders (From admission, onward)     Start     Ordered   12/19/22 0001  Diet NPO time specified  Diet effective midnight         12/18/22 1632            DVT prophylaxis: SCD's Start: 11/29/22 2215   Lab Results  Component Value Date   PLT 338 02/02/2023      Code Status: Full Code  Family Communication: No family at bedside  Status is: Inpatient  Remains inpatient appropriate because: needs placement  Level of care: Progressive  Consultants:  Neurology Neurosurgery PCCM Palliative care  Objective: Vitals:   02/02/23 0319 02/02/23 0500 02/02/23 0713 02/02/23 0750  BP: 133/70  (!) 123/57 (!) 115/54  Pulse:    (!) 116  Resp:    (!) 28  Temp: 98.6 F (37 C)  98.1 F (36.7 C) 98.8 F (37.1 C)  TempSrc: Axillary  Axillary Oral  SpO2:    96%  Weight:  63.3 kg    Height:        Intake/Output Summary (Last 24 hours) at 02/02/2023 1039 Last data filed at 02/01/2023 1844 Gross per 24 hour  Intake 1699.59 ml  Output 750 ml  Net 949.59 ml   Wt Readings from Last 3 Encounters:  02/02/23 63.3 kg    Examination: In bed, unresponsive, does not follow commands  Data Reviewed: I have independently reviewed following labs and imaging studies   CBC Recent Labs  Lab 01/28/23 0606 02/02/23 0340  WBC 9.4 9.2  HGB 11.9* 11.2*  HCT 38.2 36.7  PLT 364 338  MCV 98.5 96.8  MCH 30.7 29.6  MCHC 31.2 30.5  RDW 15.6* 14.9    Recent Labs  Lab 01/28/23 0606  NA 142  K 4.3  CL 104  CO2 26  GLUCOSE 121*  BUN 25*  CREATININE 0.58  CALCIUM 8.8*    ------------------------------------------------------------------------------------------------------------------ No results for input(s): "CHOL", "HDL", "LDLCALC", "TRIG", "CHOLHDL", "LDLDIRECT" in the last 72 hours.  Lab Results  Component Value Date   HGBA1C 5.5 12/01/2022   ------------------------------------------------------------------------------------------------------------------ No results for input(s): "TSH", "T4TOTAL", "T3FREE", "THYROIDAB" in the last 72 hours.  Invalid input(s): "FREET3"  Cardiac Enzymes No results for  input(s): "CKMB", "TROPONINI", "MYOGLOBIN" in the last 168 hours.  Invalid input(s): "CK" ------------------------------------------------------------------------------------------------------------------ No results found for: "BNP"  CBG: Recent Labs  Lab 01/28/23 1150 01/28/23 1738 01/29/23 0749 01/29/23 1157  GLUCAP 128* 115* 112* 151*    Recent Results (from the past 240 hour(s))  Remove and replace urinary cath (placed > 5 days) then obtain urine culture from new indwelling urinary catheter.     Status: Abnormal (Preliminary result)   Collection Time: 01/31/23  6:32 PM   Specimen: Urine, Catheterized  Result Value Ref Range Status   Specimen Description URINE, CATHETERIZED  Final   Special Requests NONE  Final   Culture (A)  Final    70,000 COLONIES/mL PSEUDOMONAS AERUGINOSA CULTURE REINCUBATED FOR BETTER GROWTH Performed at [facility], [place] [address], [place], [place] [address]    Report Status PENDING  Incomplete      Radiology Studies: No results found.   Eulogio Bear DO [facility]  Between 7 am - 7 pm I am available, please contact me via Amion (for emergencies) or  Securechat (non urgent messages)  Between 7 pm - 7 am I am not available, please contact night coverage MD/APP via Amion

## 2023-02-03 ENCOUNTER — Inpatient Hospital Stay (HOSPITAL_COMMUNITY): Payer: Medicare PPO

## 2023-02-03 DIAGNOSIS — I619 Nontraumatic intracerebral hemorrhage, unspecified: Secondary | ICD-10-CM | POA: Diagnosis not present

## 2023-02-03 LAB — URINE CULTURE: Culture: 70000 — AB

## 2023-02-03 MED ORDER — SODIUM CHLORIDE 0.9 % IV SOLN
3.0000 g | Freq: Three times a day (TID) | INTRAVENOUS | Status: DC
  Administered 2023-02-03 – 2023-02-06 (×10): 3 g via INTRAVENOUS
  Filled 2023-02-03 (×10): qty 8

## 2023-02-03 NOTE — Progress Notes (Signed)
PROGRESS NOTE  Pamela Johnston [id number] DOB: 05/06/1941 DOA: 11/29/2022 PCP: [name], MD   LOS: 26 days   Brief Narrative / Interim history: 82 year old female with history of hypertension who comes into the hospital and is admitted on 11/29/2022 due to decreased responsiveness, aphasia, right-sided weakness.  CT head on admission showed large IPH of left parietal/left temporal regions with extensions into basal ganglia, secondary dissection with IVH and surrounding vasogenic edema with regional mass effect.  She also had several episodes of vomiting with concern for aspiration.  Neurosurgery, neurology were consulted, she was intubated and admitted to neuro ICU.  She was started on 3% hypertonic saline and palliative care was consulted due to poor prognosis.  She eventually underwent tracheostomy as well as PEG placement, and was also started on Keppra due to concern for seizures.  She is currently on trach collar.  Multiple palliative care discussions have been ongoing with the family, at 1 point hospital leadership was involved, she remains full code/full scope.  Earliest she could be placed is a month out from her tracheostomy placement, 2/12.  Now on the difficult to place list   Significant events: 12/21 - Presented to [facility] ED via EMS as Code Stroke. LKW 2000. Abrupt onset of decreased responsiveness, aphasia and R hemiplegia. Stroke admitting, NSGY consulted. PCCM consult for vent management. Treated with unasyn for aspiration pneumonia. CT Head with large L hemispheric parietal/temporal lobe IPH with extension into basal ganglia and IVH. HTS 3% started.   12/24 - Palliative care consulted, family requested meeting to be delayed 12/25 - Family requested goals of care meeting to be delayed 12/26 - Stopped Unasyn, replete phos; [name] GOC conversation: family says full code 12/27 - Restarted Unasyn for fever, RLL infiltrate; resp culture: enterobacter, MDR. Palliative consulted  12/28  - CT Head unchanged 12/29 - Unasyn changed to bactrim 12/31 - Posturing, CT head with increased shift, increased bleeding 1/2 - Family fired CVA service after discussion RE poor prognosis  1/4 - Off sedation  1/5 - No acute issues overnight. Remains on vent with increased respirations off sedation. Fever curve slightly decreased  1/6 - Pall care meeting moved to 2pm today. [name] the son-in-law will be present. On ven 30%/ On TF. Neo not started last night. MAP > 65 after fluids bolus. Febrile +.  Last CT head 12/09/22.  1/8 - No significant neurologic/clinical change. Brief GOC discussion with husband, [name], at bedside in AM. Concerns from staff re: patient GOC/decisionmaking, want to ensure husband is included in decisions/information sharing. Attempted bedside meeting but family prematurely ended meeting due to escalated discussion. 1/9 - Febrile overnight to 101.31F, remains on bromocriptine. No significant neurologic exam changes. 1/10 Family meeting yesterday with [name], plan for trach/peg though husband says they are still discussing this morning. Per Dt. Hulen Skains " So moving forward, [name] is the designated medical decision maker for [name] " 1/11 no new issues, plan for PEG tomorrow 1/12 for trach/peg today 1/15 No change in Neuro status , trach and PEG, No weaning , will repeat CT head without, get Case management involved in looking for LTAC ( Day 3 post trach on 1/12)   1/15-CT with some stabilization 1/19 No acute events overnight, respiratory culture 1/15 with abundant Pseudomonas, abundant Staphylococcus aureus, and moderate Enterobacter remains on Meropenem  1/22 trach collar trial 12 + hrs 1/24 trach collar x 24 hours on 35% 1/25- concern for seizures-- keppra started after EEG done 2/12- concern for seizures--  EEG reordered, negative 2/19 -PCCM saw for trach maintenance  Subjective / 24h Interval events: Fevers improved off abx  Assesement and Plan: Principal  Problem:   ICH (intracerebral hemorrhage) (HCC) Active Problems:   RLS (restless legs syndrome)   Osteopenia   Sacroiliitis (HCC)   Benign essential HTN   Aspiration into airway   Hypoxia   Intraparenchymal hemorrhage of brain (HCC)   Brain compression (HCC)   Acute respiratory failure with hypoxia (HCC)   Pneumonia due to Enterobacter aerogenes (HCC)   Goals of care, counseling/discussion   Pressure injury of skin   Tracheostomy status ([place])   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/mass effect Brain compression/ICH Right parietal lobe hemorrhage with associated edema  Severe encephalopathy Intraventricular hemorrhage and mass effect, vasogenic edema, subfalcine herniation -Per prior team members, she has a very poor prognosis.  Multiple family meetings concluded over time with CMO on board, medical decision maker is [name] patient's son-in-law. Stroke team signed off. Maintain neuro protective measures. Nutrition and bowel regimen. Aspiration precautions   Fevers - Continue to closely monitor -  Prior respiratory cultures showing Pseudomonas, most recent on 01/03/2023 showing Carbapenem resistance.  She has been treated for pneumonia in the past, monitor off antibiotics, but if needed could use Zerbaxa per ID given resistance.  D-dimer was elevated, lower extremity ultrasound was negative for DVT.  PE is in the differential however she is not having worsening respiratory status, may not be stable to transfer down for CT angiogram, and also she could not be anticoagulated due to her devastating hemorrhagic stroke -urine culture pseudomonas-- suspect colonized -continue to monitor off abx  Concern for seizures - On Keppra, underwent a repeat EEG on 2/12 and repeat neurology consulted, due to left sided rhythmic movements.  EEG did not show any seizures.  Neurology signed off   Acute respiratory failure with hypoxia due to large  intracerebral hemorrhage - S/p trach/PEG 1/12. Previously completed 10 days of antibiotic coverage for anterobasilar pneumonia, repeat sputum culture obtained 1/15 positive for abundant Pseudomonas, abundant Staphylococcus aureus, and moderate Enterobacter, s/p course of Meropenem -Continue ATC as tolerated, hope to avoid vent needs -PRN lasix -Wean O2 down as tolerated -Bronchial hygiene -Appreciate PCCM follow-up, last seen 2/19, she is being seen weekly   Urinary retention -foley placed 1/23, Will need urologic follow up as outpatient   History of chronic sacroiliitis Osteopenia RLS -Supportive care  Scheduled Meds:  Chlorhexidine Gluconate Cloth  6 each Topical Daily   famotidine  20 mg Per Tube QHS   feeding supplement (PROSource TF20)  60 mL Per Tube Daily   fiber supplement (BANATROL TF)  60 mL Per Tube BID   Gerhardt's butt cream   Topical QID   latanoprost  1 drop Both Eyes QHS   levETIRAcetam  500 mg Per Tube BID   metoprolol tartrate  12.5 mg Per Tube BID   nutrition supplement (JUVEN)  1 packet Per Tube BID BM   mouth rinse  15 mL Mouth Rinse Q2H   phenylephrine-shark liver oil-mineral oil-petrolatum  1 Application Rectal BID   rosuvastatin  20 mg Per Tube Daily   Continuous Infusions:  sodium chloride Stopped (01/01/23 0424)   feeding supplement (JEVITY 1.2 CAL) 55 mL/hr at 02/02/23 1942   PRN Meds:.acetaminophen **OR** acetaminophen (TYLENOL) oral liquid 160 mg/5 mL **OR** acetaminophen, bisacodyl, docusate, loperamide HCl, morphine injection, mouth rinse, polyethylene glycol  Current Outpatient Medications  Medication Instructions   latanoprost (XALATAN) 0.005 %  ophthalmic solution 1 drop, Both Eyes, Daily at bedtime   oxyCODONE-acetaminophen (PERCOCET/ROXICET) 5-325 MG tablet 1 tablet, Oral, Every 4 hours PRN   pramipexole (MIRAPEX) 0.25 mg, Oral, Nightly    Diet Orders (From admission, onward)     Start     Ordered   12/19/22 0001  Diet NPO time specified   Diet effective midnight        12/18/22 1632            DVT prophylaxis: SCD's Start: 11/29/22 2215   Lab Results  Component Value Date   PLT 338 02/02/2023      Code Status: Full Code  Family Communication: No family at bedside  Status is: Inpatient  Remains inpatient appropriate because: needs placement  Level of care: Progressive  Consultants:  Neurology Neurosurgery PCCM Palliative care  Objective: Vitals:   02/03/23 0348 02/03/23 0500 02/03/23 0910 02/03/23 1213  BP:   119/66 (!) 117/54  Pulse: (!) 107  (!) 127 (!) 119  Resp: (!) 27  (!) 32 (!) 37  Temp:   99 F (37.2 C) 98.6 F (37 C)  TempSrc:   Axillary Oral  SpO2: 96%  94% 92%  Weight:  63.3 kg    Height:        Intake/Output Summary (Last 24 hours) at 02/03/2023 1251 Last data filed at 02/03/2023 0400 Gross per 24 hour  Intake 2029.66 ml  Output 1400 ml  Net 629.66 ml   Wt Readings from Last 3 Encounters:  02/03/23 63.3 kg    Examination: In bed, unresponsive, does not follow commands  Data Reviewed: I have independently reviewed following labs and imaging studies   CBC Recent Labs  Lab 01/28/23 0606 02/02/23 0340  WBC 9.4 9.2  HGB 11.9* 11.2*  HCT 38.2 36.7  PLT 364 338  MCV 98.5 96.8  MCH 30.7 29.6  MCHC 31.2 30.5  RDW 15.6* 14.9    Recent Labs  Lab 01/28/23 0606  NA 142  K 4.3  CL 104  CO2 26  GLUCOSE 121*  BUN 25*  CREATININE 0.58  CALCIUM 8.8*    ------------------------------------------------------------------------------------------------------------------ No results for input(s): "CHOL", "HDL", "LDLCALC", "TRIG", "CHOLHDL", "LDLDIRECT" in the last 72 hours.  Lab Results  Component Value Date   HGBA1C 5.5 12/01/2022   ------------------------------------------------------------------------------------------------------------------ No results for input(s): "TSH", "T4TOTAL", "T3FREE", "THYROIDAB" in the last 72 hours.  Invalid input(s):  "FREET3"  Cardiac Enzymes No results for input(s): "CKMB", "TROPONINI", "MYOGLOBIN" in the last 168 hours.  Invalid input(s): "CK" ------------------------------------------------------------------------------------------------------------------ No results found for: "BNP"  CBG: Recent Labs  Lab 01/28/23 1150 01/28/23 1738 01/29/23 0749 01/29/23 1157  GLUCAP 128* 115* 112* 151*    Recent Results (from the past 240 hour(s))  Remove and replace urinary cath (placed > 5 days) then obtain urine culture from new indwelling urinary catheter.     Status: Abnormal   Collection Time: 01/31/23  6:32 PM   Specimen: Urine, Catheterized  Result Value Ref Range Status   Specimen Description URINE, CATHETERIZED  Final   Special Requests   Final    NONE Performed at [facility], [address] [place], [place] [address]    Culture (A)  Final    70,000 COLONIES/mL PSEUDOMONAS AERUGINOSA 70,000 COLONIES/mL ENTEROBACTER AEROGENES PSEUDOMONAS AERUGINOSA Two isolates with different morphologies were identified as the same organism.The most resistant organism was reported.    Report Status 02/03/2023 FINAL  Final   Organism ID, Bacteria PSEUDOMONAS AERUGINOSA (A)  Final  Organism ID, Bacteria ENTEROBACTER AEROGENES (A)  Final      Susceptibility   Enterobacter aerogenes - MIC*    CEFEPIME <=0.12 SENSITIVE Sensitive     CEFTRIAXONE <=0.25 SENSITIVE Sensitive     CIPROFLOXACIN <=0.25 SENSITIVE Sensitive     GENTAMICIN <=1 SENSITIVE Sensitive     IMIPENEM 2 SENSITIVE Sensitive     NITROFURANTOIN 32 SENSITIVE Sensitive     TRIMETH/SULFA <=20 SENSITIVE Sensitive     PIP/TAZO <=4 SENSITIVE Sensitive     * 70,000 COLONIES/mL ENTEROBACTER AEROGENES   Pseudomonas aeruginosa - MIC*    CEFTAZIDIME 16 INTERMEDIATE Intermediate     CIPROFLOXACIN 1 INTERMEDIATE Intermediate     GENTAMICIN <=1 SENSITIVE Sensitive     IMIPENEM >=16 RESISTANT Resistant     * 70,000 COLONIES/mL PSEUDOMONAS  AERUGINOSA      Radiology Studies: No results found.   Eulogio Bear DO [facility]  Between 7 am - 7 pm I am available, please contact me via Amion (for emergencies) or Securechat (non urgent messages)  Between 7 pm - 7 am I am not available, please contact night coverage MD/APP via Amion

## 2023-02-03 NOTE — Progress Notes (Addendum)
Pharmacy Antibiotic Note  Pamela Johnston is a 82 y.o. female with pneumonia.  Pharmacy has been consulted for Unasyn dosing. -WBC= 9.2, afebrile -urine cultures with pseudomonas and enterobacter but colonization suspected  Plan: -Unasyn 3gm IV q8h -Will follow renal function, cultures and clinical progress    Height: '5\' 1"'$  (154.9 cm) Weight: 63.3 kg (139 lb 8.8 oz) IBW/kg (Calculated) : 47.8  Temp (24hrs), Avg:98.6 F (37 C), Min:98.2 F (36.8 C), Max:99 F (37.2 C)  Recent Labs  Lab 01/28/23 0606 02/02/23 0340  WBC 9.4 9.2  CREATININE 0.58  --     Estimated Creatinine Clearance: 48.6 mL/min (by C-G formula based on SCr of 0.58 mg/dL).    Allergies  Allergen Reactions   Nickel Other (See Comments)    Verified by Allergist   Penicillin G Other (See Comments)    Unknown reaction Tolerated Unasyn 11/2022.  TDD.    Antimicrobials this admission: Unasyn 12/21 >>12/26, 12/27>>12/29 Bactrim 12/29 >>1/7 Cefepime 1/17 x 1 Meropenem 1/17 x 5 days Unsyn 2/25>>  Dose adjustments this admission:   Microbiology results: 12/22 MRSA PCR - negative 12/27 TA - Enterobacter (S cefepime, cipro, gent, imipenem, Bactrim) 1/13 BCx - neg 1/15 TA - abundant pseudomonas (R-cefepime), MSSA, mod enterobacter aerogenes  1/25 TA MDR psuedomonas 2/22: urine pseudomonas, enterobacter (likely colonization)  Thank you for allowing pharmacy to be a part of this patient's care.

## 2023-02-04 DIAGNOSIS — I619 Nontraumatic intracerebral hemorrhage, unspecified: Secondary | ICD-10-CM | POA: Diagnosis not present

## 2023-02-04 DIAGNOSIS — J9601 Acute respiratory failure with hypoxia: Secondary | ICD-10-CM | POA: Diagnosis not present

## 2023-02-04 LAB — GLUCOSE, CAPILLARY: Glucose-Capillary: 108 mg/dL — ABNORMAL HIGH (ref 70–99)

## 2023-02-04 MED ORDER — GLYCOPYRROLATE 1 MG PO TABS
1.0000 mg | ORAL_TABLET | Freq: Every day | ORAL | Status: DC | PRN
  Administered 2023-02-04 – 2023-02-24 (×11): 1 mg
  Filled 2023-02-04 (×11): qty 1

## 2023-02-04 NOTE — Progress Notes (Signed)
CSW spoke with patient's daughter Ebony Hail and son in law Shanon Brow on unit to discuss discharge planning. CSW explained discharge planning efforts and reasons for lack of bed offers. Ebony Hail and Shanon Brow confirm that [facility] application has been submitted and per [place] of financial counseling it is still pending as of this morning. Ebony Hail and [place] live in [place], [place] but have been traveling back to [place] while patient has been hospitalized. Shanon Brow reports he is the patient's POA. Ebony Hail and Shanon Brow report the patient's husband [name] recently received a severe Alzheimer's diagnosis from his physician. Ebony Hail and Shanon Brow report [name] is reluctant to their involvement and requested he not return to his home. Ebony Hail and Shanon Brow report that patient will have to be placed in a facility that offers a memory care unit for [place], as the two cannot be separated. CSW educated Ebony Hail and Shanon Brow about barriers to that occurring. CSW educated Ebony Hail and Shanon Brow on what long term care placement will look like in a SNF. CSW informed Ebony Hail and Shanon Brow that there is a chance Medicaid could be denied and that the family would have to pay privately for SNF or private duty care givers in the home, but the patient cannot remain hospitalized forever. All questions answered and no further concerns at this time - CSW encouraged Ebony Hail and Shanon Brow to reach out if needs arise.  CSW will continue to follow for discharge planning.  Madilyn Fireman, MSW, LCSW Transitions of Care  Clinical Social Worker II [phone]

## 2023-02-04 NOTE — Progress Notes (Signed)
PROGRESS NOTE  Pamela Johnston [id number] DOB: 18-Sep-1941 DOA: 11/29/2022 PCP: Pamela Aly, MD   LOS: 49 days   Brief Narrative / Interim history: 82 year old female with history of hypertension who comes into the hospital and is admitted on 11/29/2022 due to decreased responsiveness, aphasia, right-sided weakness.  CT head on admission showed large IPH of left parietal/left temporal regions with extensions into basal ganglia, secondary dissection with IVH and surrounding vasogenic edema with regional mass effect.  She also had several episodes of vomiting with concern for aspiration.  Neurosurgery, neurology were consulted, she was intubated and admitted to neuro ICU.  She was started on 3% hypertonic saline and palliative care was consulted due to poor prognosis.  She eventually underwent tracheostomy as well as PEG placement, and was also started on Keppra due to concern for seizures.  She is currently on trach collar.  Multiple palliative care discussions have been ongoing with the family, at 1 point hospital leadership was involved, she remains full code/full scope.  Earliest she could be placed is a month out from her tracheostomy placement, 2/12.  Now on the difficult to place list   Significant events: 12/21 - Presented to [facility] ED via EMS as Code Stroke. LKW 2000. Abrupt onset of decreased responsiveness, aphasia and R hemiplegia. Stroke admitting, NSGY consulted. PCCM consult for vent management. Treated with unasyn for aspiration pneumonia. CT Head with large L hemispheric parietal/temporal lobe IPH with extension into basal ganglia and IVH. HTS 3% started.   12/24 - Palliative care consulted, family requested meeting to be delayed 12/25 - Family requested goals of care meeting to be delayed 12/26 - Stopped Unasyn, replete phos; Pamela Johnston GOC conversation: family says full code 12/27 - Restarted Unasyn for fever, RLL infiltrate; resp culture: enterobacter, MDR. Palliative consulted  12/28  - CT Head unchanged 12/29 - Unasyn changed to bactrim 12/31 - Posturing, CT head with increased shift, increased bleeding 1/2 - Family fired CVA service after discussion RE poor prognosis  1/4 - Off sedation  1/5 - No acute issues overnight. Remains on vent with increased respirations off sedation. Fever curve slightly decreased  1/6 - Pall care meeting moved to 2pm today. Pamela Johnston the son-in-law will be present. On ven 30%/ On TF. Neo not started last night. MAP > 65 after fluids bolus. Febrile +.  Last CT head 12/09/22.  1/8 - No significant neurologic/clinical change. Brief GOC discussion with husband, Pamela Johnston, at bedside in AM. Concerns from staff re: patient GOC/decisionmaking, want to ensure husband is included in decisions/information sharing. Attempted bedside meeting but family prematurely ended meeting due to escalated discussion. 1/9 - Febrile overnight to 101.3F, remains on bromocriptine. No significant neurologic exam changes. 1/10 Family meeting yesterday with [name], plan for trach/peg though husband says they are still discussing this morning. Per Dt. Hulen Skains " So moving forward, Pamela Johnston is the designated medical decision maker for Pamela Johnston " 1/11 no new issues, plan for PEG tomorrow 1/12 for trach/peg today 1/15 No change in Neuro status , trach and PEG, No weaning , will repeat CT head without, get Case management involved in looking for LTAC ( Day 3 post trach on 1/12)   1/15-CT with some stabilization 1/19 No acute events overnight, respiratory culture 1/15 with abundant Pseudomonas, abundant Staphylococcus aureus, and moderate Enterobacter remains on Meropenem  1/22 trach collar trial 12 + hrs 1/24 trach collar x 24 hours on 35% 1/25- concern for seizures-- keppra started after EEG done 2/12- concern for seizures--  EEG reordered, negative 2/19 -PCCM saw for trach maintenance  Subjective / 24h Interval events: Episode of excessive/thick secretions-- tube feeds held  overnight and resolved-- ? 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  Multiple family meetings concluded over time with CMO on board, medical decision maker is Pamela Johnston patient's son-in-law. Stroke team signed off. Maintain neuro protective measures. Nutrition and bowel regimen. Aspiration precautions   Fevers - Continue to closely monitor -  Prior respiratory cultures showing Pseudomonas, most recent on 01/03/2023 showing Carbapenem resistance.  She has been treated for pneumonia in the past, monitor off antibiotics, but if needed could use Zerbaxa per ID given resistance.  D-dimer was elevated, lower extremity ultrasound was negative for DVT.  PE is in the differential however she is not having worsening respiratory status, may not be stable to transfer down for CT angiogram, and also she could not be anticoagulated due to her devastating hemorrhagic stroke -urine culture pseudomonas-- suspect colonized -? Aspiration PNA-- has episode of vomiting and then developed tachypnea-- will treat with unasyn for now  Concern for seizures - On Keppra, underwent a repeat EEG on 2/12 and repeat neurology consulted,  due to left sided rhythmic movements.  EEG did not show any seizures.  Neurology signed off   Acute respiratory failure with hypoxia due to large intracerebral hemorrhage - S/p trach/PEG 1/12. Previously completed 10 days of antibiotic coverage for anterobasilar pneumonia, repeat sputum culture obtained 1/15 positive for abundant Pseudomonas, abundant Staphylococcus aureus, and moderate Enterobacter, s/p course of Meropenem -Continue ATC as tolerated, hope to avoid vent needs -PRN lasix -Wean O2 down as tolerated -Bronchial hygiene -Appreciate PCCM follow-up, last seen 2/26, she is being seen weekly   Urinary retention -foley placed 1/23, Will need urologic follow up as outpatient   History of chronic sacroiliitis Osteopenia RLS -Supportive care  Scheduled Meds:  Chlorhexidine Gluconate Cloth  6 each Topical Daily   famotidine  20 mg Per Tube QHS   feeding supplement (PROSource TF20)  60 mL Per Tube Daily   fiber supplement (BANATROL TF)  60 mL Per Tube BID   Gerhardt's butt cream   Topical QID   latanoprost  1 drop Both Eyes QHS   levETIRAcetam  500 mg Per Tube BID   metoprolol tartrate  12.5 mg Per Tube BID   nutrition supplement (JUVEN)  1 packet Per Tube BID BM   mouth rinse  15 mL Mouth Rinse Q2H   phenylephrine-shark liver oil-mineral oil-petrolatum  1 Application Rectal BID   rosuvastatin  20 mg Per Tube Daily   Continuous Infusions:  sodium chloride Stopped (01/01/23 0424)   ampicillin-sulbactam (UNASYN) IV 3 g (02/04/23 0844)   feeding supplement (JEVITY 1.2 CAL) Stopped (02/03/23 1432)   PRN Meds:.acetaminophen **OR** acetaminophen (TYLENOL) oral liquid  160 mg/5 mL **OR** acetaminophen, bisacodyl, docusate, glycopyrrolate, loperamide HCl, morphine injection, mouth rinse, polyethylene glycol  Current Outpatient Medications  Medication Instructions   latanoprost (XALATAN) 0.005 % ophthalmic solution 1 drop, Both Eyes, Daily at bedtime   oxyCODONE-acetaminophen  (PERCOCET/ROXICET) 5-325 MG tablet 1 tablet, Oral, Every 4 hours PRN   pramipexole (MIRAPEX) 0.25 mg, Oral, Nightly    Diet Orders (From admission, onward)     Start     Ordered   12/19/22 0001  Diet NPO time specified  Diet effective midnight        12/18/22 1632            DVT prophylaxis: SCD's Start: 11/29/22 2215   Lab Results  Component Value Date   PLT 338 02/02/2023      Code Status: Full Code  Family Communication: No family at bedside  Status is: Inpatient  Remains inpatient appropriate because: needs placement  Level of care: Progressive  Consultants:  Neurology Neurosurgery PCCM Palliative care  Objective: Vitals:   02/04/23 0329 02/04/23 0500 02/04/23 0759 02/04/23 0813  BP:   110/65   Pulse:   (!) 115 (!) 120  Resp:   20 12  Temp: 99 F (37.2 C)  100 F (37.8 C)   TempSrc: Axillary     SpO2:   96% 92%  Weight:  63.3 kg    Height:        Intake/Output Summary (Last 24 hours) at 02/04/2023 1001 Last data filed at 02/04/2023 0330 Gross per 24 hour  Intake 679 ml  Output 1000 ml  Net -321 ml   Wt Readings from Last 3 Encounters:  02/04/23 63.3 kg    Examination: In bed, NAD  Not following commands, does with draw to pain  Data Reviewed: I have independently reviewed following labs and imaging studies   CBC Recent Labs  Lab 02/02/23 0340  WBC 9.2  HGB 11.2*  HCT 36.7  PLT 338  MCV 96.8  MCH 29.6  MCHC 30.5  RDW 14.9    No results for input(s): "NA", "K", "CL", "CO2", "GLUCOSE", "BUN", "CREATININE", "CALCIUM", "AST", "ALT", "ALKPHOS", "BILITOT", "ALBUMIN", "MG", "CRP", "DDIMER", "PROCALCITON", "LATICACIDVEN", "INR", "TSH", "CORTISOL", "HGBA1C", "AMMONIA", "BNP" in the last 168 hours.  Invalid input(s): "GFRCGP", "PHOSPHOROUS"   ------------------------------------------------------------------------------------------------------------------ No results for input(s): "CHOL", "HDL", "LDLCALC", "TRIG", "CHOLHDL",  "LDLDIRECT" in the last 72 hours.  Lab Results  Component Value Date   HGBA1C 5.5 12/01/2022   ------------------------------------------------------------------------------------------------------------------ No results for input(s): "TSH", "T4TOTAL", "T3FREE", "THYROIDAB" in the last 72 hours.  Invalid input(s): "FREET3"  Cardiac Enzymes No results for input(s): "CKMB", "TROPONINI", "MYOGLOBIN" in the last 168 hours.  Invalid input(s): "CK" ------------------------------------------------------------------------------------------------------------------ No results found for: "BNP"  CBG: Recent Labs  Lab 01/28/23 1150 01/28/23 1738 01/29/23 0749 01/29/23 1157 02/04/23 0757  GLUCAP 128* 115* 112* 151* 108*    Recent Results (from the past 240 hour(s))  Remove and replace urinary cath (placed > 5 days) then obtain urine culture from new indwelling urinary catheter.     Status: Abnormal   Collection Time: 01/31/23  6:32 PM   Specimen: Urine, Catheterized  Result Value Ref Range Status   Specimen Description URINE, CATHETERIZED  Final   Special Requests   Final    NONE Performed at [facility], [address], [place], [place] [address]    Culture (A)  Final    70,000 COLONIES/mL PSEUDOMONAS AERUGINOSA 70,000 COLONIES/mL ENTEROBACTER AEROGENES PSEUDOMONAS AERUGINOSA Two isolates with different morphologies were identified  as the same organism.The most resistant organism was reported.    Report Status 02/03/2023 FINAL  Final   Organism ID, Bacteria PSEUDOMONAS AERUGINOSA (A)  Final   Organism ID, Bacteria ENTEROBACTER AEROGENES (A)  Final      Susceptibility   Enterobacter aerogenes - MIC*    CEFEPIME <=0.12 SENSITIVE Sensitive     CEFTRIAXONE <=0.25 SENSITIVE Sensitive     CIPROFLOXACIN <=0.25 SENSITIVE Sensitive     GENTAMICIN <=1 SENSITIVE Sensitive     IMIPENEM 2 SENSITIVE Sensitive     NITROFURANTOIN 32 SENSITIVE Sensitive     TRIMETH/SULFA <=20  SENSITIVE Sensitive     PIP/TAZO <=4 SENSITIVE Sensitive     * 70,000 COLONIES/mL ENTEROBACTER AEROGENES   Pseudomonas aeruginosa - MIC*    CEFTAZIDIME 16 INTERMEDIATE Intermediate     CIPROFLOXACIN 1 INTERMEDIATE Intermediate     GENTAMICIN <=1 SENSITIVE Sensitive     IMIPENEM >=16 RESISTANT Resistant     * 70,000 COLONIES/mL PSEUDOMONAS AERUGINOSA      Radiology Studies: DG CHEST PORT 1 VIEW  Result Date: 02/03/2023 CLINICAL DATA:  Tachypnea. EXAM: PORTABLE CHEST 1 VIEW COMPARISON:  Most recent radiograph 01/12/2023 FINDINGS: Tracheostomy tube tip projects over the thoracic inlet. Lower lung volumes from prior exam. Mild right greater than left basilar atelectasis. Stable heart size and mediastinal contours. Chronic interstitial coarsening. No significant pleural effusion. No pneumothorax. No pulmonary edema. Stable osseous structures. IMPRESSION: Lower lung volumes from prior exam with bibasilar atelectasis. Electronically Signed   By: Keith Rake M.D.   On: 02/03/2023 17:35     Eulogio Bear DO [facility]  Between 7 am - 7 pm I am available, please contact me via Amion (for emergencies) or Securechat (non urgent messages)  Between 7 pm - 7 am I am not available, please contact night coverage MD/APP via Amion

## 2023-02-04 NOTE — Progress Notes (Signed)
NAME:  Pamela Johnston, MRN:  [id number], DOB:  Apr 25, 1941, LOS: 64 ADMISSION DATE:  11/29/2022, CONSULTATION DATE:  11/29/2022 REFERRING MD:  Leonel Ramsay - Neuro REASON FOR CONSULT: Ventilator management in setting of large IPH  History of Present Illness:  81-yer-old woman who presented to [facility] ED 12/21 as a Code Stroke. LKW 2000 when patient reportedly was in bed and became less responsive with +aphasia and R-sided weakness. One episode of nausea/vomiting en route with EMS. PMHx significant for HTN, chronic sacroiliitis (managed with Percocet), RLS, osteopenia.   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 Labs were grossly unremarkable with WBC mildly elevated to 12 and mild hyperglycemia to 154. Decision was made to intubate patient in ED after several episodes of emesis with concern for aspiration. HTS 3% was initiated.   Stroke service to admit. NSGY consulted with no plan for intervention at this time.   PCCM consulted for ventilator/hemodynamic management.   Pertinent Medical History:  Hypertension Sacroilititis Restless legs syndrome Osteopenia  Significant Hospital Events: Including procedures, antibiotic start and stop dates in addition to other pertinent events   12/21 - Presented to [facility] ED via EMS as Code Stroke. LKW 2000. Abrupt onset of decreased responsiveness, aphasia and R hemiplegia. Stroke admitting, NSGY consulted. PCCM consult for vent management. Treated with unasyn for aspiration pneumonia. CT Head with large L hemispheric parietal/temporal lobe IPH with extension into basal ganglia and IVH. HTS 3% started.   12/24 - Palliative care consulted, family requested meeting to be delayed 12/25 - Family requested goals of care meeting to be delayed 12/26 - Stopped  Unasyn, replete phos; [name] GOC conversation: family says full code 12/27 - Restarted Unasyn for fever, RLL infiltrate; resp culture: enterobacter, MDR. Palliative consulted  12/28 - CT Head unchanged 12/29 - Unasyn changed to bactrim 12/31 - Posturing, CT head with increased shift, increased bleeding 1/2 - Family fired CVA service after discussion RE poor prognosis  1/4 - Off sedation  1/5 - No acute issues overnight. Remains on vent with increased respirations off sedation. Fever curve slightly decreased  1/6 - Pall care meeting moved to 2pm today. [name] the son-in-law will be present. On ven 30%/ On TF. Neo not started last night. MAP > 65 after fluids bolus. Febrile +.  Last CT head 12/09/22.  1/8 - No significant neurologic/clinical change. Brief GOC discussion with husband, Pamela Johnston, at bedside in AM. Concerns from staff re: patient GOC/decisionmaking, want to ensure husband is included in decisions/information sharing. Attempted bedside meeting but family prematurely ended meeting due to escalated discussion. 1/9 - Febrile overnight to 101.58F, remains on bromocriptine. No significant neurologic exam changes. 1/10 Family meeting yesterday with [name], plan for trach/peg though husband says they are still discussing this morning. Per Dt. Hulen Skains " So moving forward, Pamela Johnston is the designated medical decision maker for Pamela Johnston " 1/11 no new issues, plan for PEG tomorrow 1/12 for trach/peg today 1/15 No change in Neuro status , trach and PEG, No weaning , will repeat CT head without, get Case management involved in looking for LTAC ( Day 3 post trach on 1/12)   1/15-CT with some stabilization 1/19 No acute events overnight, respiratory culture 1/15 with abundant Pseudomonas, abundant Staphylococcus aureus, and moderate Enterobacter remains on Meropenem  1/22 trach collar trial 12 +  hrs 1/24 trach collar x 24 hours on 35% 1/29 order placed to change trach to cuffless  2/12 No issues  on ATC collar with room air not oxygen  2/19 tolerating trach collar, remains unresponsive 2/26 tolerating 21% Trach collar  Interim History / Subjective:  Last CXR 2/25 >> Lower lung volumes from prior exam with bibasilar atelectasis.  No labs 2/16 to review Remains comfortable, doing nothing neurologically for me this am.  Neuro status still barrier for decannulation   Objective:   Blood pressure (!) 142/75, pulse (!) 113, temperature 99 F (37.2 C), temperature source Axillary, resp. rate (!) 24, height '5\' 1"'$  (1.549 m), weight 63.3 kg, SpO2 96 %.    FiO2 (%):  [21 %] 21 %   Intake/Output Summary (Last 24 hours) at 02/04/2023 0758 Last data filed at 02/04/2023 0330 Gross per 24 hour  Intake 679 ml  Output 1000 ml  Net -321 ml   Filed Weights   02/02/23 0500 02/03/23 0500 02/04/23 0500  Weight: 63.3 kg 63.3 kg 63.3 kg   Physical Exam: General: Acute on chronically ill appearing unresponsive  female lying in bed, in NAD HEENT: ETT, MM pink/moist, PERRL,  Neuro: Unresponsive  CV: s1s2 regular rate and rhythm, no murmur, rubs, or gallops,  PULM:  6 cuffless trach midline, no increased work of breathing, no added breath sounds  GI: soft, bowel sounds active in all 4 quadrants, non-tender, non-distended, tolerating TF Extremities: warm/dry, no edema  Skin: no rashes or lesions  Resolved Hospital Problem List:  Hypotension Hyperglycemia  Enterobacter pneumonia -Completed 10 days of treatment for  Circulatory shock  Assessment & Plan:   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/mass effect 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

## 2023-02-05 DIAGNOSIS — I619 Nontraumatic intracerebral hemorrhage, unspecified: Secondary | ICD-10-CM | POA: Diagnosis not present

## 2023-02-05 LAB — GLUCOSE, CAPILLARY
Glucose-Capillary: 152 mg/dL — ABNORMAL HIGH (ref 70–99)
Glucose-Capillary: 126 mg/dL — ABNORMAL HIGH (ref 70–99)
Glucose-Capillary: 160 mg/dL — ABNORMAL HIGH (ref 70–99)
Glucose-Capillary: 149 mg/dL — ABNORMAL HIGH (ref 70–99)

## 2023-02-05 NOTE — Progress Notes (Signed)
Physical Therapy Treatment Patient Details Name: Pamela Johnston MRN: [id number] DOB: [birth year] Today's Date: 02/05/2023   History of Present Illness 20-yer-old woman who presented to [facility] ED 12/21 as a Code Stroke with +aphasia and R-sided weakness. 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 Pt underwent trach and peg on 12/19/22. PMHx significant for HTN, chronic sacroiliitis (managed with Percocet), RLS, osteopenia.    PT Comments    Patient with medical issues limiting mobility as with elevated temp, elevated RR and BP on lower side.  She has contractures of L elbow flexion, R elbow extension, L knee flexion and hip adduction and internal rotation.  Grimaces some during ROM, but not following commands or initiating communication.  She may need PT initially at [place] facility to initiate nursing restorative care due to multiple developing contractures.  Will continue 1x/week during acute stay for mobilizing as pt tolerates.    Recommendations for follow up therapy are one component of a multi-disciplinary discharge planning process, led by the attending physician.  Recommendations may be updated based on patient status, additional functional criteria and insurance authorization.  Follow Up Recommendations  Long-term institutional care without follow-up therapy Can patient physically be transported by private vehicle: No   Assistance Recommended at Discharge Frequent or constant Supervision/Assistance  Patient can return home with the following Two people to help with walking and/or transfers;Two people to help with bathing/dressing/bathroom;Assistance with feeding;Assist for transportation;Direct supervision/assist for medications management   Equipment Recommendations  None recommended by PT    Recommendations for Other Services       Precautions / Restrictions  Precautions Precautions: Fall Precaution Comments: trach, PEG     Mobility  Bed Mobility Overal bed mobility: Needs Assistance Bed Mobility: Rolling Rolling: Total assist, +2 for physical assistance         General bed mobility comments: rolling for repositioning and scooting up in bed    Transfers                        Ambulation/Gait                   Stairs             Wheelchair Mobility    Modified Rankin (Stroke Patients Only) Modified Rankin (Stroke Patients Only) Pre-Morbid Rankin Score: Moderately severe disability Modified Rankin: Severe disability     Balance                                            Cognition Arousal/Alertness: Lethargic Behavior During Therapy: Flat affect Overall Cognitive Status: Impaired/Different from baseline                                 General Comments: not following commands, opened eyes briefly during ROM activites        Exercises Other Exercises Other Exercises: PROM x4 extremities in supine noted limitations in L knee extension, L hip abduction/ER L elbow extension and pronation and R shoulder flexion/ER/ABD and elbow flexion    General Comments General comments (skin integrity, edema, etc.): RR 44 and pt warm and diaphoretic, RN in and reports pt with temp and concern for sepsis, placed ice  packs under arms end of session      Pertinent Vitals/Pain Pain Assessment Pain Assessment: Faces Faces Pain Scale: Hurts a little bit Pain Location: grimacing with R hand ROM and with trach suctioning Pain Descriptors / Indicators: Grimacing Pain Intervention(s): Monitored during session, Limited activity within patient's tolerance    Home Living                          Prior Function            PT Goals (current goals can now be found in the care plan section) Acute Rehab PT Goals Patient Stated Goal: unable to state PT Goal Formulation:  Patient unable to participate in goal setting Time For Goal Achievement: 02/19/23 Potential to Achieve Goals: Poor Progress towards PT goals: Not progressing toward goals - comment    Frequency    Min 1X/week      PT Plan Current plan remains appropriate    Co-evaluation              AM-PAC PT "6 Clicks" Mobility   Outcome Measure  Help needed turning from your back to your side while in a flat bed without using bedrails?: Total Help needed moving from lying on your back to sitting on the side of a flat bed without using bedrails?: Total Help needed moving to and from a bed to a chair (including a wheelchair)?: Total Help needed standing up from a chair using your arms (e.g., wheelchair or bedside chair)?: Total Help needed to walk in hospital room?: Total Help needed climbing 3-5 steps with a railing? : Total 6 Click Score: 6    End of Session Equipment Utilized During Treatment: Oxygen Activity Tolerance: Treatment limited secondary to medical complications (Comment) (temp, high RR) Patient left: in bed;with call bell/phone within reach;with bed alarm set   PT Visit Diagnosis: Difficulty in walking, not elsewhere classified (R26.2);Unsteadiness on feet (R26.81)     Time: [id number] PT Time Calculation (min) (ACUTE ONLY): 24 min  Charges:  $Therapeutic Exercise: 8-22 mins $Therapeutic Activity: 8-22 mins                     Magda Kiel, PT Acute Rehabilitation Services Office:[phone] 02/05/2023    Pamela Johnston 02/05/2023, 5:25 PM

## 2023-02-05 NOTE — Progress Notes (Addendum)
PROGRESS NOTE  Pamela Johnston [id number] DOB: 06/07/1941 DOA: 11/29/2022 PCP: Pamela Aly, MD   LOS: 25 days   Brief Narrative / Interim history: 82 year old female with history of hypertension who comes into the hospital and is admitted on 11/29/2022 due to decreased responsiveness, aphasia, right-sided weakness.  CT head on admission showed large IPH of left parietal/left temporal regions with extensions into basal ganglia, secondary dissection with IVH and surrounding vasogenic edema with regional mass effect.  She also had several episodes of vomiting with concern for aspiration.  Neurosurgery, neurology were consulted, she was intubated and admitted to neuro ICU.  She was started on 3% hypertonic saline and palliative care was consulted due to poor prognosis.  She eventually underwent tracheostomy as well as PEG placement, and was also started on Keppra due to concern for seizures.  She is currently on trach collar.  Multiple palliative care discussions have been ongoing with the family, at 1 point hospital leadership was involved, she remains full code/full scope.  Earliest she could be placed is a month out from her tracheostomy placement, 2/12.  Now on the difficult to place list.     Significant events: 12/21 - Presented to [facility] ED via EMS as Code Stroke. LKW 2000. Abrupt onset of decreased responsiveness, aphasia and R hemiplegia. Stroke admitting, NSGY consulted. PCCM consult for vent management. Treated with unasyn for aspiration pneumonia. CT Head with large L hemispheric parietal/temporal lobe IPH with extension into basal ganglia and IVH. HTS 3% started.   12/24 - Palliative care consulted, family requested meeting to be delayed 12/25 - Family requested goals of care meeting to be delayed 12/26 - Stopped Unasyn, replete phos; Pamela Johnston GOC conversation: family says full code 12/27 - Restarted Unasyn for fever, RLL infiltrate; resp culture: enterobacter, MDR. Palliative consulted   12/28 - CT Head unchanged 12/29 - Unasyn changed to bactrim 12/31 - Posturing, CT head with increased shift, increased bleeding 1/2 - Family fired CVA service after discussion RE poor prognosis  1/4 - Off sedation  1/5 - No acute issues overnight. Remains on vent with increased respirations off sedation. Fever curve slightly decreased  1/6 - Pall care meeting moved to 2pm today. Pamela Johnston the son-in-law will be present. On ven 30%/ On TF. Neo not started last night. MAP > 65 after fluids bolus. Febrile +.  Last CT head 12/09/22.  1/8 - No significant neurologic/clinical change. Brief GOC discussion with husband, Pamela Johnston, at bedside in AM. Concerns from staff re: patient GOC/decisionmaking, want to ensure husband is included in decisions/information sharing. Attempted bedside meeting but family prematurely ended meeting due to escalated discussion. 1/9 - Febrile overnight to 101.69F, remains on bromocriptine. No significant neurologic exam changes. 1/10 Family meeting yesterday with Dr. Hulen Johnston, plan for trach/peg though husband says they are still discussing this morning. Per Dt. Pamela Johnston " So moving forward, Pamela Johnston is the designated medical decision maker for Pamela Johnston " 1/11 no new issues, plan for PEG tomorrow 1/12 for trach/peg today 1/15 No change in Neuro status , trach and PEG, No weaning , will repeat CT head without, get Case management involved in looking for LTAC ( Day 3 post trach on 1/12)   1/15-CT with some stabilization 1/19 No acute events overnight, respiratory culture 1/15 with abundant Pseudomonas, abundant Staphylococcus aureus, and moderate Enterobacter remains on Meropenem  1/22 trach collar trial 12 + hrs 1/24 trach collar x 24 hours on 35% 1/25- concern for seizures-- keppra started after EEG done 2/12- concern  for seizures-- EEG reordered, negative 2/19 -PCCM saw for trach maintenance 2/25: ? Aspiration event with PNA  Subjective / 24h Interval events: Episode of  excessive/thick secretions-- tube feeds held overnight and resolved-- ? Aspiration PNA  Assesement and Plan: Principal Problem:   ICH (intracerebral hemorrhage) (HCC) Active Problems:   RLS (restless legs syndrome)   Osteopenia   Sacroiliitis (HCC)   Benign essential HTN   Aspiration into airway   Hypoxia   Intraparenchymal hemorrhage of brain (HCC)   Brain compression (HCC)   Acute respiratory failure with hypoxia (HCC)   Pneumonia due to Enterobacter aerogenes (HCC)   Goals of care, counseling/discussion   Pressure injury of skin   Tracheostomy status ([place])   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/mass effect Brain compression/ICH Right parietal lobe hemorrhage with associated edema  Severe encephalopathy Intraventricular hemorrhage and mass effect, vasogenic edema, subfalcine herniation -Per prior team members, she has a very poor prognosis.  Multiple family meetings concluded over time with CMO on board, medical decision maker is Pamela Johnston patient's son-in-law. Stroke team signed off. Maintain neuro protective measures. Nutrition and bowel regimen. Aspiration precautions   Fevers - Continue to closely monitor -  Prior respiratory cultures showing Pseudomonas, most recent on 01/03/2023 showing Carbapenem resistance.  She has been treated for pneumonia in the past, monitor off antibiotics, but if needed could use Zerbaxa per ID given resistance.  D-dimer was elevated, lower extremity ultrasound was negative for DVT.  PE is in the differential however she is not having worsening respiratory status, may not be stable to transfer down for CT angiogram, and also she could not be anticoagulated due to her devastating hemorrhagic stroke -urine culture pseudomonas-- suspect colonized -? Aspiration PNA-- has episode of vomiting and then developed tachypnea-- will treat with unasyn for now  Concern for seizures - On Keppra, underwent a repeat  EEG on 2/12 and repeat neurology consulted, due to left sided rhythmic movements.  EEG did not show any seizures.  Neurology signed off   Acute respiratory failure with hypoxia due to large intracerebral hemorrhage - S/p trach/PEG 1/12. Previously completed 10 days of antibiotic coverage for anterobasilar pneumonia, repeat sputum culture obtained 1/15 positive for abundant Pseudomonas, abundant Staphylococcus aureus, and moderate Enterobacter, s/p course of Meropenem -Continue ATC as tolerated, hope to avoid vent needs -PRN lasix -Wean O2 down as tolerated -Bronchial hygiene -Appreciate PCCM follow-up, last seen 2/26, she is being seen weekly   Urinary retention -foley placed 1/23, Will need urologic follow up as outpatient   History of chronic sacroiliitis Osteopenia RLS -Supportive care  Scheduled Meds:  Chlorhexidine Gluconate Cloth  6 each Topical Daily   famotidine  20 mg Per Tube QHS   feeding supplement (PROSource TF20)  60 mL Per Tube Daily   fiber supplement (BANATROL TF)  60 mL Per Tube BID   Gerhardt's butt cream   Topical QID   latanoprost  1 drop Both Eyes QHS   levETIRAcetam  500 mg Per Tube BID   metoprolol tartrate  12.5 mg Per Tube BID   nutrition supplement (JUVEN)  1 packet Per Tube BID BM   mouth rinse  15 mL Mouth Rinse Q2H   phenylephrine-shark liver oil-mineral oil-petrolatum  1 Application Rectal BID   rosuvastatin  20 mg Per Tube Daily   Continuous Infusions:  sodium chloride Stopped (01/01/23 0424)   ampicillin-sulbactam (UNASYN) IV 3 g (02/05/23 0816)   feeding supplement (JEVITY 1.2 CAL) 1,000 mL (02/04/23 1804)  PRN Meds:.acetaminophen **OR** acetaminophen (TYLENOL) oral liquid 160 mg/5 mL **OR** acetaminophen, bisacodyl, docusate, glycopyrrolate, loperamide HCl, morphine injection, mouth rinse, polyethylene glycol  Current Outpatient Medications  Medication Instructions   latanoprost (XALATAN) 0.005 % ophthalmic solution 1 drop, Both Eyes, Daily  at bedtime   oxyCODONE-acetaminophen (PERCOCET/ROXICET) 5-325 MG tablet 1 tablet, Oral, Every 4 hours PRN   pramipexole (MIRAPEX) 0.25 mg, Oral, Nightly    Diet Orders (From admission, onward)     Start     Ordered   12/19/22 0001  Diet NPO time specified  Diet effective midnight        12/18/22 1632            DVT prophylaxis: SCD's Start: 11/29/22 2215   Lab Results  Component Value Date   PLT 338 02/02/2023      Code Status: Full Code  Family Communication: updated family at bedside  Status is: Inpatient  Remains inpatient appropriate because: needs placement  Level of care: Progressive  Consultants:  Neurology Neurosurgery PCCM Palliative care  Objective: Vitals:   02/05/23 0306 02/05/23 0334 02/05/23 0810 02/05/23 0930  BP:  (!) 145/70 (!) 136/58 (!) 110/47  Pulse: (!) 123 (!) 118 (!) 128 (!) 112  Resp: (!) 22 (!) 26 (!) 42 (!) 29  Temp:  99.9 F (37.7 C) (!) 102.6 F (39.2 C) 98.5 F (36.9 C)  TempSrc:  Oral Oral Oral  SpO2: 94% 95% 94% 96%  Weight:  59.6 kg    Height:        Intake/Output Summary (Last 24 hours) at 02/05/2023 1001 Last data filed at 02/05/2023 [id number] Gross per 24 hour  Intake --  Output 950 ml  Net -950 ml   Wt Readings from Last 3 Encounters:  02/05/23 59.6 kg    Examination: In bed, NAD  Not following commands, does withdraw to pain  Data Reviewed: I have independently reviewed following labs and imaging studies   CBC Recent Labs  Lab 02/02/23 0340  WBC 9.2  HGB 11.2*  HCT 36.7  PLT 338  MCV 96.8  MCH 29.6  MCHC 30.5  RDW 14.9    No results for input(s): "NA", "K", "CL", "CO2", "GLUCOSE", "BUN", "CREATININE", "CALCIUM", "AST", "ALT", "ALKPHOS", "BILITOT", "ALBUMIN", "MG", "CRP", "DDIMER", "PROCALCITON", "LATICACIDVEN", "INR", "TSH", "CORTISOL", "HGBA1C", "AMMONIA", "BNP" in the last 168 hours.  Invalid input(s): "GFRCGP",  "PHOSPHOROUS"   ------------------------------------------------------------------------------------------------------------------ No results for input(s): "CHOL", "HDL", "LDLCALC", "TRIG", "CHOLHDL", "LDLDIRECT" in the last 72 hours.  Lab Results  Component Value Date   HGBA1C 5.5 12/01/2022   ------------------------------------------------------------------------------------------------------------------ No results for input(s): "TSH", "T4TOTAL", "T3FREE", "THYROIDAB" in the last 72 hours.  Invalid input(s): "FREET3"  Cardiac Enzymes No results for input(s): "CKMB", "TROPONINI", "MYOGLOBIN" in the last 168 hours.  Invalid input(s): "CK" ------------------------------------------------------------------------------------------------------------------ No results found for: "BNP"  CBG: Recent Labs  Lab 01/29/23 1157 02/04/23 0757 02/05/23 0830  GLUCAP 151* 108* 152*    Recent Results (from the past 240 hour(s))  Remove and replace urinary cath (placed > 5 days) then obtain urine culture from new indwelling urinary catheter.     Status: Abnormal   Collection Time: 01/31/23  6:32 PM   Specimen: Urine, Catheterized  Result Value Ref Range Status   Specimen Description URINE, CATHETERIZED  Final   Special Requests   Final    NONE Performed at [facility], [address], [place], [place] [address]    Culture (A)  Final    70,000 COLONIES/mL PSEUDOMONAS AERUGINOSA 70,000  COLONIES/mL ENTEROBACTER AEROGENES PSEUDOMONAS AERUGINOSA Two isolates with different morphologies were identified as the same organism.The most resistant organism was reported.    Report Status 02/03/2023 FINAL  Final   Organism ID, Bacteria PSEUDOMONAS AERUGINOSA (A)  Final   Organism ID, Bacteria ENTEROBACTER AEROGENES (A)  Final      Susceptibility   Enterobacter aerogenes - MIC*    CEFEPIME <=0.12 SENSITIVE Sensitive     CEFTRIAXONE <=0.25 SENSITIVE Sensitive     CIPROFLOXACIN <=0.25  SENSITIVE Sensitive     GENTAMICIN <=1 SENSITIVE Sensitive     IMIPENEM 2 SENSITIVE Sensitive     NITROFURANTOIN 32 SENSITIVE Sensitive     TRIMETH/SULFA <=20 SENSITIVE Sensitive     PIP/TAZO <=4 SENSITIVE Sensitive     * 70,000 COLONIES/mL ENTEROBACTER AEROGENES   Pseudomonas aeruginosa - MIC*    CEFTAZIDIME 16 INTERMEDIATE Intermediate     CIPROFLOXACIN 1 INTERMEDIATE Intermediate     GENTAMICIN <=1 SENSITIVE Sensitive     IMIPENEM >=16 RESISTANT Resistant     * 70,000 COLONIES/mL PSEUDOMONAS AERUGINOSA      Radiology Studies: No results found.   Eulogio Bear DO [facility]  Between 7 am - 7 pm I am available, please contact me via Amion (for emergencies) or Securechat (non urgent messages)  Between 7 pm - 7 am I am not available, please contact night coverage MD/APP via Amion

## 2023-02-05 NOTE — Progress Notes (Signed)
CSW sent secure email to [name] requesting a copy of the POA paperwork.  Madilyn Fireman, MSW, LCSW Transitions of Care  Clinical Social Worker II [phone]

## 2023-02-06 DIAGNOSIS — I612 Nontraumatic intracerebral hemorrhage in hemisphere, unspecified: Secondary | ICD-10-CM | POA: Diagnosis not present

## 2023-02-06 LAB — BASIC METABOLIC PANEL
Anion gap: 10 (ref 5–15)
BUN: 28 mg/dL — ABNORMAL HIGH (ref 8–23)
CO2: 26 mmol/L (ref 22–32)
Calcium: 8.2 mg/dL — ABNORMAL LOW (ref 8.9–10.3)
Chloride: 106 mmol/L (ref 98–111)
Creatinine, Ser: 0.48 mg/dL (ref 0.44–1.00)
GFR, Estimated: >60 mL/min (ref >60)
Glucose, Bld: 131 mg/dL — ABNORMAL HIGH (ref 70–99)
Potassium: 3 mmol/L — ABNORMAL LOW (ref 3.5–5.1)
Sodium: 142 mmol/L (ref 135–145)

## 2023-02-06 LAB — CBC
HCT: 35.3 % — ABNORMAL LOW (ref 36.0–46.0)
Hemoglobin: 10.9 g/dL — ABNORMAL LOW (ref 12.0–15.0)
MCH: 29.7 pg (ref 26.0–34.0)
MCHC: 30.9 g/dL (ref 30.0–36.0)
MCV: 96.2 fL (ref 80.0–100.0)
Platelets: 267 10*3/uL (ref 150–400)
RBC: 3.67 MIL/uL — ABNORMAL LOW (ref 3.87–5.11)
RDW: 14.5 % (ref 11.5–15.5)
WBC: 10.9 10*3/uL — ABNORMAL HIGH (ref 4.0–10.5)
nRBC: 0 % (ref 0.0–0.2)

## 2023-02-06 MED ORDER — POTASSIUM CHLORIDE 20 MEQ PO PACK
40.0000 meq | PACK | Freq: Two times a day (BID) | ORAL | Status: AC
  Administered 2023-02-06 (×2): 40 meq via ORAL
  Filled 2023-02-06 (×2): qty 2

## 2023-02-06 MED ORDER — SODIUM CHLORIDE 0.9 % IV SOLN
1.5000 g | Freq: Three times a day (TID) | INTRAVENOUS | Status: AC
  Administered 2023-02-06 – 2023-02-13 (×21): 1.5 g via INTRAVENOUS
  Filled 2023-02-06 (×24): qty 11.4

## 2023-02-06 NOTE — Progress Notes (Signed)
Pharmacy Antibiotic Note  Pamela Johnston is a 82 y.o. female admitted on [id number] with complicated admission. Pt with recurrent Pseudomonas in urine with various drug resistances noted, possible lung involvement as well. Pharmacy has been consulted for Zerbaxa dosing per ID recommendation, planning for full consult tomorrow. CrCl ~48 ml/min.  Plan: Zerbaxa 1.5g IV q8h   Height: '5\' 1"'$  (154.9 cm) Weight: 60.6 kg (133 lb 9.6 oz) IBW/kg (Calculated) : 47.8  Temp (24hrs), Avg:99.1 F (37.3 C), Min:97.4 F (36.3 C), Max:101.4 F (38.6 C)  Recent Labs  Lab 02/02/23 0340 02/06/23 0654  WBC 9.2 10.9*  CREATININE  --  0.48    Estimated Creatinine Clearance: 47.6 mL/min (by C-G formula based on SCr of 0.48 mg/dL).    Allergies  Allergen Reactions   Nickel Other (See Comments)    Verified by Allergist   Penicillin G Other (See Comments)    Unknown reaction Tolerated Unasyn 11/2022.  TDD.    Arrie Senate, PharmD, BCPS, [facility] Clinical Pharmacist [phone] Please check AMION for all [place] numbers 02/06/2023

## 2023-02-06 NOTE — Progress Notes (Signed)
PROGRESS NOTE  Pamela Johnston [id number] DOB: [birth year] DOA: 11/29/2022 PCP: Pamela Aly, MD   LOS: 47 days   Brief Narrative / Interim history: 82 year old female with history of hypertension who comes into the hospital and is admitted on 11/29/2022 due to decreased responsiveness, aphasia, right-sided weakness.  CT head on admission showed large IPH of left parietal/left temporal regions with extensions into basal ganglia, secondary dissection with IVH and surrounding vasogenic edema with regional mass effect.  She also had several episodes of vomiting with concern for aspiration.  Neurosurgery, neurology were consulted, she was intubated and admitted to neuro ICU.  She was started on 3% hypertonic saline and palliative care was consulted due to poor prognosis.  She eventually underwent tracheostomy as well as PEG placement, and was also started on Keppra due to concern for seizures.  She is currently on trach collar.  Multiple palliative care discussions have been ongoing with the family, at 1 point hospital leadership was involved, she remains full code/full scope.  Earliest she could be placed is a month out from her tracheostomy placement, 2/12.  Now on the difficult to place list.     Significant events: 12/21 - Presented to [facility] ED via EMS as Code Stroke. LKW 2000. Abrupt onset of decreased responsiveness, aphasia and R hemiplegia. Stroke admitting, NSGY consulted. PCCM consult for vent management. Treated with unasyn for aspiration pneumonia. CT Head with large L hemispheric parietal/temporal lobe IPH with extension into basal ganglia and IVH. HTS 3% started.   12/24 - Palliative care consulted, family requested meeting to be delayed 12/25 - Family requested goals of care meeting to be delayed 12/26 - Stopped Unasyn, replete phos; Pamela Johnston conversation: family says full code 12/27 - Restarted Unasyn for fever, RLL infiltrate; resp culture: enterobacter, MDR. Palliative consulted   12/28 - CT Head unchanged 12/29 - Unasyn changed to bactrim 12/31 - Posturing, CT head with increased shift, increased bleeding 1/2 - Family fired CVA service after discussion RE poor prognosis  1/4 - Off sedation  1/5 - No acute issues overnight. Remains on vent with increased respirations off sedation. Fever curve slightly decreased  1/6 - Pall care meeting moved to 2pm today. Pamela Johnston the son-in-law will be present. On ven 30%/ On TF. Neo not started last night. MAP > 65 after fluids bolus. Febrile +.  Last CT head 12/09/22.  1/8 - No significant neurologic/clinical change. Brief Johnston discussion with husband, Pamela Johnston, at bedside in AM. Concerns from staff re: patient Johnston/decisionmaking, want to ensure husband is included in decisions/information sharing. Attempted bedside meeting but family prematurely ended meeting due to escalated discussion. 1/9 - Febrile overnight to 101.52F, remains on bromocriptine. No significant neurologic exam changes. 1/10 Family meeting yesterday with Dr. Hulen Johnston, plan for trach/peg though husband says they are still discussing this morning. Per Dt. Pamela Johnston " So moving forward, Pamela Johnston is the designated medical decision maker for Pamela Johnston " 1/11 no new issues, plan for PEG tomorrow 1/12 for trach/peg today 1/15 No change in Neuro status , trach and PEG, No weaning , will repeat CT head without, get Case management involved in looking for LTAC ( Pamela 3 post trach on 1/12)   1/15-CT with some stabilization 1/19 No acute events overnight, respiratory culture 1/15 with abundant Pseudomonas, abundant Staphylococcus aureus, and moderate Enterobacter remains on Meropenem  1/22 trach collar trial 12 + hrs 1/24 trach collar x 24 hours on 35% 1/25- concern for seizures-- keppra started after EEG done 2/12- concern  for seizures-- EEG reordered, negative 2/19 -PCCM saw for trach maintenance 2/25: ? Aspiration event with PNA  Subjective / 24h Interval events: No overnight  events, afebrile this morning.  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  Multiple family meetings concluded over time with CMO on board, medical decision maker is Pamela Johnston patient's son-in-law. Stroke team signed off. Maintain neuro protective measures. Nutrition and bowel regimen. Aspiration precautions   Fevers - Continue to closely monitor -  Prior respiratory cultures showing Pseudomonas, most recent on 01/03/2023 showing Carbapenem resistance.  She has been treated for pneumonia in the past, monitor off antibiotics, but if needed could use Zerbaxa per ID given resistance.  D-dimer was elevated, lower extremity ultrasound was negative for DVT.  PE is in the differential however she is not having worsening respiratory status, may not be stable to transfer down for CT angiogram, and also she could not be anticoagulated due to her devastating hemorrhagic stroke -urine culture pseudomonas-- suspect colonized -? Aspiration PNA-- has episode of vomiting and then developed tachypnea--continue Unasyn, this was started on 2/25, today's Pamela Johnston  Concern for seizures - On Keppra, underwent a repeat EEG on  2/12 and repeat neurology consulted, due to left sided rhythmic movements.  EEG did not show any seizures.  Neurology signed off   Acute respiratory failure with hypoxia due to large intracerebral hemorrhage - S/p trach/PEG 1/12. Previously completed 10 days of antibiotic coverage for anterobasilar pneumonia, repeat sputum culture obtained 1/15 positive for abundant Pseudomonas, abundant Staphylococcus aureus, and moderate Enterobacter, s/p course of Meropenem -Continue ATC as tolerated, hope to avoid vent needs -PRN lasix -Wean O2 down as tolerated -Bronchial hygiene -Appreciate PCCM follow-up, last seen 2/26, she is being seen weekly   Urinary retention -foley placed 1/23, Will need urologic follow up as outpatient   History of chronic sacroiliitis Osteopenia RLS -Supportive care  Scheduled Meds:  Chlorhexidine Gluconate Cloth  6 each Topical Daily   famotidine  20 mg Per Tube QHS   feeding supplement (PROSource TF20)  60 mL Per Tube Daily   fiber supplement (BANATROL TF)  60 mL Per Tube BID   Gerhardt's butt cream   Topical QID   latanoprost  1 drop Both Eyes QHS   levETIRAcetam  500 mg Per Tube BID   metoprolol tartrate  12.5 mg Per Tube BID   nutrition supplement (JUVEN)  1 packet Per Tube BID BM   mouth rinse  15 mL Mouth Rinse Q2H   phenylephrine-shark liver oil-mineral oil-petrolatum  1 Application Rectal BID   potassium chloride  40 mEq Oral BID   rosuvastatin  20 mg Per Tube Daily   Continuous Infusions:  sodium chloride Stopped (01/01/23 0424)   ampicillin-sulbactam (UNASYN) IV 3 g (02/06/23 0843)   feeding  supplement (JEVITY 1.2 CAL) 1,000 mL (02/05/23 1912)   PRN Meds:.acetaminophen **OR** acetaminophen (TYLENOL) oral liquid 160 mg/5 mL **OR** acetaminophen, bisacodyl, docusate, glycopyrrolate, loperamide HCl, morphine injection, mouth rinse, polyethylene glycol  Current Outpatient Medications  Medication Instructions   latanoprost (XALATAN) 0.005 % ophthalmic  solution 1 drop, Both Eyes, Daily at bedtime   oxyCODONE-acetaminophen (PERCOCET/ROXICET) 5-325 MG tablet 1 tablet, Oral, Every 4 hours PRN   pramipexole (MIRAPEX) 0.25 mg, Oral, Nightly    Diet Orders (From admission, onward)     Start     Ordered   12/19/22 0001  Diet NPO time specified  Diet effective midnight        12/18/22 1632            DVT prophylaxis: SCD's Start: 11/29/22 2215   Lab Results  Component Value Date   PLT 267 02/06/2023      Code Status: Full Code  Family Communication: No family at bedside  Status is: Inpatient  Remains inpatient appropriate because: needs placement  Level of care: Progressive  Consultants:  Neurology Neurosurgery PCCM Palliative care  Objective: Vitals:   02/06/23 0300 02/06/23 0354 02/06/23 0500 02/06/23 0754  BP:      Pulse: (!) 104 (!) 105  (!) 116  Resp: (!) 22 (!) 23  (!) 22  Temp: 99.8 F (37.7 C)     TempSrc: Axillary     SpO2: 96% 100%  97%  Weight:   60.6 kg   Height:        Intake/Output Summary (Last 24 hours) at 02/06/2023 0847 Last data filed at 02/06/2023 0600 Gross per 24 hour  Intake 720.39 ml  Output 450 ml  Net 270.39 ml    Wt Readings from Last 3 Encounters:  02/06/23 60.6 kg    Examination: Constitutional: NAD Eyes: lids and conjunctivae normal, no scleral icterus ENMT: mmm Neck: normal, supple Respiratory: Bibasilar rhonchi, no wheezing Cardiovascular: Regular rate and rhythm, no murmurs / rubs / gallops. No LE edema. Abdomen: soft, no distention, no tenderness. Bowel sounds positive.   Data Reviewed: I have independently reviewed following labs and imaging studies   CBC Recent Labs  Lab 02/02/23 0340 02/06/23 0654  WBC 9.2 10.9*  HGB 11.2* 10.9*  HCT 36.7 35.3*  PLT 338 267  MCV 96.8 96.2  MCH 29.6 29.7  MCHC 30.5 30.9  RDW 14.9 14.5     Recent Labs  Lab 02/06/23 0654  NA 142  K 3.0*  CL 106  CO2 26  GLUCOSE 131*  BUN 28*  CREATININE 0.48  CALCIUM 8.2*      ------------------------------------------------------------------------------------------------------------------ No results for input(s): "CHOL", "HDL", "LDLCALC", "TRIG", "CHOLHDL", "LDLDIRECT" in the last 72 hours.  Lab Results  Component Value Date   HGBA1C 5.5 12/01/2022   ------------------------------------------------------------------------------------------------------------------ No results for input(s): "TSH", "T4TOTAL", "T3FREE", "THYROIDAB" in the last 72 hours.  Invalid input(s): "FREET3"  Cardiac Enzymes No results for input(s): "CKMB", "TROPONINI", "MYOGLOBIN" in the last 168 hours.  Invalid input(s): "CK" ------------------------------------------------------------------------------------------------------------------ No results found for: "BNP"  CBG: Recent Labs  Lab 02/04/23 0757 02/05/23 0830 02/05/23 1157 02/05/23 1548 02/05/23 1911  GLUCAP 108* 152* 126* 160* 149*     Recent Results (from the past 240 hour(s))  Remove and replace urinary cath (placed > 5 days) then obtain urine culture from new indwelling urinary catheter.     Status: Abnormal   Collection Time: 01/31/23  6:32 PM   Specimen: Urine, Catheterized  Result Value Ref Range Status  Specimen Description URINE, CATHETERIZED  Final   Special Requests   Final    NONE Performed at [facility], [place] [address] [place], [place] [address]    Culture (A)  Final    70,000 COLONIES/mL PSEUDOMONAS AERUGINOSA 70,000 COLONIES/mL ENTEROBACTER AEROGENES PSEUDOMONAS AERUGINOSA Two isolates with different morphologies were identified as the same organism.The most resistant organism was reported.    Report Status 02/03/2023 FINAL  Final   Organism ID, Bacteria PSEUDOMONAS AERUGINOSA (A)  Final   Organism ID, Bacteria ENTEROBACTER AEROGENES (A)  Final      Susceptibility   Enterobacter aerogenes - MIC*    CEFEPIME <=0.12 SENSITIVE Sensitive     CEFTRIAXONE <=0.25 SENSITIVE Sensitive      CIPROFLOXACIN <=0.25 SENSITIVE Sensitive     GENTAMICIN <=1 SENSITIVE Sensitive     IMIPENEM 2 SENSITIVE Sensitive     NITROFURANTOIN 32 SENSITIVE Sensitive     TRIMETH/SULFA <=20 SENSITIVE Sensitive     PIP/TAZO <=4 SENSITIVE Sensitive     * 70,000 COLONIES/mL ENTEROBACTER AEROGENES   Pseudomonas aeruginosa - MIC*    CEFTAZIDIME 16 INTERMEDIATE Intermediate     CIPROFLOXACIN 1 INTERMEDIATE Intermediate     GENTAMICIN <=1 SENSITIVE Sensitive     IMIPENEM >=16 RESISTANT Resistant     * 70,000 COLONIES/mL PSEUDOMONAS AERUGINOSA      Radiology Studies: No results found.  Jordanna Hendrie M. Cruzita Lederer, MD, PhD [facility]  Between 7 am - 7 pm you can contact me via Amion (for emergencies) or [place] (non urgent matters).  I am not available 7 pm - 7 am, please contact night coverage MD/APP via Amion

## 2023-02-07 DIAGNOSIS — I619 Nontraumatic intracerebral hemorrhage, unspecified: Secondary | ICD-10-CM | POA: Diagnosis not present

## 2023-02-07 DIAGNOSIS — R509 Fever, unspecified: Secondary | ICD-10-CM

## 2023-02-07 DIAGNOSIS — I612 Nontraumatic intracerebral hemorrhage in hemisphere, unspecified: Secondary | ICD-10-CM | POA: Diagnosis not present

## 2023-02-07 LAB — COMPREHENSIVE METABOLIC PANEL
ALT: 64 U/L — ABNORMAL HIGH (ref 0–44)
AST: 102 U/L — ABNORMAL HIGH (ref 15–41)
Albumin: 2.4 g/dL — ABNORMAL LOW (ref 3.5–5.0)
Alkaline Phosphatase: 164 U/L — ABNORMAL HIGH (ref 38–126)
Anion gap: 10 (ref 5–15)
BUN: 30 mg/dL — ABNORMAL HIGH (ref 8–23)
CO2: 24 mmol/L (ref 22–32)
Calcium: 8.5 mg/dL — ABNORMAL LOW (ref 8.9–10.3)
Chloride: 109 mmol/L (ref 98–111)
Creatinine, Ser: 0.52 mg/dL (ref 0.44–1.00)
GFR, Estimated: >60 mL/min (ref >60)
Glucose, Bld: 115 mg/dL — ABNORMAL HIGH (ref 70–99)
Potassium: 4.7 mmol/L (ref 3.5–5.1)
Sodium: 143 mmol/L (ref 135–145)
Total Bilirubin: 1.1 mg/dL (ref 0.3–1.2)
Total Protein: 6.9 g/dL (ref 6.5–8.1)

## 2023-02-07 LAB — CBC
HCT: 43.7 % (ref 36.0–46.0)
Hemoglobin: 13.3 g/dL (ref 12.0–15.0)
MCH: 29.8 pg (ref 26.0–34.0)
MCHC: 30.4 g/dL (ref 30.0–36.0)
MCV: 98 fL (ref 80.0–100.0)
Platelets: 247 10*3/uL (ref 150–400)
RBC: 4.46 MIL/uL (ref 3.87–5.11)
RDW: 14.4 % (ref 11.5–15.5)
WBC: 12.7 10*3/uL — ABNORMAL HIGH (ref 4.0–10.5)
nRBC: 0 % (ref 0.0–0.2)

## 2023-02-07 LAB — MAGNESIUM: Magnesium: 2.2 mg/dL (ref 1.7–2.4)

## 2023-02-07 MED ORDER — FUROSEMIDE 10 MG/ML IJ SOLN
20.0000 mg | Freq: Once | INTRAMUSCULAR | Status: AC
  Administered 2023-02-07: 20 mg via INTRAVENOUS
  Filled 2023-02-07: qty 2

## 2023-02-07 NOTE — Consult Note (Signed)
[facility] for Infectious Disease    Date of Admission:  11/29/2022     Total days of antibiotics   Zerbaxa 2/28 >> c   Merrem 1/17 - 1/21 (PNA)  TMP/Sulfa SS 12/29 - 1/07  Unasyn 12/21 - 12/29;  2/25 - 2/28 (PNA)            Reason for Consult: Fevers, MDR PSA   Referring Provider: Cruzita Lederer Primary Care Provider:   Assessment: Pamela Johnston is a 82 y.o. female admitted November 29, 2022 with catastrophic ICH/IVH inducing brain injury d/t mass effect. She has remained in the neuro ICU/SDU since admission for care and progressed to tracheostomy and PEG for enteral nutrition. Over the course of her hospitalization she has received a few courses of antibiotics for undifferentiated fevers and leukocytosis detailed above with targets of presumed HCAP/ASP PNA, UTIs.   Has grown out Pseudomonas aeruginosa from trach and urine cultures with patterns concerning for increasing drug resistance - now meropenem, zosyn, cefepime resistant and FQ intermediate.  She does not have any central lines in place, sacral wound, while deep has not yet evolved to any significant open wound. No blood cultures drawn and probably would be low yield now. Will complete 7d course of Zerbaxa to treat for possible pneumonia given increased secretions described (though O2 requirements stable on trach collar) vs urine source. I am fairly certain her trach is colonized with pseudomonas; given her unresponsive state it is difficult to differentiate source of her fevers.  Certainly at risk for repeated infections d/t medical devices and further acquisition of antimicrobial resistance.    Plan: 7d course of Zerbaxa planned  Will follow up remotely fever curve/leukocytosis    Principal Problem:   ICH (intracerebral hemorrhage) (HCC) Active Problems:   RLS (restless legs syndrome)   Osteopenia   Sacroiliitis (HCC)   Benign essential HTN   Aspiration into airway   Hypoxia   Intraparenchymal hemorrhage of  brain (HCC)   Brain compression (HCC)   Acute respiratory failure with hypoxia (HCC)   Pneumonia due to Enterobacter aerogenes (HCC)   Goals of care, counseling/discussion   Pressure injury of skin   Tracheostomy status (HCC)    Chlorhexidine Gluconate Cloth  6 each Topical Daily   famotidine  20 mg Per Tube QHS   feeding supplement (PROSource TF20)  60 mL Per Tube Daily   fiber supplement (BANATROL TF)  60 mL Per Tube BID   Gerhardt's butt cream   Topical QID   latanoprost  1 drop Both Eyes QHS   levETIRAcetam  500 mg Per Tube BID   metoprolol tartrate  12.5 mg Per Tube BID   nutrition supplement (JUVEN)  1 packet Per Tube BID BM   mouth rinse  15 mL Mouth Rinse Q2H   phenylephrine-shark liver oil-mineral oil-petrolatum  1 Application Rectal BID   rosuvastatin  20 mg Per Tube Daily    HPI: Pamela Johnston is a 82 y.o. female admitted from home 11/29/22 d/t aphasia, decreased LOC and right sided weakness. CT head on admission with large IPH with extension to basal ganglia 2/2 dissection with IVH and surrounding vasogenic edema and mass effect.   Patient is non responsive on ventilator. History was obtained from chart.   In brief:  Patient presented 11/29/22 as code stroke - patient intubated d/t decreased LOC and aspiration. Tx with unasyn. Poor prognosis neurologically declared by NSGY and Neurology team. GOC/PMT consultation recommended.  12/31  patient now posturing with increased shift and bleeding.  Off and on pressors in ICU with intermittent fevers w/o signs of occult infectious process.  Met with [name] re: medical decision making after what seems to be some discordance between her husband, [name] and [place], son in law David--> SIL, Shanon Brow is Media planner.  Trach and PEG placed and looking into LTAC for further care.  Received course of meropenem for respiratory cx revealing pseudomonas, MSSA and enterobacter cloacea.  1/25 concern for seizures - keppra and EEG done  (negative).  2/25 possible aspiration event with PNA. Placed on IV unasyn however fever curve worsening, bedside RN reports nearly constant tracheal suctioning d/t copious sxns. Now drug resistant pseudomonas in both tracheal aspirate and urine cutlures (70k colony CFU in presence of chronic indwelling foley). She has had multiple attempts at foley removal trials however d/t persistent retention she is dependent on this.   Consulted by Dr. Cruzita Lederer over concern for persistent fevers/leukocytosis with MDR organisms cultured out from urine despite unasyn for presumed asp pneumonia component.    Review of Systems: ROS - unable to obtain    Past Medical History:  Diagnosis Date   Benign essential HTN 11/29/2022   Osteopenia 11/29/2022   RLS (restless legs syndrome) 11/29/2022   Sacroiliitis ([place]) 11/29/2022       No family history on file. Allergies  Allergen Reactions   Nickel Other (See Comments)    Verified by Allergist   Penicillin G Other (See Comments)    Unknown reaction Tolerated Unasyn 11/2022.  TDD.    OBJECTIVE: Blood pressure (!) 150/84, pulse (!) 115, temperature 98.2 F (36.8 C), temperature source Oral, resp. rate (!) 24, height '5\' 1"'$  (1.549 m), weight 63.8 kg, SpO2 97 %.  Physical Exam Constitutional:      Appearance: She is ill-appearing.     Comments: Unresponsive on ventilator   Pulmonary:     Comments: Remains on TC 6L however large volume thin tan secretions noted in cannister.  Abdominal:     General: There is no distension.     Palpations: Abdomen is soft.     Tenderness: There is no abdominal tenderness.  Skin:    General: Skin is warm and dry.     Comments: Reported DTI to sacrum evolving.      Lab Results Lab Results  Component Value Date   WBC 12.7 (H) 02/07/2023   HGB 13.3 02/07/2023   HCT 43.7 02/07/2023   MCV 98.0 02/07/2023   PLT 247 02/07/2023    Lab Results  Component Value Date   CREATININE 0.52 02/07/2023   BUN 30 (H)  02/07/2023   NA 143 02/07/2023   K 4.7 02/07/2023   CL 109 02/07/2023   CO2 24 02/07/2023    Lab Results  Component Value Date   ALT 64 (H) 02/07/2023   AST 102 (H) 02/07/2023   ALKPHOS 164 (H) 02/07/2023   BILITOT 1.1 02/07/2023     Microbiology: Recent Results (from the past 240 hour(s))  Remove and replace urinary cath (placed > 5 days) then obtain urine culture from new indwelling urinary catheter.     Status: Abnormal   Collection Time: 01/31/23  6:32 PM   Specimen: Urine, Catheterized  Result Value Ref Range Status   Specimen Description URINE, CATHETERIZED  Final   Special Requests   Final    NONE Performed at [facility], [address] [place],  [address]    Culture (A)  Final  70,000 COLONIES/mL PSEUDOMONAS AERUGINOSA 70,000 COLONIES/mL ENTEROBACTER AEROGENES PSEUDOMONAS AERUGINOSA Two isolates with different morphologies were identified as the same organism.The most resistant organism was reported.    Report Status 02/03/2023 FINAL  Final   Organism ID, Bacteria PSEUDOMONAS AERUGINOSA (A)  Final   Organism ID, Bacteria ENTEROBACTER AEROGENES (A)  Final      Susceptibility   Enterobacter aerogenes - MIC*    CEFEPIME <=0.12 SENSITIVE Sensitive     CEFTRIAXONE <=0.25 SENSITIVE Sensitive     CIPROFLOXACIN <=0.25 SENSITIVE Sensitive     GENTAMICIN <=1 SENSITIVE Sensitive     IMIPENEM 2 SENSITIVE Sensitive     NITROFURANTOIN 32 SENSITIVE Sensitive     TRIMETH/SULFA <=20 SENSITIVE Sensitive     PIP/TAZO <=4 SENSITIVE Sensitive     * 70,000 COLONIES/mL ENTEROBACTER AEROGENES   Pseudomonas aeruginosa - MIC*    CEFTAZIDIME 16 INTERMEDIATE Intermediate     CIPROFLOXACIN 1 INTERMEDIATE Intermediate     GENTAMICIN <=1 SENSITIVE Sensitive     IMIPENEM >=16 RESISTANT Resistant     * 70,000 COLONIES/mL PSEUDOMONAS AERUGINOSA    Janene Madeira, MSN, NP-C [email or web address] Pager: [phone] Office: [phone] [facility] Main Line: [place] Communication Welcome

## 2023-02-07 NOTE — Progress Notes (Signed)
PROGRESS NOTE  CHRYSTI TRAUM [id number] DOB: 12-13-1940 DOA: 11/29/2022 PCP: [name], MD   LOS: 70 days   Brief Narrative / Interim history: 82 year old female with history of hypertension who comes into the hospital and is admitted on 11/29/2022 due to decreased responsiveness, aphasia, right-sided weakness.  CT head on admission showed large IPH of left parietal/left temporal regions with extensions into basal ganglia, secondary dissection with IVH and surrounding vasogenic edema with regional mass effect.  She also had several episodes of vomiting with concern for aspiration.  Neurosurgery, neurology were consulted, she was intubated and admitted to neuro ICU.  She was started on 3% hypertonic saline and palliative care was consulted due to poor prognosis.  She eventually underwent tracheostomy as well as PEG placement, and was also started on Keppra due to concern for seizures.  She is currently on trach collar.  Multiple palliative care discussions have been ongoing with the family, at 1 point hospital leadership was involved, she remains full code/full scope.  Earliest she could be placed is a month out from her tracheostomy placement, 2/12.  Now on the difficult to place list.     Significant events: 12/21 - Presented to [facility] ED via EMS as Code Stroke. LKW 2000. Abrupt onset of decreased responsiveness, aphasia and R hemiplegia. Stroke admitting, NSGY consulted. PCCM consult for vent management. Treated with unasyn for aspiration pneumonia. CT Head with large L hemispheric parietal/temporal lobe IPH with extension into basal ganglia and IVH. HTS 3% started.   12/24 - Palliative care consulted, family requested meeting to be delayed 12/25 - Family requested goals of care meeting to be delayed 12/26 - Stopped Unasyn, replete phos; [name] GOC conversation: family says full code 12/27 - Restarted Unasyn for fever, RLL infiltrate; resp culture: enterobacter, MDR. Palliative consulted   12/28 - CT Head unchanged 12/29 - Unasyn changed to bactrim 12/31 - Posturing, CT head with increased shift, increased bleeding 1/2 - Family fired CVA service after discussion RE poor prognosis  1/4 - Off sedation  1/5 - No acute issues overnight. Remains on vent with increased respirations off sedation. Fever curve slightly decreased  1/6 - Pall care meeting moved to 2pm today. [name] the son-in-law will be present. On ven 30%/ On TF. Neo not started last night. MAP > 65 after fluids bolus. Febrile +.  Last CT head 12/09/22.  1/8 - No significant neurologic/clinical change. Brief GOC discussion with husband, [name], at bedside in AM. Concerns from staff re: patient GOC/decisionmaking, want to ensure husband is included in decisions/information sharing. Attempted bedside meeting but family prematurely ended meeting due to escalated discussion. 1/9 - Febrile overnight to 101.43F, remains on bromocriptine. No significant neurologic exam changes. 1/10 Family meeting yesterday with [name], plan for trach/peg though husband says they are still discussing this morning. Per Dt. Hulen Skains " So moving forward, [name] is the designated medical decision maker for Marina Delcarlo " 1/11 no new issues, plan for PEG tomorrow 1/12 for trach/peg today 1/15 No change in Neuro status , trach and PEG, No weaning , will repeat CT head without, get Case management involved in looking for LTAC ( Day 3 post trach on 1/12)   1/15-CT with some stabilization 1/19 No acute events overnight, respiratory culture 1/15 with abundant Pseudomonas, abundant Staphylococcus aureus, and moderate Enterobacter remains on Meropenem  1/22 trach collar trial 12 + hrs 1/24 trach collar x 24 hours on 35% 1/25- concern for seizures-- keppra started after EEG done 2/12- concern  for seizures-- EEG reordered, negative 2/19 -PCCM saw for trach maintenance 2/25: ? Aspiration event with PNA 2/28-worsening fever curves, requiring cooling  blanket, white count started to increase, ID consulted due to multiresistant Pseudomonas  Subjective / 24h Interval events: Worsening fever yesterday afternoon, required cooling blanket.  Unresponsive this morning  Assesement and Plan: Principal Problem:   ICH (intracerebral hemorrhage) (HCC) Active Problems:   RLS (restless legs syndrome)   Osteopenia   Sacroiliitis (HCC)   Benign essential HTN   Aspiration into airway   Hypoxia   Intraparenchymal hemorrhage of brain (HCC)   Brain compression (HCC)   Acute respiratory failure with hypoxia (HCC)   Pneumonia due to Enterobacter aerogenes (HCC)   Goals of care, counseling/discussion   Pressure injury of skin   Tracheostomy status ([place])   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/mass effect Brain compression/ICH Right parietal lobe hemorrhage with associated edema  Severe encephalopathy Intraventricular hemorrhage and mass effect, vasogenic edema, subfalcine herniation -Per prior team members, she has a very poor prognosis.  Multiple family meetings concluded over time with CMO on board, medical decision maker is [name] patient's son-in-law. Stroke team signed off. Maintain neuro protective measures. Nutrition and bowel regimen. Aspiration precautions   Fevers -worsening fever curve 2/28, worsening WBC.  Prior respiratory cultures showing Pseudomonas, most recent on 01/03/2023 showing Carbapenem resistance.  Most recent urine cultures also showing same highly resistant Pseudomonas, as well as pansensitive Enterobacter.  She is now requiring a cooling blanket overnight, has received Tylenol around-the-clock yesterday and was more febrile and with increasing WBC.  Discussed with Dr. Juleen China with ID, appreciate consult.  For now cover with Zerbaxa  Concern for seizures - On Keppra, underwent a repeat EEG on 2/12 and repeat neurology consulted, due to left sided rhythmic movements.  EEG did  not show any seizures.  Neurology signed off   Acute respiratory failure with hypoxia due to large intracerebral hemorrhage - S/p trach/PEG 1/12. Previously completed 10 days of antibiotic coverage for anterobasilar pneumonia, repeat sputum culture obtained 1/15 positive for abundant Pseudomonas, abundant Staphylococcus aureus, and moderate Enterobacter, s/p course of Meropenem -Continue ATC as tolerated, hope to avoid vent needs -PRN lasix -Wean O2 down as tolerated -Bronchial hygiene -Appreciate PCCM follow-up, last seen 2/26, she is being seen weekly   Urinary retention -foley placed 1/23, Will need urologic follow up as outpatient   History of chronic sacroiliitis Osteopenia RLS -Supportive care  Scheduled Meds:  Chlorhexidine Gluconate Cloth  6 each Topical Daily   famotidine  20 mg Per Tube QHS   feeding supplement (PROSource TF20)  60 mL Per Tube Daily   fiber supplement (BANATROL TF)  60 mL Per Tube BID   Gerhardt's butt cream   Topical QID   latanoprost  1 drop Both Eyes QHS   levETIRAcetam  500 mg Per Tube BID   metoprolol tartrate  12.5 mg Per Tube BID   nutrition supplement (JUVEN)  1 packet Per Tube BID BM   mouth rinse  15 mL Mouth Rinse Q2H   phenylephrine-shark liver oil-mineral oil-petrolatum  1 Application Rectal BID   rosuvastatin  20 mg Per Tube Daily   Continuous Infusions:  sodium chloride Stopped (01/01/23 0424)   ceftolozane-tazobactam (ZERBAXA) 1.5 g in sodium chloride 0.9 % 100 mL IVPB 111 mL/hr at 02/07/23 0600   feeding supplement (JEVITY 1.2 CAL) 55 mL/hr at 02/07/23 0600   PRN Meds:.acetaminophen **OR** acetaminophen (TYLENOL) oral liquid 160 mg/5 mL **OR** acetaminophen,  bisacodyl, docusate, glycopyrrolate, loperamide HCl, morphine injection, mouth rinse, polyethylene glycol  Current Outpatient Medications  Medication Instructions   latanoprost (XALATAN) 0.005 % ophthalmic solution 1 drop, Both Eyes, Daily at bedtime   oxyCODONE-acetaminophen  (PERCOCET/ROXICET) 5-325 MG tablet 1 tablet, Oral, Every 4 hours PRN   pramipexole (MIRAPEX) 0.25 mg, Oral, Nightly    Diet Orders (From admission, onward)     Start     Ordered   12/19/22 0001  Diet NPO time specified  Diet effective midnight        12/18/22 1632            DVT prophylaxis: SCD's Start: 11/29/22 2215   Lab Results  Component Value Date   PLT 247 02/07/2023      Code Status: Full Code  Family Communication: No family at bedside  Status is: Inpatient  Remains inpatient appropriate because: needs placement  Level of care: Progressive  Consultants:  Neurology Neurosurgery PCCM Palliative care  Objective: Vitals:   02/07/23 0500 02/07/23 0600 02/07/23 0727 02/07/23 0802  BP:  (!) 153/80 (!) 150/84   Pulse: (!) 105 (!) 105 (!) 112 (!) 115  Resp: (!) 24 (!) 22 (!) 28 (!) 24  Temp:   98.2 F (36.8 C)   TempSrc:   Oral   SpO2: 100% 100% 99% 99%  Weight: 63.8 kg     Height:        Intake/Output Summary (Last 24 hours) at 02/07/2023 0944 Last data filed at 02/07/2023 0600 Gross per 24 hour  Intake 3452.17 ml  Output 900 ml  Net 2552.17 ml    Wt Readings from Last 3 Encounters:  02/07/23 63.8 kg    Examination: Constitutional: NAD Eyes: lids and conjunctivae normal, no scleral icterus ENMT: mmm Neck: normal, supple Respiratory: clear to auscultation bilaterally, no wheezing, no crackles. Normal respiratory effort.  Cardiovascular: Regular rate and rhythm, no murmurs / rubs / gallops. No LE edema. Abdomen: soft, no distention, no tenderness. Bowel sounds positive.   Data Reviewed: I have independently reviewed following labs and imaging studies   CBC Recent Labs  Lab 02/02/23 0340 02/06/23 0654 02/07/23 0337  WBC 9.2 10.9* 12.7*  HGB 11.2* 10.9* 13.3  HCT 36.7 35.3* 43.7  PLT 338 267 247  MCV 96.8 96.2 98.0  MCH 29.6 29.7 29.8  MCHC 30.5 30.9 30.4  RDW 14.9 14.5 14.4     Recent Labs  Lab 02/06/23 0654 02/07/23 0337   NA 142 143  K 3.0* 4.7  CL 106 109  CO2 26 24  GLUCOSE 131* 115*  BUN 28* 30*  CREATININE 0.48 0.52  CALCIUM 8.2* 8.5*  AST  --  102*  ALT  --  64*  ALKPHOS  --  164*  BILITOT  --  1.1  ALBUMIN  --  2.4*  MG  --  2.2      ------------------------------------------------------------------------------------------------------------------ No results for input(s): "CHOL", "HDL", "LDLCALC", "TRIG", "CHOLHDL", "LDLDIRECT" in the last 72 hours.  Lab Results  Component Value Date   HGBA1C 5.5 12/01/2022   ------------------------------------------------------------------------------------------------------------------ No results for input(s): "TSH", "T4TOTAL", "T3FREE", "THYROIDAB" in the last 72 hours.  Invalid input(s): "FREET3"  Cardiac Enzymes No results for input(s): "CKMB", "TROPONINI", "MYOGLOBIN" in the last 168 hours.  Invalid input(s): "CK" ------------------------------------------------------------------------------------------------------------------ No results found for: "BNP"  CBG: Recent Labs  Lab 02/04/23 0757 02/05/23 0830 02/05/23 1157 02/05/23 1548 02/05/23 1911  GLUCAP 108* 152* 126* 160* 149*     Recent Results (from the past 240  hour(s))  Remove and replace urinary cath (placed > 5 days) then obtain urine culture from new indwelling urinary catheter.     Status: Abnormal   Collection Time: 01/31/23  6:32 PM   Specimen: Urine, Catheterized  Result Value Ref Range Status   Specimen Description URINE, CATHETERIZED  Final   Special Requests   Final    NONE Performed at [facility], [address], [place], [place] [address]    Culture (A)  Final    70,000 COLONIES/mL PSEUDOMONAS AERUGINOSA 70,000 COLONIES/mL ENTEROBACTER AEROGENES PSEUDOMONAS AERUGINOSA Two isolates with different morphologies were identified as the same organism.The most resistant organism was reported.    Report Status 02/03/2023 FINAL  Final   Organism ID,  Bacteria PSEUDOMONAS AERUGINOSA (A)  Final   Organism ID, Bacteria ENTEROBACTER AEROGENES (A)  Final      Susceptibility   Enterobacter aerogenes - MIC*    CEFEPIME <=0.12 SENSITIVE Sensitive     CEFTRIAXONE <=0.25 SENSITIVE Sensitive     CIPROFLOXACIN <=0.25 SENSITIVE Sensitive     GENTAMICIN <=1 SENSITIVE Sensitive     IMIPENEM 2 SENSITIVE Sensitive     NITROFURANTOIN 32 SENSITIVE Sensitive     TRIMETH/SULFA <=20 SENSITIVE Sensitive     PIP/TAZO <=4 SENSITIVE Sensitive     * 70,000 COLONIES/mL ENTEROBACTER AEROGENES   Pseudomonas aeruginosa - MIC*    CEFTAZIDIME 16 INTERMEDIATE Intermediate     CIPROFLOXACIN 1 INTERMEDIATE Intermediate     GENTAMICIN <=1 SENSITIVE Sensitive     IMIPENEM >=16 RESISTANT Resistant     * 70,000 COLONIES/mL PSEUDOMONAS AERUGINOSA      Radiology Studies: No results found.  Urijah Raynor M. Cruzita Lederer, MD, PhD [facility]  Between 7 am - 7 pm you can contact me via Amion (for emergencies) or [place] (non urgent matters).  I am not available 7 pm - 7 am, please contact night coverage MD/APP via Amion

## 2023-02-08 DIAGNOSIS — I612 Nontraumatic intracerebral hemorrhage in hemisphere, unspecified: Secondary | ICD-10-CM | POA: Diagnosis not present

## 2023-02-08 LAB — CBC
HCT: 35.3 % — ABNORMAL LOW (ref 36.0–46.0)
Hemoglobin: 11.1 g/dL — ABNORMAL LOW (ref 12.0–15.0)
MCH: 29.8 pg (ref 26.0–34.0)
MCHC: 31.4 g/dL (ref 30.0–36.0)
MCV: 94.6 fL (ref 80.0–100.0)
Platelets: 263 10*3/uL (ref 150–400)
RBC: 3.73 MIL/uL — ABNORMAL LOW (ref 3.87–5.11)
RDW: 14.3 % (ref 11.5–15.5)
WBC: 8 10*3/uL (ref 4.0–10.5)
nRBC: 0 % (ref 0.0–0.2)

## 2023-02-08 LAB — MAGNESIUM: Magnesium: 2.2 mg/dL (ref 1.7–2.4)

## 2023-02-08 LAB — COMPREHENSIVE METABOLIC PANEL
ALT: 57 U/L — ABNORMAL HIGH (ref 0–44)
AST: 57 U/L — ABNORMAL HIGH (ref 15–41)
Albumin: 2.1 g/dL — ABNORMAL LOW (ref 3.5–5.0)
Alkaline Phosphatase: 144 U/L — ABNORMAL HIGH (ref 38–126)
Anion gap: 5 (ref 5–15)
BUN: 37 mg/dL — ABNORMAL HIGH (ref 8–23)
CO2: 30 mmol/L (ref 22–32)
Calcium: 8.3 mg/dL — ABNORMAL LOW (ref 8.9–10.3)
Chloride: 107 mmol/L (ref 98–111)
Creatinine, Ser: 0.48 mg/dL (ref 0.44–1.00)
GFR, Estimated: >60 mL/min (ref >60)
Glucose, Bld: 113 mg/dL — ABNORMAL HIGH (ref 70–99)
Potassium: 2.9 mmol/L — ABNORMAL LOW (ref 3.5–5.1)
Sodium: 142 mmol/L (ref 135–145)
Total Bilirubin: 0.6 mg/dL (ref 0.3–1.2)
Total Protein: 6.3 g/dL — ABNORMAL LOW (ref 6.5–8.1)

## 2023-02-08 MED ORDER — POTASSIUM CHLORIDE 20 MEQ PO PACK
40.0000 meq | PACK | ORAL | Status: AC
  Administered 2023-02-08 (×2): 40 meq via ORAL
  Filled 2023-02-08 (×2): qty 2

## 2023-02-08 NOTE — Progress Notes (Signed)
Brief ID Note:  Noted improvement in fever curve and WBC on Zerbaxa.  Discussed with [facility] Dr Gloris Ham and will plan for 7 day empiric course.  Will sign off, please call as needed.   Raynelle Highland for Infectious Disease [facility] 02/08/2023, 11:15 AM

## 2023-02-08 NOTE — Procedures (Signed)
Tracheostomy Change Note  Patient Details:   Name: Pamela Johnston DOB: 1940-12-27 MRN: [id number]    Airway Documentation:     Evaluation  O2 sats: stable throughout Complications: No apparent complications Patient did tolerate procedure well. Bilateral Breath Sounds: Diminished  Routine trach change performed per MD order. #6 Shiley cuffless removed and new #6 Shiley cuffless placed by RT X 2 with no complications. Positive color change noted on ETCo2 detector. Vitals are stable and sats are 94%. RT will continue to monitor.     Aleksis Jiggetts Clyda Greener 02/08/2023, 11:59 AM

## 2023-02-08 NOTE — Progress Notes (Signed)
Pharmacy Antibiotic Note  LAKE ERNEY is a 82 y.o. female admitted on 11/29/2022 with pneumonia.  Pharmacy has been consulted for Zerbaxa dosing.  Plan: Continue Zerbaxa 1.5g Q8H.  7 day stop date placed per ID recommendations.  Monitor renal fxn for dose adjustments.   Height: '5\' 1"'$  (154.9 cm) Weight: 63.8 kg (140 lb 10.5 oz) IBW/kg (Calculated) : 47.8  Temp (24hrs), Avg:98.9 F (37.2 C), Min:97.9 F (36.6 C), Max:99.8 F (37.7 C)  Recent Labs  Lab 02/02/23 0340 02/06/23 0654 02/07/23 0337 02/08/23 0653  WBC 9.2 10.9* 12.7* 8.0  CREATININE  --  0.48 0.52 0.48    Estimated Creatinine Clearance: 48.8 mL/min (by C-G formula based on SCr of 0.48 mg/dL).    Allergies  Allergen Reactions   Nickel Other (See Comments)    Verified by Allergist   Penicillin G Other (See Comments)    Unknown reaction Tolerated Unasyn 11/2022.  TDD.    Thank you for allowing pharmacy to be a part of this patient's care.  Esmeralda Arthur, PharmD, BCCCP  02/08/2023 12:52 PM

## 2023-02-08 NOTE — Progress Notes (Signed)
[name] [id number] DOB: 1941-01-22 DOA: 11/29/2022 PCP: [name], MD   LOS: 59 days   Brief Narrative / Interim history: 82 year old female with history of hypertension who comes into the hospital and is admitted on 11/29/2022 due to decreased responsiveness, aphasia, right-sided weakness.  CT head on admission showed large IPH of left parietal/left temporal regions with extensions into basal ganglia, secondary dissection with IVH and surrounding vasogenic edema with regional mass effect.  She also had several episodes of vomiting with concern for aspiration.  Neurosurgery, neurology were consulted, she was intubated and admitted to neuro ICU.  She was started on 3% hypertonic saline and palliative care was consulted due to poor prognosis.  She eventually underwent tracheostomy as well as PEG placement, and was also started on Keppra due to concern for seizures.  She is currently on trach collar.  Multiple palliative care discussions have been ongoing with the family, at 1 point hospital leadership was involved, she remains full code/full scope.  Earliest she could be placed is a month out from her tracheostomy placement, 2/12.  Now on the difficult to place list.    Significant events: 12/21 - Presented to [facility] ED via EMS as Code Stroke. LKW 2000. Abrupt onset of decreased responsiveness, aphasia and R hemiplegia. Stroke admitting, NSGY consulted. PCCM consult for vent management. Treated with unasyn for aspiration pneumonia. CT Head with large L hemispheric parietal/temporal lobe IPH with extension into basal ganglia and IVH. HTS 3% started.   12/24 - Palliative care consulted, family requested meeting to be delayed 12/25 - Family requested goals of care meeting to be delayed 12/26 - Stopped Unasyn, replete phos; [name] GOC conversation: family says full code 12/27 - Restarted Unasyn for fever, RLL infiltrate; resp culture: enterobacter, MDR. Palliative consulted   12/28 - CT Head unchanged 12/29 - Unasyn changed to bactrim 12/31 - Posturing, CT head with increased shift, increased bleeding 1/2 - Family fired CVA service after discussion RE poor prognosis  1/4 - Off sedation  1/5 - No acute issues overnight. Remains on vent with increased respirations off sedation. Fever curve slightly decreased  1/6 - Pall care meeting moved to 2pm today. [name] the son-in-law will be present. On ven 30%/ On TF. Neo not started last night. MAP > 65 after fluids bolus. Febrile +.  Last CT head 12/09/22.  1/8 - No significant neurologic/clinical change. Brief GOC discussion with husband, Pamela Johnston, at bedside in AM. Concerns from staff re: patient GOC/decisionmaking, want to ensure husband is included in decisions/information sharing. Attempted bedside meeting but family prematurely ended meeting due to escalated discussion. 1/9 - Febrile overnight to 101.37F, remains on bromocriptine. No significant neurologic exam changes. 1/10 Family meeting yesterday with [name], plan for trach/peg though husband says they are still discussing this morning. Per Dt. Hulen Skains " So moving forward, Pamela Johnston is the designated medical decision maker for Pamela Johnston " 1/11 no new issues, plan for PEG tomorrow 1/12 for trach/peg today 1/15 No change in Neuro status , trach and PEG, No weaning , will repeat CT head without, get Case management involved in looking for LTAC ( Day 3 post trach on 1/12)   1/15-CT with some stabilization 1/19 No acute events overnight, respiratory culture 1/15 with abundant Pseudomonas, abundant Staphylococcus aureus, and moderate Enterobacter remains on Meropenem  1/22 trach collar trial 12 + hrs 1/24 trach collar x 24 hours on 35% 1/25- concern for seizures-- keppra started after EEG done 2/12- concern for  seizures-- EEG reordered, negative 2/19 -PCCM saw for trach maintenance 2/25: ? Aspiration event with PNA 2/28-worsening fever curves, requiring cooling  blanket, white count started to increase, ID consulted due to multiresistant Pseudomonas  Subjective / 24h Interval events: Fever curve improved. Unresponsive   Assesement and Plan: Principal Problem:   ICH (intracerebral hemorrhage) (HCC) Active Problems:   RLS (restless legs syndrome)   Osteopenia   Sacroiliitis (HCC)   Benign essential HTN   Aspiration into airway   Hypoxia   Intraparenchymal hemorrhage of brain (HCC)   Brain compression (HCC)   Acute respiratory failure with hypoxia (HCC)   Pneumonia due to Enterobacter aerogenes (HCC)   Goals of care, counseling/discussion   Pressure injury of skin   Tracheostomy status (HCC)   Fever   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/mass effect Brain compression/ICH Right parietal lobe hemorrhage with associated edema  Severe encephalopathy Intraventricular hemorrhage and mass effect, vasogenic edema, subfalcine herniation -Per prior team members, she has a very poor prognosis.  Multiple family meetings concluded over time with CMO on board, medical decision maker is Pamela Johnston patient's son-in-law. Stroke team signed off. Maintain neuro protective measures. Nutrition and bowel regimen. Aspiration precautions   Fevers -worsening fever curve 2/28, worsening WBC.  Prior respiratory cultures showing Pseudomonas, most recent on 01/03/2023 showing Carbapenem resistance.  Most recent urine cultures also showing same highly resistant Pseudomonas, as well as pansensitive Enterobacter.  She is now requiring a cooling blanket overnight, has received Tylenol around-the-clock yesterday and was more febrile and with increasing WBC.  Discussed with Dr. Juleen China with ID, appreciate consult.  For now cover with Zerbaxa, plan for total of 7 days  Concern for seizures - On Keppra, underwent a repeat EEG on 2/12 and repeat neurology consulted, due to left sided rhythmic movements.  EEG did not show any  seizures.  Neurology signed off   Acute respiratory failure with hypoxia due to large intracerebral hemorrhage - S/p trach/PEG 1/12. Previously completed 10 days of antibiotic coverage for anterobasilar pneumonia, repeat sputum culture obtained 1/15 positive for abundant Pseudomonas, abundant Staphylococcus aureus, and moderate Enterobacter, s/p course of Meropenem -Continue ATC as tolerated, hope to avoid vent needs -PRN lasix -Wean O2 down as tolerated -Bronchial hygiene -Appreciate PCCM follow-up, last seen 2/26, she is being seen weekly   Urinary retention -foley placed 1/23, Will need urologic follow up as outpatient   History of chronic sacroiliitis Osteopenia RLS -Supportive care  Scheduled Meds:  Chlorhexidine Gluconate Cloth  6 each Topical Daily   famotidine  20 mg Per Tube QHS   feeding supplement (PROSource TF20)  60 mL Per Tube Daily   fiber supplement (BANATROL TF)  60 mL Per Tube BID   Gerhardt's butt cream   Topical QID   latanoprost  1 drop Both Eyes QHS   levETIRAcetam  500 mg Per Tube BID   metoprolol tartrate  12.5 mg Per Tube BID   nutrition supplement (JUVEN)  1 packet Per Tube BID BM   mouth rinse  15 mL Mouth Rinse Q2H   phenylephrine-shark liver oil-mineral oil-petrolatum  1 Application Rectal BID   potassium chloride  40 mEq Oral Q3H   rosuvastatin  20 mg Per Tube Daily   Continuous Infusions:  sodium chloride Stopped (01/01/23 0424)   ceftolozane-tazobactam (ZERBAXA) 1.5 g in sodium chloride 0.9 % 100 mL IVPB 1.5 g (02/08/23 0510)   feeding supplement (JEVITY 1.2 CAL) 1,000 mL (02/08/23 0516)   PRN Meds:.acetaminophen **OR**  acetaminophen (TYLENOL) oral liquid 160 mg/5 mL **OR** acetaminophen, bisacodyl, docusate, glycopyrrolate, loperamide HCl, morphine injection, mouth rinse, polyethylene glycol  Current Outpatient Medications  Medication Instructions   latanoprost (XALATAN) 0.005 % ophthalmic solution 1 drop, Both Eyes, Daily at bedtime    oxyCODONE-acetaminophen (PERCOCET/ROXICET) 5-325 MG tablet 1 tablet, Oral, Every 4 hours PRN   pramipexole (MIRAPEX) 0.25 mg, Oral, Nightly    Diet Orders (From admission, onward)     Start     Ordered   12/19/22 0001  Diet NPO time specified  Diet effective midnight        12/18/22 1632            DVT prophylaxis: SCD's Start: 11/29/22 2215   Lab Results  Component Value Date   PLT 263 02/08/2023      Code Status: Full Code  Family Communication: No family at bedside  Status is: Inpatient  Remains inpatient appropriate because: needs placement  Level of care: Progressive  Consultants:  Neurology Neurosurgery PCCM Palliative care  Objective: Vitals:   02/08/23 0424 02/08/23 0759 02/08/23 0821 02/08/23 1000  BP:  121/63    Pulse: (!) 104 (!) 110 (!) 110 (!) 110  Resp: (!) 24 (!) 27 (!) 26 (!) 25  Temp:  99.5 F (37.5 C)    TempSrc:  Axillary    SpO2: 96% 94% 95% 91%  Weight:      Height:        Intake/Output Summary (Last 24 hours) at 02/08/2023 1122 Last data filed at 02/08/2023 0324 Gross per 24 hour  Intake --  Output 290 ml  Net -290 ml    Wt Readings from Last 3 Encounters:  02/07/23 63.8 kg    Examination: Constitutional: NAD Respiratory: CTA Cardiovascular: RRR  Data Reviewed: I have independently reviewed following labs and imaging studies   CBC Recent Labs  Lab 02/02/23 0340 02/06/23 0654 02/07/23 0337 02/08/23 0653  WBC 9.2 10.9* 12.7* 8.0  HGB 11.2* 10.9* 13.3 11.1*  HCT 36.7 35.3* 43.7 35.3*  PLT 338 267 247 263  MCV 96.8 96.2 98.0 94.6  MCH 29.6 29.7 29.8 29.8  MCHC 30.5 30.9 30.4 31.4  RDW 14.9 14.5 14.4 14.3     Recent Labs  Lab 02/06/23 0654 02/07/23 0337 02/08/23 0653  NA 142 143 142  K 3.0* 4.7 2.9*  CL 106 109 107  CO2 '26 24 30  '$ GLUCOSE 131* 115* 113*  BUN 28* 30* 37*  CREATININE 0.48 0.52 0.48  CALCIUM 8.2* 8.5* 8.3*  AST  --  102* 57*  ALT  --  64* 57*  ALKPHOS  --  164* 144*  BILITOT  --  1.1  0.6  ALBUMIN  --  2.4* 2.1*  MG  --  2.2 2.2      ------------------------------------------------------------------------------------------------------------------ No results for input(s): "CHOL", "HDL", "LDLCALC", "TRIG", "CHOLHDL", "LDLDIRECT" in the last 72 hours.  Lab Results  Component Value Date   HGBA1C 5.5 12/01/2022   ------------------------------------------------------------------------------------------------------------------ No results for input(s): "TSH", "T4TOTAL", "T3FREE", "THYROIDAB" in the last 72 hours.  Invalid input(s): "FREET3"  Cardiac Enzymes No results for input(s): "CKMB", "TROPONINI", "MYOGLOBIN" in the last 168 hours.  Invalid input(s): "CK" ------------------------------------------------------------------------------------------------------------------ No results found for: "BNP"  CBG: Recent Labs  Lab 02/04/23 0757 02/05/23 0830 02/05/23 1157 02/05/23 1548 02/05/23 1911  GLUCAP 108* 152* 126* 160* 149*     Recent Results (from the past 240 hour(s))  Remove and replace urinary cath (placed > 5 days) then obtain urine culture  from new indwelling urinary catheter.     Status: Abnormal   Collection Time: 01/31/23  6:32 PM   Specimen: Urine, Catheterized  Result Value Ref Range Status   Specimen Description URINE, CATHETERIZED  Final   Special Requests   Final    NONE Performed at [facility], [address] [place], [place] [address]    Culture (A)  Final    70,000 COLONIES/mL PSEUDOMONAS AERUGINOSA 70,000 COLONIES/mL ENTEROBACTER AEROGENES PSEUDOMONAS AERUGINOSA Two isolates with different morphologies were identified as the same organism.The most resistant organism was reported.    Report Status 02/03/2023 FINAL  Final   Organism ID, Bacteria PSEUDOMONAS AERUGINOSA (A)  Final   Organism ID, Bacteria ENTEROBACTER AEROGENES (A)  Final      Susceptibility   Enterobacter aerogenes - MIC*    CEFEPIME <=0.12 SENSITIVE  Sensitive     CEFTRIAXONE <=0.25 SENSITIVE Sensitive     CIPROFLOXACIN <=0.25 SENSITIVE Sensitive     GENTAMICIN <=1 SENSITIVE Sensitive     IMIPENEM 2 SENSITIVE Sensitive     NITROFURANTOIN 32 SENSITIVE Sensitive     TRIMETH/SULFA <=20 SENSITIVE Sensitive     PIP/TAZO <=4 SENSITIVE Sensitive     * 70,000 COLONIES/mL ENTEROBACTER AEROGENES   Pseudomonas aeruginosa - MIC*    CEFTAZIDIME 16 INTERMEDIATE Intermediate     CIPROFLOXACIN 1 INTERMEDIATE Intermediate     GENTAMICIN <=1 SENSITIVE Sensitive     IMIPENEM >=16 RESISTANT Resistant     * 70,000 COLONIES/mL PSEUDOMONAS AERUGINOSA      Radiology Studies: No results found.  Terrilynn Postell M. Cruzita Lederer, MD, PhD [facility]  Between 7 am - 7 pm you can contact me via Amion (for emergencies) or [place] (non urgent matters).  I am not available 7 pm - 7 am, please contact night coverage MD/APP via Amion

## 2023-02-09 DIAGNOSIS — I612 Nontraumatic intracerebral hemorrhage in hemisphere, unspecified: Secondary | ICD-10-CM | POA: Diagnosis not present

## 2023-02-09 LAB — CBC
HCT: 32.7 % — ABNORMAL LOW (ref 36.0–46.0)
Hemoglobin: 10.1 g/dL — ABNORMAL LOW (ref 12.0–15.0)
MCH: 29.9 pg (ref 26.0–34.0)
MCHC: 30.9 g/dL (ref 30.0–36.0)
MCV: 96.7 fL (ref 80.0–100.0)
Platelets: 221 10*3/uL (ref 150–400)
RBC: 3.38 MIL/uL — ABNORMAL LOW (ref 3.87–5.11)
RDW: 14.6 % (ref 11.5–15.5)
WBC: 8.3 10*3/uL (ref 4.0–10.5)
nRBC: 0 % (ref 0.0–0.2)

## 2023-02-09 LAB — COMPREHENSIVE METABOLIC PANEL
ALT: 48 U/L — ABNORMAL HIGH (ref 0–44)
AST: 46 U/L — ABNORMAL HIGH (ref 15–41)
Albumin: 2.1 g/dL — ABNORMAL LOW (ref 3.5–5.0)
Alkaline Phosphatase: 143 U/L — ABNORMAL HIGH (ref 38–126)
Anion gap: 8 (ref 5–15)
BUN: 36 mg/dL — ABNORMAL HIGH (ref 8–23)
CO2: 24 mmol/L (ref 22–32)
Calcium: 8.2 mg/dL — ABNORMAL LOW (ref 8.9–10.3)
Chloride: 114 mmol/L — ABNORMAL HIGH (ref 98–111)
Creatinine, Ser: 0.59 mg/dL (ref 0.44–1.00)
GFR, Estimated: >60 mL/min (ref >60)
Glucose, Bld: 120 mg/dL — ABNORMAL HIGH (ref 70–99)
Potassium: 3.7 mmol/L (ref 3.5–5.1)
Sodium: 146 mmol/L — ABNORMAL HIGH (ref 135–145)
Total Bilirubin: 0.3 mg/dL (ref 0.3–1.2)
Total Protein: 6 g/dL — ABNORMAL LOW (ref 6.5–8.1)

## 2023-02-09 LAB — MAGNESIUM: Magnesium: 2.3 mg/dL (ref 1.7–2.4)

## 2023-02-09 NOTE — Progress Notes (Signed)
[name] [id number] DOB: 1941-11-05 DOA: 11/29/2022 PCP: [name], MD   LOS: 72 days   Brief Narrative / Interim history: 82 year old female with history of hypertension who comes into the hospital and is admitted on 11/29/2022 due to decreased responsiveness, aphasia, right-sided weakness.  CT head on admission showed large IPH of left parietal/left temporal regions with extensions into basal ganglia, secondary dissection with IVH and surrounding vasogenic edema with regional mass effect.  She also had several episodes of vomiting with concern for aspiration.  Neurosurgery, neurology were consulted, she was intubated and admitted to neuro ICU.  She was started on 3% hypertonic saline and palliative care was consulted due to poor prognosis.  She eventually underwent tracheostomy as well as PEG placement, and was also started on Keppra due to concern for seizures.  She is currently on trach collar.  Multiple palliative care discussions have been ongoing with the family, at 1 point hospital leadership was involved, she remains full code/full scope.  Earliest she could be placed is a month out from her tracheostomy placement, 2/12.  Now on the difficult to place list.    Significant events: 12/21 - Presented to [facility] ED via EMS as Code Stroke. LKW 2000. Abrupt onset of decreased responsiveness, aphasia and R hemiplegia. Stroke admitting, NSGY consulted. PCCM consult for vent management. Treated with unasyn for aspiration pneumonia. CT Head with large L hemispheric parietal/temporal lobe IPH with extension into basal ganglia and IVH. HTS 3% started.   12/24 - Palliative care consulted, family requested meeting to be delayed 12/25 - Family requested goals of care meeting to be delayed 12/26 - Stopped Unasyn, replete phos; [name] GOC conversation: family says full code 12/27 - Restarted Unasyn for fever, RLL infiltrate; resp culture: enterobacter, MDR. Palliative consulted   12/28 - CT Head unchanged 12/29 - Unasyn changed to bactrim 12/31 - Posturing, CT head with increased shift, increased bleeding 1/2 - Family fired CVA service after discussion RE poor prognosis  1/4 - Off sedation  1/5 - No acute issues overnight. Remains on vent with increased respirations off sedation. Fever curve slightly decreased  1/6 - Pall care meeting moved to 2pm today. [name] the son-in-law will be present. On ven 30%/ On TF. Neo not started last night. MAP > 65 after fluids bolus. Febrile +.  Last CT head 12/09/22.  1/8 - No significant neurologic/clinical change. Brief GOC discussion with husband, Pamela Johnston, at bedside in AM. Concerns from staff re: patient GOC/decisionmaking, want to ensure husband is included in decisions/information sharing. Attempted bedside meeting but family prematurely ended meeting due to escalated discussion. 1/9 - Febrile overnight to 101.23F, remains on bromocriptine. No significant neurologic exam changes. 1/10 Family meeting yesterday with [name], plan for trach/peg though husband says they are still discussing this morning. Per Dt. Hulen Skains " So moving forward, Pamela Johnston is the designated medical decision maker for Pamela Johnston " 1/11 no new issues, plan for PEG tomorrow 1/12 for trach/peg today 1/15 No change in Neuro status , trach and PEG, No weaning , will repeat CT head without, get Case management involved in looking for LTAC ( Day 3 post trach on 1/12)   1/15-CT with some stabilization 1/19 No acute events overnight, respiratory culture 1/15 with abundant Pseudomonas, abundant Staphylococcus aureus, and moderate Enterobacter remains on Meropenem  1/22 trach collar trial 12 + hrs 1/24 trach collar x 24 hours on 35% 1/25- concern for seizures-- keppra started after EEG done 2/12- concern for  seizures-- EEG reordered, negative 2/19 -PCCM saw for trach maintenance 2/25: ? Aspiration event with PNA 2/28-worsening fever curves, requiring cooling  blanket, white count started to increase, ID consulted due to multiresistant Pseudomonas.  Started on 7-day of Zerbaxa  Subjective / 24h Interval events: Fever curve improved, however remains unresponsive  Assesement and Plan: Principal Problem:   ICH (intracerebral hemorrhage) (HCC) Active Problems:   RLS (restless legs syndrome)   Osteopenia   Sacroiliitis (HCC)   Benign essential HTN   Aspiration into airway   Hypoxia   Intraparenchymal hemorrhage of brain (HCC)   Brain compression (HCC)   Acute respiratory failure with hypoxia (HCC)   Pneumonia due to Enterobacter aerogenes (HCC)   Goals of care, counseling/discussion   Pressure injury of skin   Tracheostomy status (HCC)   Fever   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/mass effect Brain compression/ICH Right parietal lobe hemorrhage with associated edema  Severe encephalopathy Intraventricular hemorrhage and mass effect, vasogenic edema, subfalcine herniation -Per prior team members, she has a very poor prognosis.  Multiple family meetings concluded over time with CMO on board, medical decision maker is Pamela Johnston patient's son-in-law. Stroke team signed off. Maintain neuro protective measures. Nutrition and bowel regimen. Aspiration precautions   Fevers -worsening fever curve 2/28, worsening WBC.  Prior respiratory cultures showing Pseudomonas, most recent on 01/03/2023 showing Carbapenem resistance.  Most recent urine cultures also showing same highly resistant Pseudomonas, as well as pansensitive Enterobacter.  She is now requiring a cooling blanket overnight, has received Tylenol around-the-clock yesterday and was more febrile and with increasing WBC.  Discussed with Dr. Juleen China with ID, appreciate consult.  For now cover with Zerbaxa, plan for total of 7 days  Concern for seizures - On Keppra, underwent a repeat EEG on 2/12 and repeat neurology consulted, due to left sided  rhythmic movements.  EEG did not show any seizures.  Neurology signed off   Acute respiratory failure with hypoxia due to large intracerebral hemorrhage - S/p trach/PEG 1/12. Previously completed 10 days of antibiotic coverage for anterobasilar pneumonia, repeat sputum culture obtained 1/15 positive for abundant Pseudomonas, abundant Staphylococcus aureus, and moderate Enterobacter, s/p course of Meropenem -Continue ATC as tolerated, hope to avoid vent needs -PRN lasix -Wean O2 down as tolerated -Bronchial hygiene -Appreciate PCCM follow-up, last seen 2/26, she is being seen weekly   Urinary retention -foley placed 1/23, Will need urologic follow up as outpatient   History of chronic sacroiliitis Osteopenia RLS -Supportive care  Scheduled Meds:  Chlorhexidine Gluconate Cloth  6 each Topical Daily   famotidine  20 mg Per Tube QHS   feeding supplement (PROSource TF20)  60 mL Per Tube Daily   fiber supplement (BANATROL TF)  60 mL Per Tube BID   Gerhardt's butt cream   Topical QID   latanoprost  1 drop Both Eyes QHS   levETIRAcetam  500 mg Per Tube BID   metoprolol tartrate  12.5 mg Per Tube BID   nutrition supplement (JUVEN)  1 packet Per Tube BID BM   mouth rinse  15 mL Mouth Rinse Q2H   phenylephrine-shark liver oil-mineral oil-petrolatum  1 Application Rectal BID   rosuvastatin  20 mg Per Tube Daily   Continuous Infusions:  sodium chloride Stopped (01/01/23 0424)   ceftolozane-tazobactam (ZERBAXA) 1.5 g in sodium chloride 0.9 % 100 mL IVPB 1.5 g (02/09/23 0730)   feeding supplement (JEVITY 1.2 CAL) 1,000 mL (02/09/23 0522)   PRN Meds:.acetaminophen **OR** acetaminophen (TYLENOL)  oral liquid 160 mg/5 mL **OR** acetaminophen, bisacodyl, docusate, glycopyrrolate, loperamide HCl, morphine injection, mouth rinse, polyethylene glycol  Current Outpatient Medications  Medication Instructions   latanoprost (XALATAN) 0.005 % ophthalmic solution 1 drop, Both Eyes, Daily at bedtime    oxyCODONE-acetaminophen (PERCOCET/ROXICET) 5-325 MG tablet 1 tablet, Oral, Every 4 hours PRN   pramipexole (MIRAPEX) 0.25 mg, Oral, Nightly    Diet Orders (From admission, onward)     Start     Ordered   12/19/22 0001  Diet NPO time specified  Diet effective midnight        12/18/22 1632            DVT prophylaxis: SCD's Start: 11/29/22 2215   Lab Results  Component Value Date   PLT 221 02/09/2023      Code Status: Full Code  Family Communication: No family at bedside  Status is: Inpatient  Remains inpatient appropriate because: needs placement  Level of care: Progressive  Consultants:  Neurology Neurosurgery PCCM Palliative care  Objective: Vitals:   02/09/23 0347 02/09/23 0405 02/09/23 0720 02/09/23 0804  BP: 132/62  128/66   Pulse: (!) 109  (!) 105   Resp: (!) 29  (!) 30   Temp: 99.9 F (37.7 C)  98.9 F (37.2 C)   TempSrc: Axillary  Axillary   SpO2: 94% 94% 94%   Weight:    43 kg  Height:        Intake/Output Summary (Last 24 hours) at 02/09/2023 1043 Last data filed at 02/09/2023 0349 Gross per 24 hour  Intake --  Output 1800 ml  Net -1800 ml    Wt Readings from Last 3 Encounters:  02/09/23 43 kg    Examination: Constitutional: NAD Respiratory: CTA Cardiovascular: RRR  Data Reviewed: I have independently reviewed following labs and imaging studies   CBC Recent Labs  Lab 02/06/23 0654 02/07/23 0337 02/08/23 0653 02/09/23 0807  WBC 10.9* 12.7* 8.0 8.3  HGB 10.9* 13.3 11.1* 10.1*  HCT 35.3* 43.7 35.3* 32.7*  PLT 267 247 263 221  MCV 96.2 98.0 94.6 96.7  MCH 29.7 29.8 29.8 29.9  MCHC 30.9 30.4 31.4 30.9  RDW 14.5 14.4 14.3 14.6     Recent Labs  Lab 02/06/23 0654 02/07/23 0337 02/08/23 0653 02/09/23 0807  NA 142 143 142 146*  K 3.0* 4.7 2.9* 3.7  CL 106 109 107 114*  CO2 '26 24 30 24  '$ GLUCOSE 131* 115* 113* 120*  BUN 28* 30* 37* 36*  CREATININE 0.48 0.52 0.48 0.59  CALCIUM 8.2* 8.5* 8.3* 8.2*  AST  --  102* 57* 46*   ALT  --  64* 57* 48*  ALKPHOS  --  164* 144* 143*  BILITOT  --  1.1 0.6 0.3  ALBUMIN  --  2.4* 2.1* 2.1*  MG  --  2.2 2.2 2.3      ------------------------------------------------------------------------------------------------------------------ No results for input(s): "CHOL", "HDL", "LDLCALC", "TRIG", "CHOLHDL", "LDLDIRECT" in the last 72 hours.  Lab Results  Component Value Date   HGBA1C 5.5 12/01/2022   ------------------------------------------------------------------------------------------------------------------ No results for input(s): "TSH", "T4TOTAL", "T3FREE", "THYROIDAB" in the last 72 hours.  Invalid input(s): "FREET3"  Cardiac Enzymes No results for input(s): "CKMB", "TROPONINI", "MYOGLOBIN" in the last 168 hours.  Invalid input(s): "CK" ------------------------------------------------------------------------------------------------------------------ No results found for: "BNP"  CBG: Recent Labs  Lab 02/04/23 0757 02/05/23 0830 02/05/23 1157 02/05/23 1548 02/05/23 1911  GLUCAP 108* 152* 126* 160* 149*     Recent Results (from the past 240 hour(s))  Remove and replace urinary cath (placed > 5 days) then obtain urine culture from new indwelling urinary catheter.     Status: Abnormal   Collection Time: 01/31/23  6:32 PM   Specimen: Urine, Catheterized  Result Value Ref Range Status   Specimen Description URINE, CATHETERIZED  Final   Special Requests   Final    NONE Performed at [facility], [address] [place], [place] [id number]    Culture (A)  Final    70,000 COLONIES/mL PSEUDOMONAS AERUGINOSA 70,000 COLONIES/mL ENTEROBACTER AEROGENES PSEUDOMONAS AERUGINOSA Two isolates with different morphologies were identified as the same organism.The most resistant organism was reported.    Report Status 02/03/2023 FINAL  Final   Organism ID, Bacteria PSEUDOMONAS AERUGINOSA (A)  Final   Organism ID, Bacteria ENTEROBACTER AEROGENES (A)  Final       Susceptibility   Enterobacter aerogenes - MIC*    CEFEPIME <=0.12 SENSITIVE Sensitive     CEFTRIAXONE <=0.25 SENSITIVE Sensitive     CIPROFLOXACIN <=0.25 SENSITIVE Sensitive     GENTAMICIN <=1 SENSITIVE Sensitive     IMIPENEM 2 SENSITIVE Sensitive     NITROFURANTOIN 32 SENSITIVE Sensitive     TRIMETH/SULFA <=20 SENSITIVE Sensitive     PIP/TAZO <=4 SENSITIVE Sensitive     * 70,000 COLONIES/mL ENTEROBACTER AEROGENES   Pseudomonas aeruginosa - MIC*    CEFTAZIDIME 16 INTERMEDIATE Intermediate     CIPROFLOXACIN 1 INTERMEDIATE Intermediate     GENTAMICIN <=1 SENSITIVE Sensitive     IMIPENEM >=16 RESISTANT Resistant     * 70,000 COLONIES/mL PSEUDOMONAS AERUGINOSA      Radiology Studies: No results found.  Natajah Derderian M. Cruzita Lederer, MD, PhD [facility]  Between 7 am - 7 pm you can contact me via Amion (for emergencies) or [place] (non urgent matters).  I am not available 7 pm - 7 am, please contact night coverage MD/APP via Amion

## 2023-02-10 DIAGNOSIS — I612 Nontraumatic intracerebral hemorrhage in hemisphere, unspecified: Secondary | ICD-10-CM | POA: Diagnosis not present

## 2023-02-10 NOTE — Progress Notes (Signed)
PROGRESS NOTE  Pamela Johnston [id number] DOB: 28-May-1941 DOA: 11/29/2022 PCP: [name], MD   LOS: 66 days   Brief Narrative / Interim history: 82 year old female with history of hypertension who comes into the hospital and is admitted on 11/29/2022 due to decreased responsiveness, aphasia, right-sided weakness.  CT head on admission showed large IPH of left parietal/left temporal regions with extensions into basal ganglia, secondary dissection with IVH and surrounding vasogenic edema with regional mass effect.  She also had several episodes of vomiting with concern for aspiration.  Neurosurgery, neurology were consulted, she was intubated and admitted to neuro ICU.  She was started on 3% hypertonic saline and palliative care was consulted due to poor prognosis.  She eventually underwent tracheostomy as well as PEG placement, and was also started on Keppra due to concern for seizures.  She is currently on trach collar.  Multiple palliative care discussions have been ongoing with the family, at 1 point hospital leadership was involved, she remains full code/full scope.  Earliest she could be placed is a month out from her tracheostomy placement, 2/12.  Now on the difficult to place list.    Significant events: 12/21 - Presented to [facility] ED via EMS as Code Stroke. LKW 2000. Abrupt onset of decreased responsiveness, aphasia and R hemiplegia. Stroke admitting, NSGY consulted. PCCM consult for vent management. Treated with unasyn for aspiration pneumonia. CT Head with large L hemispheric parietal/temporal lobe IPH with extension into basal ganglia and IVH. HTS 3% started.   12/24 - Palliative care consulted, family requested meeting to be delayed 12/25 - Family requested goals of care meeting to be delayed 12/26 - Stopped Unasyn, replete phos; [name] GOC conversation: family says full code 12/27 - Restarted Unasyn for fever, RLL infiltrate; resp culture: enterobacter, MDR. Palliative consulted   12/28 - CT Head unchanged 12/29 - Unasyn changed to bactrim 12/31 - Posturing, CT head with increased shift, increased bleeding 1/2 - Family fired CVA service after discussion RE poor prognosis  1/4 - Off sedation  1/5 - No acute issues overnight. Remains on vent with increased respirations off sedation. Fever curve slightly decreased  1/6 - Pall care meeting moved to 2pm today. [name] the son-in-law will be present. On ven 30%/ On TF. Neo not started last night. MAP > 65 after fluids bolus. Febrile +.  Last CT head 12/09/22.  1/8 - No significant neurologic/clinical change. Brief GOC discussion with husband, [name], at bedside in AM. Concerns from staff re: patient GOC/decisionmaking, want to ensure husband is included in decisions/information sharing. Attempted bedside meeting but family prematurely ended meeting due to escalated discussion. 1/9 - Febrile overnight to 101.28F, remains on bromocriptine. No significant neurologic exam changes. 1/10 Family meeting yesterday with [name], plan for trach/peg though husband says they are still discussing this morning. Per Dt. Hulen Skains " So moving forward, [name] is the designated medical decision maker for Leighton Mexicano " 1/11 no new issues, plan for PEG tomorrow 1/12 for trach/peg today 1/15 No change in Neuro status , trach and PEG, No weaning , will repeat CT head without, get Case management involved in looking for LTAC ( Day 3 post trach on 1/12)   1/15-CT with some stabilization 1/19 No acute events overnight, respiratory culture 1/15 with abundant Pseudomonas, abundant Staphylococcus aureus, and moderate Enterobacter remains on Meropenem  1/22 trach collar trial 12 + hrs 1/24 trach collar x 24 hours on 35% 1/25- concern for seizures-- keppra started after EEG done 2/12- concern for  seizures-- EEG reordered, negative 2/19 -PCCM saw for trach maintenance 2/25: ? Aspiration event with PNA 2/28-worsening fever curves, requiring cooling  blanket, white count started to increase, ID consulted due to multiresistant Pseudomonas.  Started on 7-day of Zerbaxa  Subjective / 24h Interval events: Fever curve improved.  No overnight events.  Remains unresponsive  Assesement and Plan: Principal Problem:   ICH (intracerebral hemorrhage) (HCC) Active Problems:   RLS (restless legs syndrome)   Osteopenia   Sacroiliitis (HCC)   Benign essential HTN   Aspiration into airway   Hypoxia   Intraparenchymal hemorrhage of brain (HCC)   Brain compression (HCC)   Acute respiratory failure with hypoxia (HCC)   Pneumonia due to Enterobacter aerogenes (HCC)   Goals of care, counseling/discussion   Pressure injury of skin   Tracheostomy status (HCC)   Fever   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/mass effect Brain compression/ICH Right parietal lobe hemorrhage with associated edema  Severe encephalopathy Intraventricular hemorrhage and mass effect, vasogenic edema, subfalcine herniation -Per prior team members, she has a very poor prognosis.  Multiple family meetings concluded over time with CMO on board, medical decision maker is [name] patient's son-in-law. Stroke team signed off. Maintain neuro protective measures. Nutrition and bowel regimen. Aspiration precautions   Fevers -worsening fever curve 2/28, worsening WBC.  Prior respiratory cultures showing Pseudomonas, most recent on 01/03/2023 showing Carbapenem resistance.  Most recent urine cultures also showing same highly resistant Pseudomonas, as well as pansensitive Enterobacter.  She is now requiring a cooling blanket overnight, has received Tylenol around-the-clock yesterday and was more febrile and with increasing WBC.  Discussed with Dr. Juleen China with ID, appreciate consult.  For now cover with Zerbaxa, plan for total of 7 days  Concern for seizures - On Keppra, underwent a repeat EEG on 2/12 and repeat neurology consulted, due to  left sided rhythmic movements.  EEG did not show any seizures.  Neurology signed off   Acute respiratory failure with hypoxia due to large intracerebral hemorrhage - S/p trach/PEG 1/12. Previously completed 10 days of antibiotic coverage for anterobasilar pneumonia, repeat sputum culture obtained 1/15 positive for abundant Pseudomonas, abundant Staphylococcus aureus, and moderate Enterobacter, s/p course of Meropenem -Continue ATC as tolerated, hope to avoid vent needs -PRN lasix -Wean O2 down as tolerated -Bronchial hygiene -Appreciate PCCM follow-up, she is being seen weekly   Urinary retention -foley placed 1/23, Will need urologic follow up as outpatient   History of chronic sacroiliitis Osteopenia RLS -Supportive care  Scheduled Meds:  Chlorhexidine Gluconate Cloth  6 each Topical Daily   famotidine  20 mg Per Tube QHS   feeding supplement (PROSource TF20)  60 mL Per Tube Daily   fiber supplement (BANATROL TF)  60 mL Per Tube BID   Gerhardt's butt cream   Topical QID   latanoprost  1 drop Both Eyes QHS   levETIRAcetam  500 mg Per Tube BID   metoprolol tartrate  12.5 mg Per Tube BID   nutrition supplement (JUVEN)  1 packet Per Tube BID BM   mouth rinse  15 mL Mouth Rinse Q2H   phenylephrine-shark liver oil-mineral oil-petrolatum  1 Application Rectal BID   rosuvastatin  20 mg Per Tube Daily   Continuous Infusions:  sodium chloride Stopped (01/01/23 0424)   ceftolozane-tazobactam (ZERBAXA) 1.5 g in sodium chloride 0.9 % 100 mL IVPB 1.5 g (02/10/23 0525)   feeding supplement (JEVITY 1.2 CAL) 1,000 mL (02/10/23 0047)   PRN Meds:.acetaminophen **OR** acetaminophen (  TYLENOL) oral liquid 160 mg/5 mL **OR** acetaminophen, bisacodyl, docusate, glycopyrrolate, loperamide HCl, morphine injection, mouth rinse, polyethylene glycol  Current Outpatient Medications  Medication Instructions   latanoprost (XALATAN) 0.005 % ophthalmic solution 1 drop, Both Eyes, Daily at bedtime    oxyCODONE-acetaminophen (PERCOCET/ROXICET) 5-325 MG tablet 1 tablet, Oral, Every 4 hours PRN   pramipexole (MIRAPEX) 0.25 mg, Oral, Nightly    Diet Orders (From admission, onward)     Start     Ordered   12/19/22 0001  Diet NPO time specified  Diet effective midnight        12/18/22 1632            DVT prophylaxis: SCD's Start: 11/29/22 2215   Lab Results  Component Value Date   PLT 221 02/09/2023      Code Status: Full Code  Family Communication: No family at bedside  Status is: Inpatient  Remains inpatient appropriate because: needs placement  Level of care: Progressive  Consultants:  Neurology Neurosurgery PCCM Palliative care  Objective: Vitals:   02/10/23 0339 02/10/23 0346 02/10/23 0703 02/10/23 0715  BP: 138/65  130/65   Pulse: 87 86 95   Resp: (!) 26 (!) 22 (!) 25 (!) 21  Temp: 99.2 F (37.3 C)  99.3 F (37.4 C)   TempSrc: Axillary  Axillary   SpO2: 97% 96% 95%   Weight:      Height:        Intake/Output Summary (Last 24 hours) at 02/10/2023 0743 Last data filed at 02/10/2023 [id number] Gross per 24 hour  Intake 4601.03 ml  Output 2450 ml  Net 2151.03 ml    Wt Readings from Last 3 Encounters:  02/09/23 43 kg    Examination: Constitutional: NAD Respiratory: CTA Cardiovascular: RRR  Data Reviewed: I have independently reviewed following labs and imaging studies   CBC Recent Labs  Lab 02/06/23 0654 02/07/23 0337 02/08/23 0653 02/09/23 0807  WBC 10.9* 12.7* 8.0 8.3  HGB 10.9* 13.3 11.1* 10.1*  HCT 35.3* 43.7 35.3* 32.7*  PLT 267 247 263 221  MCV 96.2 98.0 94.6 96.7  MCH 29.7 29.8 29.8 29.9  MCHC 30.9 30.4 31.4 30.9  RDW 14.5 14.4 14.3 14.6     Recent Labs  Lab 02/06/23 0654 02/07/23 0337 02/08/23 0653 02/09/23 0807  NA 142 143 142 146*  K 3.0* 4.7 2.9* 3.7  CL 106 109 107 114*  CO2 '26 24 30 24  '$ GLUCOSE 131* 115* 113* 120*  BUN 28* 30* 37* 36*  CREATININE 0.48 0.52 0.48 0.59  CALCIUM 8.2* 8.5* 8.3* 8.2*  AST  --  102*  57* 46*  ALT  --  64* 57* 48*  ALKPHOS  --  164* 144* 143*  BILITOT  --  1.1 0.6 0.3  ALBUMIN  --  2.4* 2.1* 2.1*  MG  --  2.2 2.2 2.3      ------------------------------------------------------------------------------------------------------------------ No results for input(s): "CHOL", "HDL", "LDLCALC", "TRIG", "CHOLHDL", "LDLDIRECT" in the last 72 hours.  Lab Results  Component Value Date   HGBA1C 5.5 12/01/2022   ------------------------------------------------------------------------------------------------------------------ No results for input(s): "TSH", "T4TOTAL", "T3FREE", "THYROIDAB" in the last 72 hours.  Invalid input(s): "FREET3"  Cardiac Enzymes No results for input(s): "CKMB", "TROPONINI", "MYOGLOBIN" in the last 168 hours.  Invalid input(s): "CK" ------------------------------------------------------------------------------------------------------------------ No results found for: "BNP"  CBG: Recent Labs  Lab 02/04/23 0757 02/05/23 0830 02/05/23 1157 02/05/23 1548 02/05/23 1911  GLUCAP 108* 152* 126* 160* 149*     Recent Results (from the past 240  hour(s))  Remove and replace urinary cath (placed > 5 days) then obtain urine culture from new indwelling urinary catheter.     Status: Abnormal   Collection Time: 01/31/23  6:32 PM   Specimen: Urine, Catheterized  Result Value Ref Range Status   Specimen Description URINE, CATHETERIZED  Final   Special Requests   Final    NONE Performed at [facility], [address] [place], [place] [address]    Culture (A)  Final    70,000 COLONIES/mL PSEUDOMONAS AERUGINOSA 70,000 COLONIES/mL ENTEROBACTER AEROGENES PSEUDOMONAS AERUGINOSA Two isolates with different morphologies were identified as the same organism.The most resistant organism was reported.    Report Status 02/03/2023 FINAL  Final   Organism ID, Bacteria PSEUDOMONAS AERUGINOSA (A)  Final   Organism ID, Bacteria ENTEROBACTER AEROGENES (A)   Final      Susceptibility   Enterobacter aerogenes - MIC*    CEFEPIME <=0.12 SENSITIVE Sensitive     CEFTRIAXONE <=0.25 SENSITIVE Sensitive     CIPROFLOXACIN <=0.25 SENSITIVE Sensitive     GENTAMICIN <=1 SENSITIVE Sensitive     IMIPENEM 2 SENSITIVE Sensitive     NITROFURANTOIN 32 SENSITIVE Sensitive     TRIMETH/SULFA <=20 SENSITIVE Sensitive     PIP/TAZO <=4 SENSITIVE Sensitive     * 70,000 COLONIES/mL ENTEROBACTER AEROGENES   Pseudomonas aeruginosa - MIC*    CEFTAZIDIME 16 INTERMEDIATE Intermediate     CIPROFLOXACIN 1 INTERMEDIATE Intermediate     GENTAMICIN <=1 SENSITIVE Sensitive     IMIPENEM >=16 RESISTANT Resistant     * 70,000 COLONIES/mL PSEUDOMONAS AERUGINOSA      Radiology Studies: No results found.  Ural Acree M. Cruzita Lederer, MD, PhD [facility]  Between 7 am - 7 pm you can contact me via Amion (for emergencies) or [place] (non urgent matters).  I am not available 7 pm - 7 am, please contact night coverage MD/APP via Amion

## 2023-02-10 NOTE — Plan of Care (Signed)
  Problem: Education: Goal: Knowledge of secondary prevention will improve (MUST DOCUMENT ALL) 02/10/2023 1701 by Nonnie Done, RN Outcome: Progressing 02/10/2023 1401 by Nonnie Done, RN Outcome: Not Progressing Goal: Knowledge of patient specific risk factors will improve Elta Gu[place] N/A or DELETE if not current risk factor) 02/10/2023 1701 by Nonnie Done, RN Outcome: Progressing 02/10/2023 1401 by Nonnie Done, RN Outcome: Not Progressing   Problem: Intracerebral Hemorrhage Tissue Perfusion: Goal: Complications of Intracerebral Hemorrhage will be minimized 02/10/2023 1701 by Nonnie Done, RN Outcome: Progressing 02/10/2023 1401 by Nonnie Done, RN Outcome: Not Progressing   Problem: Coping: Goal: Will identify appropriate support needs 02/10/2023 1701 by Nonnie Done, RN Outcome: Progressing 02/10/2023 1401 by Nonnie Done, RN Outcome: Not Progressing   Problem: Health Behavior/Discharge Planning: Goal: Goals will be collaboratively established with patient/family 02/10/2023 1701 by Nonnie Done, RN Outcome: Progressing 02/10/2023 1401 by Nonnie Done, RN Outcome: Not Progressing   Problem: Nutrition: Goal: Risk of aspiration will decrease Outcome: Progressing Goal: Dietary intake will improve Outcome: Progressing   Problem: Respiratory: Goal: Patent airway maintenance will improve Outcome: Progressing   Problem: Clinical Measurements: Goal: Will remain free from infection Outcome: Progressing   Problem: Nutrition: Goal: Adequate nutrition will be maintained Outcome: Progressing

## 2023-02-11 ENCOUNTER — Encounter (HOSPITAL_COMMUNITY): Payer: Self-pay | Admitting: Neurology

## 2023-02-11 ENCOUNTER — Other Ambulatory Visit: Payer: Self-pay

## 2023-02-11 DIAGNOSIS — J9601 Acute respiratory failure with hypoxia: Secondary | ICD-10-CM | POA: Diagnosis not present

## 2023-02-11 DIAGNOSIS — Z930 Tracheostomy status: Secondary | ICD-10-CM | POA: Diagnosis not present

## 2023-02-11 DIAGNOSIS — I612 Nontraumatic intracerebral hemorrhage in hemisphere, unspecified: Secondary | ICD-10-CM | POA: Diagnosis not present

## 2023-02-11 LAB — BASIC METABOLIC PANEL
Anion gap: 11 (ref 5–15)
BUN: 28 mg/dL — ABNORMAL HIGH (ref 8–23)
CO2: 21 mmol/L — ABNORMAL LOW (ref 22–32)
Calcium: 8.2 mg/dL — ABNORMAL LOW (ref 8.9–10.3)
Chloride: 109 mmol/L (ref 98–111)
Creatinine, Ser: 0.41 mg/dL — ABNORMAL LOW (ref 0.44–1.00)
GFR, Estimated: >60 mL/min (ref >60)
Glucose, Bld: 124 mg/dL — ABNORMAL HIGH (ref 70–99)
Potassium: 3.7 mmol/L (ref 3.5–5.1)
Sodium: 141 mmol/L (ref 135–145)

## 2023-02-11 LAB — CBC
HCT: 33.4 % — ABNORMAL LOW (ref 36.0–46.0)
Hemoglobin: 10.5 g/dL — ABNORMAL LOW (ref 12.0–15.0)
MCH: 30.3 pg (ref 26.0–34.0)
MCHC: 31.4 g/dL (ref 30.0–36.0)
MCV: 96.3 fL (ref 80.0–100.0)
Platelets: 250 10*3/uL (ref 150–400)
RBC: 3.47 MIL/uL — ABNORMAL LOW (ref 3.87–5.11)
RDW: 14.6 % (ref 11.5–15.5)
WBC: 10.8 10*3/uL — ABNORMAL HIGH (ref 4.0–10.5)
nRBC: 0.2 % (ref 0.0–0.2)

## 2023-02-11 LAB — MAGNESIUM: Magnesium: 2 mg/dL (ref 1.7–2.4)

## 2023-02-11 NOTE — Progress Notes (Signed)
NAME:  JOHNATHON MOREJON, MRN:  [id number], DOB:  1941-10-17, LOS: 39 ADMISSION DATE:  11/29/2022, CONSULTATION DATE:  11/29/2022 REFERRING MD:  Leonel Ramsay - Neuro REASON FOR CONSULT: Ventilator management in setting of large IPH  History of Present Illness:  81-yer-old woman who presented to [facility] ED 12/21 as a Code Stroke. LKW 2000 when patient reportedly was in bed and became less responsive with +aphasia and R-sided weakness. One episode of nausea/vomiting en route with EMS. PMHx significant for HTN, chronic sacroiliitis (managed with Percocet), RLS, osteopenia.   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 Labs were grossly unremarkable with WBC mildly elevated to 12 and mild hyperglycemia to 154. Decision was made to intubate patient in ED after several episodes of emesis with concern for aspiration. HTS 3% was initiated.   Stroke service to admit. NSGY consulted with no plan for intervention at this time.   PCCM consulted for ventilator/hemodynamic management.   Pertinent Medical History:  Hypertension Sacroilititis Restless legs syndrome Osteopenia  Significant Hospital Events: Including procedures, antibiotic start and stop dates in addition to other pertinent events   12/21 - Presented to [facility] ED via EMS as Code Stroke. LKW 2000. Abrupt onset of decreased responsiveness, aphasia and R hemiplegia. Stroke admitting, NSGY consulted. PCCM consult for vent management. Treated with unasyn for aspiration pneumonia. CT Head with large L hemispheric parietal/temporal lobe IPH with extension into basal ganglia and IVH. HTS 3% started.   12/24 - Palliative care consulted, family requested meeting to be delayed 12/25 - Family requested goals of care meeting to be delayed 12/26 - Stopped  Unasyn, replete phos; [name] GOC conversation: family says full code 12/27 - Restarted Unasyn for fever, RLL infiltrate; resp culture: enterobacter, MDR. Palliative consulted  12/28 - CT Head unchanged 12/29 - Unasyn changed to bactrim 12/31 - Posturing, CT head with increased shift, increased bleeding 1/2 - Family fired CVA service after discussion RE poor prognosis  1/4 - Off sedation  1/5 - No acute issues overnight. Remains on vent with increased respirations off sedation. Fever curve slightly decreased  1/6 - Pall care meeting moved to 2pm today. [name] the son-in-law will be present. On ven 30%/ On TF. Neo not started last night. MAP > 65 after fluids bolus. Febrile +.  Last CT head 12/09/22.  1/8 - No significant neurologic/clinical change. Brief GOC discussion with husband, [name], at bedside in AM. Concerns from staff re: patient GOC/decisionmaking, want to ensure husband is included in decisions/information sharing. Attempted bedside meeting but family prematurely ended meeting due to escalated discussion. 1/9 - Febrile overnight to 101.30F, remains on bromocriptine. No significant neurologic exam changes. 1/10 Family meeting yesterday with [name], plan for trach/peg though husband says they are still discussing this morning. Per Dt. Hulen Skains " So moving forward, [name] is the designated medical decision maker for Odesser Perlberg " 1/11 no new issues, plan for PEG tomorrow 1/12 for trach/peg today 1/15 No change in Neuro status , trach and PEG, No weaning , will repeat CT head without, get Case management involved in looking for LTAC ( Day 3 post trach on 1/12)   1/15-CT with some stabilization 1/19 No acute events overnight, respiratory culture 1/15 with abundant Pseudomonas, abundant Staphylococcus aureus, and moderate Enterobacter remains on Meropenem  1/22 trach collar trial 12 +  hrs 1/24 trach collar x 24 hours on 35% 1/29 order placed to change trach to cuffless  2/12 No issues  on ATC collar with room air not oxygen  2/19 tolerating trach collar, remains unresponsive 2/26 tolerating 21% Trach collar. Transfered out of ICU.  Interim History / Subjective:  No acute events. Remains unresponsive and on TC.  Objective:   Blood pressure (!) 152/80, pulse 92, temperature 98.7 F (37.1 C), temperature source Axillary, resp. rate (!) 28, height '5\' 1"'$  (1.549 m), weight 43 kg, SpO2 95 %.    FiO2 (%):  [21 %] 21 %   Intake/Output Summary (Last 24 hours) at 02/11/2023 0836 Last data filed at 02/11/2023 0441 Gross per 24 hour  Intake 1305.51 ml  Output 1040 ml  Net 265.51 ml    Filed Weights   02/08/23 0710 02/09/23 0804 02/11/23 0500  Weight: 42.6 kg 43 kg 43 kg   Physical Exam: General: chronically ill appearing woman lying in bed in NAD HEENT: [employer]/AT, eyes anicteric Neuro: not responsive to verbal or painful stimulation, breathing spontaneously.  CV: S1S2, RRR PULM:  minimal scattered rhonchi, no accessory muscle use GI: soft, NT Extremities: no peripheral edema, minimal muscle mass Skin: warm, dry, no diffuse rashes.  3/2 labs reviewed.  No updated imaging.  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 -Not a candidate for decannulation unless her neurological status improves.  Other issues per primary team: MDR UTI ICH with devastating neurologic injury  Best Practice (right click and "Reselect all SmartList Selections" daily)  Per primary   Critical care: N/A    Julian Hy, DO 02/11/23 8:50 AM   Pulmonary & Critical Care  For contact information, see Amion. If no response to pager, please call PCCM consult pager. After hours, 7PM- 7AM, please call Elink.

## 2023-02-11 NOTE — Progress Notes (Signed)
PROGRESS NOTE  Pamela Johnston [id number] DOB: 1941/05/16 DOA: 11/29/2022 PCP: [name], MD   LOS: 34 days   Brief Narrative / Interim history: 82 year old female with history of hypertension who comes into the hospital and is admitted on 11/29/2022 due to decreased responsiveness, aphasia, right-sided weakness.  CT head on admission showed large IPH of left parietal/left temporal regions with extensions into basal ganglia, secondary dissection with IVH and surrounding vasogenic edema with regional mass effect.  She also had several episodes of vomiting with concern for aspiration.  Neurosurgery, neurology were consulted, she was intubated and admitted to neuro ICU.  She was started on 3% hypertonic saline and palliative care was consulted due to poor prognosis.  She eventually underwent tracheostomy as well as PEG placement, and was also started on Keppra due to concern for seizures.  She is currently on trach collar.  Multiple palliative care discussions have been ongoing with the family, at 1 point hospital leadership was involved, she remains full code/full scope.  Earliest she could be placed is a month out from her tracheostomy placement, 2/12.  Now on the difficult to place list.    Significant events: 12/21 - Presented to [facility] ED via EMS as Code Stroke. LKW 2000. Abrupt onset of decreased responsiveness, aphasia and R hemiplegia. Stroke admitting, NSGY consulted. PCCM consult for vent management. Treated with unasyn for aspiration pneumonia. CT Head with large L hemispheric parietal/temporal lobe IPH with extension into basal ganglia and IVH. HTS 3% started.   12/24 - Palliative care consulted, family requested meeting to be delayed 12/25 - Family requested goals of care meeting to be delayed 12/26 - Stopped Unasyn, replete phos; [name] GOC conversation: family says full code 12/27 - Restarted Unasyn for fever, RLL infiltrate; resp culture: enterobacter, MDR. Palliative consulted   12/28 - CT Head unchanged 12/29 - Unasyn changed to bactrim 12/31 - Posturing, CT head with increased shift, increased bleeding 1/2 - Family fired CVA service after discussion RE poor prognosis  1/4 - Off sedation  1/5 - No acute issues overnight. Remains on vent with increased respirations off sedation. Fever curve slightly decreased  1/6 - Pall care meeting moved to 2pm today. [name] the son-in-law will be present. On ven 30%/ On TF. Neo not started last night. MAP > 65 after fluids bolus. Febrile +.  Last CT head 12/09/22.  1/8 - No significant neurologic/clinical change. Brief GOC discussion with husband, [name], at bedside in AM. Concerns from staff re: patient GOC/decisionmaking, want to ensure husband is included in decisions/information sharing. Attempted bedside meeting but family prematurely ended meeting due to escalated discussion. 1/9 - Febrile overnight to 101.67F, remains on bromocriptine. No significant neurologic exam changes. 1/10 Family meeting yesterday with [name], plan for trach/peg though husband says they are still discussing this morning. Per Dt. Hulen Skains " So moving forward, [name] is the designated medical decision maker for Pamela Johnston " 1/11 no new issues, plan for PEG tomorrow 1/12 for trach/peg today 1/15 No change in Neuro status , trach and PEG, No weaning , will repeat CT head without, get Case management involved in looking for LTAC ( Day 3 post trach on 1/12)   1/15-CT with some stabilization 1/19 No acute events overnight, respiratory culture 1/15 with abundant Pseudomonas, abundant Staphylococcus aureus, and moderate Enterobacter remains on Meropenem  1/22 trach collar trial 12 + hrs 1/24 trach collar x 24 hours on 35% 1/25- concern for seizures-- keppra started after EEG done 2/12- concern for  seizures-- EEG reordered, negative 2/19 -PCCM saw for trach maintenance 2/25: ? Aspiration event with PNA 2/28-worsening fever curves, requiring cooling  blanket, white count started to increase, ID consulted due to multiresistant Pseudomonas.  Started on 7-day of Zerbaxa - end date 3/6  Subjective / 24h Interval events: No overnight events.  Remains unresponsive  Assesement and Plan: Principal Problem:   ICH (intracerebral hemorrhage) (HCC) Active Problems:   RLS (restless legs syndrome)   Osteopenia   Sacroiliitis (HCC)   Benign essential HTN   Aspiration into airway   Hypoxia   Intraparenchymal hemorrhage of brain (HCC)   Brain compression (HCC)   Acute respiratory failure with hypoxia (HCC)   Pneumonia due to Enterobacter aerogenes (HCC)   Goals of care, counseling/discussion   Pressure injury of skin   Tracheostomy status (HCC)   Fever   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/mass effect Brain compression/ICH Right parietal lobe hemorrhage with associated edema  Severe encephalopathy Intraventricular hemorrhage and mass effect, vasogenic edema, subfalcine herniation -Patient remains with a very poor prognosis.  Multiple family meetings concluded over time with palliative care, prior attendings, also [place] on board, medical decision maker is [name] patient's son-in-law.  Remains full code and full scope of treatment.  Stroke team signed off. Maintain neuro protective measures. Nutrition and bowel regimen. Aspiration precautions   Fevers -worsening fever curve 2/28, worsening WBC.  Prior respiratory cultures showing Pseudomonas, most recent on 01/03/2023 showing Carbapenem resistance.  Most recent urine cultures also showing same highly resistant Pseudomonas, as well as pansensitive Enterobacter.  ID saw patient 2/29, recommending 7 days of Zerbaxa.  Last dose would be on 3/6 AM  Concern for seizures - On Keppra, underwent a repeat EEG on 2/12 and repeat neurology consulted, due to left sided rhythmic movements.  EEG did not show any seizures.  Neurology signed off   Acute  respiratory failure with hypoxia due to large intracerebral hemorrhage - S/p trach/PEG 1/12.  PCCM following intermittently.  Completed multiple courses of antibiotics in the past, currently on Zerbaxa as above  Urinary retention -foley placed 1/23, Will need urologic follow up as outpatient   History of chronic sacroiliitis Osteopenia RLS -Supportive care  Scheduled Meds:  Chlorhexidine Gluconate Cloth  6 each Topical Daily   famotidine  20 mg Per Tube QHS   feeding supplement (PROSource TF20)  60 mL Per Tube Daily   fiber supplement (BANATROL TF)  60 mL Per Tube BID   Gerhardt's butt cream   Topical QID   latanoprost  1 drop Both Eyes QHS   levETIRAcetam  500 mg Per Tube BID   metoprolol tartrate  12.5 mg Per Tube BID   nutrition supplement (JUVEN)  1 packet Per Tube BID BM   mouth rinse  15 mL Mouth Rinse Q2H   phenylephrine-shark liver oil-mineral oil-petrolatum  1 Application Rectal BID   rosuvastatin  20 mg Per Tube Daily   Continuous Infusions:  sodium chloride Stopped (01/01/23 0424)   ceftolozane-tazobactam (ZERBAXA) 1.5 g in sodium chloride 0.9 % 100 mL IVPB 1.5 g (02/11/23 0505)   feeding supplement (JEVITY 1.2 CAL) 55 mL/hr at 02/10/23 1831   PRN Meds:.acetaminophen **OR** acetaminophen (TYLENOL) oral liquid 160 mg/5 mL **OR** acetaminophen, bisacodyl, docusate, glycopyrrolate, loperamide HCl, morphine injection, mouth rinse, polyethylene glycol  Current Outpatient Medications  Medication Instructions   latanoprost (XALATAN) 0.005 % ophthalmic solution 1 drop, Both Eyes, Daily at bedtime   oxyCODONE-acetaminophen (PERCOCET/ROXICET) 5-325 MG tablet 1 tablet,  Oral, Every 4 hours PRN   pramipexole (MIRAPEX) 0.25 mg, Oral, Nightly    Diet Orders (From admission, onward)     Start     Ordered   12/19/22 0001  Diet NPO time specified  Diet effective midnight        12/18/22 1632            DVT prophylaxis: SCD's Start: 11/29/22 2215   Lab Results  Component  Value Date   PLT 221 02/09/2023      Code Status: Full Code  Family Communication: No family at bedside  Status is: Inpatient  Remains inpatient appropriate because: needs placement  Level of care: Progressive  Consultants:  Neurology Neurosurgery PCCM Palliative care  Objective: Vitals:   02/10/23 2300 02/11/23 0006 02/11/23 0300 02/11/23 0500  BP: 139/71  (!) 152/80   Pulse: 85 87 92   Resp: (!) 29 (!) 26 (!) 28   Temp: 99.5 F (37.5 C)  98.7 F (37.1 C)   TempSrc: Axillary  Axillary   SpO2: 95% 96% 94% 95%  Weight:    43 kg  Height:        Intake/Output Summary (Last 24 hours) at 02/11/2023 0835 Last data filed at 02/11/2023 0441 Gross per 24 hour  Intake 1305.51 ml  Output 1040 ml  Net 265.51 ml    Wt Readings from Last 3 Encounters:  02/11/23 43 kg    Examination: Constitutional: Unresponsive Eyes: lids and conjunctivae normal, no scleral icterus ENMT: mmm Neck: normal, supple Respiratory: Gurgling breath sounds, increased secretions Cardiovascular: Regular rate and rhythm, no murmurs / rubs / gallops. No LE edema. Abdomen: soft, no distention, no tenderness. Bowel sounds positive.  Skin: no rashes  Data Reviewed: I have independently reviewed following labs and imaging studies   CBC Recent Labs  Lab 02/06/23 0654 02/07/23 0337 02/08/23 0653 02/09/23 0807  WBC 10.9* 12.7* 8.0 8.3  HGB 10.9* 13.3 11.1* 10.1*  HCT 35.3* 43.7 35.3* 32.7*  PLT 267 247 263 221  MCV 96.2 98.0 94.6 96.7  MCH 29.7 29.8 29.8 29.9  MCHC 30.9 30.4 31.4 30.9  RDW 14.5 14.4 14.3 14.6     Recent Labs  Lab 02/06/23 0654 02/07/23 0337 02/08/23 0653 02/09/23 0807  NA 142 143 142 146*  K 3.0* 4.7 2.9* 3.7  CL 106 109 107 114*  CO2 '26 24 30 24  '$ GLUCOSE 131* 115* 113* 120*  BUN 28* 30* 37* 36*  CREATININE 0.48 0.52 0.48 0.59  CALCIUM 8.2* 8.5* 8.3* 8.2*  AST  --  102* 57* 46*  ALT  --  64* 57* 48*  ALKPHOS  --  164* 144* 143*  BILITOT  --  1.1 0.6 0.3   ALBUMIN  --  2.4* 2.1* 2.1*  MG  --  2.2 2.2 2.3      ------------------------------------------------------------------------------------------------------------------ No results for input(s): "CHOL", "HDL", "LDLCALC", "TRIG", "CHOLHDL", "LDLDIRECT" in the last 72 hours.  Lab Results  Component Value Date   HGBA1C 5.5 12/01/2022   ------------------------------------------------------------------------------------------------------------------ No results for input(s): "TSH", "T4TOTAL", "T3FREE", "THYROIDAB" in the last 72 hours.  Invalid input(s): "FREET3"  Cardiac Enzymes No results for input(s): "CKMB", "TROPONINI", "MYOGLOBIN" in the last 168 hours.  Invalid input(s): "CK" ------------------------------------------------------------------------------------------------------------------ No results found for: "BNP"  CBG: Recent Labs  Lab 02/05/23 0830 02/05/23 1157 02/05/23 1548 02/05/23 1911  GLUCAP 152* 126* 160* 149*     No results found for this or any previous visit (from the past 240 hour(s)).  Radiology Studies: No results found.  Pamela Johnston M. Cruzita Lederer, MD, PhD [facility]  Between 7 am - 7 pm you can contact me via Amion (for emergencies) or [place] (non urgent matters).  I am not available 7 pm - 7 am, please contact night coverage MD/APP via Amion

## 2023-02-11 NOTE — Progress Notes (Signed)
Pharmacy Antibiotic Note  Pamela Johnston is a 82 y.o. female admitted on 11/29/2022 with pneumonia.  Pharmacy has been consulted for Zerbaxa dosing.  Plan: Continue Zerbaxa 1.5g Q8H.  7 day stop date placed per ID recommendations.  Monitor renal fxn for dose adjustments.   Height: '5\' 1"'$  (154.9 cm) Weight: 43 kg (94 lb 12.8 oz) IBW/kg (Calculated) : 47.8  Temp (24hrs), Avg:99.4 F (37.4 C), Min:98.7 F (37.1 C), Max:99.8 F (37.7 C)  Recent Labs  Lab 02/06/23 0654 02/07/23 0337 02/08/23 0653 02/09/23 0807 02/11/23 0944  WBC 10.9* 12.7* 8.0 8.3 10.8*  CREATININE 0.48 0.52 0.48 0.59 0.41*     Estimated Creatinine Clearance: 38.7 mL/min (A) (by C-G formula based on SCr of 0.41 mg/dL (L)).    Allergies  Allergen Reactions   Nickel Other (See Comments)    Verified by Allergist   Penicillin G Other (See Comments)    Unknown reaction Tolerated Unasyn 11/2022.  TDD.    Thank you for allowing pharmacy to be a part of this patient's care.  Wilson Singer, PharmD Clinical Pharmacist 02/11/2023 11:01 AM

## 2023-02-12 DIAGNOSIS — J9601 Acute respiratory failure with hypoxia: Secondary | ICD-10-CM | POA: Diagnosis not present

## 2023-02-12 DIAGNOSIS — Z930 Tracheostomy status: Secondary | ICD-10-CM | POA: Diagnosis not present

## 2023-02-12 NOTE — Progress Notes (Signed)
Orthopedic Tech Progress Note Patient Details:  [name] 12/19/1940 [id number]  Patient ID: Pamela Johnston, female   DOB: February 03, 1941, 82 y.o.   MRN: [id number] Discussion via secure chat with nursing if pt still needed unna boots at this time.  Per he discussion with MD, pt no longer needs them so they have been discharged.   Vonda Harth OTR/L 02/12/2023, 12:36 PM

## 2023-02-12 NOTE — Progress Notes (Signed)
PROGRESS NOTE  Pamela Johnston [id number] DOB: [birth year] DOA: 11/29/2022 PCP: [name], MD   LOS: 75 days   Brief Narrative / Interim history: 82 year old female with history of hypertension who comes into the hospital and is admitted on 11/29/2022 due to decreased responsiveness, aphasia, right-sided weakness.  CT head on admission showed large IPH of left parietal/left temporal regions with extensions into basal ganglia, secondary dissection with IVH and surrounding vasogenic edema with regional mass effect.  She also had several episodes of vomiting with concern for aspiration.  Neurosurgery, neurology were consulted, she was intubated and admitted to neuro ICU.  She was started on 3% hypertonic saline and palliative care was consulted due to poor prognosis.  She eventually underwent tracheostomy as well as PEG placement, and was also started on Keppra due to concern for seizures.  She is currently on trach collar.  Multiple palliative care discussions have been ongoing with the family, at 1 point hospital leadership was involved, she remains full code/full scope.  Earliest she could be placed is a month out from her tracheostomy placement, 2/12.  Now on the difficult to place list.    Significant events: 12/21 - Presented to [facility] ED via EMS as Code Stroke. LKW 2000. Abrupt onset of decreased responsiveness, aphasia and R hemiplegia. Stroke admitting, NSGY consulted. PCCM consult for vent management. Treated with unasyn for aspiration pneumonia. CT Head with large L hemispheric parietal/temporal lobe IPH with extension into basal ganglia and IVH. HTS 3% started.   12/24 - Palliative care consulted, family requested meeting to be delayed 12/25 - Family requested goals of care meeting to be delayed 12/26 - Stopped Unasyn, replete phos; [name] GOC conversation: family says full code 12/27 - Restarted Unasyn for fever, RLL infiltrate; resp culture: enterobacter, MDR. Palliative consulted   12/28 - CT Head unchanged 12/29 - Unasyn changed to bactrim 12/31 - Posturing, CT head with increased shift, increased bleeding 1/2 - Family fired CVA service after discussion RE poor prognosis  1/4 - Off sedation  1/5 - No acute issues overnight. Remains on vent with increased respirations off sedation. Fever curve slightly decreased  1/6 - Pall care meeting moved to 2pm today. [name] the son-in-law will be present. On ven 30%/ On TF. Neo not started last night. MAP > 65 after fluids bolus. Febrile +.  Last CT head 12/09/22.  1/8 - No significant neurologic/clinical change. Brief GOC discussion with husband, Pamela Johnston, at bedside in AM. Concerns from staff re: patient GOC/decisionmaking, want to ensure husband is included in decisions/information sharing. Attempted bedside meeting but family prematurely ended meeting due to escalated discussion. 1/9 - Febrile overnight to 101.45F, remains on bromocriptine. No significant neurologic exam changes. 1/10 Family meeting yesterday with [name], plan for trach/peg though husband says they are still discussing this morning. Per Dt. Hulen Skains " So moving forward, Pamela Johnston is the designated medical decision maker for Pamela Johnston " 1/11 no new issues, plan for PEG tomorrow 1/12 for trach/peg today 1/15 No change in Neuro status , trach and PEG, No weaning , will repeat CT head without, get Case management involved in looking for LTAC ( Day 3 post trach on 1/12)   1/15-CT with some stabilization 1/19 No acute events overnight, respiratory culture 1/15 with abundant Pseudomonas, abundant Staphylococcus aureus, and moderate Enterobacter remains on Meropenem  1/22 trach collar trial 12 + hrs 1/24 trach collar x 24 hours on 35% 1/25- concern for seizures-- keppra started after EEG done 2/12- concern for  seizures-- EEG reordered, negative 2/19 -PCCM saw for trach maintenance 2/25: ? Aspiration event with PNA 2/28-worsening fever curves, requiring cooling  blanket, white count started to increase, ID consulted due to multiresistant Pseudomonas.  Started on 7-day of Zerbaxa - end date 3/6  Subjective / 24h Interval events: No overnight events.  Remains unresponsive.  Assesement and Plan: Principal Problem:   ICH (intracerebral hemorrhage) (HCC) Active Problems:   RLS (restless legs syndrome)   Osteopenia   Sacroiliitis (HCC)   Benign essential HTN   Aspiration into airway   Hypoxia   Intraparenchymal hemorrhage of brain (HCC)   Brain compression (HCC)   Acute respiratory failure with hypoxia (HCC)   Pneumonia due to Enterobacter aerogenes (HCC)   Goals of care, counseling/discussion   Pressure injury of skin   Tracheostomy status (HCC)   Fever   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/mass effect Brain compression/ICH Right parietal lobe hemorrhage with associated edema  Severe encephalopathy Intraventricular hemorrhage and mass effect, vasogenic edema, subfalcine herniation -Patient remains with a very poor prognosis.  Multiple family meetings concluded over time with palliative care, prior attendings, also [place] on board, medical decision maker is Pamela Johnston patient's son-in-law.  Remains full code and full scope of treatment.  Stroke team signed off. Maintain neuro protective measures. Nutrition and bowel regimen. Aspiration precautions. -Discussed again with the husband yesterday, reiterated very poor prognosis.  Fevers -worsening fever curve 2/28, worsening WBC.  Prior respiratory cultures showing Pseudomonas, most recent on 01/03/2023 showing Carbapenem resistance.  Most recent urine cultures also showing same highly resistant Pseudomonas, as well as pansensitive Enterobacter.  ID saw patient 2/29, recommending 7 days of Zerbaxa.  Last dose would be on 3/6 AM  Concern for seizures - On Keppra, underwent a repeat EEG on 2/12 and repeat neurology consulted, due to left sided rhythmic  movements.  EEG did not show any seizures.  Neurology signed off   Acute respiratory failure with hypoxia due to large intracerebral hemorrhage - S/p trach/PEG 1/12.  PCCM following intermittently.  Completed multiple courses of antibiotics in the past, currently on Zerbaxa as above  Urinary retention -foley placed 1/23, Will need urologic follow up as outpatient   History of chronic sacroiliitis Osteopenia RLS -Supportive care  Scheduled Meds:  Chlorhexidine Gluconate Cloth  6 each Topical Daily   famotidine  20 mg Per Tube QHS   feeding supplement (PROSource TF20)  60 mL Per Tube Daily   fiber supplement (BANATROL TF)  60 mL Per Tube BID   Gerhardt's butt cream   Topical QID   latanoprost  1 drop Both Eyes QHS   levETIRAcetam  500 mg Per Tube BID   metoprolol tartrate  12.5 mg Per Tube BID   nutrition supplement (JUVEN)  1 packet Per Tube BID BM   mouth rinse  15 mL Mouth Rinse Q2H   phenylephrine-shark liver oil-mineral oil-petrolatum  1 Application Rectal BID   rosuvastatin  20 mg Per Tube Daily   Continuous Infusions:  sodium chloride Stopped (01/01/23 0424)   ceftolozane-tazobactam (ZERBAXA) 1.5 g in sodium chloride 0.9 % 100 mL IVPB 1.5 g (02/12/23 0511)   feeding supplement (JEVITY 1.2 CAL) 1,000 mL (02/11/23 1346)   PRN Meds:.acetaminophen **OR** acetaminophen (TYLENOL) oral liquid 160 mg/5 mL **OR** acetaminophen, bisacodyl, docusate, glycopyrrolate, loperamide HCl, morphine injection, mouth rinse, polyethylene glycol  Current Outpatient Medications  Medication Instructions   latanoprost (XALATAN) 0.005 % ophthalmic solution 1 drop, Both Eyes, Daily at bedtime  oxyCODONE-acetaminophen (PERCOCET/ROXICET) 5-325 MG tablet 1 tablet, Oral, Every 4 hours PRN   pramipexole (MIRAPEX) 0.25 mg, Oral, Nightly    Diet Orders (From admission, onward)     Start     Ordered   12/19/22 0001  Diet NPO time specified  Diet effective midnight        12/18/22 1632             DVT prophylaxis: SCD's Start: 11/29/22 2215   Lab Results  Component Value Date   PLT 250 02/11/2023      Code Status: Full Code  Family Communication: Husband at bedside  Status is: Inpatient  Remains inpatient appropriate because: needs placement  Level of care: Progressive  Consultants:  Neurology Neurosurgery PCCM Palliative care  Objective: Vitals:   02/11/23 2312 02/11/23 2330 02/12/23 0309 02/12/23 0742  BP: (!) 105/54  (!) 117/55 (!) 142/85  Pulse: 79  97 (!) 110  Resp: (!) 28  20 (!) 30  Temp: 99.4 F (37.4 C)  98.7 F (37.1 C) 98.8 F (37.1 C)  TempSrc: Axillary  Axillary Axillary  SpO2: 95% 95% 92% 97%  Weight:      Height:        Intake/Output Summary (Last 24 hours) at 02/12/2023 0820 Last data filed at 02/12/2023 0816 Gross per 24 hour  Intake 60 ml  Output 1180 ml  Net -1120 ml    Wt Readings from Last 3 Encounters:  02/11/23 43 kg    Examination: Constitutional: Unresponsive Respiratory: CTA, occasional cough Cardiovascular: RRR  Data Reviewed: I have independently reviewed following labs and imaging studies   CBC Recent Labs  Lab 02/06/23 0654 02/07/23 0337 02/08/23 0653 02/09/23 0807 02/11/23 0944  WBC 10.9* 12.7* 8.0 8.3 10.8*  HGB 10.9* 13.3 11.1* 10.1* 10.5*  HCT 35.3* 43.7 35.3* 32.7* 33.4*  PLT 267 247 263 221 250  MCV 96.2 98.0 94.6 96.7 96.3  MCH 29.7 29.8 29.8 29.9 30.3  MCHC 30.9 30.4 31.4 30.9 31.4  RDW 14.5 14.4 14.3 14.6 14.6     Recent Labs  Lab 02/06/23 0654 02/07/23 0337 02/08/23 0653 02/09/23 0807 02/11/23 0944  NA 142 143 142 146* 141  K 3.0* 4.7 2.9* 3.7 3.7  CL 106 109 107 114* 109  CO2 '26 24 30 24 '$ 21*  GLUCOSE 131* 115* 113* 120* 124*  BUN 28* 30* 37* 36* 28*  CREATININE 0.48 0.52 0.48 0.59 0.41*  CALCIUM 8.2* 8.5* 8.3* 8.2* 8.2*  AST  --  102* 57* 46*  --   ALT  --  64* 57* 48*  --   ALKPHOS  --  164* 144* 143*  --   BILITOT  --  1.1 0.6 0.3  --   ALBUMIN  --  2.4* 2.1* 2.1*  --    MG  --  2.2 2.2 2.3 2.0      ------------------------------------------------------------------------------------------------------------------ No results for input(s): "CHOL", "HDL", "LDLCALC", "TRIG", "CHOLHDL", "LDLDIRECT" in the last 72 hours.  Lab Results  Component Value Date   HGBA1C 5.5 12/01/2022   ------------------------------------------------------------------------------------------------------------------ No results for input(s): "TSH", "T4TOTAL", "T3FREE", "THYROIDAB" in the last 72 hours.  Invalid input(s): "FREET3"  Cardiac Enzymes No results for input(s): "CKMB", "TROPONINI", "MYOGLOBIN" in the last 168 hours.  Invalid input(s): "CK" ------------------------------------------------------------------------------------------------------------------ No results found for: "BNP"  CBG: Recent Labs  Lab 02/05/23 0830 02/05/23 1157 02/05/23 1548 02/05/23 1911  GLUCAP 152* 126* 160* 149*     No results found for this or any previous visit (from  the past 240 hour(s)).     Radiology Studies: No results found.  Amoree Newlon M. Cruzita Lederer, MD, PhD [facility]  Between 7 am - 7 pm you can contact me via Amion (for emergencies) or [place] (non urgent matters).  I am not available 7 pm - 7 am, please contact night coverage MD/APP via Amion

## 2023-02-13 DIAGNOSIS — I612 Nontraumatic intracerebral hemorrhage in hemisphere, unspecified: Secondary | ICD-10-CM | POA: Diagnosis not present

## 2023-02-13 DIAGNOSIS — R0902 Hypoxemia: Secondary | ICD-10-CM | POA: Diagnosis not present

## 2023-02-13 DIAGNOSIS — I619 Nontraumatic intracerebral hemorrhage, unspecified: Secondary | ICD-10-CM | POA: Diagnosis not present

## 2023-02-13 LAB — COMPREHENSIVE METABOLIC PANEL
ALT: 23 U/L (ref 0–44)
AST: 36 U/L (ref 15–41)
Albumin: 2.2 g/dL — ABNORMAL LOW (ref 3.5–5.0)
Alkaline Phosphatase: 178 U/L — ABNORMAL HIGH (ref 38–126)
Anion gap: 10 (ref 5–15)
BUN: 26 mg/dL — ABNORMAL HIGH (ref 8–23)
CO2: 25 mmol/L (ref 22–32)
Calcium: 8.7 mg/dL — ABNORMAL LOW (ref 8.9–10.3)
Chloride: 106 mmol/L (ref 98–111)
Creatinine, Ser: 0.48 mg/dL (ref 0.44–1.00)
GFR, Estimated: >60 mL/min (ref >60)
Glucose, Bld: 104 mg/dL — ABNORMAL HIGH (ref 70–99)
Potassium: 4.3 mmol/L (ref 3.5–5.1)
Sodium: 141 mmol/L (ref 135–145)
Total Bilirubin: 0.4 mg/dL (ref 0.3–1.2)
Total Protein: 5.9 g/dL — ABNORMAL LOW (ref 6.5–8.1)

## 2023-02-13 LAB — CBC
HCT: 35.1 % — ABNORMAL LOW (ref 36.0–46.0)
Hemoglobin: 10.6 g/dL — ABNORMAL LOW (ref 12.0–15.0)
MCH: 29.2 pg (ref 26.0–34.0)
MCHC: 30.2 g/dL (ref 30.0–36.0)
MCV: 96.7 fL (ref 80.0–100.0)
Platelets: 280 10*3/uL (ref 150–400)
RBC: 3.63 MIL/uL — ABNORMAL LOW (ref 3.87–5.11)
RDW: 14.8 % (ref 11.5–15.5)
WBC: 11.5 10*3/uL — ABNORMAL HIGH (ref 4.0–10.5)
nRBC: 0 % (ref 0.0–0.2)

## 2023-02-13 LAB — MAGNESIUM: Magnesium: 2.2 mg/dL (ref 1.7–2.4)

## 2023-02-13 NOTE — Plan of Care (Signed)
  Problem: Education: Goal: Knowledge of disease or condition will improve Outcome: Not Progressing   Problem: Health Behavior/Discharge Planning: Goal: Ability to manage health-related needs will improve Outcome: Not Progressing   Problem: Self-Care: Goal: Ability to participate in self-care as condition permits will improve Outcome: Not Progressing

## 2023-02-13 NOTE — Progress Notes (Signed)
[facility] PROGRESS NOTE    Progress Note  Pamela Johnston  [id number] DOB: 1941-10-17 DOA: 11/29/2022 PCP: [name], MD     Brief Narrative:   Pamela Johnston is an 82 y.o. 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  Neurology and neurosurgery was consulted she was intubated and admitted to the neuro ICU started on 3% saline palliative care was consulted due to her poor prognosis.  Underwent tracheostomy as well as PEG tube placement she was also started on Keppra for concerns of seizures.  She is currently on trach collar, palliative care has had multiple discussions with the family at 1 point hospital leadership was involved she remains a full code.  Clearly if she could be placed is a month out from her tracheostomy on 01/21/2023 now in the difficult to place list.    Significant Events: 12/21 - Presented to [facility] ED via EMS as Code Stroke. LKW 2000. Abrupt onset of decreased responsiveness, aphasia and R hemiplegia. Stroke admitting, NSGY consulted. PCCM consult for vent management. Treated with unasyn for aspiration pneumonia. CT Head with large L hemispheric parietal/temporal lobe IPH with extension into basal ganglia and IVH. HTS 3% started.   12/24 - Palliative care consulted, family requested meeting to be delayed 12/25 - Family requested goals of care meeting to be delayed 12/26 - Stopped Unasyn, replete phos; [name] GOC conversation: family says full code 12/27 - Restarted Unasyn for fever, RLL infiltrate; resp culture: enterobacter, MDR. Palliative consulted   12/28 - CT Head unchanged 12/29 - Unasyn changed to bactrim 12/31 - Posturing, CT head with increased shift, increased bleeding 1/2 - Family fired CVA service after discussion RE poor prognosis  1/4 - Off sedation  1/5 - No acute issues overnight. Remains on vent with increased respirations off sedation. Fever curve slightly decreased  1/6 - Pall care meeting moved to 2pm today. [name] the son-in-law will be present. On ven 30%/ On TF. Neo not started last night. MAP > 65 after fluids bolus. Febrile +.  Last CT head 12/09/22.  1/8 - No significant neurologic/clinical change. Brief GOC discussion with husband, [name], at bedside in AM. Concerns from staff re: patient GOC/decisionmaking, want to ensure husband is included in decisions/information sharing. Attempted bedside meeting but family prematurely ended meeting due to escalated discussion. 1/9 - Febrile overnight to 101.25F, remains on bromocriptine. No significant neurologic exam changes. 1/10 Family meeting yesterday with [name], plan for trach/peg though husband says they are still discussing this morning. Per Dt. Hulen Skains " So moving forward, Pamela Johnston is the designated medical decision maker for Bedie Bohmer " 1/11 no new issues, plan for PEG tomorrow 1/12 for trach/peg today 1/15 No change in Neuro status , trach and PEG, No weaning , will repeat CT head without, get Case management involved in looking for LTAC ( Day 3 post trach on 1/12)   1/15-CT with some stabilization 1/19 No acute events overnight, respiratory culture 1/15 with abundant Pseudomonas, abundant Staphylococcus aureus, and moderate Enterobacter remains on Meropenem  1/22 trach collar trial 12 + hrs 1/24 trach collar x 24 hours on 35% 1/25-  concern for seizures-- keppra started after EEG done 2/12- concern for seizures-- EEG reordered, negative 2/19 -PCCM saw for trach maintenance 2/25: ? Aspiration event with PNA 2/28-worsening fever curves, requiring cooling  blanket, white count started to increase, ID consulted due to multiresistant Pseudomonas.  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  Multiple family meetings has happened over time with palliative care and prior attendings including the [facility]. Medical decision making is [name] patient's son-in-law. Remains a full code, stroke team has signed off. She is at high risk of aspiration. She has no chance of recovery.  Fevers: On 12/07/2023 she developed worsening fevers and leukocytosis. Prior respiratory cultures showed Pseudomonas carbapenem resistant. Recent urine culture also showed highly resistant Pseudomonas and pansensitive Enterobacter. ID was consulted recommended 7 days of Zerbaxa last dose will be on 02/13/2023.  Concerns for seizures: On Keppra underwent EEG on 11/21/2023 due to left-sided rhythmic movements. EEG showed no seizures neurology has now signed off.  Acute respiratory failure with hypoxia due to large intracerebral hemorrhage: Status post trach and PEG on 12/21/2022. PCCM following intermittently.  Urinary retention: Foley placed on 01/01/2023 will need urologic evaluation as an outpatient.  History of chronic sacroiliitis/osteopenia/RLS: Continue supportive care.  Stage I significant results are/right ear ulcer RN Pressure Injury Documentation: Pressure Injury 12/22/22 Ear Right Stage 1 -  Intact skin with non-blanchable redness of a localized area usually over a bony prominence. (Active)  12/22/22 1816  Location: Ear  Location Orientation: Right  Staging: Stage 1 -  Intact skin with non-blanchable redness of a localized area usually over a bony prominence.  Wound  Description (Comments):   Present on Admission: No  Dressing Type Foam - Lift dressing to assess site every shift 02/12/23 1935     Pressure Injury 02/07/23 Coccyx Medial Deep Tissue Pressure Injury - Purple or maroon localized area of discolored intact skin or blood-filled blister due to damage of underlying soft tissue from pressure and/or shear. Deep tissue injury on Coccyx (Active)  02/07/23 1150  Location: Coccyx  Location Orientation: Medial  Staging: Deep Tissue Pressure Injury - Purple or maroon localized area of discolored intact skin or blood-filled blister due to damage of underlying soft tissue from pressure and/or shear.  Wound Description (Comments): Deep tissue injury on Coccyx  Present on Admission:   Dressing Type Foam - Lift dressing to assess site every shift 02/12/23 1935     DVT prophylaxis: scd Family Communication:sin in law Status is: Inpatient Remains inpatient appropriate because: Devastating large left hemispheric parietal infarct    Code Status:     Code Status Orders  (From admission, onward)           Start     Ordered   11/30/22 1451  Full code  Continuous       Question:  By:  Answer:  Consent: discussion documented in EHR   11/30/22 1450           Code Status History     Date Active Date Inactive Code Status Order ID Comments User Context   11/30/2022 0145 11/30/2022 1450 DNR [id number]  Maryjane Hurter, MD ED   11/29/2022 2218 11/30/2022 0144 Full Code [id number]  Greta Doom, MD ED      Advance Directive Documentation  Flowsheet Row Most Recent Value  Type of Advance Directive Healthcare Power of Attorney  Pre-existing out of facility DNR order (yellow form or pink MOST form) --  "MOST" Form in Place? --         IV Access:   Peripheral IV   Procedures and diagnostic studies:   No results found.   Medical Consultants:   None.   Subjective:    GIULIANNA KRENKE nonverbal  Objective:     Vitals:   02/12/23 2015 02/12/23 2326 02/13/23 0342 02/13/23 0500  BP:  123/62 134/62   Pulse:  96 (!) 113   Resp:  (!) 35 (!) 30   Temp:  98.8 F (37.1 C) 99.1 F (37.3 C)   TempSrc:  Axillary Oral   SpO2: 95% 96% 97% 95%  Weight:      Height:       SpO2: 95 % O2 Flow Rate (L/min): 5 L/min FiO2 (%): 21 %   Intake/Output Summary (Last 24 hours) at 02/13/2023 0718 Last data filed at 02/13/2023 0422 Gross per 24 hour  Intake 90 ml  Output 1825 ml  Net -1735 ml   Filed Weights   02/09/23 0804 02/11/23 0500 02/12/23 0500  Weight: 43 kg 43 kg 43 kg    Exam: General exam: In no acute distress, on responsive Respiratory system: Good air movement and clear to auscultation. Cardiovascular system: S1 & S2 heard, RRR. No JVD,. Gastrointestinal system: Abdomen is nondistended, soft and nontender.  Extremities: No pedal edema. Skin: No rashes, lesions or ulcers Data Reviewed:    Labs: Basic Metabolic Panel: Recent Labs  Lab 02/07/23 0337 02/08/23 0653 02/09/23 0807 02/11/23 0944 02/13/23 0449  NA 143 142 146* 141 141  K 4.7 2.9* 3.7 3.7 4.3  CL 109 107 114* 109 106  CO2 '24 30 24 '$ 21* 25  GLUCOSE 115* 113* 120* 124* 104*  BUN 30* 37* 36* 28* 26*  CREATININE 0.52 0.48 0.59 0.41* 0.48  CALCIUM 8.5* 8.3* 8.2* 8.2* 8.7*  MG 2.2 2.2 2.3 2.0 2.2   GFR Estimated Creatinine Clearance: 38.7 mL/min (by C-G formula based on SCr of 0.48 mg/dL). Liver Function Tests: Recent Labs  Lab 02/07/23 0337 02/08/23 0653 02/09/23 0807 02/13/23 0449  AST 102* 57* 46* 36  ALT 64* 57* 48* 23  ALKPHOS 164* 144* 143* 178*  BILITOT 1.1 0.6 0.3 0.4  PROT 6.9 6.3* 6.0* 5.9*  ALBUMIN 2.4* 2.1* 2.1* 2.2*   No results for input(s): "LIPASE", "AMYLASE" in the last 168 hours. No results for input(s): "AMMONIA" in the last 168 hours. Coagulation profile No results for input(s): "INR", "PROTIME" in the last 168 hours. COVID-19 Labs  No results for input(s): "DDIMER", "FERRITIN", "LDH",  "CRP" in the last 72 hours.  No results found for: "SARSCOV2NAA"  CBC: Recent Labs  Lab 02/07/23 0337 02/08/23 0653 02/09/23 0807 02/11/23 0944 02/13/23 0619  WBC 12.7* 8.0 8.3 10.8* 11.5*  HGB 13.3 11.1* 10.1* 10.5* 10.6*  HCT 43.7 35.3* 32.7* 33.4* 35.1*  MCV 98.0 94.6 96.7 96.3 96.7  PLT 247 263 221 250 280   Cardiac Enzymes: No results for input(s): "CKTOTAL", "CKMB", "CKMBINDEX", "TROPONINI" in the last 168 hours. BNP (last 3 results) No results for input(s): "PROBNP" in the last 8760 hours. CBG: No results for input(s): "GLUCAP" in the last 168 hours. D-Dimer: No results for input(s): "DDIMER" in the last 72 hours. Hgb A1c: No results for input(s): "HGBA1C" in the last 72 hours. Lipid Profile: No results for  input(s): "CHOL", "HDL", "LDLCALC", "TRIG", "CHOLHDL", "LDLDIRECT" in the last 72 hours. Thyroid function studies: No results for input(s): "TSH", "T4TOTAL", "T3FREE", "THYROIDAB" in the last 72 hours.  Invalid input(s): "FREET3" Anemia work up: No results for input(s): "VITAMINB12", "FOLATE", "FERRITIN", "TIBC", "IRON", "RETICCTPCT" in the last 72 hours. Sepsis Labs: Recent Labs  Lab 02/08/23 0653 02/09/23 0807 02/11/23 0944 02/13/23 0619  WBC 8.0 8.3 10.8* 11.5*   Microbiology No results found for this or any previous visit (from the past 240 hour(s)).   Medications:    Chlorhexidine Gluconate Cloth  6 each Topical Daily   famotidine  20 mg Per Tube QHS   feeding supplement (PROSource TF20)  60 mL Per Tube Daily   fiber supplement (BANATROL TF)  60 mL Per Tube BID   Gerhardt's butt cream   Topical QID   latanoprost  1 drop Both Eyes QHS   levETIRAcetam  500 mg Per Tube BID   metoprolol tartrate  12.5 mg Per Tube BID   nutrition supplement (JUVEN)  1 packet Per Tube BID BM   mouth rinse  15 mL Mouth Rinse Q2H   phenylephrine-shark liver oil-mineral oil-petrolatum  1 Application Rectal BID   rosuvastatin  20 mg Per Tube Daily   Continuous  Infusions:  sodium chloride Stopped (01/01/23 0424)   ceftolozane-tazobactam (ZERBAXA) 1.5 g in sodium chloride 0.9 % 100 mL IVPB 1.5 g (02/12/23 2123)   feeding supplement (JEVITY 1.2 CAL) 1,000 mL (02/13/23 0504)      LOS: 69 days   Charlynne Cousins  [facility]  02/13/2023, 7:18 AM

## 2023-02-13 NOTE — Progress Notes (Signed)
Physical Therapy Treatment Patient Details Name: Pamela Johnston MRN: [id number] DOB: December 18, 1940 Today's Date: 02/13/2023   History of Present Illness 81-yer-old woman who presented to [facility] ED 12/21 as a Code Stroke with +aphasia and R-sided weakness. 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 Pt underwent trach and peg on 12/19/22. PMHx significant for HTN, chronic sacroiliitis (managed with Percocet), RLS, osteopenia.    PT Comments    Patient up to EOB this session with +2 total A.  Sitting for about 5 minutes with support for trunk and head performing trunk rotation and R shoulder PROM for mobilizing ribs over chest and improved respiratory clearance.  PT coughing x 2 during session but non-productive.  RT in to check pt and reports she had recently suctioned.  VSS throughout.  Remains contracted on R shoulder & elbow, L knee and hip.  Discussed with HCPOA events of session and proper ROM techniques via phone.  PT will follow. Recommend long term care at d/c.   Recommendations for follow up therapy are one component of a multi-disciplinary discharge planning process, led by the attending physician.  Recommendations may be updated based on patient status, additional functional criteria and insurance authorization.  Follow Up Recommendations  Long-term institutional care without follow-up therapy Can patient physically be transported by private vehicle: No   Assistance Recommended at Discharge Frequent or constant Supervision/Assistance  Patient can return home with the following Two people to help with walking and/or transfers;Two people to help with bathing/dressing/bathroom;Assistance with feeding;Assist for transportation;Direct supervision/assist for medications management   Equipment Recommendations  None recommended by PT    Recommendations for Other Services        Precautions / Restrictions Precautions Precautions: Fall Precaution Comments: trach, PEG     Mobility  Bed Mobility Overal bed mobility: Needs Assistance Bed Mobility: Supine to Sit, Sit to Supine     Supine to sit: HOB elevated, Total assist, +2 for physical assistance Sit to supine: +2 for physical assistance, Total assist   General bed mobility comments: up to sit EOB dependent    Transfers                   General transfer comment: unable    Ambulation/Gait                   Stairs             Wheelchair Mobility    Modified Rankin (Stroke Patients Only) Modified Rankin (Stroke Patients Only) Pre-Morbid Rankin Score: Moderately severe disability Modified Rankin: Severe disability     Balance Overall balance assessment: Needs assistance   Sitting balance-Leahy Scale: Zero Sitting balance - Comments: sitting with support for trunk and head control about 5 minutes working on trunk rotation/stretching, head control for prime respiratory positioning                                    Cognition Arousal/Alertness: Lethargic Behavior During Therapy: Flat affect Overall Cognitive Status: Impaired/Different from baseline                                 General Comments: not following commands, opens eyes some with sitting EOB but not localizing to stimuli  Exercises Other Exercises Other Exercises: PROM x4 extremities in supine noted limitations in L knee extension, L hip abduction/ER L elbow extension and pronation and R shoulder flexion/ER/ABD and elbow flexion    General Comments General comments (skin integrity, edema, etc.): VSS with mobility; spoke with son-in-law, Shanon Brow, by phone regarding PT activities, pt response and ROM activities they can do for her.      Pertinent Vitals/Pain Pain Assessment Pain Assessment: PAINAD Breathing: normal Negative Vocalization: none Facial Expression: facial  grimacing Body Language: relaxed Consolability: distracted or reassured by voice/touch PAINAD Score: 3 Pain Location: grimacing with R hand ROM Pain Descriptors / Indicators: Grimacing Pain Intervention(s): Monitored during session, Limited activity within patient's tolerance    Home Living                          Prior Function            PT Goals (current goals can now be found in the care plan section) Progress towards PT goals: Not progressing toward goals - comment    Frequency    Min 1X/week      PT Plan Current plan remains appropriate    Co-evaluation              AM-PAC PT "6 Clicks" Mobility   Outcome Measure  Help needed turning from your back to your side while in a flat bed without using bedrails?: Total Help needed moving from lying on your back to sitting on the side of a flat bed without using bedrails?: Total Help needed moving to and from a bed to a chair (including a wheelchair)?: Total Help needed standing up from a chair using your arms (e.g., wheelchair or bedside chair)?: Total Help needed to walk in hospital room?: Total Help needed climbing 3-5 steps with a railing? : Total 6 Click Score: 6    End of Session Equipment Utilized During Treatment: Oxygen Activity Tolerance: Patient tolerated treatment well Patient left: in bed   PT Visit Diagnosis: Difficulty in walking, not elsewhere classified (R26.2);Unsteadiness on feet (R26.81)     Time: [id number] PT Time Calculation (min) (ACUTE ONLY): 25 min  Charges:  $Therapeutic Exercise: 8-22 mins $Therapeutic Activity: 8-22 mins                     Magda Kiel, PT Acute Rehabilitation Services Office:[phone] 02/13/2023    Reginia Naas 02/13/2023, 4:47 PM

## 2023-02-14 DIAGNOSIS — R0902 Hypoxemia: Secondary | ICD-10-CM | POA: Diagnosis not present

## 2023-02-14 DIAGNOSIS — I619 Nontraumatic intracerebral hemorrhage, unspecified: Secondary | ICD-10-CM | POA: Diagnosis not present

## 2023-02-14 DIAGNOSIS — I612 Nontraumatic intracerebral hemorrhage in hemisphere, unspecified: Secondary | ICD-10-CM | POA: Diagnosis not present

## 2023-02-14 MED ORDER — METOPROLOL TARTRATE 25 MG PO TABS
25.0000 mg | ORAL_TABLET | Freq: Two times a day (BID) | ORAL | Status: DC
  Administered 2023-02-14 – 2023-02-27 (×26): 25 mg
  Filled 2023-02-14 (×27): qty 1

## 2023-02-14 NOTE — Progress Notes (Signed)
[facility] PROGRESS NOTE    Progress Note  Pamela Johnston  [id number] DOB: 1940/12/22 DOA: 11/29/2022 PCP: [name], MD     Brief Narrative:   Pamela Johnston is an 82 y.o. 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  Neurology and neurosurgery was consulted she was intubated and admitted to the neuro ICU started on 3% saline palliative care was consulted due to her poor prognosis.  Underwent tracheostomy as well as PEG tube placement she was also started on Keppra for concerns of seizures.  She is currently on trach collar, palliative care has had multiple discussions with the family at 1 point hospital leadership was involved she remains a full code.  Clearly if she could be placed is a month out from her tracheostomy on 01/21/2023 now in the difficult to place list.    Significant Events: 12/21 - Presented to [facility] ED via EMS as Code Stroke. LKW 2000. Abrupt onset of decreased responsiveness, aphasia and R hemiplegia. Stroke admitting, NSGY consulted. PCCM consult for vent management. Treated with unasyn for aspiration pneumonia. CT Head with large L hemispheric parietal/temporal lobe IPH with extension into basal ganglia and IVH. HTS 3% started.   12/24 - Palliative care consulted, family requested meeting to be delayed 12/25 - Family requested goals of care meeting to be delayed 12/26 - Stopped Unasyn, replete phos; [name] GOC conversation: family says full code 12/27 - Restarted Unasyn for fever, RLL infiltrate; resp culture: enterobacter, MDR. Palliative consulted   12/28 - CT Head unchanged 12/29 - Unasyn changed to bactrim 12/31 - Posturing, CT head with increased shift, increased bleeding 1/2 - Family fired CVA service after discussion RE poor prognosis  1/4 - Off sedation  1/5 - No acute issues overnight. Remains on vent with increased respirations off sedation. Fever curve slightly decreased  1/6 - Pall care meeting moved to 2pm today. [name] the son-in-law will be present. On ven 30%/ On TF. Neo not started last night. MAP > 65 after fluids bolus. Febrile +.  Last CT head 12/09/22.  1/8 - No significant neurologic/clinical change. Brief GOC discussion with husband, Pamela Johnston, at bedside in AM. Concerns from staff re: patient GOC/decisionmaking, want to ensure husband is included in decisions/information sharing. Attempted bedside meeting but family prematurely ended meeting due to escalated discussion. 1/9 - Febrile overnight to 101.27F, remains on bromocriptine. No significant neurologic exam changes. 1/10 Family meeting yesterday with [name], plan for trach/peg though husband says they are still discussing this morning. Per Dt. Hulen Skains " So moving forward, Pamela Johnston is the designated medical decision maker for Pamela Johnston " 1/11 no new issues, plan for PEG tomorrow 1/12 for trach/peg today 1/15 No change in Neuro status , trach and PEG, No weaning , will repeat CT head without, get Case management involved in looking for LTAC ( Day 3 post trach on 1/12)   1/15-CT with some stabilization 1/19 No acute events overnight, respiratory culture 1/15 with abundant Pseudomonas, abundant Staphylococcus aureus, and moderate Enterobacter remains on Meropenem  1/22 trach collar trial 12 + hrs 1/24 trach collar x 24 hours on 35% 1/25-  concern for seizures-- keppra started after EEG done 2/12- concern for seizures-- EEG reordered, negative 2/19 -PCCM saw for trach maintenance 2/25: ? Aspiration event with PNA 2/28-worsening fever curves, requiring cooling  blanket, white count started to increase, ID consulted due to multiresistant Pseudomonas.  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  Multiple family meetings has happened over time with palliative care and prior attendings including the [place]. Medical decision making is [name] patient's son-in-law. Remains a full code, stroke team has signed off. She is at high risk of aspiration. She has no chance of recovery.  Fevers: On 12/07/2023 she developed worsening fevers and leukocytosis. Prior respiratory cultures showed Pseudomonas carbapenem resistant. Recent urine culture also showed highly resistant Pseudomonas and pansensitive Enterobacter. ID was consulted recommended 7 days course of Zerbaxa completed on 02/13/2023.  Concerns for seizures: On Keppra underwent EEG on 11/21/2023 due to left-sided rhythmic movements. EEG showed no seizures neurology has now signed off.  Acute respiratory failure with hypoxia due to large intracerebral hemorrhage: Status post trach and PEG on 12/21/2022. PCCM following intermittently.  Urinary retention: Foley placed on 01/01/2023 will need urologic evaluation as an outpatient.  History of chronic sacroiliitis/osteopenia/RLS: Continue supportive care.  Stage I significant results are/right ear ulcer RN Pressure Injury Documentation: Pressure Injury 12/22/22 Ear Right Stage 1 -  Intact skin with non-blanchable redness of a localized area usually over a bony prominence. (Active)  12/22/22 1816  Location: Ear  Location Orientation: Right  Staging: Stage 1 -  Intact skin with non-blanchable redness of a localized area usually over a bony prominence.  Wound  Description (Comments):   Present on Admission: No  Dressing Type Foam - Lift dressing to assess site every shift 02/13/23 0800     Pressure Injury 02/07/23 Coccyx Medial Deep Tissue Pressure Injury - Purple or maroon localized area of discolored intact skin or blood-filled blister due to damage of underlying soft tissue from pressure and/or shear. Deep tissue injury on Coccyx (Active)  02/07/23 1150  Location: Coccyx  Location Orientation: Medial  Staging: Deep Tissue Pressure Injury - Purple or maroon localized area of discolored intact skin or blood-filled blister due to damage of underlying soft tissue from pressure and/or shear.  Wound Description (Comments): Deep tissue injury on Coccyx  Present on Admission:   Dressing Type Foam - Lift dressing to assess site every shift 02/13/23 0800     DVT prophylaxis: scd Family Communication:sin in law Status is: Inpatient Remains inpatient appropriate because: Devastating large left hemispheric parietal infarct    Code Status:     Code Status Orders  (From admission, onward)           Start     Ordered   11/30/22 1451  Full code  Continuous       Question:  By:  Answer:  Consent: discussion documented in EHR   11/30/22 1450           Code Status History     Date Active Date Inactive Code Status Order ID Comments User Context   11/30/2022 0145 11/30/2022 1450 DNR [id number]  Maryjane Hurter, MD ED   11/29/2022 2218 11/30/2022 0144 Full Code [id number]  Greta Doom, MD ED      Advance Directive Documentation    Flowsheet  Row Most Recent Value  Type of Advance Directive Healthcare Power of Attorney  Pre-existing out of facility DNR order (yellow form or pink MOST form) --  "MOST" Form in Place? --         IV Access:   Peripheral IV   Procedures and diagnostic studies:   No results found.   Medical Consultants:   None.   Subjective:    AUBURN ROTE nonverbal  Objective:     Vitals:   02/13/23 1523 02/13/23 1956 02/13/23 2340 02/14/23 0340  BP: (!) 109/50 102/60 127/76 (!) 114/58  Pulse: (!) 106  (!) 101 (!) 105  Resp: (!) 35  (!) 25 (!) 27  Temp: 100.2 F (37.9 C) 99 F (37.2 C) 98.8 F (37.1 C) 99.2 F (37.3 C)  TempSrc: Axillary Axillary Axillary Axillary  SpO2: 94%  98% 94%  Weight:      Height:       SpO2: 94 % O2 Flow Rate (L/min): 6 L/min FiO2 (%): 21 %   Intake/Output Summary (Last 24 hours) at 02/14/2023 0710 Last data filed at 02/14/2023 0541 Gross per 24 hour  Intake --  Output 1645 ml  Net -1645 ml    Filed Weights   02/09/23 0804 02/11/23 0500 02/12/23 0500  Weight: 43 kg 43 kg 43 kg    Exam: General exam: In no acute distress, only responsive to pain Respiratory system: Good air movement and clear to auscultation. Cardiovascular system: S1 & S2 heard, RRR. No JVD,. Gastrointestinal system: Abdomen is nondistended, soft and nontender.  Extremities: No pedal edema. Skin: No rashes, lesions or ulcers Data Reviewed:    Labs: Basic Metabolic Panel: Recent Labs  Lab 02/08/23 0653 02/09/23 0807 02/11/23 0944 02/13/23 0449  NA 142 146* 141 141  K 2.9* 3.7 3.7 4.3  CL 107 114* 109 106  CO2 30 24 21* 25  GLUCOSE 113* 120* 124* 104*  BUN 37* 36* 28* 26*  CREATININE 0.48 0.59 0.41* 0.48  CALCIUM 8.3* 8.2* 8.2* 8.7*  MG 2.2 2.3 2.0 2.2    GFR Estimated Creatinine Clearance: 38.7 mL/min (by C-G formula based on SCr of 0.48 mg/dL). Liver Function Tests: Recent Labs  Lab 02/08/23 0653 02/09/23 0807 02/13/23 0449  AST 57* 46* 36  ALT 57* 48* 23  ALKPHOS 144* 143* 178*  BILITOT 0.6 0.3 0.4  PROT 6.3* 6.0* 5.9*  ALBUMIN 2.1* 2.1* 2.2*    No results for input(s): "LIPASE", "AMYLASE" in the last 168 hours. No results for input(s): "AMMONIA" in the last 168 hours. Coagulation profile No results for input(s): "INR", "PROTIME" in the last 168 hours. COVID-19 Labs  No results for input(s): "DDIMER", "FERRITIN",  "LDH", "CRP" in the last 72 hours.  No results found for: "SARSCOV2NAA"  CBC: Recent Labs  Lab 02/08/23 0653 02/09/23 0807 02/11/23 0944 02/13/23 0619  WBC 8.0 8.3 10.8* 11.5*  HGB 11.1* 10.1* 10.5* 10.6*  HCT 35.3* 32.7* 33.4* 35.1*  MCV 94.6 96.7 96.3 96.7  PLT 263 221 250 280    Cardiac Enzymes: No results for input(s): "CKTOTAL", "CKMB", "CKMBINDEX", "TROPONINI" in the last 168 hours. BNP (last 3 results) No results for input(s): "PROBNP" in the last 8760 hours. CBG: No results for input(s): "GLUCAP" in the last 168 hours. D-Dimer: No results for input(s): "DDIMER" in the last 72 hours. Hgb A1c: No results for input(s): "HGBA1C" in the last 72 hours. Lipid Profile: No results for input(s): "CHOL", "HDL", "LDLCALC", "TRIG", "CHOLHDL", "LDLDIRECT" in the last 72  hours. Thyroid function studies: No results for input(s): "TSH", "T4TOTAL", "T3FREE", "THYROIDAB" in the last 72 hours.  Invalid input(s): "FREET3" Anemia work up: No results for input(s): "VITAMINB12", "FOLATE", "FERRITIN", "TIBC", "IRON", "RETICCTPCT" in the last 72 hours. Sepsis Labs: Recent Labs  Lab 02/08/23 0653 02/09/23 0807 02/11/23 0944 02/13/23 0619  WBC 8.0 8.3 10.8* 11.5*    Microbiology No results found for this or any previous visit (from the past 240 hour(s)).   Medications:    Chlorhexidine Gluconate Cloth  6 each Topical Daily   famotidine  20 mg Per Tube QHS   feeding supplement (PROSource TF20)  60 mL Per Tube Daily   fiber supplement (BANATROL TF)  60 mL Per Tube BID   Gerhardt's butt cream   Topical QID   latanoprost  1 drop Both Eyes QHS   levETIRAcetam  500 mg Per Tube BID   metoprolol tartrate  12.5 mg Per Tube BID   nutrition supplement (JUVEN)  1 packet Per Tube BID BM   mouth rinse  15 mL Mouth Rinse Q2H   phenylephrine-shark liver oil-mineral oil-petrolatum  1 Application Rectal BID   rosuvastatin  20 mg Per Tube Daily   Continuous Infusions:  sodium chloride  Stopped (01/01/23 0424)   feeding supplement (JEVITY 1.2 CAL) 1,000 mL (02/13/23 2000)      LOS: 56 days   Charlynne Cousins  [facility]  02/14/2023, 7:10 AM

## 2023-02-15 DIAGNOSIS — I619 Nontraumatic intracerebral hemorrhage, unspecified: Secondary | ICD-10-CM | POA: Diagnosis not present

## 2023-02-15 DIAGNOSIS — R0902 Hypoxemia: Secondary | ICD-10-CM | POA: Diagnosis not present

## 2023-02-15 DIAGNOSIS — I612 Nontraumatic intracerebral hemorrhage in hemisphere, unspecified: Secondary | ICD-10-CM | POA: Diagnosis not present

## 2023-02-15 LAB — GLUCOSE, CAPILLARY: Glucose-Capillary: 139 mg/dL — ABNORMAL HIGH (ref 70–99)

## 2023-02-15 LAB — CBC WITH DIFFERENTIAL/PLATELET
Abs Immature Granulocytes: 0.03 10*3/uL (ref 0.00–0.07)
Basophils Absolute: 0 10*3/uL (ref 0.0–0.1)
Basophils Relative: 1 %
Eosinophils Absolute: 0.7 10*3/uL — ABNORMAL HIGH (ref 0.0–0.5)
Eosinophils Relative: 8 %
HCT: 36.8 % (ref 36.0–46.0)
Hemoglobin: 11.4 g/dL — ABNORMAL LOW (ref 12.0–15.0)
Immature Granulocytes: 0 %
Lymphocytes Relative: 29 %
Lymphs Abs: 2.5 10*3/uL (ref 0.7–4.0)
MCH: 30.1 pg (ref 26.0–34.0)
MCHC: 31 g/dL (ref 30.0–36.0)
MCV: 97.1 fL (ref 80.0–100.0)
Monocytes Absolute: 0.6 10*3/uL (ref 0.1–1.0)
Monocytes Relative: 7 %
Neutro Abs: 4.9 10*3/uL (ref 1.7–7.7)
Neutrophils Relative %: 55 %
Platelets: 269 10*3/uL (ref 150–400)
RBC: 3.79 MIL/uL — ABNORMAL LOW (ref 3.87–5.11)
RDW: 14.8 % (ref 11.5–15.5)
WBC: 8.8 10*3/uL (ref 4.0–10.5)
nRBC: 0 % (ref 0.0–0.2)

## 2023-02-15 NOTE — Progress Notes (Signed)
[facility] PROGRESS NOTE    Progress Note  [name]  [id number] DOB: 1941/08/29 DOA: 11/29/2022 PCP: [name], MD     Brief Narrative:   Pamela Johnston is an 82 y.o. 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  Neurology and neurosurgery was consulted she was intubated and admitted to the neuro ICU started on 3% saline palliative care was consulted due to her poor prognosis.  Underwent tracheostomy as well as PEG tube placement she was also started on Keppra for concerns of seizures.  She is currently on trach collar, palliative care has had multiple discussions with the family at 1 point hospital leadership was involved she remains a full code.  Clearly if she could be placed is a month out from her tracheostomy on 01/21/2023, now in the difficult to place list.    Significant Events: 12/21 - Presented to [facility] ED via EMS as Code Stroke. LKW 2000. Abrupt onset of decreased responsiveness, aphasia and R hemiplegia. Stroke admitting, NSGY consulted. PCCM consult for vent management. Treated with unasyn for aspiration pneumonia. CT Head with large L hemispheric parietal/temporal lobe IPH with extension into basal ganglia and IVH. HTS 3% started.   12/24 - Palliative care consulted, family requested meeting to be delayed 12/25 - Family requested goals of care meeting to be delayed 12/26 - Stopped Unasyn, replete phos; [name] GOC conversation: family says full code 12/27 - Restarted Unasyn for fever, RLL infiltrate; resp culture: enterobacter, MDR. Palliative consulted   12/28 - CT Head unchanged 12/29 - Unasyn changed to bactrim 12/31 - Posturing, CT head with increased shift, increased bleeding 1/2 - Family fired CVA service after discussion RE poor prognosis  1/4 - Off sedation  1/5 - No acute issues overnight. Remains on vent with increased respirations off sedation. Fever curve slightly decreased  1/6 - Pall care meeting moved to 2pm today. [name] the son-in-law will be present. On ven 30%/ On TF. Neo not started last night. MAP > 65 after fluids bolus. Febrile +.  Last CT head 12/09/22.  1/8 - No significant neurologic/clinical change. Brief GOC discussion with husband, [name], at bedside in AM. Concerns from staff re: patient GOC/decisionmaking, want to ensure husband is included in decisions/information sharing. Attempted bedside meeting but family prematurely ended meeting due to escalated discussion. 1/9 - Febrile overnight to 101.33F, remains on bromocriptine. No significant neurologic exam changes. 1/10 Family meeting yesterday with [name], plan for trach/peg though husband says they are still discussing this morning. Per Dt. Hulen Skains " So moving forward, [name] is the designated medical decision maker for [name] " 1/11 no new issues, plan for PEG tomorrow 1/12 for trach/peg today 1/15 No change in Neuro status , trach and PEG, No weaning , will repeat CT head without, get Case management involved in looking for LTAC ( Day 3 post trach on 1/12)   1/15-CT with some stabilization 1/19 No acute events overnight, respiratory culture 1/15 with abundant Pseudomonas, abundant Staphylococcus aureus, and moderate Enterobacter remains on Meropenem  1/22 trach collar trial 12 + hrs 1/24 trach collar x 24 hours on 35% 1/25-  concern for seizures-- keppra started after EEG done 2/12- concern for seizures-- EEG reordered, negative 2/19 -PCCM saw for trach maintenance 2/25: ? Aspiration event with PNA 2/28-worsening fever curves, requiring cooling  blanket, white count started to increase, ID consulted due to multiresistant Pseudomonas.  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  Multiple family meetings has happened over time with palliative care and prior attendings including the [place]. Medical decision making is [name] patient's son-in-law. Remains a full code, stroke team has signed off. She is at high risk of aspiration. She has no chance of recovery.  Fevers: On 12/07/2023 she developed worsening fevers and leukocytosis. Prior respiratory cultures showed Pseudomonas carbapenem resistant. Recent urine culture also showed highly resistant Pseudomonas and pansensitive Enterobacter. ID was consulted recommended 7 days course of Zerbaxa completed on 02/13/2023. Tmax one 100.3, check a CBC with differential, continue to monitor fever curve.  Concerns for seizures: On Keppra underwent EEG on 11/21/2023 due to left-sided rhythmic movements. EEG showed no seizures neurology has now signed off. No events.  Acute respiratory failure with hypoxia due to large intracerebral hemorrhage: Status post trach and PEG on 12/21/2022. PCCM following intermittently. Acute satting greater 90% on FiO2 of 20% with a flow rate of 6 L/min.  Urinary retention: Foley placed on 01/01/2023 will need urologic evaluation as an outpatient.  History of chronic sacroiliitis/osteopenia/RLS: Continue supportive care.  Stage I significant results are/right ear ulcer RN Pressure Injury Documentation: Pressure Injury 12/22/22 Ear Right Stage 1 -  Intact skin with non-blanchable redness of a localized area usually over a bony prominence. (Active)  12/22/22 1816   Location: Ear  Location Orientation: Right  Staging: Stage 1 -  Intact skin with non-blanchable redness of a localized area usually over a bony prominence.  Wound Description (Comments):   Present on Admission: No  Dressing Type Foam - Lift dressing to assess site every shift 02/14/23 2020     Pressure Injury 02/07/23 Coccyx Medial Deep Tissue Pressure Injury - Purple or maroon localized area of discolored intact skin or blood-filled blister due to damage of underlying soft tissue from pressure and/or shear. Deep tissue injury on Coccyx (Active)  02/07/23 1150  Location: Coccyx  Location Orientation: Medial  Staging: Deep Tissue Pressure Injury - Purple or maroon localized area of discolored intact skin or blood-filled blister due to damage of underlying soft tissue from pressure and/or shear.  Wound Description (Comments): Deep tissue injury on Coccyx  Present on Admission:   Dressing Type Foam - Lift dressing to assess site every shift 02/14/23 2020     DVT prophylaxis: scd Family Communication:sin in law Status is: Inpatient Remains inpatient appropriate because: Devastating large left hemispheric parietal infarct    Code Status:     Code Status Orders  (From admission, onward)           Start     Ordered   11/30/22 1451  Full code  Continuous       Question:  By:  Answer:  Consent: discussion documented in EHR   11/30/22 1450           Code Status History     Date Active Date Inactive Code Status Order ID Comments User Context   11/30/2022 0145 11/30/2022 1450 DNR [id number]  Leslye Peer  M, MD ED   11/29/2022 2218 11/30/2022 0144 Full Code [id number]  Greta Doom, MD ED      Advance Directive Documentation    Flowsheet Row Most Recent Value  Type of Advance Directive Healthcare Power of Attorney  Pre-existing out of facility DNR order (yellow form or pink MOST form) --  "MOST" Form in Place? --         IV Access:   Peripheral  IV   Procedures and diagnostic studies:   No results found.   Medical Consultants:   None.   Subjective:    KAIDYN MIETUS nonverbal  Objective:    Vitals:   02/14/23 1800 02/14/23 2000 02/14/23 2344 02/15/23 0400  BP:  120/65 (!) 118/58 122/66  Pulse: 95     Resp: (!) 33     Temp:  100.3 F (37.9 C) 100.1 F (37.8 C) 99.1 F (37.3 C)  TempSrc:  Axillary Axillary Axillary  SpO2: 94%  96% 97%  Weight:      Height:       SpO2: 97 % O2 Flow Rate (L/min): 6 L/min FiO2 (%): 21 %   Intake/Output Summary (Last 24 hours) at 02/15/2023 0755 Last data filed at 02/14/2023 1900 Gross per 24 hour  Intake 660 ml  Output 750 ml  Net -90 ml    Filed Weights   02/09/23 0804 02/11/23 0500 02/12/23 0500  Weight: 43 kg 43 kg 43 kg    Exam: General exam: In no acute distress, only responsive to pain Respiratory system: Good air movement and clear to auscultation. Cardiovascular system: S1 & S2 heard, RRR. No JVD,. Gastrointestinal system: Abdomen is nondistended, soft and nontender.  Extremities: No pedal edema. Skin: No rashes, lesions or ulcers Data Reviewed:    Labs: Basic Metabolic Panel: Recent Labs  Lab 02/09/23 0807 02/11/23 0944 02/13/23 0449  NA 146* 141 141  K 3.7 3.7 4.3  CL 114* 109 106  CO2 24 21* 25  GLUCOSE 120* 124* 104*  BUN 36* 28* 26*  CREATININE 0.59 0.41* 0.48  CALCIUM 8.2* 8.2* 8.7*  MG 2.3 2.0 2.2    GFR Estimated Creatinine Clearance: 38.7 mL/min (by C-G formula based on SCr of 0.48 mg/dL). Liver Function Tests: Recent Labs  Lab 02/09/23 0807 02/13/23 0449  AST 46* 36  ALT 48* 23  ALKPHOS 143* 178*  BILITOT 0.3 0.4  PROT 6.0* 5.9*  ALBUMIN 2.1* 2.2*    No results for input(s): "LIPASE", "AMYLASE" in the last 168 hours. No results for input(s): "AMMONIA" in the last 168 hours. Coagulation profile No results for input(s): "INR", "PROTIME" in the last 168 hours. COVID-19 Labs  No results for input(s): "DDIMER",  "FERRITIN", "LDH", "CRP" in the last 72 hours.  No results found for: "SARSCOV2NAA"  CBC: Recent Labs  Lab 02/09/23 0807 02/11/23 0944 02/13/23 0619  WBC 8.3 10.8* 11.5*  HGB 10.1* 10.5* 10.6*  HCT 32.7* 33.4* 35.1*  MCV 96.7 96.3 96.7  PLT 221 250 280    Cardiac Enzymes: No results for input(s): "CKTOTAL", "CKMB", "CKMBINDEX", "TROPONINI" in the last 168 hours. BNP (last 3 results) No results for input(s): "PROBNP" in the last 8760 hours. CBG: No results for input(s): "GLUCAP" in the last 168 hours. D-Dimer: No results for input(s): "DDIMER" in the last 72 hours. Hgb A1c: No results for input(s): "HGBA1C" in the last 72 hours. Lipid Profile: No results for input(s): "CHOL", "HDL", "LDLCALC", "TRIG", "CHOLHDL", "LDLDIRECT" in the last 72 hours. Thyroid function studies:  No results for input(s): "TSH", "T4TOTAL", "T3FREE", "THYROIDAB" in the last 72 hours.  Invalid input(s): "FREET3" Anemia work up: No results for input(s): "VITAMINB12", "FOLATE", "FERRITIN", "TIBC", "IRON", "RETICCTPCT" in the last 72 hours. Sepsis Labs: Recent Labs  Lab 02/09/23 0807 02/11/23 0944 02/13/23 0619  WBC 8.3 10.8* 11.5*    Microbiology No results found for this or any previous visit (from the past 240 hour(s)).   Medications:    Chlorhexidine Gluconate Cloth  6 each Topical Daily   famotidine  20 mg Per Tube QHS   feeding supplement (PROSource TF20)  60 mL Per Tube Daily   fiber supplement (BANATROL TF)  60 mL Per Tube BID   Gerhardt's butt cream   Topical QID   latanoprost  1 drop Both Eyes QHS   levETIRAcetam  500 mg Per Tube BID   metoprolol tartrate  25 mg Per Tube BID   nutrition supplement (JUVEN)  1 packet Per Tube BID BM   mouth rinse  15 mL Mouth Rinse Q2H   phenylephrine-shark liver oil-mineral oil-petrolatum  1 Application Rectal BID   rosuvastatin  20 mg Per Tube Daily   Continuous Infusions:  sodium chloride Stopped (01/01/23 0424)   feeding supplement (JEVITY  1.2 CAL) 55 mL/hr at 02/14/23 1900      LOS: 32 days   Charlynne Cousins  [facility]  02/15/2023, 7:55 AM

## 2023-02-16 ENCOUNTER — Inpatient Hospital Stay (HOSPITAL_COMMUNITY): Payer: Medicare PPO

## 2023-02-16 DIAGNOSIS — I612 Nontraumatic intracerebral hemorrhage in hemisphere, unspecified: Secondary | ICD-10-CM | POA: Diagnosis not present

## 2023-02-16 DIAGNOSIS — R0902 Hypoxemia: Secondary | ICD-10-CM | POA: Diagnosis not present

## 2023-02-16 DIAGNOSIS — I619 Nontraumatic intracerebral hemorrhage, unspecified: Secondary | ICD-10-CM | POA: Diagnosis not present

## 2023-02-16 NOTE — Progress Notes (Signed)
Notified of increasing oxygen requirements. Plan to check CXR.

## 2023-02-16 NOTE — Progress Notes (Signed)
CSW received bed offer from [facility] in [place].  CSW initiated insurance authorization.  Madilyn Fireman, MSW, LCSW Transitions of Care  Clinical Social Worker II [id number]

## 2023-02-16 NOTE — Progress Notes (Signed)
[facility] PROGRESS NOTE    Progress Note  [name]  [id number] DOB: 1941/08/29 DOA: 11/29/2022 PCP: [name], MD     Brief Narrative:   Pamela Johnston is an 82 y.o. 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  Neurology and neurosurgery was consulted she was intubated and admitted to the neuro ICU started on 3% saline palliative care was consulted due to her poor prognosis.  Underwent tracheostomy as well as PEG tube placement she was also started on Keppra for concerns of seizures.  She is currently on trach collar, palliative care has had multiple discussions with the family at 1 point hospital leadership was involved she remains a full code.  Clearly if she could be placed is a month out from her tracheostomy on 01/21/2023, now in the difficult to place list.    Significant Events: 12/21 - Presented to [facility] ED via EMS as Code Stroke. LKW 2000. Abrupt onset of decreased responsiveness, aphasia and R hemiplegia. Stroke admitting, NSGY consulted. PCCM consult for vent management. Treated with unasyn for aspiration pneumonia. CT Head with large L hemispheric parietal/temporal lobe IPH with extension into basal ganglia and IVH. HTS 3% started.   12/24 - Palliative care consulted, family requested meeting to be delayed 12/25 - Family requested goals of care meeting to be delayed 12/26 - Stopped Unasyn, replete phos; [name] GOC conversation: family says full code 12/27 - Restarted Unasyn for fever, RLL infiltrate; resp culture: enterobacter, MDR. Palliative consulted   12/28 - CT Head unchanged 12/29 - Unasyn changed to bactrim 12/31 - Posturing, CT head with increased shift, increased bleeding 1/2 - Family fired CVA service after discussion RE poor prognosis  1/4 - Off sedation  1/5 - No acute issues overnight. Remains on vent with increased respirations off sedation. Fever curve slightly decreased  1/6 - Pall care meeting moved to 2pm today. [name] the son-in-law will be present. On ven 30%/ On TF. Neo not started last night. MAP > 65 after fluids bolus. Febrile +.  Last CT head 12/09/22.  1/8 - No significant neurologic/clinical change. Brief GOC discussion with husband, [name], at bedside in AM. Concerns from staff re: patient GOC/decisionmaking, want to ensure husband is included in decisions/information sharing. Attempted bedside meeting but family prematurely ended meeting due to escalated discussion. 1/9 - Febrile overnight to 101.33F, remains on bromocriptine. No significant neurologic exam changes. 1/10 Family meeting yesterday with [name], plan for trach/peg though husband says they are still discussing this morning. Per Dt. Hulen Skains " So moving forward, [name] is the designated medical decision maker for [name] " 1/11 no new issues, plan for PEG tomorrow 1/12 for trach/peg today 1/15 No change in Neuro status , trach and PEG, No weaning , will repeat CT head without, get Case management involved in looking for LTAC ( Day 3 post trach on 1/12)   1/15-CT with some stabilization 1/19 No acute events overnight, respiratory culture 1/15 with abundant Pseudomonas, abundant Staphylococcus aureus, and moderate Enterobacter remains on Meropenem  1/22 trach collar trial 12 + hrs 1/24 trach collar x 24 hours on 35% 1/25-  concern for seizures-- keppra started after EEG done 2/12- concern for seizures-- EEG reordered, negative 2/19 -PCCM saw for trach maintenance 2/25: ? Aspiration event with PNA 2/28-worsening fever curves, requiring cooling  blanket, white count started to increase, ID consulted due to multiresistant Pseudomonas.  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  Multiple family meetings have happened over time with palliative care and prior attendings including the [facility]. Medical decision making is [name] patient's son-in-law. Remains a full code, stroke team has signed off. She is at high risk of aspiration. She has no chance of recovery. TOC has been consulted for LTAC placement.  Fevers: On 12/07/2023 she developed worsening fevers and leukocytosis. Prior respiratory cultures showed Pseudomonas carbapenem resistant. Recent urine culture also showed highly resistant Pseudomonas and pansensitive Enterobacter. ID was consulted recommended 7 days course of Zerbaxa completed on 02/13/2023. Tmax of 98.8, leukocytosis has improved.  Concerns for seizures: On Keppra underwent EEG on 11/21/2023 due to left-sided rhythmic movements. EEG showed no seizures neurology has now signed off. No events.  Acute respiratory failure with hypoxia due to large intracerebral hemorrhage: Status post trach and PEG on 12/21/2022. PCCM following intermittently. Acute satting greater 90% on FiO2 of 28% with a flow rate of 6 L/min.  Urinary retention: Foley placed on 01/01/2023 will need urologic evaluation as an outpatient.  History of chronic sacroiliitis/osteopenia/RLS: Continue supportive care.  Stage I significant results are/right ear ulcer RN Pressure Injury Documentation: Pressure Injury 12/22/22 Ear Right Stage 1 -  Intact skin with non-blanchable redness of a localized area usually over a bony prominence. (Active)  12/22/22 1816   Location: Ear  Location Orientation: Right  Staging: Stage 1 -  Intact skin with non-blanchable redness of a localized area usually over a bony prominence.  Wound Description (Comments):   Present on Admission: No  Dressing Type Foam - Lift dressing to assess site every shift 02/15/23 2030     Pressure Injury 02/07/23 Coccyx Medial Deep Tissue Pressure Injury - Purple or maroon localized area of discolored intact skin or blood-filled blister due to damage of underlying soft tissue from pressure and/or shear. Deep tissue injury on Coccyx (Active)  02/07/23 1150  Location: Coccyx  Location Orientation: Medial  Staging: Deep Tissue Pressure Injury - Purple or maroon localized area of discolored intact skin or blood-filled blister due to damage of underlying soft tissue from pressure and/or shear.  Wound Description (Comments): Deep tissue injury on Coccyx  Present on Admission:   Dressing Type Foam - Lift dressing to assess site every shift 02/15/23 2030     DVT prophylaxis: scd Family Communication:sin in law Status is: Inpatient Remains inpatient appropriate because: Devastating large left hemispheric parietal infarct    Code Status:     Code Status Orders  (From admission, onward)           Start     Ordered   11/30/22 1451  Full code  Continuous       Question:  By:  Answer:  Consent: discussion documented in EHR   11/30/22 1450           Code Status History     Date Active Date Inactive Code Status Order ID Comments User Context   11/30/2022 0145 11/30/2022 1450 DNR [id number]  Leslye Peer  M, MD ED   11/29/2022 2218 11/30/2022 0144 Full Code [id number]  Greta Doom, MD ED      Advance Directive Documentation    Flowsheet Row Most Recent Value  Type of Advance Directive Healthcare Power of Attorney  Pre-existing out of facility DNR order (yellow form or pink MOST form) --  "MOST" Form in Place? --         IV Access:   Peripheral  IV   Procedures and diagnostic studies:   No results found.   Medical Consultants:   None.   Subjective:    AYAK OLLER nonverbal.  Objective:    Vitals:   02/15/23 1529 02/15/23 1914 02/15/23 2353 02/16/23 0307  BP:  124/67 (!) 113/54 (!) 103/55  Pulse: (!) 102 (!) 108 91 92  Resp: (!) 29 (!) 23 (!) 23 (!) 23  Temp:  98.6 F (37 C) 98.8 F (37.1 C) 98.8 F (37.1 C)  TempSrc:  Oral Oral Axillary  SpO2: 95% 96% 94% 96%  Weight:      Height:       SpO2: 96 % O2 Flow Rate (L/min): 6 L/min FiO2 (%): 28 %   Intake/Output Summary (Last 24 hours) at 02/16/2023 0738 Last data filed at 02/16/2023 0537 Gross per 24 hour  Intake 1445 ml  Output 1750 ml  Net -305 ml    Filed Weights   02/09/23 0804 02/11/23 0500 02/12/23 0500  Weight: 43 kg 43 kg 43 kg    Exam: General exam: In no acute distress, only responsive to pain Respiratory system: Good air movement and clear to auscultation. Cardiovascular system: S1 & S2 heard, RRR. No JVD,. Gastrointestinal system: Abdomen is nondistended, soft and nontender.  Extremities: No pedal edema. Skin: No rashes, lesions or ulcers Data Reviewed:    Labs: Basic Metabolic Panel: Recent Labs  Lab 02/09/23 0807 02/11/23 0944 02/13/23 0449  NA 146* 141 141  K 3.7 3.7 4.3  CL 114* 109 106  CO2 24 21* 25  GLUCOSE 120* 124* 104*  BUN 36* 28* 26*  CREATININE 0.59 0.41* 0.48  CALCIUM 8.2* 8.2* 8.7*  MG 2.3 2.0 2.2    GFR Estimated Creatinine Clearance: 38.7 mL/min (by C-G formula based on SCr of 0.48 mg/dL). Liver Function Tests: Recent Labs  Lab 02/09/23 0807 02/13/23 0449  AST 46* 36  ALT 48* 23  ALKPHOS 143* 178*  BILITOT 0.3 0.4  PROT 6.0* 5.9*  ALBUMIN 2.1* 2.2*    No results for input(s): "LIPASE", "AMYLASE" in the last 168 hours. No results for input(s): "AMMONIA" in the last 168 hours. Coagulation profile No results for input(s): "INR", "PROTIME" in the last 168 hours. COVID-19 Labs  No results  for input(s): "DDIMER", "FERRITIN", "LDH", "CRP" in the last 72 hours.  No results found for: "SARSCOV2NAA"  CBC: Recent Labs  Lab 02/09/23 0807 02/11/23 0944 02/13/23 0619 02/15/23 0813  WBC 8.3 10.8* 11.5* 8.8  NEUTROABS  --   --   --  4.9  HGB 10.1* 10.5* 10.6* 11.4*  HCT 32.7* 33.4* 35.1* 36.8  MCV 96.7 96.3 96.7 97.1  PLT 221 250 280 269    Cardiac Enzymes: No results for input(s): "CKTOTAL", "CKMB", "CKMBINDEX", "TROPONINI" in the last 168 hours. BNP (last 3 results) No results for input(s): "PROBNP" in the last 8760 hours. CBG: No results for input(s): "GLUCAP" in the last 168 hours. D-Dimer: No results for input(s): "DDIMER" in the last 72 hours. Hgb A1c: No results for input(s): "HGBA1C"  in the last 72 hours. Lipid Profile: No results for input(s): "CHOL", "HDL", "LDLCALC", "TRIG", "CHOLHDL", "LDLDIRECT" in the last 72 hours. Thyroid function studies: No results for input(s): "TSH", "T4TOTAL", "T3FREE", "THYROIDAB" in the last 72 hours.  Invalid input(s): "FREET3" Anemia work up: No results for input(s): "VITAMINB12", "FOLATE", "FERRITIN", "TIBC", "IRON", "RETICCTPCT" in the last 72 hours. Sepsis Labs: Recent Labs  Lab 02/09/23 0807 02/11/23 0944 02/13/23 0619 02/15/23 0813  WBC 8.3 10.8* 11.5* 8.8    Microbiology No results found for this or any previous visit (from the past 240 hour(s)).   Medications:    Chlorhexidine Gluconate Cloth  6 each Topical Daily   famotidine  20 mg Per Tube QHS   feeding supplement (PROSource TF20)  60 mL Per Tube Daily   fiber supplement (BANATROL TF)  60 mL Per Tube BID   Gerhardt's butt cream   Topical QID   latanoprost  1 drop Both Eyes QHS   levETIRAcetam  500 mg Per Tube BID   metoprolol tartrate  25 mg Per Tube BID   nutrition supplement (JUVEN)  1 packet Per Tube BID BM   mouth rinse  15 mL Mouth Rinse Q2H   phenylephrine-shark liver oil-mineral oil-petrolatum  1 Application Rectal BID   rosuvastatin  20 mg  Per Tube Daily   Continuous Infusions:  sodium chloride Stopped (01/01/23 0424)   feeding supplement (JEVITY 1.2 CAL) 55 mL/hr at 02/15/23 1800      LOS: 35 days   Charlynne Cousins  [facility]  02/16/2023, 7:38 AM

## 2023-02-16 NOTE — Care Management (Signed)
This RN noticed a possible small stg 1 on patients coccyx. Border foam was placed. MD Aileen Fass made aware. Wound consult ordered.

## 2023-02-17 DIAGNOSIS — I619 Nontraumatic intracerebral hemorrhage, unspecified: Secondary | ICD-10-CM | POA: Diagnosis not present

## 2023-02-17 DIAGNOSIS — R0902 Hypoxemia: Secondary | ICD-10-CM | POA: Diagnosis not present

## 2023-02-17 DIAGNOSIS — I612 Nontraumatic intracerebral hemorrhage in hemisphere, unspecified: Secondary | ICD-10-CM | POA: Diagnosis not present

## 2023-02-17 LAB — CBC WITH DIFFERENTIAL/PLATELET
Abs Immature Granulocytes: 0.02 10*3/uL (ref 0.00–0.07)
Basophils Absolute: 0 10*3/uL (ref 0.0–0.1)
Basophils Relative: 0 %
Eosinophils Absolute: 0.6 10*3/uL — ABNORMAL HIGH (ref 0.0–0.5)
Eosinophils Relative: 8 %
HCT: 38.7 % (ref 36.0–46.0)
Hemoglobin: 11.9 g/dL — ABNORMAL LOW (ref 12.0–15.0)
Immature Granulocytes: 0 %
Lymphocytes Relative: 24 %
Lymphs Abs: 2 10*3/uL (ref 0.7–4.0)
MCH: 29.3 pg (ref 26.0–34.0)
MCHC: 30.7 g/dL (ref 30.0–36.0)
MCV: 95.3 fL (ref 80.0–100.0)
Monocytes Absolute: 0.6 10*3/uL (ref 0.1–1.0)
Monocytes Relative: 7 %
Neutro Abs: 4.9 10*3/uL (ref 1.7–7.7)
Neutrophils Relative %: 61 %
Platelets: 306 10*3/uL (ref 150–400)
RBC: 4.06 MIL/uL (ref 3.87–5.11)
RDW: 14.8 % (ref 11.5–15.5)
WBC: 8.1 10*3/uL (ref 4.0–10.5)
nRBC: 0 % (ref 0.0–0.2)

## 2023-02-17 NOTE — Progress Notes (Signed)
Patient's insurance authorization is still pending at this time.  CSW has not presented bed offer from [facility] to patient's family yet - offer will be presented once authorization is obtained.  Madilyn Fireman, MSW, LCSW Transitions of Care  Clinical Social Worker II [phone]

## 2023-02-17 NOTE — Progress Notes (Signed)
[facility] PROGRESS NOTE    Progress Note  Pamela Johnston  [id number] DOB: 1941/08/29 DOA: 11/29/2022 PCP: [name], MD     Brief Narrative:   Pamela Johnston is an 82 y.o. 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  Neurology and neurosurgery was consulted she was intubated and admitted to the neuro ICU started on 3% saline palliative care was consulted due to her poor prognosis.  Underwent tracheostomy as well as PEG tube placement she was also started on Keppra for concerns of seizures.  She is currently on trach collar, palliative care has had multiple discussions with the family at 1 point hospital leadership was involved she remains a full code.  Clearly if she could be placed is a month out from her tracheostomy on 01/21/2023, now in the difficult to place list.    Significant Events: 12/21 - Presented to [facility] ED via EMS as Code Stroke. LKW 2000. Abrupt onset of decreased responsiveness, aphasia and R hemiplegia. Stroke admitting, NSGY consulted. PCCM consult for vent management. Treated with unasyn for aspiration pneumonia. CT Head with large L hemispheric parietal/temporal lobe IPH with extension into basal ganglia and IVH. HTS 3% started.   12/24 - Palliative care consulted, family requested meeting to be delayed 12/25 - Family requested goals of care meeting to be delayed 12/26 - Stopped Unasyn, replete phos; [name] GOC conversation: family says full code 12/27 - Restarted Unasyn for fever, RLL infiltrate; resp culture: enterobacter, MDR. Palliative consulted   12/28 - CT Head unchanged 12/29 - Unasyn changed to bactrim 12/31 - Posturing, CT head with increased shift, increased bleeding 1/2 - Family fired CVA service after discussion RE poor prognosis  1/4 - Off sedation  1/5 - No acute issues overnight. Remains on vent with increased respirations off sedation. Fever curve slightly decreased  1/6 - Pall care meeting moved to 2pm today. Pamela Johnston the son-in-law will be present. On ven 30%/ On TF. Neo not started last night. MAP > 65 after fluids bolus. Febrile +.  Last CT head 12/09/22.  1/8 - No significant neurologic/clinical change. Brief GOC discussion with husband, Pamela Johnston, at bedside in AM. Concerns from staff re: patient GOC/decisionmaking, want to ensure husband is included in decisions/information sharing. Attempted bedside meeting but family prematurely ended meeting due to escalated discussion. 1/9 - Febrile overnight to 101.33F, remains on bromocriptine. No significant neurologic exam changes. 1/10 Family meeting yesterday with [name], plan for trach/peg though husband says they are still discussing this morning. Per Dt. Hulen Skains " So moving forward, Pamela Johnston is the designated medical decision maker for Pamela Johnston " 1/11 no new issues, plan for PEG tomorrow 1/12 for trach/peg today 1/15 No change in Neuro status , trach and PEG, No weaning , will repeat CT head without, get Case management involved in looking for LTAC ( Day 3 post trach on 1/12)   1/15-CT with some stabilization 1/19 No acute events overnight, respiratory culture 1/15 with abundant Pseudomonas, abundant Staphylococcus aureus, and moderate Enterobacter remains on Meropenem  1/22 trach collar trial 12 + hrs 1/24 trach collar x 24 hours on 35% 1/25-  concern for seizures-- keppra started after EEG done 2/12- concern for seizures-- EEG reordered, negative 2/19 -PCCM saw for trach maintenance 2/25: ? Aspiration event with PNA 2/28-worsening fever curves, requiring cooling  blanket, white count started to increase, ID consulted due to multiresistant Pseudomonas.  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  Multiple family meetings have happened over time with palliative care and prior attendings including the [place]. Medical decision making is Pamela Johnston patient's son-in-law. Remains a full code, stroke team has signed off. She is at high risk of aspiration. She has no chance of recovery. TOC has been consulted for LTAC placement.  Awaiting insurance authorization.  Fevers: On 12/07/2023 she developed worsening fevers and leukocytosis. Prior respiratory cultures showed Pseudomonas carbapenem resistant. Recent urine culture also showed highly resistant Pseudomonas and pansensitive Enterobacter. ID was consulted recommended 7 days course of Zerbaxa completed on 02/13/2023. Tmax of 98.8, leukocytosis has improved.  Concerns for seizures: On Keppra underwent EEG on 11/21/2023 due to left-sided rhythmic movements. EEG showed no seizures neurology has now signed off. No events.  Acute respiratory failure with hypoxia due to large intracerebral hemorrhage: Status post trach and PEG on 12/21/2022. PCCM following intermittently. Currently satting at 100% she had an increased oxygen requirements with FiO2 now at 30% with a flow rate of 12. Chest x-rays showed long lung volumes, possible atelectasis.  Tmax 100.2. Recheck a CBC tomorrow morning.  Urinary retention: Foley placed on 01/01/2023 will need urologic evaluation as an outpatient.  History of chronic sacroiliitis/osteopenia/RLS: Continue supportive care.  Stage I significant results are/right ear ulcer RN Pressure  Injury Documentation: Pressure Injury 12/22/22 Ear Right Stage 1 -  Intact skin with non-blanchable redness of a localized area usually over a bony prominence. (Active)  12/22/22 1816  Location: Ear  Location Orientation: Right  Staging: Stage 1 -  Intact skin with non-blanchable redness of a localized area usually over a bony prominence.  Wound Description (Comments):   Present on Admission: No  Dressing Type Foam - Lift dressing to assess site every shift 02/15/23 2030     Pressure Injury 02/16/23 Coccyx Medial Stage 1 -  Intact skin with non-blanchable redness of a localized area usually over a bony prominence. (Active)  02/16/23 0800  Location: Coccyx  Location Orientation: Medial  Staging: Stage 1 -  Intact skin with non-blanchable redness of a localized area usually over a bony prominence.  Wound Description (Comments):   Present on Admission:   Dressing Type Foam - Lift dressing to assess site every shift 02/16/23 2000     DVT prophylaxis: scd Family Communication:sin in law Status is: Inpatient Remains inpatient appropriate because: Devastating large left hemispheric parietal infarct    Code Status:     Code Status Orders  (From admission, onward)           Start     Ordered   11/30/22 1451  Full code  Continuous       Question:  By:  Answer:  Consent: discussion documented in EHR   11/30/22 1450           Code Status History     Date Active Date Inactive Code Status Order ID Comments User Context   11/30/2022 0145 11/30/2022 1450 DNR [id number]  Maryjane Hurter, MD ED  11/29/2022 2218 11/30/2022 0144 Full Code [id number]  Greta Doom, MD ED      Advance Directive Documentation    [place] Most Recent Value  Type of Advance Directive Healthcare Power of [place]  Pre-existing out of facility DNR order (yellow form or pink MOST form) --  "MOST" Form in Place? --         IV Access:   Peripheral IV   Procedures and  diagnostic studies:   DG CHEST PORT 1 VIEW  Result Date: 02/16/2023 CLINICAL DATA:  Acute respiratory failure with hypoxemia EXAM: PORTABLE CHEST 1 VIEW COMPARISON:  02/03/2023 FINDINGS: Unchanged tracheostomy. Stable cardiomediastinal silhouette. Aortic atherosclerotic calcification. Increased left basilar atelectasis or infiltrates. Similar right basilar atelectasis. Chronic interstitial coarsening. Low lung volumes. No definite pleural effusion. No pneumothorax. No displaced rib fractures. IMPRESSION: Low lung volumes with increased left basilar atelectasis or infiltrates. Similar right basilar atelectasis. Electronically Signed   By: Placido Sou M.D.   On: 02/16/2023 21:35     Medical Consultants:   None.   Subjective:    LATINYA FORSETH nonverbal.  Objective:    Vitals:   02/16/23 1912 02/16/23 2002 02/16/23 2306 02/17/23 0430  BP: (!) 105/57  (!) 104/43   Pulse: (!) 106  88 91  Resp: (!) 33  (!) 31 (!) 25  Temp: 99.5 F (37.5 C) 100.2 F (37.9 C) 98.6 F (37 C)   TempSrc: Oral Axillary Oral   SpO2: 95%  100% 100%  Weight:      Height:       SpO2: 100 % O2 Flow Rate (L/min): 12 L/min FiO2 (%): 30 %   Intake/Output Summary (Last 24 hours) at 02/17/2023 0706 Last data filed at 02/17/2023 0539 Gross per 24 hour  Intake 1448.33 ml  Output 975 ml  Net 473.33 ml    Filed Weights   02/09/23 0804 02/11/23 0500 02/12/23 0500  Weight: 43 kg 43 kg 43 kg    Exam: General exam: In no acute distress, only responsive to pain Respiratory system: Good air movement and clear to auscultation. Cardiovascular system: S1 & S2 heard, RRR. No JVD,. Gastrointestinal system: Abdomen is nondistended, soft and nontender.  Extremities: No pedal edema. Skin: No rashes, lesions or ulcers Data Reviewed:    Labs: Basic Metabolic Panel: Recent Labs  Lab 02/11/23 0944 02/13/23 0449  NA 141 141  K 3.7 4.3  CL 109 106  CO2 21* 25  GLUCOSE 124* 104*  BUN 28* 26*  CREATININE  0.41* 0.48  CALCIUM 8.2* 8.7*  MG 2.0 2.2    GFR Estimated Creatinine Clearance: 38.7 mL/min (by C-G formula based on SCr of 0.48 mg/dL). Liver Function Tests: Recent Labs  Lab 02/13/23 0449  AST 36  ALT 23  ALKPHOS 178*  BILITOT 0.4  PROT 5.9*  ALBUMIN 2.2*    No results for input(s): "LIPASE", "AMYLASE" in the last 168 hours. No results for input(s): "AMMONIA" in the last 168 hours. Coagulation profile No results for input(s): "INR", "PROTIME" in the last 168 hours. COVID-19 Labs  No results for input(s): "DDIMER", "FERRITIN", "LDH", "CRP" in the last 72 hours.  No results found for: "SARSCOV2NAA"  CBC: Recent Labs  Lab 02/11/23 0944 02/13/23 0619 02/15/23 0813  WBC 10.8* 11.5* 8.8  NEUTROABS  --   --  4.9  HGB 10.5* 10.6* 11.4*  HCT 33.4* 35.1* 36.8  MCV 96.3 96.7 97.1  PLT 250 280 269    Cardiac Enzymes: No results for input(s): "  CKTOTAL", "CKMB", "CKMBINDEX", "TROPONINI" in the last 168 hours. BNP (last 3 results) No results for input(s): "PROBNP" in the last 8760 hours. CBG: No results for input(s): "GLUCAP" in the last 168 hours. D-Dimer: No results for input(s): "DDIMER" in the last 72 hours. Hgb A1c: No results for input(s): "HGBA1C" in the last 72 hours. Lipid Profile: No results for input(s): "CHOL", "HDL", "LDLCALC", "TRIG", "CHOLHDL", "LDLDIRECT" in the last 72 hours. Thyroid function studies: No results for input(s): "TSH", "T4TOTAL", "T3FREE", "THYROIDAB" in the last 72 hours.  Invalid input(s): "FREET3" Anemia work up: No results for input(s): "VITAMINB12", "FOLATE", "FERRITIN", "TIBC", "IRON", "RETICCTPCT" in the last 72 hours. Sepsis Labs: Recent Labs  Lab 02/11/23 0944 02/13/23 0619 02/15/23 0813  WBC 10.8* 11.5* 8.8    Microbiology No results found for this or any previous visit (from the past 240 hour(s)).   Medications:    Chlorhexidine Gluconate Cloth  6 each Topical Daily   famotidine  20 mg Per Tube QHS   feeding  supplement (PROSource TF20)  60 mL Per Tube Daily   fiber supplement (BANATROL TF)  60 mL Per Tube BID   Gerhardt's butt cream   Topical QID   latanoprost  1 drop Both Eyes QHS   levETIRAcetam  500 mg Per Tube BID   metoprolol tartrate  25 mg Per Tube BID   nutrition supplement (JUVEN)  1 packet Per Tube BID BM   mouth rinse  15 mL Mouth Rinse Q2H   phenylephrine-shark liver oil-mineral oil-petrolatum  1 Application Rectal BID   rosuvastatin  20 mg Per Tube Daily   Continuous Infusions:  sodium chloride Stopped (01/01/23 0424)   feeding supplement (JEVITY 1.2 CAL) 55 mL/hr at 02/16/23 2020      LOS: 73 days   Charlynne Cousins  [facility]  02/17/2023, 7:06 AM

## 2023-02-18 DIAGNOSIS — I619 Nontraumatic intracerebral hemorrhage, unspecified: Secondary | ICD-10-CM | POA: Diagnosis not present

## 2023-02-18 DIAGNOSIS — I612 Nontraumatic intracerebral hemorrhage in hemisphere, unspecified: Secondary | ICD-10-CM | POA: Diagnosis not present

## 2023-02-18 DIAGNOSIS — R0902 Hypoxemia: Secondary | ICD-10-CM | POA: Diagnosis not present

## 2023-02-18 MED ORDER — MEDIHONEY WOUND/BURN DRESSING EX PSTE
1.0000 | PASTE | Freq: Every day | CUTANEOUS | Status: DC
  Administered 2023-02-18 – 2023-02-25 (×8): 1 via TOPICAL
  Filled 2023-02-18: qty 44

## 2023-02-18 NOTE — Progress Notes (Signed)
[facility] PROGRESS NOTE    Progress Note  Pamela Johnston  [id number] DOB: 1941/08/29 DOA: 11/29/2022 PCP: [name], MD     Brief Narrative:   Pamela Johnston is an 82 y.o. 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  Neurology and neurosurgery was consulted she was intubated and admitted to the neuro ICU started on 3% saline palliative care was consulted due to her poor prognosis.  Underwent tracheostomy as well as PEG tube placement she was also started on Keppra for concerns of seizures.  She is currently on trach collar, palliative care has had multiple discussions with the family at 1 point hospital leadership was involved she remains a full code.  Clearly if she could be placed is a month out from her tracheostomy on 01/21/2023, now in the difficult to place list.    Significant Events: 12/21 - Presented to [facility] ED via EMS as Code Stroke. LKW 2000. Abrupt onset of decreased responsiveness, aphasia and R hemiplegia. Stroke admitting, NSGY consulted. PCCM consult for vent management. Treated with unasyn for aspiration pneumonia. CT Head with large L hemispheric parietal/temporal lobe IPH with extension into basal ganglia and IVH. HTS 3% started.   12/24 - Palliative care consulted, family requested meeting to be delayed 12/25 - Family requested goals of care meeting to be delayed 12/26 - Stopped Unasyn, replete phos; [name] GOC conversation: family says full code 12/27 - Restarted Unasyn for fever, RLL infiltrate; resp culture: enterobacter, MDR. Palliative consulted   12/28 - CT Head unchanged 12/29 - Unasyn changed to bactrim 12/31 - Posturing, CT head with increased shift, increased bleeding 1/2 - Family fired CVA service after discussion RE poor prognosis  1/4 - Off sedation  1/5 - No acute issues overnight. Remains on vent with increased respirations off sedation. Fever curve slightly decreased  1/6 - Pall care meeting moved to 2pm today. [name] the son-in-law will be present. On ven 30%/ On TF. Neo not started last night. MAP > 65 after fluids bolus. Febrile +.  Last CT head 12/09/22.  1/8 - No significant neurologic/clinical change. Brief GOC discussion with husband, Pamela Johnston, at bedside in AM. Concerns from staff re: patient GOC/decisionmaking, want to ensure husband is included in decisions/information sharing. Attempted bedside meeting but family prematurely ended meeting due to escalated discussion. 1/9 - Febrile overnight to 101.33F, remains on bromocriptine. No significant neurologic exam changes. 1/10 Family meeting yesterday with [name], plan for trach/peg though husband says they are still discussing this morning. Per Dt. Hulen Skains " So moving forward, Pamela Johnston is the designated medical decision maker for Pamela Johnston " 1/11 no new issues, plan for PEG tomorrow 1/12 for trach/peg today 1/15 No change in Neuro status , trach and PEG, No weaning , will repeat CT head without, get Case management involved in looking for LTAC ( Day 3 post trach on 1/12)   1/15-CT with some stabilization 1/19 No acute events overnight, respiratory culture 1/15 with abundant Pseudomonas, abundant Staphylococcus aureus, and moderate Enterobacter remains on Meropenem  1/22 trach collar trial 12 + hrs 1/24 trach collar x 24 hours on 35% 1/25-  concern for seizures-- keppra started after EEG done 2/12- concern for seizures-- EEG reordered, negative 2/19 -PCCM saw for trach maintenance 2/25: ? Aspiration event with PNA 2/28-worsening fever curves, requiring cooling  blanket, white count started to increase, ID consulted due to multiresistant Pseudomonas.  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  Multiple family meetings have happened over time with palliative care and prior attendings including the [place]. Medical decision making is Mr.Pamela Johnston patient's son-in-law. Remains a full code, stroke team has signed off. She is at high risk of aspiration. She has no chance of recovery. TOC has been consulted for LTAC placement.  Awaiting insurance authorization.  Fevers: On 12/07/2023 she developed worsening fevers and leukocytosis. Prior respiratory cultures showed Pseudomonas carbapenem resistant. Recent urine culture also showed highly resistant Pseudomonas and pansensitive Enterobacter. ID was consulted recommended 7 days course of Zerbaxa completed on 02/13/2023. Tmax of 98.8, leukocytosis has improved.  Concerns for seizures: On Keppra underwent EEG on 11/21/2023 due to left-sided rhythmic movements. EEG showed no seizures neurology has now signed off. No events.  Acute respiratory failure with hypoxia due to large intracerebral hemorrhage: Status post trach and PEG on 12/21/2022. PCCM following intermittently. Currently satting greater 97% on trach collar FiO2 of 28 with a flow rate of 6L. Tmax 99.9. Leukocytosis unremarkable.  Sinus tachycardia: Fluctuates, has episodes of sinus tach and short episodes of sinus tachycardia. Has remained afebrile.  No leukocytosis. Will continue to monitor closely.  X-rays unremarkable has no increased secretions. With unchanged oxygen requirements respiration rate stable.  Urinary retention: Foley placed on 01/01/2023  will need urologic evaluation as an outpatient.  History of chronic sacroiliitis/osteopenia/RLS: Continue supportive care.  Stage I significant results are/right ear ulcer RN Pressure Injury Documentation: Pressure Injury 12/22/22 Ear Right Stage 1 -  Intact skin with non-blanchable redness of a localized area usually over a bony prominence. (Active)  12/22/22 1816  Location: Ear  Location Orientation: Right  Staging: Stage 1 -  Intact skin with non-blanchable redness of a localized area usually over a bony prominence.  Wound Description (Comments):   Present on Admission: No  Dressing Type None 02/17/23 0730     Pressure Injury 02/16/23 Coccyx Medial Unstageable - Full thickness tissue loss in which the base of the injury is covered by slough (yellow, tan, gray, green or brown) and/or eschar (tan, brown or black) in the wound bed. 0.5 x 0.5cms 100% yellowslough (Active)  02/16/23 0800  Location: Coccyx  Location Orientation: Medial  Staging: Unstageable - Full thickness tissue loss in which the base of the injury is covered by slough (yellow, tan, gray, green or brown) and/or eschar (tan, brown or black) in the wound bed.  Wound Description (Comments): 0.5 x 0.5cms 100% yellowslough  Present on Admission: No  Dressing Type Foam - Lift dressing to assess site every shift 02/17/23 2000     DVT prophylaxis: scd Family Communication:sin in law Status is: Inpatient Remains inpatient appropriate because: Devastating large left hemispheric parietal infarct    Code Status:     Code Status Orders  (From admission, onward)           Start     Ordered   11/30/22 1451  Full code  Continuous       Question:  By:  Answer:  Consent: discussion documented in EHR   11/30/22 1450           Code Status History     Date Active Date Inactive Code Status Order ID Comments User Context   11/30/2022 0145 11/30/2022 1450 DNR [id number]  Maryjane Hurter, MD ED   11/29/2022 2218  11/30/2022 0144 Full Code [id number]  Greta Doom, MD ED      Advance Directive Documentation    Flowsheet Row Most Recent Value  Type of Advance Directive Healthcare Power of Attorney  Pre-existing out of facility DNR order (yellow form or pink MOST form) --  "MOST" Form in Place? --         IV Access:   Peripheral IV   Procedures and diagnostic studies:   DG CHEST PORT 1 VIEW  Result Date: 02/16/2023 CLINICAL DATA:  Acute respiratory failure with hypoxemia EXAM: PORTABLE CHEST 1 VIEW COMPARISON:  02/03/2023 FINDINGS: Unchanged tracheostomy. Stable cardiomediastinal silhouette. Aortic atherosclerotic calcification. Increased left basilar atelectasis or infiltrates. Similar right basilar atelectasis. Chronic interstitial coarsening. Low lung volumes. No definite pleural effusion. No pneumothorax. No displaced rib fractures. IMPRESSION: Low lung volumes with increased left basilar atelectasis or infiltrates. Similar right basilar atelectasis. Electronically Signed   By: Placido Sou M.D.   On: 02/16/2023 21:35     Medical Consultants:   None.   Subjective:    Pamela Johnston nonverbal.  Objective:    Vitals:   02/18/23 0005 02/18/23 0150 02/18/23 0314 02/18/23 0426  BP: 114/60  114/65   Pulse: 87  88 100  Resp: (!) 26  (!) 23 (!) 31  Temp: 98.6 F (37 C) 99.8 F (37.7 C) 98.9 F (37.2 C)   TempSrc: Axillary Axillary Rectal   SpO2: 99%  98% 99%  Weight:      Height:       SpO2: 99 % O2 Flow Rate (L/min): 6 L/min FiO2 (%): 28 %   Intake/Output Summary (Last 24 hours) at 02/18/2023 0751 Last data filed at 02/18/2023 0653 Gross per 24 hour  Intake 1441.58 ml  Output 1718 ml  Net -276.42 ml    Filed Weights   02/11/23 0500 02/12/23 0500 02/17/23 0706  Weight: 43 kg 43 kg 65.3 kg    Exam: General exam: In no acute distress, only responsive to pain Respiratory system: Good air movement and clear to auscultation. Cardiovascular system: S1 &  S2 heard, RRR. No JVD,. Gastrointestinal system: Abdomen is nondistended, soft and nontender.  Extremities: No pedal edema. Skin: No rashes, lesions or ulcers Data Reviewed:    Labs: Basic Metabolic Panel: Recent Labs  Lab 02/11/23 0944 02/13/23 0449  NA 141 141  K 3.7 4.3  CL 109 106  CO2 21* 25  GLUCOSE 124* 104*  BUN 28* 26*  CREATININE 0.41* 0.48  CALCIUM 8.2* 8.7*  MG 2.0 2.2    GFR Estimated Creatinine Clearance: 49.3 mL/min (by C-G formula based on SCr of 0.48 mg/dL). Liver Function Tests: Recent Labs  Lab 02/13/23 0449  AST 36  ALT 23  ALKPHOS 178*  BILITOT 0.4  PROT 5.9*  ALBUMIN 2.2*    No results for input(s): "LIPASE", "AMYLASE" in the last 168 hours. No results for input(s): "AMMONIA" in the last 168 hours. Coagulation profile No results for input(s): "INR", "PROTIME" in the last 168 hours. COVID-19 Labs  No results for input(s): "DDIMER", "FERRITIN", "LDH", "CRP" in the last 72 hours.  No results found for: "SARSCOV2NAA"  CBC: Recent  Labs  Lab 02/11/23 0944 02/13/23 0619 02/15/23 0813 02/17/23 0734  WBC 10.8* 11.5* 8.8 8.1  NEUTROABS  --   --  4.9 4.9  HGB 10.5* 10.6* 11.4* 11.9*  HCT 33.4* 35.1* 36.8 38.7  MCV 96.3 96.7 97.1 95.3  PLT 250 280 269 306    Cardiac Enzymes: No results for input(s): "CKTOTAL", "CKMB", "CKMBINDEX", "TROPONINI" in the last 168 hours. BNP (last 3 results) No results for input(s): "PROBNP" in the last 8760 hours. CBG: No results for input(s): "GLUCAP" in the last 168 hours. D-Dimer: No results for input(s): "DDIMER" in the last 72 hours. Hgb A1c: No results for input(s): "HGBA1C" in the last 72 hours. Lipid Profile: No results for input(s): "CHOL", "HDL", "LDLCALC", "TRIG", "CHOLHDL", "LDLDIRECT" in the last 72 hours. Thyroid function studies: No results for input(s): "TSH", "T4TOTAL", "T3FREE", "THYROIDAB" in the last 72 hours.  Invalid input(s): "FREET3" Anemia work up: No results for input(s):  "VITAMINB12", "FOLATE", "FERRITIN", "TIBC", "IRON", "RETICCTPCT" in the last 72 hours. Sepsis Labs: Recent Labs  Lab 02/11/23 0944 02/13/23 0619 02/15/23 0813 02/17/23 0734  WBC 10.8* 11.5* 8.8 8.1    Microbiology No results found for this or any previous visit (from the past 240 hour(s)).   Medications:    Chlorhexidine Gluconate Cloth  6 each Topical Daily   famotidine  20 mg Per Tube QHS   feeding supplement (PROSource TF20)  60 mL Per Tube Daily   fiber supplement (BANATROL TF)  60 mL Per Tube BID   Gerhardt's butt cream   Topical QID   latanoprost  1 drop Both Eyes QHS   levETIRAcetam  500 mg Per Tube BID   metoprolol tartrate  25 mg Per Tube BID   nutrition supplement (JUVEN)  1 packet Per Tube BID BM   mouth rinse  15 mL Mouth Rinse Q2H   phenylephrine-shark liver oil-mineral oil-petrolatum  1 Application Rectal BID   rosuvastatin  20 mg Per Tube Daily   Continuous Infusions:  sodium chloride Stopped (01/01/23 0424)   feeding supplement (JEVITY 1.2 CAL) 55 mL/hr at 02/18/23 0653      LOS: 67 days   Charlynne Cousins  [facility]  02/18/2023, 7:51 AM

## 2023-02-18 NOTE — Progress Notes (Signed)
TOC Supervisor spoke with patient daughter over the phone to answer some questions and address potential concerns.  At this time, patient family appreciative of involvement and willingness to support disposition planning.  TOC Supervisor remains available for support and discharge needs.  Barbette Or, [place]

## 2023-02-18 NOTE — Consult Note (Signed)
WOC Nurse Consult Note: Reason for Consult: ? Stage 1 coccyx  Wound type: Unstageable Pressure Injury  Pressure Injury POA: No  Measurement: 0.5 cms x 0.5 cms  Wound bed: 100% yellow devitalized tissue  Drainage (amount, consistency, odor) minimal serosanguinous  Periwound: intact. Noted to have fecal management system in place with leakage of stool surrounding  Dressing procedure/placement/frequency: Clean coccyx Unstageable Pressure Injury with NS, apply Medihoney to wound base utilizing Q tip applicator daily, cover with dry gauze and foam dressing.  May lift foam dressing to apply Medihoney daily.  Change foam dressing q3 days and prn soiling.    Patient currently on low air loss mattress for pressure redistribution and moisture management.  Prevalon boots in place to offload heels.    WOC will follow this patient weekly for assessment and management.    Thank you,    Norene Oliveri MSN, RN-BC, [employer]

## 2023-02-18 NOTE — Progress Notes (Addendum)
11am: CSW received message from [place] at [facility] who states a peer to peer is being offered for patient. The peer to peer is due [phone] extension [phone] by 3pm today.  CSW notified Dr. Aileen Fass of request.  8am: Patient's insurance authorization is still pending at this time  Madilyn Fireman, MSW, LCSW Transitions of Care  Clinical Social Worker II [phone]

## 2023-02-19 DIAGNOSIS — I612 Nontraumatic intracerebral hemorrhage in hemisphere, unspecified: Secondary | ICD-10-CM | POA: Diagnosis not present

## 2023-02-19 DIAGNOSIS — R0902 Hypoxemia: Secondary | ICD-10-CM | POA: Diagnosis not present

## 2023-02-19 DIAGNOSIS — I619 Nontraumatic intracerebral hemorrhage, unspecified: Secondary | ICD-10-CM | POA: Diagnosis not present

## 2023-02-19 MED ORDER — PSYLLIUM 95 % PO PACK
1.0000 | PACK | Freq: Every day | ORAL | Status: DC
  Filled 2023-02-19: qty 1

## 2023-02-19 NOTE — Progress Notes (Incomplete)
Physical Therapy Treatment Patient Details Name: Pamela Johnston MRN: [id number] DOB: August 04, 1941 Today's Date: 02/19/2023   History of Present Illness 81-yer-old woman who presented to [facility] ED 12/21 as a Code Stroke with +aphasia and R-sided weakness. 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 Pt underwent trach and peg on 12/19/22. PMHx significant for HTN, chronic sacroiliitis (managed with Percocet), RLS, osteopenia.    PT Comments    Patient continues with contractures R elbow extension and pronation, L elbow flexion and L knee flexion and hip internal rotation and adduction.  Able to extend L elbow almost to neutral and L knee extension about to 40 degrees.  She was soiled as flexiseal out so assisted NT for hygiene and RN to replace tube.  Patient grimacing with ROM of LE's today.  She will need follow up PT at [facility] for restorative nursing program set up.  PT to follow weekly with communication to [facility].   Recommendations for follow up therapy are one component of a multi-disciplinary discharge planning process, led by the attending physician.  Recommendations may be updated based on patient status, additional functional criteria and insurance authorization.  Follow Up Recommendations  Long-term institutional care without follow-up therapy Can patient physically be transported by private vehicle: No   Assistance Recommended at Discharge Frequent or constant Supervision/Assistance  Patient can return home with the following Two people to help with walking and/or transfers;Two people to help with bathing/dressing/bathroom;Assistance with feeding;Assist for transportation;Direct supervision/assist for medications management   Equipment Recommendations  None recommended by PT    Recommendations for Other Services       Precautions / Restrictions Precautions Precautions:  Fall Precaution Comments: trach, PEG, flexiseal     Mobility  Bed Mobility Overal bed mobility: Needs Assistance Bed Mobility: Rolling Rolling: Total assist, +2 for physical assistance         General bed mobility comments: rolled for hygiene as flexiseal came out; RN and tech in to assist    Transfers                        Ambulation/Gait                   Stairs             Wheelchair Mobility    Modified Rankin (Stroke Patients Only) Modified Rankin (Stroke Patients Only) Pre-Morbid Rankin Score: Moderately severe disability Modified Rankin: Severe disability     Balance                                            Cognition Arousal/Alertness: Lethargic Behavior During Therapy: Flat affect Overall Cognitive Status: Impaired/Different from baseline                                 General Comments: not following commands, opens eyes some with sitting EOB but not localizing to stimuli        Exercises Other Exercises Other Exercises: PROM x4 extremities in supine noted limitations in L knee extension, L hip abduction/ER L elbow extension and pronation and R shoulder flexion/ER/ABD and elbow flexion    General Comments        Pertinent  Vitals/Pain Pain Assessment Pain Assessment: Faces Faces Pain Scale: Hurts a little bit Pain Location: grimacing with LE ROM Pain Descriptors / Indicators: Grimacing Pain Intervention(s): Monitored during session, Repositioned, Limited activity within patient's tolerance    Home Living                          Prior Function            PT Goals (current goals can now be found in the care plan section) Progress towards PT goals: Not progressing toward goals - comment    Frequency    Min 1X/week      PT Plan Current plan remains appropriate    Co-evaluation              AM-PAC PT "6 Clicks" Mobility   Outcome Measure  Help needed  turning from your back to your side while in a flat bed without using bedrails?: Total Help needed moving from lying on your back to sitting on the side of a flat bed without using bedrails?: Total Help needed moving to and from a bed to a chair (including a wheelchair)?: Total Help needed standing up from a chair using your arms (e.g., wheelchair or bedside chair)?: Total Help needed to walk in hospital room?: Total Help needed climbing 3-5 steps with a railing? : Total 6 Click Score: 6    End of Session   Activity Tolerance: Patient tolerated treatment well Patient left: in bed         Time: [id number] PT Time Calculation (min) (ACUTE ONLY): 34 min  Charges:  $Therapeutic Exercise: 8-22 mins $Therapeutic Activity: 8-22 mins                     Magda Kiel, PT Acute Rehabilitation Services Office:[phone] 02/20/2023    Pamela Johnston 02/19/2023, 5:24 PM

## 2023-02-19 NOTE — Progress Notes (Signed)
[facility] PROGRESS NOTE    Progress Note  Pamela Johnston  [id number] DOB: 1941/08/29 DOA: 11/29/2022 PCP: [name], MD     Brief Narrative:   Pamela Johnston is an 82 y.o. 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  Neurology and neurosurgery was consulted she was intubated and admitted to the neuro ICU started on 3% saline palliative care was consulted due to her poor prognosis.  Underwent tracheostomy as well as PEG tube placement she was also started on Keppra for concerns of seizures.  She is currently on trach collar, palliative care has had multiple discussions with the family at 1 point hospital leadership was involved she remains a full code.  Clearly if she could be placed is a month out from her tracheostomy on 01/21/2023, now in the difficult to place list.    Significant Events: 12/21 - Presented to [facility] ED via EMS as Code Stroke. LKW 2000. Abrupt onset of decreased responsiveness, aphasia and R hemiplegia. Stroke admitting, NSGY consulted. PCCM consult for vent management. Treated with unasyn for aspiration pneumonia. CT Head with large L hemispheric parietal/temporal lobe IPH with extension into basal ganglia and IVH. HTS 3% started.   12/24 - Palliative care consulted, family requested meeting to be delayed 12/25 - Family requested goals of care meeting to be delayed 12/26 - Stopped Unasyn, replete phos; [name] GOC conversation: family says full code 12/27 - Restarted Unasyn for fever, RLL infiltrate; resp culture: enterobacter, MDR. Palliative consulted   12/28 - CT Head unchanged 12/29 - Unasyn changed to bactrim 12/31 - Posturing, CT head with increased shift, increased bleeding 1/2 - Family fired CVA service after discussion RE poor prognosis  1/4 - Off sedation  1/5 - No acute issues overnight. Remains on vent with increased respirations off sedation. Fever curve slightly decreased  1/6 - Pall care meeting moved to 2pm today. [name] the son-in-law will be present. On ven 30%/ On TF. Neo not started last night. MAP > 65 after fluids bolus. Febrile +.  Last CT head 12/09/22.  1/8 - No significant neurologic/clinical change. Brief GOC discussion with husband, [name], at bedside in AM. Concerns from staff re: patient GOC/decisionmaking, want to ensure husband is included in decisions/information sharing. Attempted bedside meeting but family prematurely ended meeting due to escalated discussion. 1/9 - Febrile overnight to 101.33F, remains on bromocriptine. No significant neurologic exam changes. 1/10 Family meeting yesterday with [name], plan for trach/peg though husband says they are still discussing this morning. Per Dt. Hulen Skains " So moving forward, Pamela Johnston is the designated medical decision maker for [name] " 1/11 no new issues, plan for PEG tomorrow 1/12 for trach/peg today 1/15 No change in Neuro status , trach and PEG, No weaning , will repeat CT head without, get Case management involved in looking for LTAC ( Day 3 post trach on 1/12)   1/15-CT with some stabilization 1/19 No acute events overnight, respiratory culture 1/15 with abundant Pseudomonas, abundant Staphylococcus aureus, and moderate Enterobacter remains on Meropenem  1/22 trach collar trial 12 + hrs 1/24 trach collar x 24 hours on 35% 1/25-  concern for seizures-- keppra started after EEG done 2/12- concern for seizures-- EEG reordered, negative 2/19 -PCCM saw for trach maintenance 2/25: ? Aspiration event with PNA 2/28-worsening fever curves, requiring cooling  blanket, white count started to increase, ID consulted due to multiresistant Pseudomonas.  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  Multiple family meetings have happened over time with palliative care and prior attendings including the [place]. Medical decision making is [name] patient's son-in-law. Remains a full code, stroke team has signed off. She is at high risk of aspiration. She has no chance of recovery. TOC has been consulted for LTAC placement.  Awaiting insurance authorization.  Fevers of unknown source: On 12/07/2023 she developed worsening fevers and leukocytosis. Prior respiratory cultures showed Pseudomonas carbapenem resistant. Recent urine culture also showed highly resistant Pseudomonas and pansensitive Enterobacter. ID was consulted recommended 7 days course of Zerbaxa completed on 02/13/2023. Tmax of 98.8, leukocytosis has improved.  Concerns for seizures: On Keppra underwent EEG on 11/21/2023 due to left-sided rhythmic movements. EEG showed no seizures neurology has now signed off. No events.  Acute respiratory failure with hypoxia due to large intracerebral hemorrhage: Status post trach and PEG on 12/21/2022. PCCM following intermittently. Currently satting greater 97% on trach collar FiO2 of 28 with a flow rate of 6L. Tmax 99.9. Leukocytosis unremarkable.  Sinus tachycardia: Fluctuates, has episodes of sinus tach and short episodes of sinus tachycardia. Has remained afebrile.  No leukocytosis. Will continue to monitor closely.  X-rays unremarkable has no increased secretions. With unchanged oxygen requirements respiration rate stable.  Urinary retention: Foley  placed on 01/01/2023 will need urologic evaluation as an outpatient.  History of chronic sacroiliitis/osteopenia/RLS: Continue supportive care.  Stage I significant results are/right ear ulcer RN Pressure Injury Documentation: Pressure Injury 12/22/22 Ear Right Stage 1 -  Intact skin with non-blanchable redness of a localized area usually over a bony prominence. (Active)  12/22/22 1816  Location: Ear  Location Orientation: Right  Staging: Stage 1 -  Intact skin with non-blanchable redness of a localized area usually over a bony prominence.  Wound Description (Comments):   Present on Admission: No  Dressing Type None 02/18/23 2000     Pressure Injury 02/16/23 Coccyx Medial Unstageable - Full thickness tissue loss in which the base of the injury is covered by slough (yellow, tan, gray, green or brown) and/or eschar (tan, brown or black) in the wound bed. 0.5 x 0.5cms 100% yellowslough (Active)  02/16/23 0800  Location: Coccyx  Location Orientation: Medial  Staging: Unstageable - Full thickness tissue loss in which the base of the injury is covered by slough (yellow, tan, gray, green or brown) and/or eschar (tan, brown or black) in the wound bed.  Wound Description (Comments): 0.5 x 0.5cms 100% yellowslough  Present on Admission: No  Dressing Type Honey 02/18/23 2000     DVT prophylaxis: scd Family Communication:sin in law Status is: Inpatient Remains inpatient appropriate because: Devastating large left hemispheric parietal infarct    Code Status:     Code Status Orders  (From admission, onward)           Start     Ordered   11/30/22 1451  Full code  Continuous       Question:  By:  Answer:  Consent: discussion documented  in EHR   11/30/22 1450           Code Status History     Date Active Date Inactive Code Status Order ID Comments User Context   11/30/2022 0145 11/30/2022 1450 DNR [id number]  Maryjane Hurter, MD ED   11/29/2022 2218 11/30/2022 0144 Full Code  [id number]  Greta Doom, MD ED      Advance Directive Documentation    Flowsheet Row Most Recent Value  Type of Advance Directive Healthcare Power of Attorney  Pre-existing out of facility DNR order (yellow form or pink MOST form) --  "MOST" Form in Place? --         IV Access:   Peripheral IV   Procedures and diagnostic studies:   No results found.   Medical Consultants:   None.   Subjective:    AMELIAH UPTON nonverbal.  Objective:    Vitals:   02/18/23 2105 02/18/23 2335 02/19/23 0400 02/19/23 0414  BP:    (!) 120/59  Pulse: (!) 112  (!) 104 (!) 103  Resp: (!) 28  (!) 29 (!) 26  Temp:    99.2 F (37.3 C)  TempSrc:    Axillary  SpO2: 96% 98% 96% 97%  Weight:      Height:       SpO2: 97 % O2 Flow Rate (L/min): 6 L/min FiO2 (%): 28 %   Intake/Output Summary (Last 24 hours) at 02/19/2023 0734 Last data filed at 02/19/2023 0440 Gross per 24 hour  Intake 30 ml  Output 1600 ml  Net -1570 ml    Filed Weights   02/11/23 0500 02/12/23 0500 02/17/23 0706  Weight: 43 kg 43 kg 65.3 kg    Exam: General exam: In no acute distress, only responsive to pain Respiratory system: Good air movement and clear to auscultation. Cardiovascular system: S1 & S2 heard, RRR. No JVD,. Gastrointestinal system: Abdomen is nondistended, soft and nontender.  Extremities: No pedal edema. Skin: No rashes, lesions or ulcers Data Reviewed:    Labs: Basic Metabolic Panel: Recent Labs  Lab 02/13/23 0449  NA 141  K 4.3  CL 106  CO2 25  GLUCOSE 104*  BUN 26*  CREATININE 0.48  CALCIUM 8.7*  MG 2.2    GFR Estimated Creatinine Clearance: 49.3 mL/min (by C-G formula based on SCr of 0.48 mg/dL). Liver Function Tests: Recent Labs  Lab 02/13/23 0449  AST 36  ALT 23  ALKPHOS 178*  BILITOT 0.4  PROT 5.9*  ALBUMIN 2.2*    No results for input(s): "LIPASE", "AMYLASE" in the last 168 hours. No results for input(s): "AMMONIA" in the last 168  hours. Coagulation profile No results for input(s): "INR", "PROTIME" in the last 168 hours. COVID-19 Labs  No results for input(s): "DDIMER", "FERRITIN", "LDH", "CRP" in the last 72 hours.  No results found for: "SARSCOV2NAA"  CBC: Recent Labs  Lab 02/13/23 0619 02/15/23 0813 02/17/23 0734  WBC 11.5* 8.8 8.1  NEUTROABS  --  4.9 4.9  HGB 10.6* 11.4* 11.9*  HCT 35.1* 36.8 38.7  MCV 96.7 97.1 95.3  PLT 280 269 306    Cardiac Enzymes: No results for input(s): "CKTOTAL", "CKMB", "CKMBINDEX", "TROPONINI" in the last 168 hours. BNP (last 3 results) No results for input(s): "PROBNP" in the last 8760 hours. CBG: No results for input(s): "GLUCAP" in the last 168 hours. D-Dimer: No results for input(s): "DDIMER" in the last 72 hours. Hgb A1c: No results for input(s): "HGBA1C" in the last 72  hours. Lipid Profile: No results for input(s): "CHOL", "HDL", "LDLCALC", "TRIG", "CHOLHDL", "LDLDIRECT" in the last 72 hours. Thyroid function studies: No results for input(s): "TSH", "T4TOTAL", "T3FREE", "THYROIDAB" in the last 72 hours.  Invalid input(s): "FREET3" Anemia work up: No results for input(s): "VITAMINB12", "FOLATE", "FERRITIN", "TIBC", "IRON", "RETICCTPCT" in the last 72 hours. Sepsis Labs: Recent Labs  Lab 02/13/23 0619 02/15/23 0813 02/17/23 0734  WBC 11.5* 8.8 8.1    Microbiology No results found for this or any previous visit (from the past 240 hour(s)).   Medications:    Chlorhexidine Gluconate Cloth  6 each Topical Daily   famotidine  20 mg Per Tube QHS   feeding supplement (PROSource TF20)  60 mL Per Tube Daily   fiber supplement (BANATROL TF)  60 mL Per Tube BID   Gerhardt's butt cream   Topical QID   latanoprost  1 drop Both Eyes QHS   leptospermum manuka honey  1 Application Topical Daily   levETIRAcetam  500 mg Per Tube BID   metoprolol tartrate  25 mg Per Tube BID   nutrition supplement (JUVEN)  1 packet Per Tube BID BM   mouth rinse  15 mL Mouth  Rinse Q2H   phenylephrine-shark liver oil-mineral oil-petrolatum  1 Application Rectal BID   rosuvastatin  20 mg Per Tube Daily   Continuous Infusions:  sodium chloride Stopped (01/01/23 0424)   feeding supplement (JEVITY 1.2 CAL) 1,000 mL (02/19/23 0513)      LOS: 82 days   Charlynne Cousins  [facility]  02/19/2023, 7:34 AM

## 2023-02-19 NOTE — Progress Notes (Addendum)
Patient's insurance authorization was denied as peer to peer was not completed.  CSW attempted to submit appeal for denial and was unsuccessful due to inability to reach appropriate [facility] staff.  CSW sent secure email to [facility] supervisor requesting assistance.  Madilyn Fireman, MSW, LCSW Transitions of Care  Clinical Social Worker II [phone]

## 2023-02-20 DIAGNOSIS — I612 Nontraumatic intracerebral hemorrhage in hemisphere, unspecified: Secondary | ICD-10-CM | POA: Diagnosis not present

## 2023-02-20 NOTE — Progress Notes (Signed)
PROGRESS NOTE    Pamela Johnston  [id number] DOB: 08-Jul-1941 DOA: 11/29/2022 PCP: [name], MD   Brief Narrative: Pamela Johnston is an 82 y.o. 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  Neurology and neurosurgery was consulted she was intubated and admitted to the neuro ICU started on 3% saline palliative care was consulted due to her poor prognosis.  Underwent tracheostomy as well as PEG tube placement she was also started on Keppra for concerns of seizures.  She is currently on trach collar, palliative care has had multiple discussions with the family at 1 point hospital leadership was involved she remains a full code.  Clearly if she could be placed is a month out from her tracheostomy on 01/21/2023, now in the difficult to place list.       Significant Events: 12/21 - Presented to [facility] ED via EMS as Code Stroke. LKW 2000. Abrupt onset of decreased responsiveness, aphasia and R hemiplegia. Stroke admitting, NSGY consulted. PCCM consult for vent management. Treated with unasyn for aspiration pneumonia. CT Head with large L hemispheric parietal/temporal lobe IPH with extension into basal ganglia and IVH. HTS 3% started.   12/24 - Palliative care consulted, family requested meeting to be delayed 12/25 - Family requested goals of care meeting to be delayed 12/26 - Stopped Unasyn, replete phos; [name] GOC conversation: family says full code 12/27 - Restarted Unasyn for fever, RLL infiltrate; resp culture: enterobacter, MDR. Palliative consulted  12/28 - CT Head unchanged 12/29 -  Unasyn changed to bactrim 12/31 - Posturing, CT head with increased shift, increased bleeding 1/2 - Family fired CVA service after discussion RE poor prognosis  1/4 - Off sedation  1/5 - No acute issues overnight. Remains on vent with increased respirations off sedation. Fever curve slightly decreased  1/6 - Pall care meeting moved to 2pm today. [name] the son-in-law will be present. On ven 30%/ On TF. Neo not started last night. MAP > 65 after fluids bolus. Febrile +.  Last CT head 12/09/22.  1/8 - No significant neurologic/clinical change. Brief GOC discussion with husband, [name], at bedside in AM. Concerns from staff re: patient GOC/decisionmaking, want to ensure husband is included in decisions/information sharing. Attempted bedside meeting but family prematurely ended meeting due to escalated discussion. 1/9 - Febrile overnight to 101.58F, remains on bromocriptine. No significant neurologic exam changes. 1/10 Family meeting yesterday with [name], plan for trach/peg though husband says they are still discussing this morning. Per Dt. Hulen Skains " So moving forward, [name] is the designated medical decision maker for Tashenna Wellbaum " 1/11 no new issues, plan for PEG tomorrow 1/12 for trach/peg today 1/15 No change in Neuro status , trach and PEG, No weaning , will repeat CT head without, get Case management involved in looking for LTAC ( Day 3 post trach on 1/12)   1/15-CT with some stabilization 1/19 No acute events overnight, respiratory culture 1/15 with abundant Pseudomonas, abundant Staphylococcus aureus, and moderate Enterobacter remains on Meropenem  1/22 trach collar trial 12 + hrs 1/24 trach collar x 24 hours on 35% 1/25- concern for seizures-- keppra started  after EEG done 2/12- concern for seizures-- EEG reordered, negative 2/19 -PCCM saw for trach maintenance 2/25: ? Aspiration event with PNA 2/28-worsening fever curves, requiring cooling blanket, white count started to increase,  ID consulted due to multiresistant Pseudomonas.  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 High risk for aspiration. High degree of certainty that patient will not regain prior function. Plan for hopeful LTAC placement.  Pseudomonas aeruginosa/Enterobacter aerogenes UTI Patient completed course of Zerbaxa. Resolved.  Possible seizures Neurology consulted. EEG without evidence of seizures. Patient started on Keppra -Continue Keppra  Acute respiratory failure with hypoxia Secondary to brain injury. Patient is s/p tracheostomy.  Sinus tachycardia Noted.  Urinary retention Foley catheter placed on 01/01/2023.  Pressure injury Medial coccyx and right eat. Not present on admission.   DVT prophylaxis: SCDs Code Status:   Code Status: Full Code Family Communication: None at bedside Disposition Plan: Discharge pending goals of care/placement   Consultants:    Procedures:    Antimicrobials:     Subjective: Patient is not alert/oriented. Does not respond to questions  Objective: BP (!) 106/51 (BP Location: Left Arm)   Pulse (!) 122   Temp 99.2 F (37.3 C) (Axillary)   Resp 16   Ht '5\' 1"'$  (1.549 m)   Wt 65.3 kg   SpO2 100%   BMI 27.21 kg/m   Examination:  General exam: Appears calm and comfortable. Respiratory system: Bilateral rhonchi. Respiratory effort normal. Tracheostomy noted. Cardiovascular system: S1 & S2 heard, RRR. Gastrointestinal system: Abdomen is nondistended, soft and nontender. No organomegaly or masses felt. Normal bowel sounds heard. Central nervous system: Does not appear to be alert.  PERRL. Does not localize to pain. Musculoskeletal: No edema. No calf tenderness   Data Reviewed: I have personally reviewed following labs and imaging  studies  CBC Lab Results  Component Value Date   WBC 8.1 02/17/2023   RBC 4.06 02/17/2023   HGB 11.9 (L) 02/17/2023   HCT 38.7 02/17/2023   MCV 95.3 02/17/2023   MCH 29.3 02/17/2023   PLT 306 02/17/2023   MCHC 30.7 02/17/2023   RDW 14.8 02/17/2023   LYMPHSABS 2.0 02/17/2023   MONOABS 0.6 02/17/2023   EOSABS 0.6 (H) 02/17/2023   BASOSABS 0.0 [id number]     Last metabolic panel Lab Results  Component Value Date   NA 141 02/13/2023   K 4.3 02/13/2023   CL 106 02/13/2023   CO2 25 02/13/2023   BUN 26 (H) 02/13/2023   CREATININE 0.48 02/13/2023   GLUCOSE 104 (H) 02/13/2023   GFRNONAA >60 02/13/2023   CALCIUM 8.7 (L) 02/13/2023   PHOS 3.0 12/27/2022   PROT 5.9 (L) 02/13/2023   ALBUMIN 2.2 (L) 02/13/2023   BILITOT 0.4 02/13/2023   ALKPHOS 178 (H) 02/13/2023   AST 36 02/13/2023   ALT 23 02/13/2023   ANIONGAP 10 02/13/2023    GFR: Estimated Creatinine Clearance: 49.3 mL/min (by C-G formula based on SCr of 0.48 mg/dL).  No results found for this or any previous visit (from the past 240 hour(s)).    Radiology Studies: No results found.    LOS: 7 days    Cordelia Poche, MD [facility] 02/20/2023, 10:11 AM   If 7PM-7AM, please contact night-coverage [email or web address]

## 2023-02-20 NOTE — Hospital Course (Addendum)
Pamela Johnston is an 82 y.o. 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  Neurology and neurosurgery was consulted she was intubated and admitted to the neuro ICU started on 3% saline palliative care was consulted due to her poor prognosis.  Underwent tracheostomy as well as PEG tube placement she was also started on Keppra for concerns of seizures.  She is currently on trach collar, palliative care has had multiple discussions with the family at 1 point hospital leadership was involved she remains a full code.  Clearly if she could be placed is a month out from her tracheostomy on 01/21/2023, now in the difficult to place list.       Significant Events: 12/21 - Presented to [facility] ED via EMS as Code Stroke. LKW 2000. Abrupt onset of decreased responsiveness, aphasia and R hemiplegia. Stroke admitting, NSGY consulted. PCCM consult for vent management. Treated with unasyn for aspiration pneumonia. CT Head with large L hemispheric parietal/temporal lobe IPH with extension into basal ganglia and IVH. HTS 3% started.   12/24 - Palliative care consulted, family requested meeting to be delayed 12/25 - Family requested goals of care meeting to be delayed 12/26 - Stopped Unasyn, replete phos; [name] GOC conversation: family says full code 12/27 - Restarted Unasyn for fever, RLL infiltrate; resp culture: enterobacter, MDR. Palliative consulted  12/28 - CT Head unchanged 12/29 - Unasyn changed to bactrim 12/31 - Posturing, CT head with increased shift, increased bleeding 1/2 - Family fired CVA service after  discussion RE poor prognosis  1/4 - Off sedation  1/5 - No acute issues overnight. Remains on vent with increased respirations off sedation. Fever curve slightly decreased  1/6 - Pall care meeting moved to 2pm today. [name] the son-in-law will be present. On ven 30%/ On TF. Neo not started last night. MAP > 65 after fluids bolus. Febrile +.  Last CT head 12/09/22.  1/8 - No significant neurologic/clinical change. Brief GOC discussion with husband, [name], at bedside in AM. Concerns from staff re: patient GOC/decisionmaking, want to ensure husband is included in decisions/information sharing. Attempted bedside meeting but family prematurely ended meeting due to escalated discussion. 1/9 - Febrile overnight to 101.63F, remains on bromocriptine. No significant neurologic exam changes. 1/10 Family meeting yesterday with [name], plan for trach/peg though husband says they are still discussing this morning. Per Dt. Hulen Skains " So moving forward, [name] is the designated medical decision maker for Rohnda Trimarchi " 1/11 no new issues, plan for PEG tomorrow 1/12 for trach/peg today 1/15 No change in Neuro status , trach and PEG, No weaning , will repeat CT head without, get Case management involved in looking for LTAC ( Day 3 post trach on 1/12)   1/15-CT with some stabilization 1/19 No acute events overnight, respiratory culture 1/15 with abundant Pseudomonas, abundant Staphylococcus aureus, and moderate Enterobacter remains on Meropenem  1/22 trach collar trial 12 + hrs 1/24 trach collar x 24 hours on 35% 1/25- concern for seizures-- keppra started after EEG done 2/12- concern for seizures-- EEG reordered, negative 2/19 -PCCM saw for trach maintenance 2/25: ? Aspiration event with PNA 2/28-worsening  fever curves, requiring cooling blanket, white count started to increase, ID consulted due to multiresistant Pseudomonas.  Started on 7-day of Zerbaxa - end date 3/6 3/13-13: No changes

## 2023-02-21 DIAGNOSIS — I612 Nontraumatic intracerebral hemorrhage in hemisphere, unspecified: Secondary | ICD-10-CM | POA: Diagnosis not present

## 2023-02-21 NOTE — Progress Notes (Signed)
Brief Nutrition Note  Pt last seen by RD for follow-up on 01/29/23. At that time, pt was tolerating tube feeds at goal rate via PEG without issue. Tube feeds continue to infuse at goal rate via PEG. Pt remains on Juven to promote wound healing and Banatrol TF for loose stools.  Reviewed weight history since last RD follow-up. Question accuracy of weights as pt's weight has fluctuated between 42-86 kg since 2/20 per documentation. Prior to that, weight has been stable between 58-63 kg.  Pt remains stable on current tube feeding regimen listed below. Do not anticipate need for acute changes to tube feeding regimen. Pt is stable for discharge and is on Difficult to Place list for discharge planning. No additional nutrition interventions warranted at this time. RD will remain signed-off and continue to check in monthly. Please re-consult as needed.  Continue tube feeding via PEG tube: - Jevity 1.2 @ 55 ml/hr (1320 ml/day) - PROSource TF20 60 ml daily   Tube feeding regimen provides 1664 kcal, 93 grams of protein, and 1065 ml of H2O.    - Continue 1 packet Juven BID per tube, each packet provides 95 calories, 2.5 grams of protein, and 9.8 grams of carbohydrate; also contains L-arginine and L-glutamine, vitamin C, vitamin E, vitamin B-12, zinc, calcium, and calcium Beta-hydroxy-Beta-methylbutyrate to support wound healing   - Continue Banatrol TF 60 ml BID per tube for loose stools   Gustavus Bryant, MS, RD, LDN Inpatient Clinical Dietitian Please see AMi[facility] for contact information.

## 2023-02-21 NOTE — Progress Notes (Signed)
PROGRESS NOTE    Pamela Johnston  [id number] DOB: [birth year] DOA: 11/29/2022 PCP: [name], MD   Brief Narrative: Pamela Johnston is an 82 y.o. 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  Neurology and neurosurgery was consulted she was intubated and admitted to the neuro ICU started on 3% saline palliative care was consulted due to her poor prognosis.  Underwent tracheostomy as well as PEG tube placement she was also started on Keppra for concerns of seizures.  She is currently on trach collar, palliative care has had multiple discussions with the family at 1 point hospital leadership was involved she remains a full code.  Clearly if she could be placed is a month out from her tracheostomy on 01/21/2023, now in the difficult to place list.       Significant Events: 12/21 - Presented to [facility] ED via EMS as Code Stroke. LKW 2000. Abrupt onset of decreased responsiveness, aphasia and R hemiplegia. Stroke admitting, NSGY consulted. PCCM consult for vent management. Treated with unasyn for aspiration pneumonia. CT Head with large L hemispheric parietal/temporal lobe IPH with extension into basal ganglia and IVH. HTS 3% started.   12/24 - Palliative care consulted, family requested meeting to be delayed 12/25 - Family requested goals of care meeting to be delayed 12/26 - Stopped Unasyn, replete phos; [name] GOC conversation: family says full code 12/27 - Restarted Unasyn for fever, RLL infiltrate; resp culture: enterobacter, MDR. Palliative consulted  12/28 - CT Head unchanged 12/29 -  Unasyn changed to bactrim 12/31 - Posturing, CT head with increased shift, increased bleeding 1/2 - Family fired CVA service after discussion RE poor prognosis  1/4 - Off sedation  1/5 - No acute issues overnight. Remains on vent with increased respirations off sedation. Fever curve slightly decreased  1/6 - Pall care meeting moved to 2pm today. [name] the son-in-law will be present. On ven 30%/ On TF. Neo not started last night. MAP > 65 after fluids bolus. Febrile +.  Last CT head 12/09/22.  1/8 - No significant neurologic/clinical change. Brief GOC discussion with husband, [name], at bedside in AM. Concerns from staff re: patient GOC/decisionmaking, want to ensure husband is included in decisions/information sharing. Attempted bedside meeting but family prematurely ended meeting due to escalated discussion. 1/9 - Febrile overnight to 101.9F, remains on bromocriptine. No significant neurologic exam changes. 1/10 Family meeting yesterday with [name], plan for trach/peg though husband says they are still discussing this morning. Per Dt. Hulen Skains " So moving forward, [name] is the designated medical decision maker for [name] " 1/11 no new issues, plan for PEG tomorrow 1/12 for trach/peg today 1/15 No change in Neuro status , trach and PEG, No weaning , will repeat CT head without, get Case management involved in looking for LTAC ( Day 3 post trach on 1/12)   1/15-CT with some stabilization 1/19 No acute events overnight, respiratory culture 1/15 with abundant Pseudomonas, abundant Staphylococcus aureus, and moderate Enterobacter remains on Meropenem  1/22 trach collar trial 12 + hrs 1/24 trach collar x 24 hours on 35% 1/25- concern for seizures-- keppra started  after EEG done 2/12- concern for seizures-- EEG reordered, negative 2/19 -PCCM saw for trach maintenance 2/25: ? Aspiration event with PNA 2/28-worsening fever curves, requiring cooling blanket, white count started to increase,  ID consulted due to multiresistant Pseudomonas.  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 High risk for aspiration. High degree of certainty that patient will not regain prior function. Plan for hopeful LTAC placement.  Pseudomonas aeruginosa/Enterobacter aerogenes UTI Patient completed course of Zerbaxa. Resolved.  Possible seizures Neurology consulted. EEG without evidence of seizures. Patient started on Keppra -Continue Keppra  Acute respiratory failure with hypoxia Secondary to brain injury. Patient is s/p tracheostomy.  Sinus tachycardia Noted.  Urinary retention Foley catheter placed on 01/01/2023.  Pressure injury Medial coccyx and right eat. Not present on admission.   DVT prophylaxis: SCDs Code Status:   Code Status: Full Code Family Communication: None at bedside Disposition Plan: Discharge pending goals of care/placement   Consultants:  PCCM Neurosurgery Palliative care medicine General surgery Infectious disease  Procedures:    Antimicrobials: Ceftolozane-Tazobactam Unasyn Cefepime Meropenem Bactrim   Subjective: Non-verbal  Objective: BP 120/72 (BP Location: Left Arm)   Pulse (!) 116   Temp 98.7 F (37.1 C) (Axillary)   Resp (!) 26   Ht '5\' 1"'$  (1.549 m)   Wt 86 kg   SpO2 97%   BMI 35.82 kg/m   Examination:  General exam: Appears calm and comfortable Respiratory system: Rhonchi. Respiratory effort normal. Cardiovascular system: S1 & S2 heard, RRR. Gastrointestinal system: Abdomen is nondistended, soft and nontender. Central nervous system: Patient is not alert. Musculoskeletal: No edema. No calf tenderness    Data Reviewed: I have personally reviewed following labs and imaging studies  CBC Lab Results  Component Value Date    WBC 8.1 02/17/2023   RBC 4.06 02/17/2023   HGB 11.9 (L) 02/17/2023   HCT 38.7 02/17/2023   MCV 95.3 02/17/2023   MCH 29.3 02/17/2023   PLT 306 02/17/2023   MCHC 30.7 02/17/2023   RDW 14.8 02/17/2023   LYMPHSABS 2.0 02/17/2023   MONOABS 0.6 02/17/2023   EOSABS 0.6 (H) 02/17/2023   BASOSABS 0.0 123456     Last metabolic panel Lab Results  Component Value Date   NA 141 02/13/2023   K 4.3 02/13/2023   CL 106 02/13/2023   CO2 25 02/13/2023   BUN 26 (H) 02/13/2023   CREATININE 0.48 02/13/2023   GLUCOSE 104 (H) 02/13/2023   GFRNONAA >60 02/13/2023   CALCIUM 8.7 (L) 02/13/2023   PHOS 3.0 12/27/2022   PROT 5.9 (L) 02/13/2023   ALBUMIN 2.2 (L) 02/13/2023   BILITOT 0.4 02/13/2023   ALKPHOS 178 (H) 02/13/2023   AST 36 02/13/2023   ALT 23 02/13/2023   ANIONGAP 10 02/13/2023    GFR: Estimated Creatinine Clearance: 56.8 mL/min (by C-G formula based on SCr of 0.48 mg/dL).  No results found for this or any previous visit (from the past 240 hour(s)).    Radiology Studies: No results found.    LOS: 58 days    Cordelia Poche, MD [facility] 02/21/2023, 10:05 AM   If 7PM-7AM, please contact night-coverage [email or web address]

## 2023-02-22 DIAGNOSIS — I619 Nontraumatic intracerebral hemorrhage, unspecified: Secondary | ICD-10-CM | POA: Diagnosis not present

## 2023-02-22 DIAGNOSIS — J9601 Acute respiratory failure with hypoxia: Secondary | ICD-10-CM | POA: Diagnosis not present

## 2023-02-22 DIAGNOSIS — G935 Compression of brain: Secondary | ICD-10-CM | POA: Diagnosis not present

## 2023-02-22 DIAGNOSIS — I612 Nontraumatic intracerebral hemorrhage in hemisphere, unspecified: Secondary | ICD-10-CM | POA: Diagnosis not present

## 2023-02-22 NOTE — Progress Notes (Signed)
PROGRESS NOTE    Pamela Johnston  [id number] DOB: [birth year] DOA: 11/29/2022 PCP: Pamela Aly, MD   Brief Narrative: Pamela Johnston is an 82 y.o. 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  Neurology and neurosurgery was consulted she was intubated and admitted to the neuro ICU started on 3% saline palliative care was consulted due to her poor prognosis.  Underwent tracheostomy as well as PEG tube placement she was also started on Keppra for concerns of seizures.  She is currently on trach collar, palliative care has had multiple discussions with the family at 1 point hospital leadership was involved she remains a full code.  Clearly if she could be placed is a month out from her tracheostomy on 01/21/2023, now in the difficult to place list.       Significant Events: 12/21 - Presented to [facility] ED via EMS as Code Stroke. LKW 2000. Abrupt onset of decreased responsiveness, aphasia and R hemiplegia. Stroke admitting, NSGY consulted. PCCM consult for vent management. Treated with unasyn for aspiration pneumonia. CT Head with large L hemispheric parietal/temporal lobe IPH with extension into basal ganglia and IVH. HTS 3% started.   12/24 - Palliative care consulted, family requested meeting to be delayed 12/25 - Family requested goals of care meeting to be delayed 12/26 - Stopped Unasyn, replete phos; [name] GOC conversation: family says full code 12/27 - Restarted Unasyn for fever, RLL infiltrate; resp culture: enterobacter, MDR. Palliative consulted  12/28 - CT Head unchanged 12/29 -  Unasyn changed to bactrim 12/31 - Posturing, CT head with increased shift, increased bleeding 1/2 - Family fired CVA service after discussion RE poor prognosis  1/4 - Off sedation  1/5 - No acute issues overnight. Remains on vent with increased respirations off sedation. Fever curve slightly decreased  1/6 - Pall care meeting moved to 2pm today. Pamela Johnston the son-in-law will be present. On ven 30%/ On TF. Neo not started last night. MAP > 65 after fluids bolus. Febrile +.  Last CT head 12/09/22.  1/8 - No significant neurologic/clinical change. Brief GOC discussion with husband, Pamela Johnston, at bedside in AM. Concerns from staff re: patient GOC/decisionmaking, want to ensure husband is included in decisions/information sharing. Attempted bedside meeting but family prematurely ended meeting due to escalated discussion. 1/9 - Febrile overnight to 101.9F, remains on bromocriptine. No significant neurologic exam changes. 1/10 Family meeting yesterday with [name], plan for trach/peg though husband says they are still discussing this morning. Per Dt. Hulen Skains " So moving forward, Pamela Johnston is the designated medical decision maker for Pamela Johnston " 1/11 no new issues, plan for PEG tomorrow 1/12 for trach/peg today 1/15 No change in Neuro status , trach and PEG, No weaning , will repeat CT head without, get Case management involved in looking for LTAC ( Day 3 post trach on 1/12)   1/15-CT with some stabilization 1/19 No acute events overnight, respiratory culture 1/15 with abundant Pseudomonas, abundant Staphylococcus aureus, and moderate Enterobacter remains on Meropenem  1/22 trach collar trial 12 + hrs 1/24 trach collar x 24 hours on 35% 1/25- concern for seizures-- keppra started  after EEG done 2/12- concern for seizures-- EEG reordered, negative 2/19 -PCCM saw for trach maintenance 2/25: ? Aspiration event with PNA 2/28-worsening fever curves, requiring cooling blanket, white count started to increase,  ID consulted due to multiresistant Pseudomonas.  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 High risk for aspiration. High degree of certainty that patient will not regain prior function. Plan for hopeful LTAC placement.  Pseudomonas aeruginosa/Enterobacter aerogenes UTI Patient completed course of Zerbaxa. Resolved.  Possible seizures Neurology consulted. EEG without evidence of seizures. Patient started on Keppra. -Continue Keppra  Acute respiratory failure with hypoxia Secondary to brain injury. Patient is s/p tracheostomy.  Sinus tachycardia Noted.  Urinary retention Foley catheter placed on 01/01/2023.  Pressure injury Medial coccyx and right eat. Not present on admission.   DVT prophylaxis: SCDs Code Status:   Code Status: Full Code Family Communication: None at bedside Disposition Plan: Discharge pending goals of care/placement   Consultants:  PCCM Neurosurgery Palliative care medicine General surgery Infectious disease  Procedures:  1/12: percutaneous tracheostomy without bronchoscopic assistance, esophagogastroduodenoscopy (EGD) and percutaneous endoscopic gastrostomy (PEG) tube placement  EEG Tracheostomy change  Antimicrobials: Ceftolozane-Tazobactam Unasyn Cefepime Meropenem Bactrim   Subjective: Non-verbal  Objective: BP 120/60 (BP Location: Right Arm)   Pulse (!) 106   Temp 99.5 F (37.5 C) (Axillary)   Resp (!) 28   Ht 5\' 1"  (1.549 m)   Wt 86 kg   SpO2 96%   BMI 35.82 kg/m   Examination:  General exam: Appears calm and comfortable. Resting in bed. No distress. Respiratory system: Clear to auscultation. Central nervous system: Not alert   Data Reviewed: I have personally reviewed following labs and imaging studies  CBC Lab  Results  Component Value Date   WBC 8.1 02/17/2023   RBC 4.06 02/17/2023   HGB 11.9 (L) 02/17/2023   HCT 38.7 02/17/2023   MCV 95.3 02/17/2023   MCH 29.3 02/17/2023   PLT 306 02/17/2023   MCHC 30.7 02/17/2023   RDW 14.8 02/17/2023   LYMPHSABS 2.0 02/17/2023   MONOABS 0.6 02/17/2023   EOSABS 0.6 (H) 02/17/2023   BASOSABS 0.0 123456     Last metabolic panel Lab Results  Component Value Date   NA 141 02/13/2023   K 4.3 02/13/2023   CL 106 02/13/2023   CO2 25 02/13/2023   BUN 26 (H) 02/13/2023   CREATININE 0.48 02/13/2023   GLUCOSE 104 (H) 02/13/2023   GFRNONAA >60 02/13/2023   CALCIUM 8.7 (L) 02/13/2023   PHOS 3.0 12/27/2022   PROT 5.9 (L) 02/13/2023   ALBUMIN 2.2 (L) 02/13/2023   BILITOT 0.4 02/13/2023   ALKPHOS 178 (H) 02/13/2023   AST 36 02/13/2023   ALT 23 02/13/2023   ANIONGAP 10 02/13/2023    GFR: Estimated Creatinine Clearance: 56.8 mL/min (by C-G formula based on SCr of 0.48 mg/dL).  No results found for this or any previous visit (from the past 240 hour(s)).    Radiology Studies: No results found.    LOS: 85 days    Cordelia Poche, MD [facility] 02/22/2023, 8:17 AM   If 7PM-7AM, please contact night-coverage [email or web address]

## 2023-02-23 DIAGNOSIS — I619 Nontraumatic intracerebral hemorrhage, unspecified: Secondary | ICD-10-CM | POA: Diagnosis not present

## 2023-02-23 DIAGNOSIS — J9601 Acute respiratory failure with hypoxia: Secondary | ICD-10-CM | POA: Diagnosis not present

## 2023-02-23 DIAGNOSIS — I612 Nontraumatic intracerebral hemorrhage in hemisphere, unspecified: Secondary | ICD-10-CM | POA: Diagnosis not present

## 2023-02-23 DIAGNOSIS — G935 Compression of brain: Secondary | ICD-10-CM | POA: Diagnosis not present

## 2023-02-23 NOTE — Progress Notes (Signed)
PROGRESS NOTE    Pamela Johnston  [id number] DOB: [birth year] DOA: 11/29/2022 PCP: [name], MD   Brief Narrative: Pamela Johnston is an 82 y.o. 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  Neurology and neurosurgery was consulted she was intubated and admitted to the neuro ICU started on 3% saline palliative care was consulted due to her poor prognosis.  Underwent tracheostomy as well as PEG tube placement she was also started on Keppra for concerns of seizures.  She is currently on trach collar, palliative care has had multiple discussions with the family at 1 point hospital leadership was involved she remains a full code.  Clearly if she could be placed is a month out from her tracheostomy on 01/21/2023, now in the difficult to place list.       Significant Events: 12/21 - Presented to [facility] ED via EMS as Code Stroke. LKW 2000. Abrupt onset of decreased responsiveness, aphasia and R hemiplegia. Stroke admitting, NSGY consulted. PCCM consult for vent management. Treated with unasyn for aspiration pneumonia. CT Head with large L hemispheric parietal/temporal lobe IPH with extension into basal ganglia and IVH. HTS 3% started.   12/24 - Palliative care consulted, family requested meeting to be delayed 12/25 - Family requested goals of care meeting to be delayed 12/26 - Stopped Unasyn, replete phos; [name] GOC conversation: family says full code 12/27 - Restarted Unasyn for fever, RLL infiltrate; resp culture: enterobacter, MDR. Palliative consulted  12/28 - CT Head unchanged 12/29 -  Unasyn changed to bactrim 12/31 - Posturing, CT head with increased shift, increased bleeding 1/2 - Family fired CVA service after discussion RE poor prognosis  1/4 - Off sedation  1/5 - No acute issues overnight. Remains on vent with increased respirations off sedation. Fever curve slightly decreased  1/6 - Pall care meeting moved to 2pm today. [name] the son-in-law will be present. On ven 30%/ On TF. Neo not started last night. MAP > 65 after fluids bolus. Febrile +.  Last CT head 12/09/22.  1/8 - No significant neurologic/clinical change. Brief GOC discussion with husband, Pamela Johnston, at bedside in AM. Concerns from staff re: patient GOC/decisionmaking, want to ensure husband is included in decisions/information sharing. Attempted bedside meeting but family prematurely ended meeting due to escalated discussion. 1/9 - Febrile overnight to 101.9F, remains on bromocriptine. No significant neurologic exam changes. 1/10 Family meeting yesterday with [name], plan for trach/peg though husband says they are still discussing this morning. Per Dt. Hulen Skains " So moving forward, Pamela Johnston is the designated medical decision maker for [name] " 1/11 no new issues, plan for PEG tomorrow 1/12 for trach/peg today 1/15 No change in Neuro status , trach and PEG, No weaning , will repeat CT head without, get Case management involved in looking for LTAC ( Day 3 post trach on 1/12)   1/15-CT with some stabilization 1/19 No acute events overnight, respiratory culture 1/15 with abundant Pseudomonas, abundant Staphylococcus aureus, and moderate Enterobacter remains on Meropenem  1/22 trach collar trial 12 + hrs 1/24 trach collar x 24 hours on 35% 1/25- concern for seizures-- keppra started  after EEG done 2/12- concern for seizures-- EEG reordered, negative 2/19 -PCCM saw for trach maintenance 2/25: ? Aspiration event with PNA 2/28-worsening fever curves, requiring cooling blanket, white count started to increase,  ID consulted due to multiresistant Pseudomonas.  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 High risk for aspiration. High degree of certainty that patient will not regain prior function. Plan for hopeful LTAC placement.  Pseudomonas aeruginosa/Enterobacter aerogenes UTI Patient completed course of Zerbaxa. Resolved.  Possible seizures Neurology consulted. EEG without evidence of seizures. Patient started on Keppra. -Continue Keppra  Acute respiratory failure with hypoxia Secondary to brain injury. Patient is s/p tracheostomy.  Sinus tachycardia Noted.  Urinary retention Foley catheter placed on 01/01/2023.  Pressure injury Medial coccyx and right eat. Not present on admission.   DVT prophylaxis: SCDs Code Status:   Code Status: Full Code Family Communication: None at bedside Disposition Plan: Discharge pending goals of care/placement   Consultants:  PCCM Neurosurgery Palliative care medicine General surgery Infectious disease  Procedures:  1/12: percutaneous tracheostomy without bronchoscopic assistance, esophagogastroduodenoscopy (EGD) and percutaneous endoscopic gastrostomy (PEG) tube placement  EEG Tracheostomy change  Antimicrobials: Ceftolozane-Tazobactam Unasyn Cefepime Meropenem Bactrim   Subjective: Non-verbal  Objective: BP (!) 119/51   Pulse 100   Temp 99.3 F (37.4 C)   Resp (!) 22   Ht 5\' 1"  (1.549 m)   Wt 86 kg   SpO2 97%   BMI 35.82 kg/m   Examination:  General exam: Appears calm and comfortable. Resting in bed. No distress. Respiratory system: Clear to auscultation. No wheezing. Non labored. Central nervous system: Not alert   Data Reviewed: I have personally reviewed following labs and imaging studies  CBC Lab Results   Component Value Date   WBC 8.1 02/17/2023   RBC 4.06 02/17/2023   HGB 11.9 (L) 02/17/2023   HCT 38.7 02/17/2023   MCV 95.3 02/17/2023   MCH 29.3 02/17/2023   PLT 306 02/17/2023   MCHC 30.7 02/17/2023   RDW 14.8 02/17/2023   LYMPHSABS 2.0 02/17/2023   MONOABS 0.6 02/17/2023   EOSABS 0.6 (H) 02/17/2023   BASOSABS 0.0 123456     Last metabolic panel Lab Results  Component Value Date   NA 141 02/13/2023   K 4.3 02/13/2023   CL 106 02/13/2023   CO2 25 02/13/2023   BUN 26 (H) 02/13/2023   CREATININE 0.48 02/13/2023   GLUCOSE 104 (H) 02/13/2023   GFRNONAA >60 02/13/2023   CALCIUM 8.7 (L) 02/13/2023   PHOS 3.0 12/27/2022   PROT 5.9 (L) 02/13/2023   ALBUMIN 2.2 (L) 02/13/2023   BILITOT 0.4 02/13/2023   ALKPHOS 178 (H) 02/13/2023   AST 36 02/13/2023   ALT 23 02/13/2023   ANIONGAP 10 02/13/2023    GFR: Estimated Creatinine Clearance: 56.8 mL/min (by C-G formula based on SCr of 0.48 mg/dL).  No results found for this or any previous visit (from the past 240 hour(s)).    Radiology Studies: No results found.    LOS: 42 days    Cordelia Poche, MD [facility] 02/23/2023, 10:55 AM   If 7PM-7AM, please contact night-coverage [email or web address]

## 2023-02-24 ENCOUNTER — Inpatient Hospital Stay (HOSPITAL_COMMUNITY): Payer: Medicare PPO

## 2023-02-24 DIAGNOSIS — I619 Nontraumatic intracerebral hemorrhage, unspecified: Secondary | ICD-10-CM | POA: Diagnosis not present

## 2023-02-24 DIAGNOSIS — I612 Nontraumatic intracerebral hemorrhage in hemisphere, unspecified: Secondary | ICD-10-CM | POA: Diagnosis not present

## 2023-02-24 DIAGNOSIS — J9601 Acute respiratory failure with hypoxia: Secondary | ICD-10-CM | POA: Diagnosis not present

## 2023-02-24 DIAGNOSIS — G935 Compression of brain: Secondary | ICD-10-CM | POA: Diagnosis not present

## 2023-02-24 NOTE — Progress Notes (Signed)
PROGRESS NOTE    Pamela Johnston  [id number] DOB: [birth year] DOA: 11/29/2022 PCP: [name], MD   Brief Narrative: Pamela Johnston is an 82 y.o. 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  Neurology and neurosurgery was consulted she was intubated and admitted to the neuro ICU started on 3% saline palliative care was consulted due to her poor prognosis.  Underwent tracheostomy as well as PEG tube placement she was also started on Keppra for concerns of seizures.  She is currently on trach collar, palliative care has had multiple discussions with the family at 1 point hospital leadership was involved she remains a full code.  Clearly if she could be placed is a month out from her tracheostomy on 01/21/2023, now in the difficult to place list.       Significant Events: 12/21 - Presented to [facility] ED via EMS as Code Stroke. LKW 2000. Abrupt onset of decreased responsiveness, aphasia and R hemiplegia. Stroke admitting, NSGY consulted. PCCM consult for vent management. Treated with unasyn for aspiration pneumonia. CT Head with large L hemispheric parietal/temporal lobe IPH with extension into basal ganglia and IVH. HTS 3% started.   12/24 - Palliative care consulted, family requested meeting to be delayed 12/25 - Family requested goals of care meeting to be delayed 12/26 - Stopped Unasyn, replete phos; [name] GOC conversation: family says full code 12/27 - Restarted Unasyn for fever, RLL infiltrate; resp culture: enterobacter, MDR. Palliative consulted  12/28 - CT Head unchanged 12/29 -  Unasyn changed to bactrim 12/31 - Posturing, CT head with increased shift, increased bleeding 1/2 - Family fired CVA service after discussion RE poor prognosis  1/4 - Off sedation  1/5 - No acute issues overnight. Remains on vent with increased respirations off sedation. Fever curve slightly decreased  1/6 - Pall care meeting moved to 2pm today. Pamela Johnston the son-in-law will be present. On ven 30%/ On TF. Neo not started last night. MAP > 65 after fluids bolus. Febrile +.  Last CT head 12/09/22.  1/8 - No significant neurologic/clinical change. Brief GOC discussion with husband, Pamela Johnston, at bedside in AM. Concerns from staff re: patient GOC/decisionmaking, want to ensure husband is included in decisions/information sharing. Attempted bedside meeting but family prematurely ended meeting due to escalated discussion. 1/9 - Febrile overnight to 101.9F, remains on bromocriptine. No significant neurologic exam changes. 1/10 Family meeting yesterday with [name], plan for trach/peg though husband says they are still discussing this morning. Per Dt. Hulen Skains " So moving forward, [name] is the designated medical decision maker for [name] " 1/11 no new issues, plan for PEG tomorrow 1/12 for trach/peg today 1/15 No change in Neuro status , trach and PEG, No weaning , will repeat CT head without, get Case management involved in looking for LTAC ( Day 3 post trach on 1/12)   1/15-CT with some stabilization 1/19 No acute events overnight, respiratory culture 1/15 with abundant Pseudomonas, abundant Staphylococcus aureus, and moderate Enterobacter remains on Meropenem  1/22 trach collar trial 12 + hrs 1/24 trach collar x 24 hours on 35% 1/25- concern for seizures-- keppra started  after EEG done 2/12- concern for seizures-- EEG reordered, negative 2/19 -PCCM saw for trach maintenance 2/25: ? Aspiration event with PNA 2/28-worsening fever curves, requiring cooling blanket, white count started to increase,  ID consulted due to multiresistant Pseudomonas.  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 High risk for aspiration. High degree of certainty that patient will not regain prior function. Plan for hopeful LTAC placement.  Pseudomonas aeruginosa/Enterobacter aerogenes UTI Patient completed course of Zerbaxa. Resolved.  Possible seizures Neurology consulted. EEG without evidence of seizures. Patient started on Keppra. -Continue Keppra  Acute respiratory failure with hypoxia Secondary to brain injury. Patient is s/p tracheostomy. -Chest x-ray  Sinus tachycardia Noted.  Urinary retention Foley catheter placed on 01/01/2023.  Pressure injury Medial coccyx and right eat. Not present on admission.   DVT prophylaxis: SCDs Code Status:   Code Status: Full Code Family Communication: None at bedside Disposition Plan: Discharge pending goals of care/placement   Consultants:  PCCM Neurosurgery Palliative care medicine General surgery Infectious disease  Procedures:  1/12: percutaneous tracheostomy without bronchoscopic assistance, esophagogastroduodenoscopy (EGD) and percutaneous endoscopic gastrostomy (PEG) tube placement  EEG Tracheostomy change  Antimicrobials: Ceftolozane-Tazobactam Unasyn Cefepime Meropenem Bactrim   Subjective: Non-verbal  Objective: BP 121/64   Pulse (!) 105   Temp 99.5 F (37.5 C) (Axillary)   Resp (!) 26   Ht 5\' 1"  (1.549 m)   Wt 86 kg   SpO2 96%   BMI 35.82 kg/m   Examination:  General exam: Appears calm and comfortable Respiratory system: Rhonchi. Respiratory effort normal. Gastrointestinal system: Abdomen is non-distended and soft   Data Reviewed: I have personally reviewed following labs and imaging studies  CBC Lab  Results  Component Value Date   WBC 8.1 02/17/2023   RBC 4.06 02/17/2023   HGB 11.9 (L) 02/17/2023   HCT 38.7 02/17/2023   MCV 95.3 02/17/2023   MCH 29.3 02/17/2023   PLT 306 02/17/2023   MCHC 30.7 02/17/2023   RDW 14.8 02/17/2023   LYMPHSABS 2.0 02/17/2023   MONOABS 0.6 02/17/2023   EOSABS 0.6 (H) 02/17/2023   BASOSABS 0.0 123456     Last metabolic panel Lab Results  Component Value Date   NA 141 02/13/2023   K 4.3 02/13/2023   CL 106 02/13/2023   CO2 25 02/13/2023   BUN 26 (H) 02/13/2023   CREATININE 0.48 02/13/2023   GLUCOSE 104 (H) 02/13/2023   GFRNONAA >60 02/13/2023   CALCIUM 8.7 (L) 02/13/2023   PHOS 3.0 12/27/2022   PROT 5.9 (L) 02/13/2023   ALBUMIN 2.2 (L) 02/13/2023   BILITOT 0.4 02/13/2023   ALKPHOS 178 (H) 02/13/2023   AST 36 02/13/2023   ALT 23 02/13/2023   ANIONGAP 10 02/13/2023    GFR: Estimated Creatinine Clearance: 56.8 mL/min (by C-G formula based on SCr of 0.48 mg/dL).  No results found for this or any previous visit (from the past 240 hour(s)).    Radiology Studies: No results found.    LOS: 44 days    Cordelia Poche, MD [facility] 02/24/2023, 9:45 AM   If 7PM-7AM, please contact night-coverage [email or web address]

## 2023-02-25 DIAGNOSIS — I619 Nontraumatic intracerebral hemorrhage, unspecified: Secondary | ICD-10-CM | POA: Diagnosis not present

## 2023-02-25 DIAGNOSIS — I612 Nontraumatic intracerebral hemorrhage in hemisphere, unspecified: Secondary | ICD-10-CM | POA: Diagnosis not present

## 2023-02-25 DIAGNOSIS — Z930 Tracheostomy status: Secondary | ICD-10-CM | POA: Diagnosis not present

## 2023-02-25 DIAGNOSIS — J9601 Acute respiratory failure with hypoxia: Secondary | ICD-10-CM | POA: Diagnosis not present

## 2023-02-25 DIAGNOSIS — G935 Compression of brain: Secondary | ICD-10-CM | POA: Diagnosis not present

## 2023-02-25 NOTE — Progress Notes (Cosign Needed)
CSW spoke with Dr. Ree Kida to discuss insurance issues that are prohibiting placement at this time. Dr. Ree Kida will attempt to contact [facility] Director for discussion.  Madilyn Fireman, MSW, LCSW Transitions of Care  Clinical Social Worker II [phone]

## 2023-02-25 NOTE — Consult Note (Signed)
[facility] Nurse wound follow up Wound type: Pressure Injury Coccyx  Measurement: 0.5 cms x 0.5 cms x 0.1 cm  Wound bed:100% pink moist  Drainage (amount, consistency, odor) minimal serosanguinous  Periwound: intact, some moisture associated skin damage noted to perineal area (Gerhardts Butt Cream order in place)  Dressing procedure/placement/frequency: DC Medihoney, place a single layer of Xeroform gauze Kellie Simmering [phone]) to wound daily, cover with foam dressing.  May lift foam daily to replace Xeroform gauze, change foam dressings q3 days and prn soiling.   Patient currently on low air loss mattress for pressure redistribution and moisture management.    WOC will follow this patient weekly for assessment and management.    Thank you,    Desere Gwin MSN, RN-BC, [employer]

## 2023-02-25 NOTE — Progress Notes (Signed)
NAME:  AVALYN STREBE, MRN:  [id number], DOB:  09/28/1941, LOS: 88 ADMISSION DATE:  11/29/2022, CONSULTATION DATE:  11/29/2022 REFERRING MD:  Leonel Ramsay - Neuro REASON FOR CONSULT: Ventilator management in setting of large IPH  History of Present Illness:  10-yer-old woman who presented to [facility] ED 12/21 as a Code Stroke. LKW 2000 when patient reportedly was in bed and became less responsive with +aphasia and R-sided weakness. One episode of nausea/vomiting en route with EMS. PMHx significant for HTN, chronic sacroiliitis (managed with Percocet), RLS, osteopenia.   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 Labs were grossly unremarkable with WBC mildly elevated to 12 and mild hyperglycemia to 154. Decision was made to intubate patient in ED after several episodes of emesis with concern for aspiration. HTS 3% was initiated.   Stroke service to admit. NSGY consulted with no plan for intervention at this time.   PCCM consulted for ventilator/hemodynamic management.   Pertinent Medical History:  Hypertension Sacroilititis Restless legs syndrome Osteopenia  Significant Hospital Events: Including procedures, antibiotic start and stop dates in addition to other pertinent events   12/21 - Presented to [facility] ED via EMS as Code Stroke. LKW 2000. Abrupt onset of decreased responsiveness, aphasia and R hemiplegia. Stroke admitting, NSGY consulted. PCCM consult for vent management. Treated with unasyn for aspiration pneumonia. CT Head with large L hemispheric parietal/temporal lobe IPH with extension into basal ganglia and IVH. HTS 3% started.   12/24 - Palliative care consulted, family requested meeting to be delayed 12/25 - Family requested goals of care meeting to be delayed 12/26 - Stopped  Unasyn, replete phos; [name] GOC conversation: family says full code 12/27 - Restarted Unasyn for fever, RLL infiltrate; resp culture: enterobacter, MDR. Palliative consulted  12/28 - CT Head unchanged 12/29 - Unasyn changed to bactrim 12/31 - Posturing, CT head with increased shift, increased bleeding 1/2 - Family fired CVA service after discussion RE poor prognosis  1/4 - Off sedation  1/5 - No acute issues overnight. Remains on vent with increased respirations off sedation. Fever curve slightly decreased  1/6 - Pall care meeting moved to 2pm today. [name] the son-in-law will be present. On ven 30%/ On TF. Neo not started last night. MAP > 65 after fluids bolus. Febrile +.  Last CT head 12/09/22.  1/8 - No significant neurologic/clinical change. Brief GOC discussion with husband, [name], at bedside in AM. Concerns from staff re: patient GOC/decisionmaking, want to ensure husband is included in decisions/information sharing. Attempted bedside meeting but family prematurely ended meeting due to escalated discussion. 1/9 - Febrile overnight to 101.7F, remains on bromocriptine. No significant neurologic exam changes. 1/10 Family meeting yesterday with [name], plan for trach/peg though husband says they are still discussing this morning. Per Dt. Hulen Skains " So moving forward, [name] is the designated medical decision maker for Areli Weir " 1/11 no new issues, plan for PEG tomorrow 1/12 for trach/peg today 1/15 No change in Neuro status , trach and PEG, No weaning , will repeat CT head without, get Case management involved in looking for LTAC ( Day 3 post trach on 1/12)   1/15-CT with some stabilization 1/19 No acute events overnight, respiratory culture 1/15 with abundant Pseudomonas, abundant Staphylococcus aureus, and moderate Enterobacter remains on Meropenem  1/22 trach collar trial 12 +  hrs 1/24 trach collar x 24 hours on 35% 1/29 order placed to change trach to cuffless  2/12 No issues  on ATC collar with room air not oxygen  2/19 tolerating trach collar, remains unresponsive 2/26 tolerating 21% Trach collar. Transfered out of ICU. 3/18 ATC   Interim History / Subjective:   Continues on ATC   Objective:   Blood pressure (!) 112/57, pulse 98, temperature 99.1 F (37.3 C), temperature source Oral, resp. rate (!) 29, height 5\' 1"  (1.549 m), weight 86 kg, SpO2 98 %.    FiO2 (%):  [28 %] 28 %   Intake/Output Summary (Last 24 hours) at 02/25/2023 1401 Last data filed at 02/24/2023 2258 Gross per 24 hour  Intake --  Output 601 ml  Net -601 ml   Filed Weights   02/12/23 0500 02/17/23 0706 02/21/23 0713  Weight: 43 kg 65.3 kg 86 kg   Physical Exam: General: chronically ill debilitated appearing elderly F  HEENT: NCAT. Trach secure  Neuro: 71mm pupils does not follow commands  CV: rr  PULM:  symmetrical chest expansion  GI: soft  Extremities: Contractures  Skin: c/d/w. Multiple padded foam dressings    Resolved Hospital Problem List:  Hypotension Hyperglycemia  Enterobacter pneumonia -Completed 10 days of treatment for  Circulatory shock  Assessment & Plan:    Devastating L IPH with brain compression  Acute respiratory failure with hypoxia Tracheostomy dependence Pseudomonas colonization  -completed 7d zerbaxa per ID for MDR pseudomonas on 3/6 -6 cuffless  P -unlikely to ever be a decannulation candidate -cont trach care, pulm hygiene -PRN imaging  -high risk for recurrent infections    Best Practice (right click and "Reselect all SmartList Selections" daily)  Per primary   Critical care: N/A    Eliseo Gum MSN, AGACNP-BC [place] for pager  02/25/2023, 2:01 PM

## 2023-02-25 NOTE — Progress Notes (Signed)
PROGRESS NOTE    Pamela Johnston  [id number] DOB: [birth year] DOA: 11/29/2022 PCP: [name], MD   Brief Narrative: Pamela Johnston is an 82 y.o. 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  Neurology and neurosurgery was consulted she was intubated and admitted to the neuro ICU started on 3% saline palliative care was consulted due to her poor prognosis.  Underwent tracheostomy as well as PEG tube placement she was also started on Keppra for concerns of seizures.  She is currently on trach collar, palliative care has had multiple discussions with the family at 1 point hospital leadership was involved she remains a full code.  Clearly if she could be placed is a month out from her tracheostomy on 01/21/2023, now in the difficult to place list.       Significant Events: 12/21 - Presented to [facility] ED via EMS as Code Stroke. LKW 2000. Abrupt onset of decreased responsiveness, aphasia and R hemiplegia. Stroke admitting, NSGY consulted. PCCM consult for vent management. Treated with unasyn for aspiration pneumonia. CT Head with large L hemispheric parietal/temporal lobe IPH with extension into basal ganglia and IVH. HTS 3% started.   12/24 - Palliative care consulted, family requested meeting to be delayed 12/25 - Family requested goals of care meeting to be delayed 12/26 - Stopped Unasyn, replete phos; [name] GOC conversation: family says full code 12/27 - Restarted Unasyn for fever, RLL infiltrate; resp culture: enterobacter, MDR. Palliative consulted  12/28 - CT Head unchanged 12/29 -  Unasyn changed to bactrim 12/31 - Posturing, CT head with increased shift, increased bleeding 1/2 - Family fired CVA service after discussion RE poor prognosis  1/4 - Off sedation  1/5 - No acute issues overnight. Remains on vent with increased respirations off sedation. Fever curve slightly decreased  1/6 - Pall care meeting moved to 2pm today. [name] the son-in-law will be present. On ven 30%/ On TF. Neo not started last night. MAP > 65 after fluids bolus. Febrile +.  Last CT head 12/09/22.  1/8 - No significant neurologic/clinical change. Brief GOC discussion with husband, Pamela Johnston, at bedside in AM. Concerns from staff re: patient GOC/decisionmaking, want to ensure husband is included in decisions/information sharing. Attempted bedside meeting but family prematurely ended meeting due to escalated discussion. 1/9 - Febrile overnight to 101.9F, remains on bromocriptine. No significant neurologic exam changes. 1/10 Family meeting yesterday with [name], plan for trach/peg though husband says they are still discussing this morning. Per Dt. Hulen Skains " So moving forward, Pamela Johnston is the designated medical decision maker for Pamela Johnston " 1/11 no new issues, plan for PEG tomorrow 1/12 for trach/peg today 1/15 No change in Neuro status , trach and PEG, No weaning , will repeat CT head without, get Case management involved in looking for LTAC ( Day 3 post trach on 1/12)   1/15-CT with some stabilization 1/19 No acute events overnight, respiratory culture 1/15 with abundant Pseudomonas, abundant Staphylococcus aureus, and moderate Enterobacter remains on Meropenem  1/22 trach collar trial 12 + hrs 1/24 trach collar x 24 hours on 35% 1/25- concern for seizures-- keppra started  after EEG done 2/12- concern for seizures-- EEG reordered, negative 2/19 -PCCM saw for trach maintenance 2/25: ? Aspiration event with PNA 2/28-worsening fever curves, requiring cooling blanket, white count started to increase,  ID consulted due to multiresistant Pseudomonas.  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 High risk for aspiration. High degree of certainty that patient will not regain prior function. Plan for hopeful LTAC placement.  Pseudomonas aeruginosa/Enterobacter aerogenes UTI Patient completed course of Zerbaxa. Resolved.  Possible seizures Neurology consulted. EEG without evidence of seizures. Patient started on Keppra. -Continue Keppra  Acute respiratory failure with hypoxia Secondary to brain injury. Patient is s/p tracheostomy. Chest x-ray without evidence of new pneumonia. -Continue tracheostomy care and supplemental oxygen  Sinus tachycardia Noted.  Urinary retention Foley catheter placed on 01/01/2023.  Pressure injury Medial coccyx and right eat. Not present on admission.   DVT prophylaxis: SCDs Code Status:   Code Status: Full Code Family Communication: None at bedside Disposition Plan: Discharge pending goals of care/placement   Consultants:  PCCM Neurosurgery Palliative care medicine General surgery Infectious disease  Procedures:  1/12: percutaneous tracheostomy without bronchoscopic assistance, esophagogastroduodenoscopy (EGD) and percutaneous endoscopic gastrostomy (PEG) tube placement  EEG Tracheostomy change  Antimicrobials: Ceftolozane-Tazobactam Unasyn Cefepime Meropenem Bactrim   Subjective: Non-verbal  Objective: BP 109/81 (BP Location: Left Arm)   Pulse (!) 111   Temp 99.6 F (37.6 C) (Axillary)   Resp (!) 25   Ht 5\' 1"  (1.549 m)   Wt 86 kg   SpO2 95%   BMI 35.82 kg/m   Examination:  General exam: Appears calm and comfortable Respiratory system: transmitted rhonchi. Respiratory effort normal. Cardiovascular system: S1 & S2 heard,  tachycardia. Gastrointestinal system: Abdomen is nondistended, soft and nontender. Normal bowel sounds heard. Musculoskeletal: No edema. No calf tenderness   Data Reviewed: I have personally reviewed following labs and imaging studies  CBC Lab Results  Component Value Date   WBC 8.1 02/17/2023   RBC 4.06 02/17/2023   HGB 11.9 (L) 02/17/2023   HCT 38.7 02/17/2023   MCV 95.3 02/17/2023   MCH 29.3 02/17/2023   PLT 306 02/17/2023   MCHC 30.7 02/17/2023   RDW 14.8 02/17/2023   LYMPHSABS 2.0 02/17/2023   MONOABS 0.6 02/17/2023   EOSABS 0.6 (H) 02/17/2023   BASOSABS 0.0 123456     Last metabolic panel Lab Results  Component Value Date   NA 141 02/13/2023   K 4.3 02/13/2023   CL 106 02/13/2023   CO2 25 02/13/2023   BUN 26 (H) 02/13/2023   CREATININE 0.48 02/13/2023   GLUCOSE 104 (H) 02/13/2023   GFRNONAA >60 02/13/2023   CALCIUM 8.7 (L) 02/13/2023   PHOS 3.0 12/27/2022   PROT 5.9 (L) 02/13/2023   ALBUMIN 2.2 (L) 02/13/2023   BILITOT 0.4 02/13/2023   ALKPHOS 178 (H) 02/13/2023   AST 36 02/13/2023   ALT 23 02/13/2023   ANIONGAP 10 02/13/2023    GFR: Estimated Creatinine Clearance: 56.8 mL/min (by C-G formula based on SCr of 0.48 mg/dL).  No results found for this or any previous visit (from the past 240 hour(s)).    Radiology Studies: DG CHEST PORT 1 VIEW  Result Date: 02/24/2023 CLINICAL DATA:  Increased breath sounds in the bases EXAM: PORTABLE CHEST 1 VIEW COMPARISON:  02/16/2023 FINDINGS: Cardiac shadow is stable.  Tracheostomy tube is again seen. The overall inspiratory effort is stable. Bibasilar atelectasis is noted right slightly greater than left increased from the prior exam. No bone abnormality is noted. IMPRESSION: Increasing bibasilar atelectasis Electronically Signed   By: Inez Catalina M.D.   On: 02/24/2023 12:05      LOS: 88 days    Cordelia Poche, MD [facility] 02/25/2023, 7:40 AM   If 7PM-7AM, please contact  night-coverage [email or web address]

## 2023-02-26 DIAGNOSIS — J9601 Acute respiratory failure with hypoxia: Secondary | ICD-10-CM | POA: Diagnosis not present

## 2023-02-26 DIAGNOSIS — G935 Compression of brain: Secondary | ICD-10-CM | POA: Diagnosis not present

## 2023-02-26 DIAGNOSIS — I619 Nontraumatic intracerebral hemorrhage, unspecified: Secondary | ICD-10-CM | POA: Diagnosis not present

## 2023-02-26 DIAGNOSIS — I612 Nontraumatic intracerebral hemorrhage in hemisphere, unspecified: Secondary | ICD-10-CM | POA: Diagnosis not present

## 2023-02-26 NOTE — Progress Notes (Signed)
PROGRESS NOTE    Pamela Johnston  [id number] DOB: [birth year] DOA: 11/29/2022 PCP: [name], MD   Brief Narrative: Pamela Johnston is an 82 y.o. 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  Neurology and neurosurgery was consulted she was intubated and admitted to the neuro ICU started on 3% saline palliative care was consulted due to her poor prognosis.  Underwent tracheostomy as well as PEG tube placement she was also started on Keppra for concerns of seizures.  She is currently on trach collar, palliative care has had multiple discussions with the family at 1 point hospital leadership was involved she remains a full code.  Clearly if she could be placed is a month out from her tracheostomy on 01/21/2023, now in the difficult to place list.       Significant Events: 12/21 - Presented to [facility] ED via EMS as Code Stroke. LKW 2000. Abrupt onset of decreased responsiveness, aphasia and R hemiplegia. Stroke admitting, NSGY consulted. PCCM consult for vent management. Treated with unasyn for aspiration pneumonia. CT Head with large L hemispheric parietal/temporal lobe IPH with extension into basal ganglia and IVH. HTS 3% started.   12/24 - Palliative care consulted, family requested meeting to be delayed 12/25 - Family requested goals of care meeting to be delayed 12/26 - Stopped Unasyn, replete phos; [name] GOC conversation: family says full code 12/27 - Restarted Unasyn for fever, RLL infiltrate; resp culture: enterobacter, MDR. Palliative consulted  12/28 - CT Head unchanged 12/29 -  Unasyn changed to bactrim 12/31 - Posturing, CT head with increased shift, increased bleeding 1/2 - Family fired CVA service after discussion RE poor prognosis  1/4 - Off sedation  1/5 - No acute issues overnight. Remains on vent with increased respirations off sedation. Fever curve slightly decreased  1/6 - Pall care meeting moved to 2pm today. [name] the son-in-law will be present. On ven 30%/ On TF. Neo not started last night. MAP > 65 after fluids bolus. Febrile +.  Last CT head 12/09/22.  1/8 - No significant neurologic/clinical change. Brief GOC discussion with husband, [name], at bedside in AM. Concerns from staff re: patient GOC/decisionmaking, want to ensure husband is included in decisions/information sharing. Attempted bedside meeting but family prematurely ended meeting due to escalated discussion. 1/9 - Febrile overnight to 101.9F, remains on bromocriptine. No significant neurologic exam changes. 1/10 Family meeting yesterday with [name], plan for trach/peg though husband says they are still discussing this morning. Per Dt. Hulen Skains " So moving forward, [name] is the designated medical decision maker for [name] " 1/11 no new issues, plan for PEG tomorrow 1/12 for trach/peg today 1/15 No change in Neuro status , trach and PEG, No weaning , will repeat CT head without, get Case management involved in looking for LTAC ( Day 3 post trach on 1/12)   1/15-CT with some stabilization 1/19 No acute events overnight, respiratory culture 1/15 with abundant Pseudomonas, abundant Staphylococcus aureus, and moderate Enterobacter remains on Meropenem  1/22 trach collar trial 12 + hrs 1/24 trach collar x 24 hours on 35% 1/25- concern for seizures-- keppra started  after EEG done 2/12- concern for seizures-- EEG reordered, negative 2/19 -PCCM saw for trach maintenance 2/25: ? Aspiration event with PNA 2/28-worsening fever curves, requiring cooling blanket, white count started to increase,  ID consulted due to multiresistant Pseudomonas.  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 High risk for aspiration. High degree of certainty that patient will not regain prior function. Plan for hopeful LTAC placement.  Pseudomonas aeruginosa/Enterobacter aerogenes UTI Patient completed course of Zerbaxa. Resolved.  Possible seizures Neurology consulted. EEG without evidence of seizures. Patient started on Keppra. -Continue Keppra  Acute respiratory failure with hypoxia Secondary to brain injury. Patient is s/p tracheostomy. Chest x-ray without evidence of new pneumonia. -Continue tracheostomy care and supplemental oxygen  Sinus tachycardia Noted.  Urinary retention Foley catheter placed on 01/01/2023.  Pressure injury Medial coccyx and right eat. Not present on admission.   DVT prophylaxis: SCDs Code Status:   Code Status: Full Code Family Communication: None at bedside Disposition Plan: Discharge pending goals of care/placement   Consultants:  PCCM Neurosurgery Palliative care medicine General surgery Infectious disease  Procedures:  1/12: percutaneous tracheostomy without bronchoscopic assistance, esophagogastroduodenoscopy (EGD) and percutaneous endoscopic gastrostomy (PEG) tube placement  EEG Tracheostomy change  Antimicrobials: Ceftolozane-Tazobactam Unasyn Cefepime Meropenem Bactrim   Subjective: Non-verbal  Objective: BP 114/61 (BP Location: Left Arm)   Pulse (!) 112   Temp 99.4 F (37.4 C) (Axillary)   Resp (!) 27   Ht 5\' 1"  (1.549 m)   Wt 86 kg   SpO2 96%   BMI 35.82 kg/m   Examination:  General exam: Appears calm and comfortable. Resting in bed. Respiratory system: Respiratory effort norma   Data Reviewed: I  have personally reviewed following labs and imaging studies  CBC Lab Results  Component Value Date   WBC 8.1 02/17/2023   RBC 4.06 02/17/2023   HGB 11.9 (L) 02/17/2023   HCT 38.7 02/17/2023   MCV 95.3 02/17/2023   MCH 29.3 02/17/2023   PLT 306 02/17/2023   MCHC 30.7 02/17/2023   RDW 14.8 02/17/2023   LYMPHSABS 2.0 02/17/2023   MONOABS 0.6 02/17/2023   EOSABS 0.6 (H) 02/17/2023   BASOSABS 0.0 [id number]     Last metabolic panel Lab Results  Component Value Date   NA 141 02/13/2023   K 4.3 02/13/2023   CL 106 02/13/2023   CO2 25 02/13/2023   BUN 26 (H) 02/13/2023   CREATININE 0.48 02/13/2023   GLUCOSE 104 (H) 02/13/2023   GFRNONAA >60 02/13/2023   CALCIUM 8.7 (L) 02/13/2023   PHOS 3.0 12/27/2022   PROT 5.9 (L) 02/13/2023   ALBUMIN 2.2 (L) 02/13/2023   BILITOT 0.4 02/13/2023   ALKPHOS 178 (H) 02/13/2023   AST 36 02/13/2023   ALT 23 02/13/2023   ANIONGAP 10 02/13/2023    GFR: Estimated Creatinine Clearance: 56.8 mL/min (by C-G formula based on SCr of 0.48 mg/dL).  No results found for this or any previous visit (from the past 240 hour(s)).    Radiology Studies: No results found.    LOS: 28 days    Cordelia Poche, MD [facility] 02/26/2023, 11:06 AM   If 7PM-7AM, please contact night-coverage [email or web address]

## 2023-02-26 NOTE — Progress Notes (Addendum)
Physical Therapy Treatment Patient Details Name: Pamela Johnston MRN: [id number] DOB: 1941/02/26 Today's Date: 02/26/2023   History of Present Illness 81-yer-old woman who presented to [facility] ED 12/21 as a Code Stroke with +aphasia and R-sided weakness. 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 Pt underwent trach and peg on 12/19/22. PMHx significant for HTN, chronic sacroiliitis (managed with Percocet), RLS, osteopenia.    PT Comments    Patient noted to track therapist visually today usually R gaze, but noted attention at midline and to L as well with cues and increased time and assist for head control in sitting.  She localizes pain with ROM via facial grimacing and mild increase in RR to L shoulder, R knee and hip and L knee.  She continues with contractures worse in L knee and hip and R shoulder and hand/fingers.  She would benefit from skilled PT at SNF for mobilizing extremities, work for cognitive recovery with increased attention and progressing sitting balance.  PT will follow if remains in acute setting as well.    Recommendations for follow up therapy are one component of a multi-disciplinary discharge planning process, led by the attending physician.  Recommendations may be updated based on patient status, additional functional criteria and insurance authorization.  Follow Up Recommendations  Skilled nursing-short term rehab (<3 hours/day) Can patient physically be transported by private vehicle: No   Assistance Recommended at Discharge Frequent or constant Supervision/Assistance  Patient can return home with the following Two people to help with walking and/or transfers;Two people to help with bathing/dressing/bathroom;Assistance with feeding;Assist for transportation;Direct supervision/assist for medications management   Equipment Recommendations  None recommended  by PT    Recommendations for Other Services       Precautions / Restrictions Precautions Precautions: Fall Precaution Comments: trach, PEG, abdominal binder Restrictions Weight Bearing Restrictions: No     Mobility  Bed Mobility Overal bed mobility: Needs Assistance Bed Mobility: Supine to Sit, Sit to Supine     Supine to sit: Total assist, HOB elevated Sit to supine: Total assist, +2 for physical assistance   General bed mobility comments: assist for legs off bed and to lift trunk, already turned to L side with wedge after nursing care earlier; to supine assist for legs and trunk and RN in to assist with scooting up in bed and placing wedge    Transfers                   General transfer comment: NT    Ambulation/Gait                   Stairs             Wheelchair Mobility    Modified Rankin (Stroke Patients Only) Modified Rankin (Stroke Patients Only) Pre-Morbid Rankin Score: Moderately severe disability Modified Rankin: Severe disability     Balance Overall balance assessment: Needs assistance Sitting-balance support: Feet supported, No upper extremity supported Sitting balance-Leahy Scale: Zero Sitting balance - Comments: sitting with feet on floor and assist for trunk forward and head upright x about 6 minutes then pt balanced with keeping trunk forward and continued assist for head upright x 2-3 more mintues even during head rotation and cues for visual fixation on therapist  Cognition Arousal/Alertness: Lethargic (eyes open with mobility and ROM) Behavior During Therapy: Flat affect Overall Cognitive Status: Impaired/Different from baseline                                 General Comments: did follow therapist with eyes to command to midline and past midline to R with increased time and assist for head control in sitting. grimacing to moving L knee into extension and  hip external rotation and to R knee into flexion and R shoulder into external rotation and elbow flexion with mild increase in respiratory rate        Exercises Other Exercises Other Exercises: PROM x4 extremities in supine noted limitations in L knee extension, L hip abduction/ER L elbow extension and pronation and R shoulder flexion/ER/ABD and elbow flexion Other Exercises: seated performed PROM rotation L & R to cervical spine and some lateral trunk flexion for side stretches L and R    General Comments General comments (skin integrity, edema, etc.): VSS with mobility slight increase in RR during some ROM activities; maintained on 28% trach collar throughout      Pertinent Vitals/Pain Pain Assessment Breathing: occasional labored breathing, short period of hyperventilation Negative Vocalization: none Facial Expression: sad, frightened, frown Body Language: tense, distressed pacing, fidgeting Consolability: distracted or reassured by voice/touch PAINAD Score: 4 Pain Location: grimacing at times with ROM of LE's and L UE Pain Descriptors / Indicators: Grimacing Pain Intervention(s): Monitored during session, Limited activity within patient's tolerance, Repositioned    Home Living                          Prior Function            PT Goals (current goals can now be found in the care plan section) Progress towards PT goals: Progressing toward goals    Frequency    Min 2X/week      PT Plan Discharge plan needs to be updated;Frequency needs to be updated    Co-evaluation              AM-PAC PT "6 Clicks" Mobility   Outcome Measure  Help needed turning from your back to your side while in a flat bed without using bedrails?: Total Help needed moving from lying on your back to sitting on the side of a flat bed without using bedrails?: Total Help needed moving to and from a bed to a chair (including a wheelchair)?: Total Help needed standing up from a  chair using your arms (e.g., wheelchair or bedside chair)?: Total Help needed to walk in hospital room?: Total Help needed climbing 3-5 steps with a railing? : Total 6 Click Score: 6    End of Session Equipment Utilized During Treatment: Oxygen Activity Tolerance: Patient tolerated treatment well Patient left: in bed   PT Visit Diagnosis: Difficulty in walking, not elsewhere classified (R26.2);Unsteadiness on feet (R26.81)     Time: [id number] PT Time Calculation (min) (ACUTE ONLY): 34 min  Charges:  $Therapeutic Exercise: 8-22 mins $Therapeutic Activity: 8-22 mins                     Magda Kiel, PT Acute Rehabilitation Services [phone] 02/26/2023    Reginia Naas 02/26/2023, 10:27 AM

## 2023-02-26 NOTE — Progress Notes (Addendum)
11:55am: CSW received call from [place] from [facility] who states patient's insurance authorization request is being sent to the Medical Director for review.  10:45am: CSW submitted for insurance authorization.  CSW spoke with Loree Fee at [facility] to inform her of updates.  8:45am: CSW will initiate new insurance authorization for SNF once updated therapy notes are available.  Madilyn Fireman, MSW, LCSW Transitions of Care  Clinical Social Worker II [phone]

## 2023-02-27 DIAGNOSIS — I619 Nontraumatic intracerebral hemorrhage, unspecified: Secondary | ICD-10-CM | POA: Diagnosis not present

## 2023-02-27 DIAGNOSIS — T17908A Unspecified foreign body in respiratory tract, part unspecified causing other injury, initial encounter: Secondary | ICD-10-CM | POA: Diagnosis not present

## 2023-02-27 DIAGNOSIS — I612 Nontraumatic intracerebral hemorrhage in hemisphere, unspecified: Secondary | ICD-10-CM | POA: Diagnosis not present

## 2023-02-27 MED ORDER — FAMOTIDINE 40 MG/5ML PO SUSR: 20.0000 mg | Freq: Every day | ORAL | 0 refills | Status: AC

## 2023-02-27 MED ORDER — BANATROL TF EN LIQD: 60.0000 mL | Freq: Two times a day (BID) | ENTERAL | Status: AC

## 2023-02-27 MED ORDER — PHENYLEPHRINE-MINERAL OIL-PET 0.25-14-74.9 % RE OINT: 1.0000 | TOPICAL_OINTMENT | Freq: Two times a day (BID) | RECTAL | Status: AC

## 2023-02-27 MED ORDER — JEVITY 1.2 CAL PO LIQD: 1000.0000 mL | ORAL | 0 refills | Status: AC

## 2023-02-27 MED ORDER — LEVETIRACETAM 500 MG PO TABS: 500.0000 mg | ORAL_TABLET | Freq: Two times a day (BID) | ORAL | Status: AC

## 2023-02-27 MED ORDER — ROSUVASTATIN CALCIUM 20 MG PO TABS: 20.0000 mg | ORAL_TABLET | Freq: Every day | ORAL | Status: AC

## 2023-02-27 MED ORDER — PROSOURCE TF20 ENFIT COMPATIBL EN LIQD: 60.0000 mL | Freq: Every day | ENTERAL | Status: AC

## 2023-02-27 MED ORDER — ACETAMINOPHEN 160 MG/5ML PO SOLN: 650.0000 mg | ORAL | 0 refills | Status: AC | PRN

## 2023-02-27 MED ORDER — DOCUSATE SODIUM 50 MG/5ML PO LIQD: 100.0000 mg | Freq: Two times a day (BID) | ORAL | 0 refills | Status: AC | PRN

## 2023-02-27 MED ORDER — JUVEN PO PACK: 1.0000 | PACK | Freq: Two times a day (BID) | ORAL | 0 refills | Status: AC

## 2023-02-27 MED ORDER — ZINC OXIDE 40 % EX OINT
TOPICAL_OINTMENT | Freq: Two times a day (BID) | CUTANEOUS | Status: DC
  Filled 2023-02-27: qty 57

## 2023-02-27 MED ORDER — METOPROLOL TARTRATE 25 MG PO TABS: 25.0000 mg | ORAL_TABLET | Freq: Two times a day (BID) | ORAL | Status: AC

## 2023-02-27 MED ORDER — GERHARDT'S BUTT CREAM: 1.0000 | TOPICAL_CREAM | Freq: Four times a day (QID) | CUTANEOUS | Status: AC

## 2023-02-27 NOTE — Progress Notes (Signed)
Tried calling report and there was no answer.

## 2023-02-27 NOTE — Progress Notes (Signed)
Tried calling report to [facility] and no answer. Will attempt  to call report again.

## 2023-02-27 NOTE — Progress Notes (Signed)
[facility] and gave report to [place]. No questions at this time.

## 2023-02-27 NOTE — Progress Notes (Signed)
[facility] PROGRESS NOTE    Progress Note  LENKA JURS  [id number] DOB: September 07, 1941 DOA: 11/29/2022 PCP: [name], MD     Brief Narrative:   Pamela Johnston is an 82 y.o. 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  Neurology and neurosurgery was consulted she was intubated and admitted to the neuro ICU started on 3% saline palliative care was consulted due to her poor prognosis.  Underwent tracheostomy as well as PEG tube placement she was also started on Keppra for concerns of seizures.  She is currently on trach collar, palliative care has had multiple discussions with the family at 1 point hospital leadership was involved she remains a full code.  Clearly if she could be placed is a month out from her tracheostomy on 01/21/2023, now in the difficult to place list.  Significant Events: 12/21 - Presented to [facility] ED via EMS as Code Stroke. LKW 2000. Abrupt onset of decreased responsiveness, aphasia and R hemiplegia. Stroke admitting, NSGY consulted. PCCM consult for vent management. Treated with unasyn for aspiration pneumonia. CT Head with large L hemispheric parietal/temporal lobe IPH with extension into basal ganglia and IVH. HTS 3% started.   12/24 - Palliative care consulted, family requested meeting to be delayed 12/25 - Family requested goals of care meeting to be delayed 12/26 - Stopped Unasyn, replete phos; [name] GOC conversation: family says full code 12/27 - Restarted Unasyn for fever, RLL infiltrate; resp culture: enterobacter, MDR. Palliative consulted  12/28  - CT Head unchanged 12/29 - Unasyn changed to bactrim 12/31 - Posturing, CT head with increased shift, increased bleeding 1/2 - Family fired CVA service after discussion RE poor prognosis  1/4 - Off sedation  1/5 - No acute issues overnight. Remains on vent with increased respirations off sedation. Fever curve slightly decreased  1/6 - Pall care meeting moved to 2pm today. [name] the son-in-law will be present. On ven 30%/ On TF. Neo not started last night. MAP > 65 after fluids bolus. Febrile +.  Last CT head 12/09/22.  1/8 - No significant neurologic/clinical change. Brief GOC discussion with husband, [name], at bedside in AM. Concerns from staff re: patient GOC/decisionmaking, want to ensure husband is included in decisions/information sharing. Attempted bedside meeting but family prematurely ended meeting due to escalated discussion. 1/9 - Febrile overnight to 101.40F, remains on bromocriptine. No significant neurologic exam changes. 1/10 Family meeting yesterday with [name], plan for trach/peg though husband says they are still discussing this morning. Per Dt. Hulen Skains " So moving forward, [name] is the designated medical decision maker for Kristel Hooser " 1/11 no new issues, plan for PEG tomorrow 1/12 for trach/peg today 1/15 No change in Neuro status , trach and PEG, No weaning , will repeat CT head without, get Case management involved in looking for LTAC ( Day 3 post trach on 1/12)   1/15-CT with some stabilization 1/19 No acute events overnight, respiratory culture 1/15 with abundant Pseudomonas, abundant Staphylococcus aureus, and moderate Enterobacter remains on Meropenem  1/22 trach collar trial 12 + hrs 1/24 trach collar x 24 hours on 35% 1/25- concern for  seizures-- keppra started after EEG done 2/12- concern for seizures-- EEG reordered, negative 2/19 -PCCM saw for trach maintenance 2/25: ? Aspiration event with PNA 2/28-worsening fever curves, requiring cooling blanket,  white count started to increase, ID consulted due to multiresistant Pseudomonas.  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   Medical decision making is [name] patient's son-in-law. Remains a full code, stroke team has signed off. She is at high risk of aspiration. She has no chance of recovery. TOC has been consulted for LTAC placement.  Awaiting insurance authorization.  Fevers of unknown source: On 12/07/2023 she developed worsening fevers and leukocytosis. ID was consulted recommended 7 days course of Zerbaxa completed on 02/13/2023.  Concerns for seizures: On Keppra underwent EEG on 11/21/2023 due to left-sided rhythmic movements. EEG showed no seizures neurology has now signed off. No events.  Acute respiratory failure with hypoxia due to large intracerebral hemorrhage: Status post trach and PEG on 12/21/2022. PCCM following intermittently. Currently satting greater 97% on trach collar FiO2 of 28 with a flow rate of 6L.  Sinus tachycardia: Fluctuates, has episodes of sinus tach and short episodes of sinus tachycardia. Has remained afebrile.  No leukocytosis. Will continue to monitor closely.    Urinary retention: Foley placed on 01/01/2023 will need urologic evaluation as an outpatient.  History of chronic sacroiliitis/osteopenia/RLS: Continue supportive care.  Stage I significant results are/right ear ulcer RN Pressure Injury Documentation: Pressure Injury 12/22/22 Ear Right Stage 1 -  Intact skin with non-blanchable redness of a localized area usually over a bony prominence. (Active)  12/22/22 1816  Location: Ear  Location Orientation: Right  Staging: Stage 1 -  Intact skin  with non-blanchable redness of a localized area usually over a bony prominence.  Wound Description (Comments):   Present on Admission: No  Dressing Type None 02/26/23 2000     Pressure Injury 02/16/23 Coccyx Medial Unstageable - Full thickness tissue loss in which the base of the injury is covered by slough (yellow, tan, gray, green or brown) and/or eschar (tan, brown or black) in the wound bed. 0.5 x 0.5cms 100% yellowslough (Active)  02/16/23 0800  Location: Coccyx  Location Orientation: Medial  Staging: Unstageable - Full thickness tissue loss in which the base of the injury is covered by slough (yellow, tan, gray, green or brown) and/or eschar (tan, brown or black) in the wound bed.  Wound Description (Comments): 0.5 x 0.5cms 100% yellowslough  Present on Admission: No  Dressing Type Foam - Lift dressing to assess site every shift 02/26/23 0835     DVT prophylaxis: scd Family Communication:sin in law Status is: Inpatient Remains inpatient appropriate because: Devastating large left hemispheric parietal infarct    Code Status:     Code Status Orders  (From admission, onward)           Start     Ordered   11/30/22 1451  Full code  Continuous       Question:  By:  Answer:  Consent: discussion documented in EHR   11/30/22 1450           Code Status History     Date Active Date Inactive Code Status Order ID Comments User Context   11/30/2022 0145 11/30/2022 1450 DNR [id number]  Maryjane Hurter, MD ED  11/29/2022 2218 11/30/2022 0144 Full Code [id number]  Greta Doom, MD ED      Advance Directive Documentation    Flowsheet Row Most Recent Value  Type of Advance Directive Healthcare Power of Attorney  Pre-existing out of facility DNR order (yellow form or pink MOST form) --  "MOST" Form in Place? --         IV Access:   Peripheral IV   Procedures and diagnostic studies:   No results found.   Medical Consultants:    None.   Subjective:    ZIASIA VONGPHACHANH nonverbal.  Objective:    Vitals:   02/26/23 2032 02/26/23 2317 02/26/23 2352 02/27/23 0310  BP:  (!) 104/53  (!) 109/52  Pulse:  (!) 103  (!) 108  Resp:  (!) 27  (!) 27  Temp:  99.8 F (37.7 C)  99 F (37.2 C)  TempSrc:  Axillary  Axillary  SpO2: 97% 94% 96% 95%  Weight:      Height:       SpO2: 95 % O2 Flow Rate (L/min): 6 L/min FiO2 (%): 28 %   Intake/Output Summary (Last 24 hours) at 02/27/2023 0729 Last data filed at 02/27/2023 0600 Gross per 24 hour  Intake 1320 ml  Output 1150 ml  Net 170 ml    Filed Weights   02/21/23 0713 02/25/23 0732 02/26/23 0500  Weight: 86 kg 86 kg 86 kg    Exam: General exam: In no acute distress, only responsive to pain Respiratory system: Good air movement and clear to auscultation. Cardiovascular system: S1 & S2 heard, RRR. No JVD,. Gastrointestinal system: Abdomen is nondistended, soft and nontender.  Extremities: No pedal edema. Skin: No rashes, lesions or ulcers Data Reviewed:    Labs: Basic Metabolic Panel: No results for input(s): "NA", "K", "CL", "CO2", "GLUCOSE", "BUN", "CREATININE", "CALCIUM", "MG", "PHOS" in the last 168 hours.  GFR Estimated Creatinine Clearance: 56.8 mL/min (by C-G formula based on SCr of 0.48 mg/dL). Liver Function Tests: No results for input(s): "AST", "ALT", "ALKPHOS", "BILITOT", "PROT", "ALBUMIN" in the last 168 hours.  No results for input(s): "LIPASE", "AMYLASE" in the last 168 hours. No results for input(s): "AMMONIA" in the last 168 hours. Coagulation profile No results for input(s): "INR", "PROTIME" in the last 168 hours. COVID-19 Labs  No results for input(s): "DDIMER", "FERRITIN", "LDH", "CRP" in the last 72 hours.  No results found for: "SARSCOV2NAA"  CBC: No results for input(s): "WBC", "NEUTROABS", "HGB", "HCT", "MCV", "PLT" in the last 168 hours.  Cardiac Enzymes: No results for input(s): "CKTOTAL", "CKMB", "CKMBINDEX",  "TROPONINI" in the last 168 hours. BNP (last 3 results) No results for input(s): "PROBNP" in the last 8760 hours. CBG: No results for input(s): "GLUCAP" in the last 168 hours. D-Dimer: No results for input(s): "DDIMER" in the last 72 hours. Hgb A1c: No results for input(s): "HGBA1C" in the last 72 hours. Lipid Profile: No results for input(s): "CHOL", "HDL", "LDLCALC", "TRIG", "CHOLHDL", "LDLDIRECT" in the last 72 hours. Thyroid function studies: No results for input(s): "TSH", "T4TOTAL", "T3FREE", "THYROIDAB" in the last 72 hours.  Invalid input(s): "FREET3" Anemia work up: No results for input(s): "VITAMINB12", "FOLATE", "FERRITIN", "TIBC", "IRON", "RETICCTPCT" in the last 72 hours. Sepsis Labs: No results for input(s): "PROCALCITON", "WBC", "LATICACIDVEN" in the last 168 hours.  Microbiology No results found for this or any previous visit (from the past 240 hour(s)).   Medications:    Chlorhexidine Gluconate Cloth  6 each Topical Daily   famotidine  20 mg Per Tube QHS   feeding supplement (PROSource TF20)  60 mL Per Tube Daily   fiber supplement (BANATROL TF)  60 mL Per Tube BID   Gerhardt's butt cream   Topical QID   latanoprost  1 drop Both Eyes QHS   levETIRAcetam  500 mg Per Tube BID   metoprolol tartrate  25 mg Per Tube BID   nutrition supplement (JUVEN)  1 packet Per Tube BID BM   mouth rinse  15 mL Mouth Rinse Q2H   phenylephrine-shark liver oil-mineral oil-petrolatum  1 Application Rectal BID   rosuvastatin  20 mg Per Tube Daily   Continuous Infusions:  sodium chloride Stopped (01/01/23 0424)   feeding supplement (JEVITY 1.2 CAL) 55 mL/hr at 02/26/23 1903      LOS: 72 days   Charlynne Cousins  [facility]  02/27/2023, 7:29 AM

## 2023-02-27 NOTE — Progress Notes (Addendum)
Patient's insurance authorization has been approved. - next review date is 03/01/23.  CSW spoke with Loree Fee at [facility] who states patient can be transferred to the facility today.  CSW spoke with patient's husband [name] to inform him of discharge plan. [name] states he is in agreement with this discharge plan. [name] states he will inform his daughter Ebony Hail and son in law Shanon Brow of discharge plan.  CSW spoke with RN and MD to inform them of discharge plan.  The number to call for report is [phone]. CSW called [facility] for transportation.  Madilyn Fireman, MSW, LCSW Transitions of Care  Clinical Social Worker II [phone]

## 2023-02-27 NOTE — Discharge Summary (Signed)
Physician Discharge Summary  Pamela Johnston [id number] DOB: 1941-09-20 DOA: 11/29/2022  PCP: [name], MD  Admit date: 11/29/2022 Discharge date: 02/27/2023  Admitted From: Home Disposition:  SNF  Recommendations for Outpatient Follow-up:  Please obtain BMP/CBC in one week   Home Health:No Equipment/Devices:None  Discharge Condition:Guarded CODE STATUS:Full Diet recommendation: Heart Healthy   Brief/Interim Summary: 82 y.o. 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  Neurology and neurosurgery was consulted she was intubated and admitted to the neuro ICU started on 3% saline palliative care was consulted due to her poor prognosis.  Underwent tracheostomy as well as PEG tube placement she was also started on Keppra for concerns of seizures.  She is currently on trach collar, palliative care has had multiple discussions with the family at 1 point hospital leadership was involved she remains a full code.  Clearly if she could be placed is a month out from her tracheostomy on 01/21/2023, now in the difficult to place list.   Significant Events: 12/21 - Presented to [facility] ED via EMS as Code Stroke. LKW 2000. Abrupt onset of decreased responsiveness, aphasia and R hemiplegia. Stroke admitting, NSGY consulted. PCCM consult for vent management. Treated with unasyn for aspiration pneumonia. CT Head with large L hemispheric parietal/temporal lobe IPH with extension into basal ganglia and IVH. HTS 3% started.   12/24 - Palliative care consulted, family requested meeting to be  delayed 12/25 - Family requested goals of care meeting to be delayed 12/26 - Stopped Unasyn, replete phos; [name] GOC conversation: family says full code 12/27 - Restarted Unasyn for fever, RLL infiltrate; resp culture: enterobacter, MDR. Palliative consulted  12/28 - CT Head unchanged 12/29 - Unasyn changed to bactrim 12/31 - Posturing, CT head with increased shift, increased bleeding 1/2 - Family fired CVA service after discussion RE poor prognosis  1/4 - Off sedation  1/5 - No acute issues overnight. Remains on vent with increased respirations off sedation. Fever curve slightly decreased  1/6 - Pall care meeting moved to 2pm today. [name] the son-in-law will be present. On ven 30%/ On TF. Neo not started last night. MAP > 65 after fluids bolus. Febrile +.  Last CT head 12/09/22.  1/8 - No significant neurologic/clinical change. Brief GOC discussion with husband, [name], at bedside in AM. Concerns from staff re: patient GOC/decisionmaking, want to ensure husband is included in decisions/information sharing. Attempted bedside meeting but family prematurely ended meeting due to escalated discussion. 1/9 - Febrile overnight to 101.29F, remains on bromocriptine. No significant neurologic exam changes. 1/10 Family meeting yesterday with [name], plan for trach/peg though husband says they are still discussing this morning. Per Dt. Hulen Skains " So moving forward, [name] is the designated medical decision maker for [name] " 1/11 no new issues, plan for PEG tomorrow 1/12 for trach/peg today 1/15 No change in Neuro status , trach and PEG, No weaning , will repeat CT head without, get Case management involved in looking for LTAC ( Day 3 post trach on 1/12)   1/15-CT with some stabilization 1/19 No acute events overnight, respiratory culture 1/15 with abundant Pseudomonas, abundant Staphylococcus  aureus, and moderate Enterobacter remains on Meropenem  1/22 trach collar trial 12 + hrs 1/24 trach  collar x 24 hours on 35% 1/25- concern for seizures-- keppra started after EEG done 2/12- concern for seizures-- EEG reordered, negative 2/19 -PCCM saw for trach maintenance 2/25: ? Aspiration event with PNA 2/28-worsening fever curves, requiring cooling blanket, white count started to increase, ID consulted due to multiresistant Pseudomonas.  Started on 7-day of Zerbaxa - end date 3/6    Discharge Diagnoses:  Principal Problem:   ICH (intracerebral hemorrhage) ([place]) Active Problems:   RLS (restless legs syndrome)   Osteopenia   Sacroiliitis (HCC)   Benign essential HTN   Aspiration into airway   Hypoxia   Intraparenchymal hemorrhage of brain (HCC)   Brain compression (HCC)   Acute respiratory failure with hypoxia (HCC)   Pneumonia due to Enterobacter aerogenes (HCC)   Goals of care, counseling/discussion   Pressure injury of skin   Tracheostomy status ([place])   Fever   Tracheostomy dependence ([place])  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/mass effect/brain compression/ICH of the right parietal lobe/severe encephalopathy/intraventricular hemorrhage with mass effect and subfalcine herniation: Patient remains a very poor prognosis.   Medical decision making is [name] patient's son-in-law. Remains a full code, neurology was consulted. She is at high risk of aspiration. She has no chance of recovery.   Fevers of unknown source: On 12/07/2023 she developed worsening fevers and leukocytosis. ID was consulted recommended 7 days course of Zerbaxa completed on 02/13/2023.   Concerns for seizures: On Keppra underwent EEG on 11/21/2023 due to left-sided rhythmic movements. EEG showed no seizures neurology has now signed off. No events.   Acute respiratory failure with hypoxia due to large intracerebral hemorrhage: Status post trach and PEG on 12/21/2022. Currently satting greater 97% on trach collar FiO2 of 28 with a flow rate of 6L.    Sinus tachycardia: Fluctuates, has episodes of sinus tach and short episodes of sinus tachycardia. Has remained afebrile.  No leukocytosis.   Urinary retention: Foley placed on 01/01/2023 will need urologic evaluation as an outpatient.   History of chronic sacroiliitis/osteopenia/RLS: Continue supportive care.  Discharge Instructions  Discharge Instructions     Diet - low sodium heart healthy   Complete by: As directed    Increase activity slowly   Complete by: As directed    No wound care   Complete by: As directed       Allergies as of 02/27/2023       Reactions   Nickel Other (See Comments)   Verified by Allergist   Penicillin G Other (See Comments)   Unknown reaction Tolerated Unasyn 11/2022.  TDD.        Medication List     STOP taking these medications    oxyCODONE-acetaminophen 5-325 MG tablet Commonly known as: PERCOCET/ROXICET   pramipexole 0.25 MG tablet Commonly known as: MIRAPEX       TAKE these medications    acetaminophen 160 MG/5ML solution Commonly known as: TYLENOL Place 20.3 mLs (650 mg total) into feeding tube every 4 (four) hours as needed for mild pain (or temp > 37.5 C (99.5 F)).   docusate 50 MG/5ML liquid Commonly known as: COLACE Place 10 mLs (100 mg total) into feeding tube 2 (two) times daily as needed for mild constipation.   famotidine 40 MG/5ML suspension Commonly known as: PEPCID Place 2.5 mLs (20 mg total) into feeding tube at bedtime.   feeding supplement (JEVITY 1.2 CAL) Liqd Place  1,000 mLs into feeding tube continuous.   nutrition supplement (JUVEN) Pack Place 1 packet into feeding tube 2 (two) times daily between meals.   feeding supplement (PROSource TF20) liquid Place 60 mLs into feeding tube daily. Start taking on: February 28, 2023   fiber supplement (BANATROL TF) liquid Place 60 mLs into feeding tube 2 (two) times daily.   Gerhardt's butt cream Crea Apply 1 Application topically 4 (four) times daily.    latanoprost 0.005 % ophthalmic solution Commonly known as: XALATAN Place 1 drop into both eyes at bedtime.   levETIRAcetam 500 MG tablet Commonly known as: KEPPRA Place 1 tablet (500 mg total) into feeding tube 2 (two) times daily.   metoprolol tartrate 25 MG tablet Commonly known as: LOPRESSOR Place 1 tablet (25 mg total) into feeding tube 2 (two) times daily.   phenylephrine-shark liver oil-mineral oil-petrolatum 0.25-14-74.9 % rectal ointment Commonly known as: PREPARATION H Place 1 Application rectally 2 (two) times daily.   rosuvastatin 20 MG tablet Commonly known as: CRESTOR Place 1 tablet (20 mg total) into feeding tube daily. Start taking on: February 28, 2023        Allergies  Allergen Reactions   Nickel Other (See Comments)    Verified by Allergist   Penicillin G Other (See Comments)    Unknown reaction Tolerated Unasyn 11/2022.  TDD.    Consultations: Pulmonary and critical care Neurology Infectious disease   Procedures/Studies: DG CHEST PORT 1 VIEW  Result Date: 02/24/2023 CLINICAL DATA:  Increased breath sounds in the bases EXAM: PORTABLE CHEST 1 VIEW COMPARISON:  02/16/2023 FINDINGS: Cardiac shadow is stable. Tracheostomy tube is again seen. The overall inspiratory effort is stable. Bibasilar atelectasis is noted right slightly greater than left increased from the prior exam. No bone abnormality is noted. IMPRESSION: Increasing bibasilar atelectasis Electronically Signed   By: Inez Catalina M.D.   On: 02/24/2023 12:05   DG CHEST PORT 1 VIEW  Result Date: 02/16/2023 CLINICAL DATA:  Acute respiratory failure with hypoxemia EXAM: PORTABLE CHEST 1 VIEW COMPARISON:  02/03/2023 FINDINGS: Unchanged tracheostomy. Stable cardiomediastinal silhouette. Aortic atherosclerotic calcification. Increased left basilar atelectasis or infiltrates. Similar right basilar atelectasis. Chronic interstitial coarsening. Low lung volumes. No definite pleural effusion. No pneumothorax.  No displaced rib fractures. IMPRESSION: Low lung volumes with increased left basilar atelectasis or infiltrates. Similar right basilar atelectasis. Electronically Signed   By: Placido Sou M.D.   On: 02/16/2023 21:35   DG CHEST PORT 1 VIEW  Result Date: 02/03/2023 CLINICAL DATA:  Tachypnea. EXAM: PORTABLE CHEST 1 VIEW COMPARISON:  Most recent radiograph 01/12/2023 FINDINGS: Tracheostomy tube tip projects over the thoracic inlet. Lower lung volumes from prior exam. Mild right greater than left basilar atelectasis. Stable heart size and mediastinal contours. Chronic interstitial coarsening. No significant pleural effusion. No pneumothorax. No pulmonary edema. Stable osseous structures. IMPRESSION: Lower lung volumes from prior exam with bibasilar atelectasis. Electronically Signed   By: Keith Rake M.D.   On: 02/03/2023 17:35   (Echo, Carotid, EGD, Colonoscopy, ERCP)    Subjective: Nonverbal  Discharge Exam: Vitals:   02/27/23 0756 02/27/23 0857  BP: 137/78 137/78  Pulse: (!) 114 (!) 119  Resp: (!) 24 (!) 33  Temp: 99 F (37.2 C)   SpO2: 97% 96%   Vitals:   02/27/23 0310 02/27/23 0704 02/27/23 0756 02/27/23 0857  BP: (!) 109/52  137/78 137/78  Pulse: (!) 108  (!) 114 (!) 119  Resp: (!) 27  (!) 24 (!) 33  Temp: 99 F (  37.2 C)  99 F (37.2 C)   TempSrc: Axillary  Axillary   SpO2: 95%  97% 96%  Weight:  86 kg    Height:        General: Pt is alert, awake, not in acute distress Cardiovascular: RRR, S1/S2 +, no rubs, no gallops Respiratory: CTA bilaterally, no wheezing, no rhonchi Abdominal: Soft, NT, ND, bowel sounds + Extremities: no edema, no cyanosis    The results of significant diagnostics from this hospitalization (including imaging, microbiology, ancillary and laboratory) are listed below for reference.     Microbiology: No results found for this or any previous visit (from the past 240 hour(s)).   Labs: BNP (last 3 results) No results for input(s): "BNP"  in the last 8760 hours. Basic Metabolic Panel: No results for input(s): "NA", "K", "CL", "CO2", "GLUCOSE", "BUN", "CREATININE", "CALCIUM", "MG", "PHOS" in the last 168 hours. Liver Function Tests: No results for input(s): "AST", "ALT", "ALKPHOS", "BILITOT", "PROT", "ALBUMIN" in the last 168 hours. No results for input(s): "LIPASE", "AMYLASE" in the last 168 hours. No results for input(s): "AMMONIA" in the last 168 hours. CBC: No results for input(s): "WBC", "NEUTROABS", "HGB", "HCT", "MCV", "PLT" in the last 168 hours. Cardiac Enzymes: No results for input(s): "CKTOTAL", "CKMB", "CKMBINDEX", "TROPONINI" in the last 168 hours. BNP: Invalid input(s): "POCBNP" CBG: No results for input(s): "GLUCAP" in the last 168 hours. D-Dimer No results for input(s): "DDIMER" in the last 72 hours. Hgb A1c No results for input(s): "HGBA1C" in the last 72 hours. Lipid Profile No results for input(s): "CHOL", "HDL", "LDLCALC", "TRIG", "CHOLHDL", "LDLDIRECT" in the last 72 hours. Thyroid function studies No results for input(s): "TSH", "T4TOTAL", "T3FREE", "THYROIDAB" in the last 72 hours.  Invalid input(s): "FREET3" Anemia work up No results for input(s): "VITAMINB12", "FOLATE", "FERRITIN", "TIBC", "IRON", "RETICCTPCT" in the last 72 hours. Urinalysis    Component Value Date/Time   COLORURINE YELLOW 01/31/2023 0828   APPEARANCEUR TURBID (A) 01/31/2023 0828   LABSPEC 1.020 01/31/2023 0828   PHURINE 6.5 01/31/2023 0828   GLUCOSEU NEGATIVE 01/31/2023 0828   HGBUR LARGE (A) 01/31/2023 0828   BILIRUBINUR NEGATIVE 01/31/2023 0828   KETONESUR NEGATIVE 01/31/2023 0828   PROTEINUR NEGATIVE 01/31/2023 0828   NITRITE POSITIVE (A) 01/31/2023 0828   LEUKOCYTESUR MODERATE (A) 01/31/2023 0828   Sepsis Labs No results for input(s): "WBC" in the last 168 hours.  Invalid input(s): "PROCALCITONIN", "LACTICIDVEN" Microbiology No results found for this or any previous visit (from the past 240  hour(s)).    SIGNED:   Charlynne Cousins, MD  [facility] 02/27/2023, 9:50 AM Pager   If 7PM-7AM, please contact night-coverage [email or web address] Password [email or web address]

## 2023-02-27 NOTE — Progress Notes (Incomplete Revision)
2:40pm: CSW received a voicemail from patient's daughter Ebony Hail requesting a return call.  CSW spoke with [name] to obtain permission for [facility] supervisor to return Allison's call - [name] provided verbal permission to proceed with calling Ebony Hail. [name] states he has relaed al  2pm: CSW spoke with [name] to inform him that patient had left the hospital and was in route to [facility]. [name] states he intends to visit her first thing tomorrow morning. [name] confirms he informed daughter Ebony Hail of discharge plan.  9am: Patient's insurance authorization has been approved. - next review date is 03/01/23.  CSW spoke with Loree Fee at [facility] who states patient can be transferred to the facility today.  CSW spoke with patient's husband [name] to inform him of discharge plan. [name] states he is in agreement with this discharge plan. [name] states he will inform his daughter Ebony Hail and son in law Shanon Brow of discharge plan.  CSW spoke with RN and MD to inform them of discharge plan.  The number to call for report is [phone]. CSW called [facility] for transportation.  Madilyn Fireman, MSW, LCSW Transitions of Care  Clinical Social Worker II [phone]

## 2023-04-23 ENCOUNTER — Inpatient Hospital Stay: Admission: RE | Admit: 2023-04-23 | Payer: Medicare PPO | Source: Ambulatory Visit

## 2024-04-09 DEATH — deceased
